# Patient Record
Sex: Male | Born: 1955
Health system: Southern US, Community
[De-identification: ages and names within clinical notes are randomized; demographics above are authoritative.]

## PROBLEM LIST (undated history)

## (undated) DIAGNOSIS — G2581 Restless legs syndrome: Secondary | ICD-10-CM

## (undated) DIAGNOSIS — D689 Coagulation defect, unspecified: Secondary | ICD-10-CM

## (undated) DIAGNOSIS — K219 Gastro-esophageal reflux disease without esophagitis: Secondary | ICD-10-CM

## (undated) DIAGNOSIS — I2510 Atherosclerotic heart disease of native coronary artery without angina pectoris: Secondary | ICD-10-CM

## (undated) DIAGNOSIS — E785 Hyperlipidemia, unspecified: Secondary | ICD-10-CM

## (undated) DIAGNOSIS — D6800 Von Willebrand disease, unspecified: Secondary | ICD-10-CM

## (undated) DIAGNOSIS — T7840XA Allergy, unspecified, initial encounter: Secondary | ICD-10-CM

## (undated) DIAGNOSIS — H905 Unspecified sensorineural hearing loss: Secondary | ICD-10-CM

## (undated) DIAGNOSIS — D649 Anemia, unspecified: Secondary | ICD-10-CM

## (undated) DIAGNOSIS — F419 Anxiety disorder, unspecified: Secondary | ICD-10-CM

## (undated) DIAGNOSIS — I10 Essential (primary) hypertension: Secondary | ICD-10-CM

## (undated) DIAGNOSIS — C44311 Basal cell carcinoma of skin of nose: Secondary | ICD-10-CM

## (undated) DIAGNOSIS — Z8719 Personal history of other diseases of the digestive system: Secondary | ICD-10-CM

## (undated) DIAGNOSIS — G4700 Insomnia, unspecified: Secondary | ICD-10-CM

## (undated) HISTORY — DX: Restless legs syndrome: G25.81

## (undated) HISTORY — DX: Hyperlipidemia, unspecified: E78.5

## (undated) HISTORY — DX: Insomnia, unspecified: G47.00

## (undated) HISTORY — DX: Coagulation defect, unspecified: D68.9

## (undated) HISTORY — DX: Atherosclerotic heart disease of native coronary artery without angina pectoris: I25.10

## (undated) HISTORY — PX: COLONOSCOPY: SHX174

## (undated) HISTORY — PX: CIRCUMCISION: SUR203

## (undated) HISTORY — DX: Allergy, unspecified, initial encounter: T78.40XA

## (undated) HISTORY — DX: Anxiety disorder, unspecified: F41.9

## (undated) HISTORY — DX: Unspecified sensorineural hearing loss: H90.5

## (undated) HISTORY — DX: Basal cell carcinoma of skin of nose: C44.311

## (undated) HISTORY — PX: HERNIA REPAIR: SHX51

## (undated) HISTORY — PX: FOOT SURGERY: SHX648

---

## 2000-12-29 ENCOUNTER — Ambulatory Visit (HOSPITAL_COMMUNITY): Admission: RE | Admit: 2000-12-29 | Discharge: 2000-12-29 | Payer: Self-pay | Admitting: Family Medicine

## 2003-07-24 ENCOUNTER — Emergency Department (HOSPITAL_COMMUNITY): Admission: EM | Admit: 2003-07-24 | Discharge: 2003-07-25 | Payer: Self-pay | Admitting: Emergency Medicine

## 2004-02-29 ENCOUNTER — Emergency Department: Payer: Self-pay | Admitting: Unknown Physician Specialty

## 2006-01-02 ENCOUNTER — Ambulatory Visit: Payer: Self-pay | Admitting: Family Medicine

## 2006-05-01 ENCOUNTER — Ambulatory Visit: Payer: Self-pay | Admitting: Family Medicine

## 2006-05-01 LAB — CONVERTED CEMR LAB
Alkaline Phosphatase: 116 units/L (ref 39–117)
Bilirubin, Direct: 0.1 mg/dL (ref 0.0–0.3)
CO2: 30 meq/L (ref 19–32)
Cholesterol: 174 mg/dL (ref 0–200)
Creatinine, Ser: 1 mg/dL (ref 0.4–1.5)
GFR calc Af Amer: 101 mL/min
LDL Cholesterol: 111 mg/dL — ABNORMAL HIGH (ref 0–99)
PSA: 0.81 ng/mL (ref 0.10–4.00)
Potassium: 4.2 meq/L (ref 3.5–5.1)
Sodium: 139 meq/L (ref 135–145)
TSH: 1.99 microintl units/mL (ref 0.35–5.50)
Total Bilirubin: 0.9 mg/dL (ref 0.3–1.2)
Total Protein: 6.4 g/dL (ref 6.0–8.3)
VLDL: 31 mg/dL (ref 0–40)

## 2006-05-05 ENCOUNTER — Ambulatory Visit: Payer: Self-pay | Admitting: Family Medicine

## 2006-05-20 ENCOUNTER — Ambulatory Visit: Payer: Self-pay | Admitting: Family Medicine

## 2008-02-12 LAB — HM COLONOSCOPY

## 2008-12-07 ENCOUNTER — Encounter: Admission: RE | Admit: 2008-12-07 | Discharge: 2008-12-07 | Payer: Self-pay | Admitting: Internal Medicine

## 2010-04-01 ENCOUNTER — Encounter: Payer: Self-pay | Admitting: Internal Medicine

## 2010-04-02 ENCOUNTER — Encounter: Payer: Self-pay | Admitting: Internal Medicine

## 2010-06-13 ENCOUNTER — Emergency Department (HOSPITAL_COMMUNITY)
Admission: EM | Admit: 2010-06-13 | Discharge: 2010-06-13 | Disposition: A | Discharge status: Home / Self Care | Payer: No Typology Code available for payment source | Attending: Emergency Medicine | Admitting: Emergency Medicine

## 2010-06-13 ENCOUNTER — Emergency Department (HOSPITAL_COMMUNITY): Payer: No Typology Code available for payment source

## 2010-06-13 DIAGNOSIS — M542 Cervicalgia: Secondary | ICD-10-CM | POA: Insufficient documentation

## 2010-06-13 DIAGNOSIS — M549 Dorsalgia, unspecified: Secondary | ICD-10-CM | POA: Insufficient documentation

## 2010-06-13 DIAGNOSIS — S335XXA Sprain of ligaments of lumbar spine, initial encounter: Secondary | ICD-10-CM | POA: Insufficient documentation

## 2010-06-13 DIAGNOSIS — S139XXA Sprain of joints and ligaments of unspecified parts of neck, initial encounter: Secondary | ICD-10-CM | POA: Insufficient documentation

## 2010-06-13 DIAGNOSIS — K219 Gastro-esophageal reflux disease without esophagitis: Secondary | ICD-10-CM | POA: Insufficient documentation

## 2010-06-13 DIAGNOSIS — I10 Essential (primary) hypertension: Secondary | ICD-10-CM | POA: Insufficient documentation

## 2010-07-27 NOTE — Consult Note (Signed)
NAME:  Grant [name], Grant [name] NO.:  [id number]   MEDICAL RECORD NO.:  [id number]                   PATIENT TYPE:  EMS   LOCATION:  [facility]                                 FACILITY:  [facility]   PHYSICIAN:  Titus Dubin. Alwyn Ren, M.D. [facility]         DATE OF BIRTH:  1955/06/12   DATE OF CONSULTATION:  07/25/2003  DATE OF DISCHARGE:                                   CONSULTATION   REFERRING PHYSICIAN:  Dr. Hassan Buckler. Caporossi.   REASON FOR CONSULTATION:  Mr. Voiles is a 55 year old white male seen in  consultation for gastroenteritis symptoms.   On the evening of Jul 20, 2003, he became queasy approximately 8 p.m.  On  the 12th, he had nausea and vomiting x1.  He states that it was profound and  emptied my system.  He is unsure whether he had fever, but he did feel hot  and cold.  On Jul 22, 2003, he stuck his finger down his throat to vomit  because of the profound nausea.  He has had no vomiting since that time and  there have only been 2 episodes of vomiting.  He has had gas and watery  diarrheal stools but no bowel movement since Jul 24, 2003 at lunch.  Unfortunately, he treated his symptoms with Maalox with 3 doses over the  span of Jul 22, 2003 to Jul 23, 2003 for dyspepsia.  He also took over-the-  counter Tylenol.   He does have some dyspnea intermittently and takes Pepcid a.c.  He has  residual soreness at this time, which is positional.  If he is in the  lateral decubitus position, the pain is localized; if he is on his back, it  is diffuse.   He questioned food poisoning.  He ate breakfast at [facility] on Jul 20, 2003 at approximately 6:30, ingestion of a breakfast burrito.  He is unsure  of what he ate at lunch at work; it may have been salad and/or pizza; he  works at [employer].  He ate at [facility] at 6 p.m.,  ingesting catfish, shrimp and [employer].  His symptoms began approximately 2  hours after his evening meal, Jul 20, 2003.   HABITS:  He does not drink or smoke.   ALLERGIES:  He has no known drug allergies.   SOCIAL HISTORY:  He lives with his wife, who is well.  No other family or  friends are sick.  He has no significant travel exposures and has no sick  pets.  He has taken no antibiotics in the last 6 weeks.  He does have well  water, which he drinks occasionally.   PAST MEDICAL HISTORY:  Past medical history includes post-circumcision  bleeding while stationed in [place].  He was hospitalized at age 55 with a  diarrheal illness.   FAMILY HISTORY:  Family history is noncontributory.   REVIEW  OF SYSTEMS:  He has had some dull nonspecific headache with this  illness.   He has had a nonproductive cough.  He has had no genitourinary symptoms.   PHYSICAL EXAMINATION:  GENERAL/VITAL SIGNS:  At this time, he is resting  quietly with a temperature of 98, respiratory rate of 14 and pulse of 65.  Initially, his blood pressure was 140/95 but on recheck, was 113/79.  O2  saturations were 95% on room air.  HEENT:  He has no scleral icterus and there is no jaundice.  Tissue turgor  is good.  Oral mucosa is well-hydrated.  ENT exam is unremarkable.  NODES:  He had no lymphadenopathy about the head, neck or axilla.  He was  minimally tender in the left axilla but no lymphadenopathy was palpable.  CHEST:  Chest is clear.  CARDIAC:  He has a slow S4 with a grade 1/2 systolic murmur.  ABDOMEN:  Bowel sounds are present.  There is no guarding but he is slightly  tender diffusely.  EXTREMITIES:  Pedal pulses are intact.   LABORATORY DATA:  BMET and CBC are normal; his hematocrit is low-normal at  39.7.   ASSESSMENT AND PLAN:  The time interval does not suggest food poisoning and  the working diagnosis would be gastroenteritis.   CAT scan of the abdomen and pelvis will be performed because of the  tenderness to palpation.  If this is negative, then he will be discharged on  clear liquids with Phenergan  suppositories for nausea and Nexium 40 mg each  morning.  Stool for ova and parasite, Salmonella and Shigella will also be  collected.  Clostridium difficile is not likely, due to the fact that he has  not had antibiotics.   He will also be given Lomotil as needed for diarrhea.   He has not seen Dr. Hetty Ely for over 2 years and was only able to identify  him after I mentioned the name.  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                                               Titus Dubin. Alwyn Ren, M.D. [facility]    [facility]/MED[facility]  D:  07/25/2003  T:  07/25/2003  Job:  [id number]   cc:   Laurita Quint, M.D.  [address] [place]  [place] [address]  Fax: [phone]

## 2013-02-04 ENCOUNTER — Encounter (HOSPITAL_COMMUNITY): Admitting: Critical Care Medicine

## 2013-02-04 ENCOUNTER — Encounter (HOSPITAL_COMMUNITY): Payer: Self-pay | Admitting: Emergency Medicine

## 2013-02-04 ENCOUNTER — Emergency Department (HOSPITAL_COMMUNITY): Admitting: Critical Care Medicine

## 2013-02-04 ENCOUNTER — Encounter (HOSPITAL_COMMUNITY)
Admission: EM | Disposition: A | Discharge status: Home / Self Care | Payer: Self-pay | Source: Home / Self Care | Attending: Emergency Medicine

## 2013-02-04 ENCOUNTER — Emergency Department (HOSPITAL_COMMUNITY)
Admission: EM | Admit: 2013-02-04 | Discharge: 2013-02-04 | Disposition: A | Discharge status: Home / Self Care | Attending: Emergency Medicine | Admitting: Emergency Medicine

## 2013-02-04 ENCOUNTER — Emergency Department (HOSPITAL_COMMUNITY)

## 2013-02-04 DIAGNOSIS — S62309B Unspecified fracture of unspecified metacarpal bone, initial encounter for open fracture: Secondary | ICD-10-CM

## 2013-02-04 DIAGNOSIS — Y33XXXA Other specified events, undetermined intent, initial encounter: Secondary | ICD-10-CM | POA: Insufficient documentation

## 2013-02-04 DIAGNOSIS — S62329B Displaced fracture of shaft of unspecified metacarpal bone, initial encounter for open fracture: Secondary | ICD-10-CM | POA: Insufficient documentation

## 2013-02-04 DIAGNOSIS — S62318B Displaced fracture of base of other metacarpal bone, initial encounter for open fracture: Secondary | ICD-10-CM | POA: Insufficient documentation

## 2013-02-04 DIAGNOSIS — I10 Essential (primary) hypertension: Secondary | ICD-10-CM | POA: Insufficient documentation

## 2013-02-04 HISTORY — DX: Essential (primary) hypertension: I10

## 2013-02-04 HISTORY — PX: INCISION AND DRAINAGE: SHX5863

## 2013-02-04 HISTORY — DX: Gastro-esophageal reflux disease without esophagitis: K21.9

## 2013-02-04 HISTORY — PX: OPEN REDUCTION INTERNAL FIXATION (ORIF) FINGER WITH RADIAL BONE GRAFT: SHX5666

## 2013-02-04 LAB — CBC WITH DIFFERENTIAL/PLATELET
Basophils Absolute: 0 10*3/uL (ref 0.0–0.1)
Basophils Relative: 0 % (ref 0–1)
Lymphocytes Relative: 26 % (ref 12–46)
MCHC: 35.3 g/dL (ref 30.0–36.0)
Monocytes Absolute: 0.5 10*3/uL (ref 0.1–1.0)
Neutro Abs: 4 10*3/uL (ref 1.7–7.7)
Neutrophils Relative %: 63 % (ref 43–77)
Platelets: 246 10*3/uL (ref 150–400)
RDW: 12.3 % (ref 11.5–15.5)
WBC: 6.4 10*3/uL (ref 4.0–10.5)

## 2013-02-04 LAB — BASIC METABOLIC PANEL
CO2: 25 mEq/L (ref 19–32)
Chloride: 103 mEq/L (ref 96–112)
Creatinine, Ser: 0.81 mg/dL (ref 0.50–1.35)
GFR calc Af Amer: >90 mL/min (ref >90)
Potassium: 3.3 mEq/L — ABNORMAL LOW (ref 3.5–5.1)
Sodium: 140 mEq/L (ref 135–145)

## 2013-02-04 SURGERY — OPEN REDUCTION INTERNAL FIXATION (ORIF) FINGER WITH RADIAL BONE GRAFT
Anesthesia: General | Site: Hand | Laterality: Right | Wound class: Clean

## 2013-02-04 MED ORDER — HYDROMORPHONE HCL PF 1 MG/ML IJ SOLN
INTRAMUSCULAR | Status: AC
  Administered 2013-02-04: 0.5 mg via INTRAVENOUS
  Filled 2013-02-04: qty 1

## 2013-02-04 MED ORDER — FENTANYL CITRATE 0.05 MG/ML IJ SOLN
INTRAMUSCULAR | Status: DC | PRN
  Administered 2013-02-04 (×4): 50 ug via INTRAVENOUS
  Administered 2013-02-04 (×2): 25 ug via INTRAVENOUS

## 2013-02-04 MED ORDER — TETANUS-DIPHTH-ACELL PERTUSSIS 5-2.5-18.5 LF-MCG/0.5 IM SUSP
0.5000 mL | Freq: Once | INTRAMUSCULAR | Status: AC
  Administered 2013-02-04: 0.5 mL via INTRAMUSCULAR
  Filled 2013-02-04: qty 0.5

## 2013-02-04 MED ORDER — SUCCINYLCHOLINE CHLORIDE 20 MG/ML IJ SOLN
INTRAMUSCULAR | Status: DC | PRN
  Administered 2013-02-04: 120 mg via INTRAVENOUS

## 2013-02-04 MED ORDER — PROPOFOL 10 MG/ML IV BOLUS
INTRAVENOUS | Status: DC | PRN
  Administered 2013-02-04: 200 mg via INTRAVENOUS

## 2013-02-04 MED ORDER — OXYCODONE-ACETAMINOPHEN 10-325 MG PO TABS: 1.0000 | ORAL_TABLET | ORAL | Status: DC | PRN

## 2013-02-04 MED ORDER — LACTATED RINGERS IV SOLN
INTRAVENOUS | Status: DC | PRN
  Administered 2013-02-04 (×2): via INTRAVENOUS

## 2013-02-04 MED ORDER — GLYCOPYRROLATE 0.2 MG/ML IJ SOLN
INTRAMUSCULAR | Status: DC | PRN
  Administered 2013-02-04 (×2): 0.2 mg via INTRAVENOUS
  Administered 2013-02-04 (×2): 0.1 mg via INTRAVENOUS

## 2013-02-04 MED ORDER — ONDANSETRON HCL 4 MG/2ML IJ SOLN
INTRAMUSCULAR | Status: DC | PRN
  Administered 2013-02-04: 4 mg via INTRAVENOUS

## 2013-02-04 MED ORDER — OXYCODONE HCL 5 MG/5ML PO SOLN: 5.0000 mg | Freq: Once | ORAL | Status: AC | PRN

## 2013-02-04 MED ORDER — MIDAZOLAM HCL 5 MG/5ML IJ SOLN
INTRAMUSCULAR | Status: DC | PRN
  Administered 2013-02-04: 2 mg via INTRAVENOUS

## 2013-02-04 MED ORDER — FENTANYL CITRATE 0.05 MG/ML IJ SOLN: 50.0000 ug | Freq: Once | INTRAMUSCULAR | Status: DC

## 2013-02-04 MED ORDER — DOCUSATE SODIUM 100 MG PO CAPS: 100.0000 mg | ORAL_CAPSULE | Freq: Two times a day (BID) | ORAL | Status: DC

## 2013-02-04 MED ORDER — DEXTROSE 5 % IV SOLN
2.0000 g | Freq: Once | INTRAVENOUS | Status: AC
  Administered 2013-02-04: 2 g via INTRAVENOUS
  Filled 2013-02-04: qty 20

## 2013-02-04 MED ORDER — OXYCODONE HCL 5 MG PO TABS
5.0000 mg | ORAL_TABLET | Freq: Once | ORAL | Status: AC | PRN
  Administered 2013-02-04: 5 mg via ORAL

## 2013-02-04 MED ORDER — BUPIVACAINE HCL 0.25 % IJ SOLN
INTRAMUSCULAR | Status: DC | PRN
  Administered 2013-02-04: 10 mL

## 2013-02-04 MED ORDER — PROMETHAZINE HCL 25 MG/ML IJ SOLN: 6.2500 mg | INTRAMUSCULAR | Status: DC | PRN

## 2013-02-04 MED ORDER — CEPHALEXIN 250 MG PO CAPS: 500.0000 mg | ORAL_CAPSULE | Freq: Four times a day (QID) | ORAL | Status: DC

## 2013-02-04 MED ORDER — SODIUM CHLORIDE 0.9 % IR SOLN
Status: DC | PRN
  Administered 2013-02-04: 3000 mL

## 2013-02-04 MED ORDER — HYDROMORPHONE HCL PF 1 MG/ML IJ SOLN
0.2500 mg | INTRAMUSCULAR | Status: DC | PRN
  Administered 2013-02-04 (×2): 0.5 mg via INTRAVENOUS

## 2013-02-04 MED ORDER — OXYCODONE HCL 5 MG PO TABS
ORAL_TABLET | ORAL | Status: AC
  Filled 2013-02-04: qty 1

## 2013-02-04 MED ORDER — MIDAZOLAM HCL 2 MG/2ML IJ SOLN: 1.0000 mg | INTRAMUSCULAR | Status: DC | PRN

## 2013-02-04 MED ORDER — LIDOCAINE HCL (CARDIAC) 20 MG/ML IV SOLN
INTRAVENOUS | Status: DC | PRN
  Administered 2013-02-04: 100 mg via INTRAVENOUS

## 2013-02-04 SURGICAL SUPPLY — 86 items
BANDAGE CONFORM 2  STR LF (GAUZE/BANDAGES/DRESSINGS) IMPLANT
BANDAGE ELASTIC 3 VELCRO ST LF (GAUZE/BANDAGES/DRESSINGS) ×3 IMPLANT
BANDAGE ELASTIC 4 VELCRO ST LF (GAUZE/BANDAGES/DRESSINGS) ×1 IMPLANT
BANDAGE GAUZE ELAST BULKY 4 IN (GAUZE/BANDAGES/DRESSINGS) ×3 IMPLANT
BIT DRILL 1.1 MINI QC NONSTRL (BIT) ×1 IMPLANT
BNDG CMPR 9X4 STRL LF SNTH (GAUZE/BANDAGES/DRESSINGS) ×1
BNDG CMPR MD 5X2 ELC HKLP STRL (GAUZE/BANDAGES/DRESSINGS)
BNDG COHESIVE 1X5 TAN STRL LF (GAUZE/BANDAGES/DRESSINGS) IMPLANT
BNDG ELASTIC 2 VLCR STRL LF (GAUZE/BANDAGES/DRESSINGS) ×1 IMPLANT
BNDG ESMARK 4X9 LF (GAUZE/BANDAGES/DRESSINGS) ×2 IMPLANT
CAP PIN ORTHO PINK (CAP) IMPLANT
CAP PIN PROTECTOR ORTHO WHT (CAP) IMPLANT
CLOTH BEACON ORANGE TIMEOUT ST (SAFETY) ×2 IMPLANT
CORDS BIPOLAR (ELECTRODE) ×2 IMPLANT
COVER SURGICAL LIGHT HANDLE (MISCELLANEOUS) ×2 IMPLANT
CUFF TOURNIQUET SINGLE 18IN (TOURNIQUET CUFF) ×2 IMPLANT
CUFF TOURNIQUET SINGLE 24IN (TOURNIQUET CUFF) IMPLANT
DRAIN PENROSE 1/4X12 LTX STRL (WOUND CARE) IMPLANT
DRAPE OEC MINIVIEW 54X84 (DRAPES) IMPLANT
DRAPE SURG 17X23 STRL (DRAPES) ×2 IMPLANT
DRSG ADAPTIC 3X8 NADH LF (GAUZE/BANDAGES/DRESSINGS) ×2 IMPLANT
DRSG EMULSION OIL 3X3 NADH (GAUZE/BANDAGES/DRESSINGS) ×1 IMPLANT
ELECT REM PT RETURN 9FT ADLT (ELECTROSURGICAL)
ELECTRODE REM PT RTRN 9FT ADLT (ELECTROSURGICAL) IMPLANT
GAUZE SPONGE 2X2 8PLY STRL LF (GAUZE/BANDAGES/DRESSINGS) IMPLANT
GAUZE XEROFORM 1X8 LF (GAUZE/BANDAGES/DRESSINGS) ×2 IMPLANT
GAUZE XEROFORM 5X9 LF (GAUZE/BANDAGES/DRESSINGS) IMPLANT
GLOVE BIOGEL PI IND STRL 8.5 (GLOVE) ×1 IMPLANT
GLOVE BIOGEL PI INDICATOR 8.5 (GLOVE) ×1
GLOVE SURG ORTHO 8.0 STRL STRW (GLOVE) ×2 IMPLANT
GOWN PREVENTION PLUS XLARGE (GOWN DISPOSABLE) ×2 IMPLANT
GOWN STRL NON-REIN LRG LVL3 (GOWN DISPOSABLE) ×6 IMPLANT
HANDPIECE INTERPULSE COAX TIP (DISPOSABLE)
K-WIRE DBL TRONS .035X6 (WIRE) ×4
K-WIRE SMTH SNGL TROCAR .028X4 (WIRE)
KIT BASIN OR (CUSTOM PROCEDURE TRAY) ×2 IMPLANT
KIT ROOM TURNOVER OR (KITS) ×2 IMPLANT
KWIRE DBL TRONS .035X6 (WIRE) IMPLANT
KWIRE SMTH SNGL TROCAR .028X4 (WIRE) IMPLANT
LOCK SCREW 1.5X15MM (Screw) ×4 IMPLANT
MANIFOLD NEPTUNE II (INSTRUMENTS) ×2 IMPLANT
NDL HYPO 25GX1X1/2 BEV (NEEDLE) IMPLANT
NEEDLE HYPO 25GX1X1/2 BEV (NEEDLE) ×2 IMPLANT
NS IRRIG 1000ML POUR BTL (IV SOLUTION) ×2 IMPLANT
PACK ORTHO EXTREMITY (CUSTOM PROCEDURE TRAY) ×2 IMPLANT
PAD ARMBOARD 7.5X6 YLW CONV (MISCELLANEOUS) ×4 IMPLANT
PAD CAST 3X4 CTTN HI CHSV (CAST SUPPLIES) IMPLANT
PAD CAST 4YDX4 CTTN HI CHSV (CAST SUPPLIES) ×1 IMPLANT
PADDING CAST COTTON 3X4 STRL (CAST SUPPLIES) ×2
PADDING CAST COTTON 4X4 STRL (CAST SUPPLIES) ×2
PADDING UNDERCAST 2  STERILE (CAST SUPPLIES) ×2 IMPLANT
PLATE STRAIGHT LOCK 1.5 (Plate) ×1 IMPLANT
PLATE T CONT 1.5MM LOCKING (Plate) ×1 IMPLANT
SCREW 1.5X15MM (Screw) ×1 IMPLANT
SCREW L 1.5X14 (Screw) ×1 IMPLANT
SCREW LOCK 1.5X15MM (Screw) IMPLANT
SCREW LOCKING 1.5X10 (Screw) ×1 IMPLANT
SCREW LOCKING 1.5X11MM (Screw) ×1 IMPLANT
SCREW LOCKING 1.5X16 (Screw) ×1 IMPLANT
SCREW LOCKING 1.5X8 (Screw) ×1 IMPLANT
SCREW NL 1.5X11 WRIST (Screw) ×3 IMPLANT
SCREW NL 1.5X12 (Screw) ×1 IMPLANT
SCREW NL 1.5X13 (Screw) ×1 IMPLANT
SCREW NONIOC 1.5 10M (Screw) ×2 IMPLANT
SCREW NONIOC 1.5 14M (Screw) ×1 IMPLANT
SET HNDPC FAN SPRY TIP SCT (DISPOSABLE) IMPLANT
SOAP 2 % CHG 4 OZ (WOUND CARE) ×2 IMPLANT
SPONGE GAUZE 2X2 STER 10/PKG (GAUZE/BANDAGES/DRESSINGS)
SPONGE GAUZE 4X4 12PLY (GAUZE/BANDAGES/DRESSINGS) ×2 IMPLANT
SPONGE LAP 18X18 X RAY DECT (DISPOSABLE) ×2 IMPLANT
SPONGE LAP 4X18 X RAY DECT (DISPOSABLE) ×2 IMPLANT
SUCTION FRAZIER TIP 10 FR DISP (SUCTIONS) ×2 IMPLANT
SUT ETHILON 4 0 PS 2 18 (SUTURE) IMPLANT
SUT ETHILON 5 0 P 3 18 (SUTURE)
SUT MERSILENE 4 0 P 3 (SUTURE) IMPLANT
SUT NYLON ETHILON 5-0 P-3 1X18 (SUTURE) ×1 IMPLANT
SUT PROLENE 4 0 PS 2 18 (SUTURE) ×1 IMPLANT
SUT VIC AB 4-0 PS2 27 (SUTURE) ×1 IMPLANT
SYR CONTROL 10ML LL (SYRINGE) ×1 IMPLANT
TOWEL OR 17X24 6PK STRL BLUE (TOWEL DISPOSABLE) ×2 IMPLANT
TOWEL OR 17X26 10 PK STRL BLUE (TOWEL DISPOSABLE) ×2 IMPLANT
TUBE ANAEROBIC SPECIMEN COL (MISCELLANEOUS) IMPLANT
TUBE CONNECTING 12X1/4 (SUCTIONS) ×2 IMPLANT
UNDERPAD 30X30 INCONTINENT (UNDERPADS AND DIAPERS) ×2 IMPLANT
WATER STERILE IRR 1000ML POUR (IV SOLUTION) ×2 IMPLANT
YANKAUER SUCT BULB TIP NO VENT (SUCTIONS) ×2 IMPLANT

## 2013-02-04 NOTE — ED Notes (Signed)
Report called to [facility] in OR, pt to be transported up and evaluated by hand surgeon upon arrival to holding area, OR aware patient has not received antibiotic, consent has not been signed.

## 2013-02-04 NOTE — Preoperative (Signed)
Beta Blockers   Reason not to administer Beta Blockers:Not Applicable 

## 2013-02-04 NOTE — ED Notes (Signed)
Pt to OR.

## 2013-02-04 NOTE — Brief Op Note (Signed)
02/04/2013  12:35 PM  PATIENT:  Grant [name]  57 y.o. male  PRE-OPERATIVE DIAGNOSIS:  RIGHT RING AND SMALL FINGER METACARPAL FRACTURES  POST-OPERATIVE DIAGNOSIS:  SAME  PROCEDURE:  Procedure(s): OPEN REDUCTION INTERNAL FIXATION (ORIF) right ring and small fingers (Right) INCISION AND DRAINAGE (Right)  SURGEON:  Surgeon(s) and Role:    * Sharma Covert, MD - Primary  PHYSICIAN ASSISTANT: NONE  ASSISTANTS: none NONE  ANESTHESIA:   generalGENERAL  EBL:   MINIMAL  BLOOD ADMINISTERED:noneNONE  DRAINS: none NONE  LOCAL MEDICATIONS USED:  MARCAINE   MARCAINE  SPECIMEN:  No SpecimenNONE  DISPOSITION OF SPECIMEN:  N/ANONE  COUNTS:  YESYES  TOURNIQUET:  * No tourniquets in log *NONE  DICTATION: .[id number]  PLAN OF CARE: Discharge to home after [facility]  PATIENT DISPOSITION:  PACU - hemodynamically stable.HOME   Delay start of Pharmacological VTE agent (>24hrs) due to surgical blood loss or risk of bleeding: not applicableN/A

## 2013-02-04 NOTE — Anesthesia Preprocedure Evaluation (Addendum)
Anesthesia Evaluation  Patient identified by MRN, date of birth, ID band Patient awake    Reviewed: Allergy & Precautions, H&P , NPO status , Patient's Chart, lab work & pertinent test results, reviewed documented beta blocker date and time   Airway Mallampati: II TM Distance: >3 FB Neck ROM: Full    Dental  (+) Dental Advisory Given   Pulmonary  breath sounds clear to auscultation        Cardiovascular hypertension, Pt. on home beta blockers and Pt. on medications Rhythm:Regular Rate:Normal     Neuro/Psych    GI/Hepatic GERD-  Medicated,  Endo/Other    Renal/GU      Musculoskeletal   Abdominal   Peds  Hematology   Anesthesia Other Findings   Reproductive/Obstetrics                         Anesthesia Physical Anesthesia Plan  ASA: II and emergent  Anesthesia Plan: General   Post-op Pain Management:    Induction: Intravenous, Rapid sequence and Cricoid pressure planned  Airway Management Planned: Oral ETT  Additional Equipment:   Intra-op Plan:   Post-operative Plan: Extubation in OR  Informed Consent: I have reviewed the patients History and Physical, chart, labs and discussed the procedure including the risks, benefits and alternatives for the proposed anesthesia with the patient or authorized representative who has indicated his/her understanding and acceptance.   Dental advisory given  Plan Discussed with: Surgeon and CRNA  Anesthesia Plan Comments:       Anesthesia Quick Evaluation

## 2013-02-04 NOTE — ED Notes (Signed)
Called pharmacy regarding delay in receiving medication, states they will send it soon.

## 2013-02-04 NOTE — Anesthesia Procedure Notes (Signed)
Procedure Name: Intubation Date/Time: 02/04/2013 12:47 PM Performed by: Elon Alas Pre-anesthesia Checklist: Patient identified, Timeout performed, Emergency Drugs available, Suction available and Patient being monitored Patient Re-evaluated:Patient Re-evaluated prior to inductionOxygen Delivery Method: Circle system utilized Preoxygenation: Pre-oxygenation with 100% oxygen Intubation Type: IV induction, Rapid sequence and Cricoid Pressure applied Laryngoscope Size: Mac and 3 Grade View: Grade I Tube type: Oral Tube size: 7.5 mm Number of attempts: 1 Airway Equipment and Method: Stylet Placement Confirmation: positive ETCO2,  breath sounds checked- equal and bilateral and ETT inserted through vocal cords under direct vision Secured at: 23 cm Tube secured with: Tape Dental Injury: Teeth and Oropharynx as per pre-operative assessment

## 2013-02-04 NOTE — Transfer of Care (Signed)
Immediate Anesthesia Transfer of Care Note  Patient: Grant [name]  Procedure(s) Performed: Procedure(s): OPEN REDUCTION INTERNAL FIXATION (ORIF) right ring and small fingers (Right) INCISION AND DRAINAGE (Right)  Patient Location: PACU  Anesthesia Type:General  Level of Consciousness: oriented and patient cooperative  Airway & Oxygen Therapy: Patient Spontanous Breathing and Patient connected to face mask oxygen  Post-op Assessment: Report given to PACU RN and Post -op Vital signs reviewed and stable  Post vital signs: Reviewed and stable  Complications: No apparent anesthesia complications

## 2013-02-04 NOTE — H&P (Signed)
Grant [name] is an 57 y.o. male.   Chief Complaint:RIGHT HAND INJURY HPI:PT HIT WALL SUSTAINED OPEN INJURY TO RIGHT HAND SEEN/EVALUATED IN ED BY STAFF PT WITH OPEN HAND FRACTURES PT HERE FOR SURGERY ON RIGHT HAND NO PRIOR SURGERY TO HAND NO OTHER COMPLAINTS  Past Medical History  Diagnosis Date  . Hypertension   . Acid reflux     No past surgical history on file.  No family history on file. Social History:  reports that he has never smoked. He does not have any smokeless tobacco history on file. He reports that he does not drink alcohol. His drug history is not on file.  Allergies: No Known Allergies   (Not in a hospital admission)  No results found for this or any previous visit (from the past 48 hour(s)). Dg Hand Complete Right  02/04/2013   CLINICAL DATA:  Pain post trauma.  EXAM: RIGHT HAND - COMPLETE 3+ VIEW  COMPARISON:  None.  FINDINGS: There is a displaced transverse fracture of the base of the 5th metacarpal with volar angulation of the distal fragment. There is also a displaced fracture over the distal diaphysis of the 4th metacarpal with volar angulation of the distal fragment. Cannot completely exclude a fracture of the base of the 4th metacarpal. There are mild degenerative changes over the radiocarpal joint and distal radial ulnar joint as well as the 1st carpal/metacarpal joint.  IMPRESSION: Displaced transverse fracture over the base of the 5th metacarpal as well as oblique fracture of the distal diaphysis of the 4th metacarpal both with moderate volar angulation of the distal fragments. Cannot completely exclude a fracture of the base of the 4th metacarpal.   Electronically Signed   By: Elberta Fortis M.D.   On: 02/04/2013 12:01    ROS LAST MEAL: LAST NIGHT NO RECENT ILLNESSES OR HOSPITALIZATIONS [employer]IRED [employer] [place] [employer]  Blood pressure 166/93, pulse 84, temperature 97.1 F (36.2 C), temperature source Oral, resp. rate 18, SpO2  97.00%. Physical Exam   General Appearance:  Alert, cooperative, no distress, appears stated age  Head:  Normocephalic, without obvious abnormality, atraumatic  Eyes:  Pupils equal, conjunctiva/corneas clear,         Throat: Lips, mucosa, and tongue normal; teeth and gums normal  Neck: No visible masses     Lungs:   respirations unlabored  Chest Wall:  No tenderness or deformity  Heart:  Regular rate and rhythm,  Abdomen:   Soft, non-tender,         Extremities: RIGHT HAND: +DEFORMITY, <1 CM OPEN WOUND OVER DORSUM OF HAND OVER RING FINGER, FINGERS WARM WELL PERFUSED LIMITED DIGITAL MOTION LIMITED WRIST MOTION  Pulses: 2+ and symmetric  Skin: Skin color, texture, turgor normal, no rashes or lesions     Neurologic: Normal    Assessment/PlanRIGHT RING AND SMALL FINGER METACARPAL FRACTURES, DISPLACED, OPEN  RIGHT HAND OPEN DEBRIDEMENT AND OPEN REDUCTION AND INTERNAL FIXATION  R/B/A DISCUSSED WITH PT IN HOLDING AREA.  PT VOICED UNDERSTANDING OF PLAN CONSENT SIGNED DAY OF SURGERY PT SEEN AND EXAMINED PRIOR TO OPERATIVE PROCEDURE/DAY OF SURGERY SITE MARKED. QUESTIONS ANSWERED WILL GO HOME FOLLOWING SURGERY  Sharma Covert 02/04/2013, 12:32 PM

## 2013-02-04 NOTE — ED Notes (Signed)
rt hand pain after hit a wall out of anger rt hand swollen oozing blood

## 2013-02-04 NOTE — Anesthesia Postprocedure Evaluation (Signed)
  Anesthesia Post-op Note  Patient: Grant [name]  Procedure(s) Performed: Procedure(s): OPEN REDUCTION INTERNAL FIXATION (ORIF) right ring and small fingers (Right) INCISION AND DRAINAGE (Right)  Patient Location: PACU  Anesthesia Type:General  Level of Consciousness: awake  Airway and Oxygen Therapy: Patient Spontanous Breathing  Post-op Pain: mild  Post-op Assessment: Post-op Vital signs reviewed, Patient's Cardiovascular Status Stable, Respiratory Function Stable, Patent Airway, No signs of Nausea or vomiting and Pain level controlled  Post-op Vital Signs: Reviewed and stable  Complications: No apparent anesthesia complications

## 2013-02-04 NOTE — ED Provider Notes (Signed)
CSN: [id number]     Arrival date & time 02/04/13  1125 History   First MD Initiated Contact with Patient 02/04/13 1130     Chief Complaint  Patient presents with  . Hand Pain   (Consider location/radiation/quality/duration/timing/severity/associated sxs/prior Treatment) HPI Comments: Patient presents to the ER for evaluation of hand injury. Patient reports she became upset earlier today and punched the wall multiple times. Patient reports moderate pain in the hand with a laceration on the back of the hand. It reports that the area is oozing blood and it worsens in the hand. No wrist pain.  Patient is a 57 y.o. male presenting with hand pain.  Hand Pain    Past Medical History  Diagnosis Date  . Hypertension   . Acid reflux    No past surgical history on file. No family history on file. History  Substance Use Topics  . Smoking status: Never Smoker   . Smokeless tobacco: Not on file  . Alcohol Use: No    Review of Systems  Musculoskeletal:       Hand pain  Skin: Positive for wound.    Allergies  Review of patient's allergies indicates no known allergies.  Home Medications  No current outpatient prescriptions on file. There were no vitals taken for this visit. Physical Exam  Constitutional: He appears well-developed and well-nourished.  HENT:  Head: Normocephalic and atraumatic.  Eyes: Pupils are equal, round, and reactive to light.  Neck: Normal range of motion.  Cardiovascular: Normal rate and regular rhythm.   Pulmonary/Chest: Effort normal.  Musculoskeletal:       Right hand: He exhibits decreased range of motion, tenderness, bony tenderness, deformity and laceration.       Hands:   ED Course  Procedures (including critical care time) Labs Review Labs Reviewed  BASIC METABOLIC PANEL - Abnormal; Notable for the following:    Potassium 3.3 (*)    All other components within normal limits  CBC WITH DIFFERENTIAL   Imaging Review Dg Hand Complete  Right  02/04/2013   CLINICAL DATA:  Pain post trauma.  EXAM: RIGHT HAND - COMPLETE 3+ VIEW  COMPARISON:  None.  FINDINGS: There is a displaced transverse fracture of the base of the 5th metacarpal with volar angulation of the distal fragment. There is also a displaced fracture over the distal diaphysis of the 4th metacarpal with volar angulation of the distal fragment. Cannot completely exclude a fracture of the base of the 4th metacarpal. There are mild degenerative changes over the radiocarpal joint and distal radial ulnar joint as well as the 1st carpal/metacarpal joint.  IMPRESSION: Displaced transverse fracture over the base of the 5th metacarpal as well as oblique fracture of the distal diaphysis of the 4th metacarpal both with moderate volar angulation of the distal fragments. Cannot completely exclude a fracture of the base of the 4th metacarpal.   Electronically Signed   By: Elberta Fortis M.D.   On: 02/04/2013 12:01    EKG Interpretation    Date/Time:  Thursday February 04 2013 13:01:05 EST Ventricular Rate:  52 PR Interval:  167 QRS Duration: 107 QT Interval:  443 QTC Calculation: 412 R Axis:   33 Text Interpretation:  Sinus rhythm Normal ECG Confirmed by Aalayah Riles  MD, Kendalyn Cranfield ([id number]) on 02/04/2013 1:06:41 PM            MDM  Diagnosis: Open fourth metacarpal fracture, fifth metacarpal fracture right hand  Patient presents to the ER with right hand injury after punching  a wall. Patient had obvious deformity at the fourth metacarpal region with generalized tenderness and swelling. There was a laceration over the distal portion of the fourth metacarpal. This does coincide with the pointed area of bone secondary to oblique fracture of the fourth metacarpal. This is consistent with an open fracture. Case discussed with Doctor Melvyn Novas. Patient will be brought to the OR for further treatment. Patient administered tetanus, Ancef was ordered but not available from the pharmacy before  patient was brought to the OR. Ancef will be brought to the OR for administration. Patient declined analgesia here in the ER.    Gilda Crease, MD 02/04/13 1330

## 2013-02-04 NOTE — OR Nursing (Signed)
Parents phone # is [phone] and Elie Confer (wife) # is [phone]

## 2013-02-05 NOTE — Op Note (Signed)
NAME:  Grant [name], RINGER NO.:  [id number]  MEDICAL RECORD NO.:  [id number]  LOCATION:  [facility]                         FACILITY:  [facility]  PHYSICIAN:  Madelynn Done, MD  DATE OF BIRTH:  11-03-1955  DATE OF PROCEDURE:  02/04/2013 DATE OF DISCHARGE:  02/04/2013                              OPERATIVE REPORT   PREOPERATIVE DIAGNOSIS:  Right ring finger and small finger open metacarpal fracture.  Grade 1 open fractures.  POSTOPERATIVE DIAGNOSIS:  Right ring finger and small finger open metacarpal fracture.  Grade 1 open fractures.  ATTENDING PHYSICIAN:  Madelynn Done, MD, who scrubbed and present for the entire procedure.  ASSISTANT SURGEON:  None.  ANESTHESIA:  General via LMA.  TOURNIQUET TIME:  Less than 2 hours at 250 mmHg.  SURGICAL IMPLANTS:  DePuy hand ALPS straight plate for the ring finger metacarpal and DePuy hand ALPS small tear T-plate for the small finger metacarpal base fracture.  SURGICAL PROCEDURE: 1. Debridement of skin, subcutaneous tissue, and bone associated with     open fracture, right ring finger and small finger. 2. Open treatment of right ring finger metacarpal shaft fracture     requiring internal fixation. 3. Open treatment of right small finger metacarpal base fracture     involving the CMC joint, requiring internal fixation. 4. Radiograph, 3 views of the right hand.  SURGICAL INDICATIONS:  Mr. Gagen is a 57 year old right-hand-dominant gentleman, who punched a stationary object sustaining the open injuries to the ring and small finger metacarpal shaft.  Patient had grade 1 focal injuries to both the ring and the small fingers.  The patient was seen and evaluated in the emergency department recommend to undergo the above procedure.  Risks, benefits, and alternatives were discussed in detail with the patient and signed informed consent was obtained.  Risks include, but not limited to bleeding, infection, damage to  nearby nerves, arteries, or tendons, loss of motion of the elbow wrist and digits, nonunion, malunion, hardware, failure, and need for further surgical intervention.  The patient signed informed consent was obtained.  DESCRIPTION OF PROCEDURE:  The patient was properly identified in the preop holding area and marked with a permanent marker made on the right hand to indicate the correct operative site.  The patient was then brought back to the operating room placed supine on the anesthesia room table.  General anesthesia was administered.  The patient tolerated this well.  A well-padded tourniquet was then placed on the right brachium and sealed with 1000 drape.  The right upper extremity was then prepped and draped in normal sterile fashion.  Time-out was called, correct side was identified, and procedure then begun.  Attention then turned to the right hand where longitudinal incision was made directly between the ring and small finger metacarpal shafts.  Dissection was then carried down through the skin and subcutaneous tissues exposing the ring finger metacarpal shaft.  Dissection was then carried down through the skin, subcutaneous tissue exposing the ring finger metacarpal shaft.  Open debridement of skin, subcutaneous tissue, was then carried out of the bone, excisional debridement was then carried out of the open fracture site.  The wound was then thoroughly irrigated.  An open reduction was then performed. The fracture site reduced.  The 7 hole plate was then applied over the dorsal aspect of the metacarpal.  There is 1.5 mm screws combination of locking and nonlocking screws were then used with a good purchase along the opposite engaging cortices.  The wound was then thoroughly irrigated.  Copious wound irrigation done throughout after internal fixation of the ring finger, shaft fracture.  Attention was then turned to the small finger metacarpal shaft.  The patient's fracture  did involve the articular surface of the [facility] joint, through the same incision dissection was then carried down through the skin.  The fascia was then incised longitudinally.  The fracture site was then exposed. Reduction was then performed and held in place manually and the T-plate was then applied using the combination of locking and nonlocking screws. 1.5 mm T-plate was then applied and reducing the articular fracture segment and then reducing it to the shaft.  The wounds were then thoroughly irrigated.  After final fixation, radiographs were then obtained of all 3 planes.  Copious wound irrigation done.  The fascia on the wounds were then closed with 3-0 Vicryl.  Subcutaneous tissue was closed with 4-0 Vicryl, and the skin closed with a running 4-0 Prolene. A 10 mL 0.25% Marcaine infiltrated locally.  Sterile compressive bandage was then applied.  The patient tolerated the procedure well.  Placed in a volar splint, extubated, and taken to recovery room in good condition.  Intraoperative radiographs 3 views of the hand did show the internal fixation in place.  There was good position in both planes.  POSTPROCEDURE PLAN:  The patient was discharged to home, seen back in the office in approximately 10 days for wound check, suture removal, x- rays, and then begin an outpatient therapy regimen per protocol for ORIF with plate and screw construct.     Madelynn Done, MD     [facility]/MEDQ  D:  02/04/2013  T:  02/04/2013  Job:  [id number]

## 2013-02-09 ENCOUNTER — Encounter (HOSPITAL_COMMUNITY): Payer: Self-pay | Admitting: Orthopedic Surgery

## 2013-08-21 ENCOUNTER — Ambulatory Visit (INDEPENDENT_AMBULATORY_CARE_PROVIDER_SITE_OTHER): Admitting: Family Medicine

## 2013-08-21 VITALS — BP 130/68 | HR 70 | Temp 97.7°F | Resp 14 | Ht 69.0 in | Wt 188.8 lb

## 2013-08-21 DIAGNOSIS — R059 Cough, unspecified: Secondary | ICD-10-CM

## 2013-08-21 DIAGNOSIS — J029 Acute pharyngitis, unspecified: Secondary | ICD-10-CM

## 2013-08-21 DIAGNOSIS — R05 Cough: Secondary | ICD-10-CM

## 2013-08-21 DIAGNOSIS — I889 Nonspecific lymphadenitis, unspecified: Secondary | ICD-10-CM

## 2013-08-21 LAB — POCT CBC
Granulocyte percent: 80.4 %G — AB (ref 37–80)
HCT, POC: 45.8 % (ref 43.5–53.7)
Hemoglobin: 14.9 g/dL (ref 14.1–18.1)
Lymph, poc: 1.6 (ref 0.6–3.4)
MCH, POC: 30.1 pg (ref 27–31.2)
MCHC: 32.5 g/dL (ref 31.8–35.4)
MCV: 92.6 fL (ref 80–97)
MID (CBC): 0.7 (ref 0–0.9)
MPV: 7.9 fL (ref 0–99.8)
POC Granulocyte: 9.6 — AB (ref 2–6.9)
POC LYMPH %: 13.6 % (ref 10–50)
POC MID %: 6 %M (ref 0–12)
Platelet Count, POC: 331 10*3/uL (ref 142–424)
RBC: 4.95 M/uL (ref 4.69–6.13)
RDW, POC: 13 %
WBC: 12 10*3/uL — AB (ref 4.6–10.2)

## 2013-08-21 LAB — POCT RAPID STREP A (OFFICE): RAPID STREP A SCREEN: NEGATIVE

## 2013-08-21 MED ORDER — CEFDINIR 300 MG PO CAPS: 600.0000 mg | ORAL_CAPSULE | Freq: Every day | ORAL | Status: DC

## 2013-08-21 MED ORDER — MAGIC MOUTHWASH W/LIDOCAINE: 10.0000 mL | ORAL | Status: DC | PRN

## 2013-08-21 NOTE — Patient Instructions (Addendum)
Drink plenty of fluids  Use the Magic mouthwash as directed for throat pain  Take the Omnicef antibiotic one twice daily  Return if worse

## 2013-08-21 NOTE — Progress Notes (Signed)
Subjective: 58 year old man who generally is in good health. He takes medicine for his blood pressure and acid reflux. He takes a beta blocker for blood pressure. He does not have his medicines with him the names of them. He is not a recent smoker though he has smoked off and on. 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 He started feeling bad about a week ago. He has a sore throat. Not much in the way of a runny nose. His neck hurts him. He has tender glands but the back of the neck also seems to hurt him. He and coughing and the cough is getting worse. He is taking cough drops without relief. He is married but his wife has not been sick and he cannot relate this to being around anyone else has been ill.  Objective: Pleasant alert gentleman who doesn't feel well. He has a tight sounding cough. His years of her mobile but. TMs are essentially normal though the right is a slight bit dull and in the left. His throat is erythematous down the back of his throat without any exudate. His neck is tender. He has moderately large anterior cervical nodes and one just behind the sternocleidomastoid on the left it is tender. Chest is clear to auscultation. Heart regular without murmurs. No axillary or inguinal moment. Abdomen is soft with minimal nonspecific tenderness.  Assessment: Pharyngitis, lymphadenitis, and cough  Plan: CBC, strep test and culture if needed.  Results for orders placed in visit on 08/21/13  POCT CBC      Result Value Ref Range   WBC 12.0 (*) 4.6 - 10.2 K/uL   Lymph, poc 1.6  0.6 - 3.4   POC LYMPH PERCENT 13.6  10 - 50 %L   MID (cbc) 0.7  0 - 0.9   POC MID % 6.0  0 - 12 %M   POC Granulocyte 9.6 (*) 2 - 6.9   Granulocyte percent 80.4 (*) 37 - 80 %G   RBC 4.95  4.69 - 6.13 M/uL   Hemoglobin 14.9  14.1 - 18.1 g/dL   HCT, POC 45.8  43.5 - 53.7 %   MCV 92.6  80 - 97 fL   MCH, POC 30.1  27 - 31.2 pg   MCHC 32.5  31.8 - 35.4 g/dL   RDW, POC 13.0     Platelet Count, POC 331   142 - 424 K/uL   MPV 7.9  0 - 99.8 fL  POCT RAPID STREP A (OFFICE)      Result Value Ref Range   Rapid Strep A Screen Negative  Negative   Strep is negative. Will treat for the cough and sore throat with an antibiotic however because of the elevated white count. He wants something to help numbness throat up a little bit also.  C. instructions.

## 2013-08-23 LAB — CULTURE, GROUP A STREP: Organism ID, Bacteria: NORMAL

## 2013-08-24 ENCOUNTER — Ambulatory Visit (INDEPENDENT_AMBULATORY_CARE_PROVIDER_SITE_OTHER): Admitting: Family Medicine

## 2013-08-24 VITALS — BP 142/80 | HR 70 | Temp 98.3°F | Resp 18 | Ht 69.0 in | Wt 188.0 lb

## 2013-08-24 DIAGNOSIS — T7840XA Allergy, unspecified, initial encounter: Secondary | ICD-10-CM

## 2013-08-24 DIAGNOSIS — R059 Cough, unspecified: Secondary | ICD-10-CM

## 2013-08-24 DIAGNOSIS — L509 Urticaria, unspecified: Secondary | ICD-10-CM

## 2013-08-24 DIAGNOSIS — J029 Acute pharyngitis, unspecified: Secondary | ICD-10-CM

## 2013-08-24 DIAGNOSIS — R05 Cough: Secondary | ICD-10-CM

## 2013-08-24 MED ORDER — AZITHROMYCIN 250 MG PO TABS: ORAL_TABLET | ORAL | Status: DC

## 2013-08-24 MED ORDER — METHYLPREDNISOLONE ACETATE 80 MG/ML IJ SUSP
80.0000 mg | Freq: Once | INTRAMUSCULAR | Status: AC
  Administered 2013-08-24: 80 mg via INTRAMUSCULAR

## 2013-08-24 NOTE — Progress Notes (Signed)
Subjective:    Patient ID: Grant [name], male    DOB: Feb 04, 1956, 58 y.o.   MRN: [id number]  HPI Grant [name] is a 58 y.o. male Seen 3 days ago with sore throat, cough, elevated WBC count, negative rapid strep.  Started on Omnicef. S/p 5 doses - last dose last night. Feeling better regarding cough and sore throat. No fever since last ov.  Noted a few hives on arms and thighs 2 days later, now has spread to other areas/body. No shortness of breath or difficulty swallowing or throat swelling. Has had "stress hives" in past, but these seem to be smaller.   Has not taken any benadryl yet, as has to work to day and needs to work on elevated surfaces today.     Results for orders placed in visit on 08/21/13  CULTURE, GROUP A STREP      Result Value Ref Range   Organism ID, Bacteria Normal Upper Respiratory Flora     Organism ID, Bacteria No Beta Hemolytic Streptococci Isolated    POCT CBC      Result Value Ref Range   WBC 12.0 (*) 4.6 - 10.2 K/uL   Lymph, poc 1.6  0.6 - 3.4   POC LYMPH PERCENT 13.6  10 - 50 %L   MID (cbc) 0.7  0 - 0.9   POC MID % 6.0  0 - 12 %M   POC Granulocyte 9.6 (*) 2 - 6.9   Granulocyte percent 80.4 (*) 37 - 80 %G   RBC 4.95  4.69 - 6.13 M/uL   Hemoglobin 14.9  14.1 - 18.1 g/dL   HCT, POC 45.8  43.5 - 53.7 %   MCV 92.6  80 - 97 fL   MCH, POC 30.1  27 - 31.2 pg   MCHC 32.5  31.8 - 35.4 g/dL   RDW, POC 13.0     Platelet Count, POC 331  142 - 424 K/uL   MPV 7.9  0 - 99.8 fL  POCT RAPID STREP A (OFFICE)      Result Value Ref Range   Rapid Strep A Screen Negative  Negative      There are no active problems to display for this patient.  Past Medical History  Diagnosis Date  . Hypertension   . Acid reflux   . Allergy    Past Surgical History  Procedure Laterality Date  . Open reduction internal fixation (orif) finger with radial bone graft Right 02/04/2013    Procedure: OPEN REDUCTION INTERNAL FIXATION (ORIF) right ring and small fingers;  Surgeon:  Linna Hoff, MD;  Location: [place];  Service: Orthopedics;  Laterality: Right;  . Incision and drainage Right 02/04/2013    Procedure: INCISION AND DRAINAGE;  Surgeon: Linna Hoff, MD;  Location: [place];  Service: Orthopedics;  Laterality: Right;   No Known Allergies Prior to Admission medications   Medication Sig Start Date End Date Taking? Authorizing Provider  Alum & Mag Hydroxide-Simeth (MAGIC MOUTHWASH W/LIDOCAINE) SOLN Take 10 mLs by mouth every 2 (two) hours as needed for mouth pain. 08/21/13  Yes Posey Boyer, MD  AMLODIPINE BESYLATE PO Take 10 mg by mouth.   Yes Historical Provider, MD  cefdinir (OMNICEF) 300 MG capsule Take 2 capsules (600 mg total) by mouth daily. 08/21/13  Yes Posey Boyer, MD  Metoprolol-Hydrochlorothiazide 50-12.5 MG TB24 Take 50 mg by mouth.   Yes Historical Provider, MD  PANTOPRAZOLE SODIUM PO Take 40 mg by mouth.  Yes Historical Provider, MD   History   Social History  . Marital Status: Married    Spouse Name: N/A    Number of Children: N/A  . Years of Education: N/A   Occupational History  . Not on file.   Social History Main Topics  . Smoking status: Former Research scientist (life sciences)  . Smokeless tobacco: Not on file  . Alcohol Use: No  . Drug Use: No  . Sexual Activity: Not on file   Other Topics Concern  . Not on file   Social History Narrative  . No narrative on file    Review of Systems  Constitutional: Negative for fever.  Respiratory: Negative for chest tightness and shortness of breath.   Skin: Positive for rash.   As above.     Objective:   Physical Exam  Vitals reviewed. Constitutional: He is oriented to person, place, and time. He appears well-developed and well-nourished. No distress.  HENT:  Head: Normocephalic and atraumatic.  Right Ear: Tympanic membrane, external ear and ear canal normal.  Left Ear: Tympanic membrane, external ear and ear canal normal.  Nose: No rhinorrhea.  Mouth/Throat: Oropharynx is clear and moist and  mucous membranes are normal. No oropharyngeal exudate or posterior oropharyngeal erythema.  No mucosal lesions.   Eyes: Conjunctivae are normal. Pupils are equal, round, and reactive to light.  Neck: Neck supple.  Cardiovascular: Normal rate, regular rhythm, normal heart sounds and intact distal pulses.   No murmur heard. Pulmonary/Chest: Effort normal and breath sounds normal. He has no wheezes. He has no rhonchi. He has no rales.  Abdominal: Soft. There is no tenderness.  Lymphadenopathy:    He has no cervical adenopathy.  Neurological: He is alert and oriented to person, place, and time.  Skin: Skin is warm and dry. Rash noted. Rash is urticarial (arms, legs, trunk. ).  Psychiatric: He has a normal mood and affect. His behavior is normal.   Filed Vitals:   08/24/13 0812  BP: 142/80  Pulse: 70  Temp: 98.3 F (36.8 C)  TempSrc: Oral  Resp: 18  Height: 5\' 9"  (1.753 m)  Weight: 188 lb (85.276 kg)  SpO2: 98%      Assessment & Plan:   [name] is a 58 y.o. male Allergic reaction - Hives Plan: methylPREDNISolone acetate (DEPO-MEDROL) injection 80 mg, stop omnicef.  Recommended benadryl, but as working on elevated surfaces today - allegra only, then benadryl tonight.  additive side effects of antihistamines discussed. SED/RTC precautions of prednisone discussed.   Sore throat -, Cough - Plan: azithromycin (ZITHROMAX) 250 MG tablet paper rx given if not continuing to improve as throat cx negative - possible viral illness.  rtc precautions discussed.    Meds ordered this encounter  Medications  . Metoprolol-Hydrochlorothiazide 50-12.5 MG TB24    Sig: Take 50 mg by mouth.  . AMLODIPINE BESYLATE PO    Sig: Take 10 mg by mouth.  Marland Kitchen PANTOPRAZOLE SODIUM PO    Sig: Take 40 mg by mouth.  Marland Kitchen azithromycin (ZITHROMAX) 250 MG tablet    Sig: Take 2 pills by mouth on day 1, then 1 pill by mouth per day on days 2 through 5.    Dispense:  6 each    Refill:  0  . methylPREDNISolone acetate  (DEPO-MEDROL) injection 80 mg    Sig:    Patient Instructions  You can try the Allegra one per day if trying to avoid sedation, then benadryl up to every 4-6 hours if needed  for hives if you are not driving or working on elevated surfaces due to sedation with this medicine.  Stop the omnicef, and if sore throat or cough not continuing to improve - start Z pak.  Return to the clinic or go to the nearest emergency room if any of your symptoms worsen or new symptoms occur. Hives Hives are itchy, red, swollen areas of the skin. They can vary in size and location on your body. Hives can come and go for hours or several days (acute hives) or for several weeks (chronic hives). Hives do not spread from person to person (noncontagious). They may get worse with scratching, exercise, and emotional stress. CAUSES   Allergic reaction to food, additives, or drugs.  Infections, including the common cold.  Illness, such as vasculitis, lupus, or thyroid disease.  Exposure to sunlight, heat, or cold.  Exercise.  Stress.  Contact with chemicals. SYMPTOMS   Red or white swollen patches on the skin. The patches may change size, shape, and location quickly and repeatedly.  Itching.  Swelling of the hands, feet, and face. This may occur if hives develop deeper in the skin. DIAGNOSIS  Your caregiver can usually tell what is wrong by performing a physical exam. Skin or blood tests may also be done to determine the cause of your hives. In some cases, the cause cannot be determined. TREATMENT  Mild cases usually get better with medicines such as antihistamines. Severe cases may require an emergency epinephrine injection. If the cause of your hives is known, treatment includes avoiding that trigger.  HOME CARE INSTRUCTIONS   Avoid causes that trigger your hives.  Take antihistamines as directed by your caregiver to reduce the severity of your hives. Non-sedating or low-sedating antihistamines are usually  recommended. Do not drive while taking an antihistamine.  Take any other medicines prescribed for itching as directed by your caregiver.  Wear loose-fitting clothing.  Keep all follow-up appointments as directed by your caregiver. SEEK MEDICAL CARE IF:   You have persistent or severe itching that is not relieved with medicine.  You have painful or swollen joints. SEEK IMMEDIATE MEDICAL CARE IF:   You have a fever.  Your tongue or lips are swollen.  You have trouble breathing or swallowing.  You feel tightness in the throat or chest.  You have abdominal pain. These problems may be the first sign of a life-threatening allergic reaction. Call your local emergency services (911 in [place] MAKE SURE YOU:   Understand these instructions.  Will watch your condition.  Will get help right away if you are not doing well or get worse. Document Released: 02/25/2005 Document Revised: 08/27/2011 Document Reviewed: 05/21/2011 [facility] Patient Information 2014 [place].

## 2013-08-24 NOTE — Patient Instructions (Signed)
You can try the Allegra one per day if trying to avoid sedation, then benadryl up to every 4-6 hours if needed for hives if you are not driving or working on elevated surfaces due to sedation with this medicine.  Stop the omnicef, and if sore throat or cough not continuing to improve - start Z pak.  Return to the clinic or go to the nearest emergency room if any of your symptoms worsen or new symptoms occur. Hives Hives are itchy, red, swollen areas of the skin. They can vary in size and location on your body. Hives can come and go for hours or several days (acute hives) or for several weeks (chronic hives). Hives do not spread from person to person (noncontagious). They may get worse with scratching, exercise, and emotional stress. CAUSES   Allergic reaction to food, additives, or drugs.  Infections, including the common cold.  Illness, such as vasculitis, lupus, or thyroid disease.  Exposure to sunlight, heat, or cold.  Exercise.  Stress.  Contact with chemicals. SYMPTOMS   Red or white swollen patches on the skin. The patches may change size, shape, and location quickly and repeatedly.  Itching.  Swelling of the hands, feet, and face. This may occur if hives develop deeper in the skin. DIAGNOSIS  Your caregiver can usually tell what is wrong by performing a physical exam. Skin or blood tests may also be done to determine the cause of your hives. In some cases, the cause cannot be determined. TREATMENT  Mild cases usually get better with medicines such as antihistamines. Severe cases may require an emergency epinephrine injection. If the cause of your hives is known, treatment includes avoiding that trigger.  HOME CARE INSTRUCTIONS   Avoid causes that trigger your hives.  Take antihistamines as directed by your caregiver to reduce the severity of your hives. Non-sedating or low-sedating antihistamines are usually recommended. Do not drive while taking an antihistamine.  Take  any other medicines prescribed for itching as directed by your caregiver.  Wear loose-fitting clothing.  Keep all follow-up appointments as directed by your caregiver. SEEK MEDICAL CARE IF:   You have persistent or severe itching that is not relieved with medicine.  You have painful or swollen joints. SEEK IMMEDIATE MEDICAL CARE IF:   You have a fever.  Your tongue or lips are swollen.  You have trouble breathing or swallowing.  You feel tightness in the throat or chest.  You have abdominal pain. These problems may be the first sign of a life-threatening allergic reaction. Call your local emergency services (911 in [place] MAKE SURE YOU:   Understand these instructions.  Will watch your condition.  Will get help right away if you are not doing well or get worse. Document Released: 02/25/2005 Document Revised: 08/27/2011 Document Reviewed: 05/21/2011 [facility] Patient Information 2014 [place].

## 2013-09-04 ENCOUNTER — Ambulatory Visit (INDEPENDENT_AMBULATORY_CARE_PROVIDER_SITE_OTHER): Admitting: Family Medicine

## 2013-09-04 VITALS — BP 128/74 | HR 52 | Temp 98.3°F | Resp 16 | Ht 68.5 in | Wt 186.5 lb

## 2013-09-04 DIAGNOSIS — D6800 Von Willebrand disease, unspecified: Secondary | ICD-10-CM

## 2013-09-04 DIAGNOSIS — L03115 Cellulitis of right lower limb: Secondary | ICD-10-CM

## 2013-09-04 DIAGNOSIS — R21 Rash and other nonspecific skin eruption: Secondary | ICD-10-CM

## 2013-09-04 DIAGNOSIS — L03119 Cellulitis of unspecified part of limb: Secondary | ICD-10-CM

## 2013-09-04 DIAGNOSIS — L02419 Cutaneous abscess of limb, unspecified: Secondary | ICD-10-CM

## 2013-09-04 MED ORDER — DOXYCYCLINE HYCLATE 100 MG PO TABS: 100.0000 mg | ORAL_TABLET | Freq: Two times a day (BID) | ORAL | Status: DC

## 2013-09-04 MED ORDER — PREDNISONE 20 MG PO TABS: 40.0000 mg | ORAL_TABLET | Freq: Every day | ORAL | Status: DC

## 2013-09-04 NOTE — Progress Notes (Signed)
The chart was scribed for Robyn Haber, MD, by Neta Ehlers, ED Scribe. This patient's care was started at 11:11 AM.   Patient ID: Grant [name] MRN: [id number], DOB: 1955-12-10, 58 y.o. Date of Encounter: 09/04/2013, 11:08 AM  Primary Physician: Delia Chimes, NP  Chief Complaint:  Chief Complaint  Patient presents with   Rash    Legs & lower Torso     HPI: 58 y.o. year old male with history below presents with approximately four days of a gradually-spreading rash which has been associated with redness and itching. The pt states the rash began on his lower extremities and has spread to his thighs, abdomen, and back. He works outdoors. He denies recent travels. Grant [name] reports his grandson recently had a rash as well. The pt reports a h/o side effects with Omnicef.   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    Past Medical History  Diagnosis Date   Hypertension    Acid reflux    Allergy      Home Meds: Prior to Admission medications   Medication Sig Start Date End Date Taking? Authorizing Provider  AMLODIPINE BESYLATE PO Take 10 mg by mouth.   Yes Historical Provider, MD  Metoprolol-Hydrochlorothiazide 50-12.5 MG TB24 Take 50 mg by mouth.   Yes Historical Provider, MD  PANTOPRAZOLE SODIUM PO Take 40 mg by mouth.   Yes Historical Provider, MD    Allergies: No Known Allergies  History   Social History   Marital Status: Married    Spouse Name: N/A    Number of Children: N/A   Years of Education: N/A   Occupational History   Not on file.   Social History Main Topics   Smoking status: Former Smoker   Smokeless tobacco: Never Used   Alcohol Use: No   Drug Use: No   Sexual Activity: Not on file   Other Topics Concern   Not on file   Social History Narrative   No narrative on file     Review of Systems: Constitutional: negative for chills, fever, night sweats, weight changes, or fatigue  HEENT: negative for vision changes,  hearing loss, congestion, rhinorrhea, ST, epistaxis, or sinus pressure Cardiovascular: negative for chest pain or palpitations Respiratory: negative for hemoptysis, wheezing, shortness of breath, or cough Abdominal: negative for abdominal pain, nausea, vomiting, diarrhea, or constipation Dermatological: positive for rash  Neurologic: negative for headache, dizziness, or syncope All other systems reviewed and are otherwise negative with the exception to those above and in the HPI.   Physical Exam: Triage Vitals: Blood pressure 128/74, pulse 52, temperature 98.3 F (36.8 C), temperature source Oral, resp. rate 16, height 5' 8.5" (1.74 m), weight 186 lb 8 oz (84.596 kg), SpO2 98.00%., Body mass index is 27.94 kg/(m^2).  General: Well developed, well nourished, in no acute distress. Head: Normocephalic, atraumatic, eyes without discharge, sclera non-icteric, nares are without discharge. Bilateral auditory canals clear, TM's are without perforation, pearly grey and translucent with reflective cone of light bilaterally. Oral cavity moist, posterior pharynx without exudate, erythema, peritonsillar abscess, or post nasal drip.  Neck: Supple. No thyromegaly. Full ROM. No lymphadenopathy. Lungs: Clear bilaterally to auscultation without wheezes, rales, or rhonchi. Breathing is unlabored. Heart: RRR with S1 S2. No murmurs, rubs, or gallops appreciated. Abdomen: Soft, non-tender, non-distended with normoactive bowel sounds. No hepatomegaly. No rebound/guarding. No obvious abdominal masses. Msk:  Strength and tone normal for age. Extremities/Skin: Warm and dry. No clubbing or cyanosis. No edema.  No suspicious lesions.  Neuro: Alert and oriented X 3. Moves all extremities spontaneously. Gait is normal. CNII-XII grossly in tact. Psych:  Responds to questions appropriately with a normal affect.   Labs:   ASSESSMENT AND PLAN:  58 y.o. year old male with Rash and nonspecific skin eruption - Plan:  predniSONE (DELTASONE) 20 MG tablet  Cellulitis of right lower extremity - Plan: doxycycline (VIBRA-TABS) 100 MG tablet  Von Willebrand disease  Multiple areas of appear to be insect bites with a large indurated patch on his right inner thigh measuring about 4 cm suggestive of cellulitis.   Signed, Robyn Haber, MD 09/04/2013 11:08 AM

## 2013-11-16 ENCOUNTER — Ambulatory Visit (INDEPENDENT_AMBULATORY_CARE_PROVIDER_SITE_OTHER): Admitting: Family Medicine

## 2013-11-16 VITALS — BP 126/70 | HR 60 | Temp 97.6°F | Resp 16 | Ht 70.0 in | Wt 187.0 lb

## 2013-11-16 DIAGNOSIS — G609 Hereditary and idiopathic neuropathy, unspecified: Secondary | ICD-10-CM

## 2013-11-16 DIAGNOSIS — R202 Paresthesia of skin: Secondary | ICD-10-CM

## 2013-11-16 DIAGNOSIS — R002 Palpitations: Secondary | ICD-10-CM

## 2013-11-16 DIAGNOSIS — R209 Unspecified disturbances of skin sensation: Secondary | ICD-10-CM

## 2013-11-16 LAB — POCT CBC
GRANULOCYTE PERCENT: 76.7 % (ref 37–80)
HCT, POC: 48.4 % (ref 43.5–53.7)
Hemoglobin: 15.7 g/dL (ref 14.1–18.1)
LYMPH, POC: 1.9 (ref 0.6–3.4)
MCH, POC: 29.5 pg (ref 27–31.2)
MCHC: 32.4 g/dL (ref 31.8–35.4)
MCV: 91 fL (ref 80–97)
MID (CBC): 0.3 (ref 0–0.9)
MPV: 7 fL (ref 0–99.8)
PLATELET COUNT, POC: 310 10*3/uL (ref 142–424)
POC GRANULOCYTE: 7.2 — AB (ref 2–6.9)
POC LYMPH %: 20.6 % (ref 10–50)
POC MID %: 2.7 %M (ref 0–12)
RBC: 5.31 M/uL (ref 4.69–6.13)
RDW, POC: 13.5 %
WBC: 9.4 10*3/uL (ref 4.6–10.2)

## 2013-11-16 LAB — POCT GLYCOSYLATED HEMOGLOBIN (HGB A1C): Hemoglobin A1C: 5.3

## 2013-11-16 LAB — COMPREHENSIVE METABOLIC PANEL
ALT: 16 U/L (ref 0–53)
AST: 16 U/L (ref 0–37)
Albumin: 4.8 g/dL (ref 3.5–5.2)
Alkaline Phosphatase: 133 U/L — ABNORMAL HIGH (ref 39–117)
BILIRUBIN TOTAL: 0.8 mg/dL (ref 0.2–1.2)
BUN: 13 mg/dL (ref 6–23)
CALCIUM: 9.9 mg/dL (ref 8.4–10.5)
CO2: 29 meq/L (ref 19–32)
Chloride: 101 mEq/L (ref 96–112)
Creat: 0.99 mg/dL (ref 0.50–1.35)
Glucose, Bld: 100 mg/dL — ABNORMAL HIGH (ref 70–99)
Potassium: 4.4 mEq/L (ref 3.5–5.3)
Sodium: 137 mEq/L (ref 135–145)
Total Protein: 7.4 g/dL (ref 6.0–8.3)

## 2013-11-16 LAB — GLUCOSE, POCT (MANUAL RESULT ENTRY): POC GLUCOSE: 98 mg/dL (ref 70–99)

## 2013-11-16 LAB — VITAMIN B12: Vitamin B-12: 567 pg/mL (ref 211–911)

## 2013-11-16 NOTE — Progress Notes (Signed)
Subjective:  58 year old man who has been here before. He has had problems over the last 7-10 days with a numb sensation in his feet. This is in the distal half of the foot involving the toes and balls of the feet. Knows of doing nothing differently. He has a history of his sugar running a little bit high in the past at times, and there is a family history of diabetes. He has not been diabetic or had a B12 deficiency that he knows of. He has had circulations studies done to his legs when he had some previous foot problems. These were good. Today he's had some palpitations, which she's had in the past. He takes a blood pressure pill but also a beta blocker for the palpitations. They're usually pretty well controlled. 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 The feet have been bothering him both day and night.  Objective: Healthy-appearing middle-age man. Chest clear. Heart regular without murmurs. No abdominal bruits. Good femoral pulses. Palpable but fairly weak feeling posterior tib pulses, right may be a little bit stronger than the left. Capillary refill is good. Sensory is subjectively abnormal in the distal part of both feet. Grossly these appear normal.  Assessment:  Peripheral neuropathy, etiology undetermined Paresthesias in toes Palpitations, nonspecific History of hypertension  Plan: Check CBC, hemoglobin A1c, B12, glucose, and BMet.  Last time he was here the potassium was a little bit low.  Results for orders placed in visit on 11/16/13  POCT CBC      Result Value Ref Range   WBC 9.4  4.6 - 10.2 K/uL   Lymph, poc 1.9  0.6 - 3.4   POC LYMPH PERCENT 20.6  10 - 50 %L   MID (cbc) 0.3  0 - 0.9   POC MID % 2.7  0 - 12 %M   POC Granulocyte 7.2 (*) 2 - 6.9   Granulocyte percent 76.7  37 - 80 %G   RBC 5.31  4.69 - 6.13 M/uL   Hemoglobin 15.7  14.1 - 18.1 g/dL   HCT, POC 48.4  43.5 - 53.7 %   MCV 91.0  80 - 97 fL   MCH, POC 29.5  27 - 31.2 pg   MCHC 32.4  31.8 - 35.4 g/dL    RDW, POC 13.5     Platelet Count, POC 310  142 - 424 K/uL   MPV 7.0  0 - 99.8 fL  POCT GLYCOSYLATED HEMOGLOBIN (HGB A1C)      Result Value Ref Range   Hemoglobin A1C 5.3    GLUCOSE, POCT (MANUAL RESULT ENTRY)      Result Value Ref Range   POC Glucose 98  70 - 99 mg/dl   Treat symptomatically. If symptoms get worse we'll refer to a neurologist. Asked him to give it a few weeks and see how he is doing before we pursue more.

## 2013-11-16 NOTE — Patient Instructions (Addendum)
Peripheral Neuropathy Peripheral neuropathy is a type of nerve damage. It affects nerves that carry signals between the spinal cord and other parts of the body. These are called peripheral nerves. With peripheral neuropathy, one nerve or a group of nerves may be damaged.  CAUSES  Many things can damage peripheral nerves. For some people with peripheral neuropathy, the cause is unknown. Some causes include:  Diabetes. This is the most common cause of peripheral neuropathy.  Injury to a nerve.  Pressure or stress on a nerve that lasts a long time.  Too little vitamin B. Alcoholism can lead to this.  Infections.  Autoimmune diseases, such as multiple sclerosis and systemic lupus erythematosus.  Inherited nerve diseases.  Some medicines, such as cancer drugs.  Toxic substances, such as lead and mercury.  Too little blood flowing to the legs.  Kidney disease.  Thyroid disease. SIGNS AND SYMPTOMS  Different people have different symptoms. The symptoms you have will depend on which of your nerves is damaged. Common symptoms include:  Loss of feeling (numbness) in the feet and hands.  Tingling in the feet and hands.  Pain that burns.  Very sensitive skin.  Weakness.  Not being able to move a part of the body (paralysis).  Muscle twitching.  Clumsiness or poor coordination.  Loss of balance.  Not being able to control your bladder.  Feeling dizzy.  Sexual problems. DIAGNOSIS  Peripheral neuropathy is a symptom, not a disease. Finding the cause of peripheral neuropathy can be hard. To figure that out, your health care provider will take a medical history and do a physical exam. A neurological exam will also be done. This involves checking things affected by your brain, spinal cord, and nerves (nervous system). For example, your health care provider will check your reflexes, how you move, and what you can feel.  Other types of tests may also be ordered, such as:  Blood  tests.  A test of the fluid in your spinal cord.  Imaging tests, such as CT scans or an MRI.  Electromyography (EMG). This test checks the nerves that control muscles.  Nerve conduction velocity tests. These tests check how fast messages pass through your nerves.  Nerve biopsy. A small piece of nerve is removed. It is then checked under a microscope. TREATMENT   Medicine is often used to treat peripheral neuropathy. Medicines may include:  Pain-relieving medicines. Prescription or over-the-counter medicine may be suggested.  Antiseizure medicine. This may be used for pain.  Antidepressants. These also may help ease pain from neuropathy.  Lidocaine. This is a numbing medicine. You might wear a patch or be given a shot.  Mexiletine. This medicine is typically used to help control irregular heart rhythms.  Surgery. Surgery may be needed to relieve pressure on a nerve or to destroy a nerve that is causing pain.  Physical therapy to help movement.  Assistive devices to help movement. HOME CARE INSTRUCTIONS   Only take over-the-counter or prescription medicines as directed by your health care provider. Follow the instructions carefully for any given medicines. Do not take any other medicines without first getting approval from your health care provider.  If you have diabetes, work closely with your health care provider to keep your blood sugar under control.  If you have numbness in your feet:  Check every day for signs of injury or infection. Watch for redness, warmth, and swelling.  Wear padded socks and comfortable shoes. These help protect your feet.  Do not do   things that put pressure on your damaged nerve.  Do not smoke. Smoking keeps blood from getting to damaged nerves.  Avoid or limit alcohol. Too much alcohol can cause a lack of B vitamins. These vitamins are needed for healthy nerves.  Develop a good support system. Coping with peripheral neuropathy can be  stressful. Talk to a mental health specialist or join a support group if you are struggling.  Follow up with your health care provider as directed. SEEK MEDICAL CARE IF:   You have new signs or symptoms of peripheral neuropathy.  You are struggling emotionally from dealing with peripheral neuropathy.  You have a fever. SEEK IMMEDIATE MEDICAL CARE IF:   You have an injury or infection that is not healing.  You feel very dizzy or begin vomiting.  You have chest pain.  You have trouble breathing. Document Released: 02/15/2002 Document Revised: 11/07/2010 Document Reviewed: 11/02/2012 [facility] Patient Information 2015 [place], [place]. This information is not intended to replace advice given to you by your health care provider. Make sure you discuss any questions you have with your health care provider.     We will await the rest of your lab tests. I do not have a good explanation for things at this time. My advice is to live with it, take Tylenol or ibuprofen if needed for the discomfort which may help some. If symptoms are getting worse we will make a referral to a neurologist. Since it has not been going on for a long, I would advise giving several weeks first.

## 2014-02-24 ENCOUNTER — Ambulatory Visit
Admission: RE | Admit: 2014-02-24 | Discharge: 2014-02-24 | Disposition: A | Discharge status: Home / Self Care | Source: Ambulatory Visit | Attending: Nurse Practitioner | Admitting: Nurse Practitioner

## 2014-02-24 ENCOUNTER — Other Ambulatory Visit: Payer: Self-pay | Admitting: Nurse Practitioner

## 2014-02-24 DIAGNOSIS — R52 Pain, unspecified: Secondary | ICD-10-CM

## 2014-05-18 ENCOUNTER — Other Ambulatory Visit: Payer: Self-pay | Admitting: Nurse Practitioner

## 2014-05-18 ENCOUNTER — Ambulatory Visit
Admission: RE | Admit: 2014-05-18 | Discharge: 2014-05-18 | Disposition: A | Discharge status: Home / Self Care | Source: Ambulatory Visit | Attending: Nurse Practitioner | Admitting: Nurse Practitioner

## 2014-05-18 DIAGNOSIS — M545 Low back pain: Secondary | ICD-10-CM

## 2014-09-15 ENCOUNTER — Emergency Department (HOSPITAL_COMMUNITY)
Admission: EM | Admit: 2014-09-15 | Discharge: 2014-09-15 | Disposition: A | Discharge status: Home / Self Care | Attending: Emergency Medicine | Admitting: Emergency Medicine

## 2014-09-15 ENCOUNTER — Encounter (HOSPITAL_COMMUNITY): Payer: Self-pay

## 2014-09-15 DIAGNOSIS — R0602 Shortness of breath: Secondary | ICD-10-CM | POA: Insufficient documentation

## 2014-09-15 DIAGNOSIS — Z87891 Personal history of nicotine dependence: Secondary | ICD-10-CM | POA: Diagnosis not present

## 2014-09-15 DIAGNOSIS — K219 Gastro-esophageal reflux disease without esophagitis: Secondary | ICD-10-CM | POA: Diagnosis not present

## 2014-09-15 DIAGNOSIS — Y9289 Other specified places as the place of occurrence of the external cause: Secondary | ICD-10-CM | POA: Insufficient documentation

## 2014-09-15 DIAGNOSIS — Z79899 Other long term (current) drug therapy: Secondary | ICD-10-CM | POA: Diagnosis not present

## 2014-09-15 DIAGNOSIS — Y998 Other external cause status: Secondary | ICD-10-CM | POA: Insufficient documentation

## 2014-09-15 DIAGNOSIS — Y9389 Activity, other specified: Secondary | ICD-10-CM | POA: Insufficient documentation

## 2014-09-15 DIAGNOSIS — I10 Essential (primary) hypertension: Secondary | ICD-10-CM | POA: Insufficient documentation

## 2014-09-15 DIAGNOSIS — T63441A Toxic effect of venom of bees, accidental (unintentional), initial encounter: Secondary | ICD-10-CM | POA: Diagnosis present

## 2014-09-15 MED ORDER — FAMOTIDINE IN NACL 20-0.9 MG/50ML-% IV SOLN
20.0000 mg | Freq: Once | INTRAVENOUS | Status: AC
  Administered 2014-09-15: 20 mg via INTRAVENOUS
  Filled 2014-09-15: qty 50

## 2014-09-15 MED ORDER — DIPHENHYDRAMINE HCL 25 MG PO TABS: 25.0000 mg | ORAL_TABLET | Freq: Four times a day (QID) | ORAL | Status: DC

## 2014-09-15 MED ORDER — EPINEPHRINE 0.3 MG/0.3ML IJ SOAJ: 0.3000 mg | Freq: Once | INTRAMUSCULAR | Status: DC

## 2014-09-15 MED ORDER — METHYLPREDNISOLONE SODIUM SUCC 125 MG IJ SOLR
125.0000 mg | Freq: Once | INTRAMUSCULAR | Status: AC
  Administered 2014-09-15: 125 mg via INTRAVENOUS
  Filled 2014-09-15: qty 2

## 2014-09-15 MED ORDER — PREDNISONE 50 MG PO TABS: ORAL_TABLET | ORAL | Status: DC

## 2014-09-15 MED ORDER — SODIUM CHLORIDE 0.9 % IV BOLUS (SEPSIS)
1000.0000 mL | Freq: Once | INTRAVENOUS | Status: AC
  Administered 2014-09-15: 1000 mL via INTRAVENOUS

## 2014-09-15 NOTE — ED Provider Notes (Signed)
CSN: [id number]     Arrival date & time 09/15/14  1637 History   First MD Initiated Contact with Patient 09/15/14 1640     Chief Complaint  Patient presents with  . Insect Bite     (Consider location/radiation/quality/duration/timing/severity/associated sxs/prior Treatment) HPI Comments: Patient states he was stung by 6-10 yellow jackets. 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 He endorses swelling of his face, itching of his back and chest. Denies fever. No chest pain. Feels like he is breathing normally now after Benadryl. EMS gave him 50 mg of Benadryl that helped his itching. Reports reaction to bee venom in [place] several years ago. Does not have an epinephrine pen. History of hypertension only. Denies any difficulty breathing or swallowing at this time. No nausea or vomiting.  The history is provided by the patient and the EMS personnel.    Past Medical History  Diagnosis Date  . Hypertension   . Acid reflux   . Allergy    Past Surgical History  Procedure Laterality Date  . Open reduction internal fixation (orif) finger with radial bone graft Right 02/04/2013    Procedure: OPEN REDUCTION INTERNAL FIXATION (ORIF) right ring and small fingers;  Surgeon: Linna Hoff, MD;  Location: [facility];  Service: Orthopedics;  Laterality: Right;  . Incision and drainage Right 02/04/2013    Procedure: INCISION AND DRAINAGE;  Surgeon: Linna Hoff, MD;  Location: [facility];  Service: Orthopedics;  Laterality: Right;   Family History  Problem Relation Age of Onset  . Hyperlipidemia Mother   . Hyperlipidemia Father    History  Substance Use Topics  . Smoking status: Former Research scientist (life sciences)  . Smokeless tobacco: Never Used  . Alcohol Use: No    Review of Systems  Constitutional: Negative for fever, activity change and appetite change.  Respiratory: Positive for shortness of breath. Negative for cough and chest tightness.   Cardiovascular: Negative for chest pain.   Gastrointestinal: Negative for nausea, vomiting and abdominal pain.  Genitourinary: Negative for dysuria and hematuria.  Skin: Positive for rash.  Neurological: Negative for dizziness and headaches.  A complete 10 system review of systems was obtained and all systems are negative except as noted in the HPI and PMH.      Allergies  Asa and Bee venom  Home Medications   Prior to Admission medications   Medication Sig Start Date End Date Taking? Authorizing Provider  esomeprazole (NEXIUM) 40 MG capsule Take 40 mg by mouth daily.    Yes Historical Provider, MD  metoprolol succinate (TOPROL-XL) 50 MG 24 hr tablet Take 50 mg by mouth daily. Take with or immediately following a meal.   Yes Historical Provider, MD  pregabalin (LYRICA) 150 MG capsule Take 150 mg by mouth at bedtime.   Yes Historical Provider, MD  diphenhydrAMINE (BENADRYL) 25 MG tablet Take 1 tablet (25 mg total) by mouth every 6 (six) hours. 09/15/14   Ezequiel Essex, MD  EPINEPHrine (EPIPEN 2-PAK) 0.3 mg/0.3 mL IJ SOAJ injection Inject 0.3 mLs (0.3 mg total) into the muscle once. For difficulty breathing or swallowing due to allergic reaction 09/15/14   Ezequiel Essex, MD  predniSONE (DELTASONE) 50 MG tablet 1 tablet PO daily 09/15/14   Ezequiel Essex, MD   BP 118/75 mmHg  Pulse 64  Temp(Src) 98.2 F (36.8 C) (Oral)  Resp 20  Ht 5\' 9"  (1.753 m)  Wt 180 lb (81.647 kg)  BMI 26.57 kg/m2  SpO2 98% Physical Exam  Constitutional: He is oriented to person, place, and time. He appears well-developed and well-nourished. No distress.  HENT:  Head: Normocephalic and atraumatic.  Mouth/Throat: Oropharynx is clear and moist. No oropharyngeal exudate.  No swelling of lips or tongue.  Eyes: Conjunctivae and EOM are normal. Pupils are equal, round, and reactive to light.  Neck: Normal range of motion. Neck supple.  No meningismus.  Cardiovascular: Normal rate, regular rhythm, normal heart sounds and intact distal pulses.   No  murmur heard. Pulmonary/Chest: Effort normal and breath sounds normal. No respiratory distress. He has no wheezes.  Lungs clear no wheezing  Abdominal: Soft. There is no tenderness. There is no rebound and no guarding.  Musculoskeletal: Normal range of motion. He exhibits no edema or tenderness.  Neurological: He is alert and oriented to person, place, and time. No cranial nerve deficit. He exhibits normal muscle tone. Coordination normal.  No ataxia on finger to nose bilaterally. No pronator drift. 5/5 strength throughout. CN 2-12 intact. Negative Romberg. Equal grip strength. Sensation intact. Gait is normal.   Skin: Skin is warm. Rash noted.  Urticarial rash to back and torso.  Psychiatric: He has a normal mood and affect. His behavior is normal.  Nursing note and vitals reviewed.   ED Course  Procedures (including critical care time) Labs Review Labs Reviewed - No data to display  Imaging Review No results found.   EKG Interpretation None      MDM   Final diagnoses:  Allergic reaction to bee sting, accidental or unintentional, initial encounter   allergic reaction to multiple bee stings. No difficulty breathing or swallowing. No airway swelling. No wheezing on exam  Recheck 7:30 PM. Patient feeling much improved. No difficulty breathing or swallowing. No wheezing. No tongue or lips swelling. Rash has improved and almost resolved.  We'll treat for allergic reaction with prednisone and histamines. Given epinephrine pen and instructions for use. Return precautions discussed.  Ezequiel Essex, MD 09/15/14 2142

## 2014-09-15 NOTE — ED Notes (Signed)
Patient has been up and walking in room and walked to restroom with no problems.

## 2014-09-15 NOTE — ED Notes (Signed)
Patient stung by yellow jackets states that the areas are a little tender. Waiting for discharge.

## 2014-09-15 NOTE — Discharge Instructions (Signed)
Anaphylactic Reaction Take the prednisone and benadryl as prescribed. Use the epipen as needed for severe allergic reaction. Return to the ED if you develop difficulty breathing, difficulty swallowing or any other concerns. An anaphylactic reaction is a sudden, severe allergic reaction that involves the whole body. It can be life threatening. A hospital stay is often required. People with asthma, eczema, or hay fever are slightly more likely to have an anaphylactic reaction. CAUSES  An anaphylactic reaction may be caused by anything to which you are allergic. After being exposed to the allergic substance, your immune system becomes sensitized to it. When you are exposed to that allergic substance again, an allergic reaction can occur. Common causes of an anaphylactic reaction include:  Medicines.  Foods, especially peanuts, wheat, shellfish, milk, and eggs.  Insect bites or stings.  Blood products.  Chemicals, such as dyes, latex, and contrast material used for imaging tests. SYMPTOMS  When an allergic reaction occurs, the body releases histamine and other substances. These substances cause symptoms such as tightening of the airway. Symptoms often develop within seconds or minutes of exposure. Symptoms may include:  Skin rash or hives.  Itching.  Chest tightness.  Swelling of the eyes, tongue, or lips.  Trouble breathing or swallowing.  Lightheadedness or fainting.  Anxiety or confusion.  Stomach pains, vomiting, or diarrhea.  Nasal congestion.  A fast or irregular heartbeat (palpitations). DIAGNOSIS  Diagnosis is based on your history of recent exposure to allergic substances, your symptoms, and a physical exam. Your caregiver may also perform blood or urine tests to confirm the diagnosis. TREATMENT  Epinephrine medicine is the main treatment for an anaphylactic reaction. Other medicines that may be used for treatment include antihistamines, steroids, and albuterol. In severe  cases, fluids and medicine to support blood pressure may be given through an intravenous line (IV). Even if you improve after treatment, you need to be observed to make sure your condition does not get worse. This may require a stay in the hospital. Nelson a medical alert bracelet or necklace stating your allergy.  You and your family must learn how to use an anaphylaxis kit or give an epinephrine injection to temporarily treat an emergency allergic reaction. Always carry your epinephrine injection or anaphylaxis kit with you. This can be lifesaving if you have a severe reaction.  Do not drive or perform tasks after treatment until the medicines used to treat your reaction have worn off, or until your caregiver says it is okay.  If you have hives or a rash:  Take medicines as directed by your caregiver.  You may use an over-the-counter antihistamine (diphenhydramine) as needed.  Apply cold compresses to the skin or take baths in cool water. Avoid hot baths or showers. SEEK MEDICAL CARE IF:   You develop symptoms of an allergic reaction to a new substance. Symptoms may start right away or minutes later.  You develop a rash, hives, or itching.  You develop new symptoms. SEEK IMMEDIATE MEDICAL CARE IF:   You have swelling of the mouth, difficulty breathing, or wheezing.  You have a tight feeling in your chest or throat.  You develop hives, swelling, or itching all over your body.  You develop severe vomiting or diarrhea.  You feel faint or pass out. This is an emergency. Use your epinephrine injection or anaphylaxis kit as you have been instructed. Call your local emergency services (911 in [place] Even if you improve after the injection, you need  to be examined at a hospital emergency department. MAKE SURE YOU:   Understand these instructions.  Will watch your condition.  Will get help right away if you are not doing well or get worse. Document Released:  02/25/2005 Document Revised: 03/02/2013 Document Reviewed: 05/29/2011 [facility] Patient Information 2015 [place], [place]. This information is not intended to replace advice given to you by your health care provider. Make sure you discuss any questions you have with your health care provider.

## 2014-09-15 NOTE — ED Notes (Signed)
Pt states he was stung by multiply bees to his back and his right hand. IV was started by EMS prior to arrival and pt was given Benadryl 50 mg. Pt's IV accidentally got pulled out when pt got off of stretcher

## 2015-06-30 ENCOUNTER — Encounter (HOSPITAL_COMMUNITY): Payer: Self-pay | Admitting: Family Medicine

## 2015-06-30 ENCOUNTER — Emergency Department (HOSPITAL_COMMUNITY)
Admission: EM | Admit: 2015-06-30 | Discharge: 2015-06-30 | Disposition: A | Discharge status: Home / Self Care | Attending: Emergency Medicine | Admitting: Emergency Medicine

## 2015-06-30 DIAGNOSIS — W57XXXD Bitten or stung by nonvenomous insect and other nonvenomous arthropods, subsequent encounter: Secondary | ICD-10-CM | POA: Diagnosis not present

## 2015-06-30 DIAGNOSIS — S30861D Insect bite (nonvenomous) of abdominal wall, subsequent encounter: Secondary | ICD-10-CM | POA: Diagnosis not present

## 2015-06-30 DIAGNOSIS — K219 Gastro-esophageal reflux disease without esophagitis: Secondary | ICD-10-CM | POA: Diagnosis not present

## 2015-06-30 DIAGNOSIS — I10 Essential (primary) hypertension: Secondary | ICD-10-CM | POA: Insufficient documentation

## 2015-06-30 DIAGNOSIS — Z87891 Personal history of nicotine dependence: Secondary | ICD-10-CM | POA: Diagnosis not present

## 2015-06-30 DIAGNOSIS — Z79899 Other long term (current) drug therapy: Secondary | ICD-10-CM | POA: Diagnosis not present

## 2015-06-30 DIAGNOSIS — W57XXXA Bitten or stung by nonvenomous insect and other nonvenomous arthropods, initial encounter: Secondary | ICD-10-CM

## 2015-06-30 MED ORDER — DOXYCYCLINE HYCLATE 100 MG PO CAPS: 100.0000 mg | ORAL_CAPSULE | Freq: Two times a day (BID) | ORAL | Status: DC

## 2015-06-30 NOTE — ED Provider Notes (Signed)
CSN: [id number]     Arrival date & time 06/30/15  [id number] History   First MD Initiated Contact with Patient 06/30/15 [phone]     Chief Complaint  Patient presents with  . Tick Removal     (Consider location/radiation/quality/duration/timing/severity/associated sxs/prior Treatment) HPI Comments: Patient presents emergency department with chief complaint of tick bite. Patient states that he noticed the tick this morning. He is uncertain of when it attached. He denies any associated fevers, chills, body aches, or arthralgias/myalgias. He states that it is slightly itchy. Denies any associated nausea, vomiting, or diarrhea. There are no other associated symptoms. There are no modifying factors.  The history is provided by the patient. No language interpreter was used.    Past Medical History  Diagnosis Date  . Hypertension   . Acid reflux   . Allergy    Past Surgical History  Procedure Laterality Date  . Open reduction internal fixation (orif) finger with radial bone graft Right 02/04/2013    Procedure: OPEN REDUCTION INTERNAL FIXATION (ORIF) right ring and small fingers;  Surgeon: Linna Hoff, MD;  Location: [facility];  Service: Orthopedics;  Laterality: Right;  . Incision and drainage Right 02/04/2013    Procedure: INCISION AND DRAINAGE;  Surgeon: Linna Hoff, MD;  Location: [facility];  Service: Orthopedics;  Laterality: Right;   Family History  Problem Relation Age of Onset  . Hyperlipidemia Mother   . Hyperlipidemia Father    Social History  Substance Use Topics  . Smoking status: Former Research scientist (life sciences)  . Smokeless tobacco: Never Used  . Alcohol Use: No    Review of Systems  All other systems reviewed and are negative.     Allergies  Asa and Bee venom  Home Medications   Prior to Admission medications   Medication Sig Start Date End Date Taking? Authorizing Provider  diphenhydrAMINE (BENADRYL) 25 MG tablet Take 1 tablet (25 mg total) by mouth every 6 (six) hours. 09/15/14   Ezequiel Essex, MD  EPINEPHrine (EPIPEN 2-PAK) 0.3 mg/0.3 mL IJ SOAJ injection Inject 0.3 mLs (0.3 mg total) into the muscle once. For difficulty breathing or swallowing due to allergic reaction 09/15/14   Ezequiel Essex, MD  esomeprazole (NEXIUM) 40 MG capsule Take 40 mg by mouth daily.     Historical Provider, MD  metoprolol succinate (TOPROL-XL) 50 MG 24 hr tablet Take 50 mg by mouth daily. Take with or immediately following a meal.    Historical Provider, MD  predniSONE (DELTASONE) 50 MG tablet 1 tablet PO daily 09/15/14   Ezequiel Essex, MD  pregabalin (LYRICA) 150 MG capsule Take 150 mg by mouth at bedtime.    Historical Provider, MD   BP 135/81 mmHg  Pulse 61  Temp(Src) 97.6 F (36.4 C) (Oral)  Resp 18  Ht 5\' 6"  (1.676 m)  Wt 83.915 kg  BMI 29.87 kg/m2  SpO2 100% Physical Exam  Constitutional: He is oriented to person, place, and time. He appears well-developed and well-nourished.  HENT:  Head: Normocephalic and atraumatic.  Eyes: Conjunctivae and EOM are normal. Pupils are equal, round, and reactive to light. Right eye exhibits no discharge. Left eye exhibits no discharge. No scleral icterus.  Neck: Normal range of motion. Neck supple. No JVD present.  Cardiovascular: Normal rate, regular rhythm and normal heart sounds.  Exam reveals no gallop and no friction rub.   No murmur heard. Pulmonary/Chest: Effort normal and breath sounds normal. No respiratory distress. He has no wheezes. He has no rales. He exhibits  no tenderness.  Abdominal: Soft. He exhibits no distension and no mass. There is no tenderness. There is no rebound and no guarding.  Musculoskeletal: Normal range of motion. He exhibits no edema or tenderness.  Moves all extremities, no swollen joints  Neurological: He is alert and oriented to person, place, and time.  Skin: Skin is warm and dry.  Tick attached to left flank, mild surrounding erythema, no abscess  Psychiatric: He has a normal mood and affect. His behavior is  normal. Judgment and thought content normal.  Nursing note and vitals reviewed.   ED Course  .Foreign Body Removal Date/Time: 06/30/2015 7:28 AM Performed by: Montine Circle Authorized by: Montine Circle Consent: Verbal consent obtained. Risks and benefits: risks, benefits and alternatives were discussed Consent given by: patient Patient understanding: patient states understanding of the procedure being performed Patient consent: the patient's understanding of the procedure matches consent given Procedure consent: procedure consent matches procedure scheduled Relevant documents: relevant documents present and verified Test results: test results available and properly labeled Site marked: the operative site was marked Imaging studies: imaging studies available Required items: required blood products, implants, devices, and special equipment available Patient identity confirmed: verbally with patient Time out: Immediately prior to procedure a "time out" was called to verify the correct patient, procedure, equipment, support staff and site/side marked as required. Body area: skin General location: trunk Location details: left flank Patient sedated: no Patient restrained: no Patient cooperative: yes Localization method: visualized Removal mechanism: forceps Tendon involvement: superficial Depth: Cutaneous. Complexity: simple 1 objects recovered. Objects recovered: Tick Post-procedure assessment: foreign body removed Patient tolerance: Patient tolerated the procedure well with no immediate complications   (including critical care time)   MDM   Final diagnoses:  Tick bite with subsequent removal of tick    Patient with tick bite. Unknown length of exposure. Will treat with doxycycline. Discharge to home. Tick removed.   Montine Circle, PA-C 06/30/15 [place], DO 06/30/15 [phone]

## 2015-06-30 NOTE — ED Notes (Signed)
Pt comfortable with discharge and follow up instructions. Pt declines wheelchair, escorted to waiting area by this RN. Rx x2 

## 2015-06-30 NOTE — Discharge Instructions (Signed)
Tick Bite Information Ticks are insects that attach themselves to the skin and draw blood for food. There are various types of ticks. Common types include wood ticks and deer ticks. Most ticks live in shrubs and grassy areas. Ticks can climb onto your body when you make contact with leaves or grass where the tick is waiting. The most common places on the body for ticks to attach themselves are the scalp, neck, armpits, waist, and groin. Most tick bites are harmless, but sometimes ticks carry germs that cause diseases. These germs can be spread to a person during the tick's feeding process. The chance of a disease spreading through a tick bite depends on:   The type of tick.  Time of year.   How long the tick is attached.   Geographic location.  HOW CAN YOU PREVENT TICK BITES? Take these steps to help prevent tick bites when you are outdoors:  Wear protective clothing. Long sleeves and long pants are best.   Wear white clothes so you can see ticks more easily.  Tuck your pant legs into your socks.   If walking on a trail, stay in the middle of the trail to avoid brushing against bushes.  Avoid walking through areas with long grass.  Put insect repellent on all exposed skin and along boot tops, pant legs, and sleeve cuffs.   Check clothing, hair, and skin repeatedly and before going inside.   Brush off any ticks that are not attached.  Take a shower or bath as soon as possible after being outdoors.  WHAT IS THE PROPER WAY TO REMOVE A TICK? Ticks should be removed as soon as possible to help prevent diseases caused by tick bites. 1. If latex gloves are available, put them on before trying to remove a tick.  2. Using fine-point tweezers, grasp the tick as close to the skin as possible. You may also use curved forceps or a tick removal tool. Grasp the tick as close to its head as possible. Avoid grasping the tick on its body. 3. Pull gently with steady upward pressure until  the tick lets go. Do not twist the tick or jerk it suddenly. This may break off the tick's head or mouth parts. 4. Do not squeeze or crush the tick's body. This could force disease-carrying fluids from the tick into your body.  5. After the tick is removed, wash the bite area and your hands with soap and water or other disinfectant such as alcohol. 6. Apply a small amount of antiseptic cream or ointment to the bite site.  7. Wash and disinfect any instruments that were used.  Do not try to remove a tick by applying a hot match, petroleum jelly, or fingernail polish to the tick. These methods do not work and may increase the chances of disease being spread from the tick bite.  WHEN SHOULD YOU SEEK MEDICAL CARE? Contact your health care provider if you are unable to remove a tick from your skin or if a part of the tick breaks off and is stuck in the skin.  After a tick bite, you need to be aware of signs and symptoms that could be related to diseases spread by ticks. Contact your health care provider if you develop any of the following in the days or weeks after the tick bite:  Unexplained fever.  Rash. A circular rash that appears days or weeks after the tick bite may indicate the possibility of Lyme disease. The rash may resemble   a target with a bull's-eye and may occur at a different part of your body than the tick bite.  Redness and swelling in the area of the tick bite.   Tender, swollen lymph glands.   Diarrhea.   Weight loss.   Cough.   Fatigue.   Muscle, joint, or bone pain.   Abdominal pain.   Headache.   Lethargy or a change in your level of consciousness.  Difficulty walking or moving your legs.   Numbness in the legs.   Paralysis.  Shortness of breath.   Confusion.   Repeated vomiting.    This information is not intended to replace advice given to you by your health care provider. Make sure you discuss any questions you have with your health  care provider.   Document Released: 02/23/2000 Document Revised: 03/18/2014 Document Reviewed: 08/05/2012 Elsevier Interactive Patient Education 2016 Elsevier Inc.  

## 2015-06-30 NOTE — ED Notes (Signed)
Pt presents from home via POV with c/o needing tick removal from left upper back.  He has an area erythema surrounding the tick and another area below the tick.  Area does not appear to be in a "bullseye" form.

## 2016-06-12 ENCOUNTER — Other Ambulatory Visit: Payer: Self-pay | Admitting: Family Medicine

## 2016-06-12 MED ORDER — ESOMEPRAZOLE MAGNESIUM 40 MG PO CPDR: 40.0000 mg | DELAYED_RELEASE_CAPSULE | Freq: Every day | ORAL | 0 refills | Status: DC

## 2016-06-12 NOTE — Telephone Encounter (Signed)
Re-est with Dr Allen Kell on Monday just needs temp supply of Nexium until then.  1 mth refill sent.

## 2016-06-17 ENCOUNTER — Encounter: Payer: Self-pay | Admitting: Family Medicine

## 2016-06-17 ENCOUNTER — Ambulatory Visit (INDEPENDENT_AMBULATORY_CARE_PROVIDER_SITE_OTHER): Admitting: Family Medicine

## 2016-06-17 VITALS — BP 152/98 | HR 60 | Temp 98.1°F | Resp 18 | Ht 66.0 in | Wt 182.0 lb

## 2016-06-17 DIAGNOSIS — I10 Essential (primary) hypertension: Secondary | ICD-10-CM | POA: Diagnosis not present

## 2016-06-17 DIAGNOSIS — Z125 Encounter for screening for malignant neoplasm of prostate: Secondary | ICD-10-CM

## 2016-06-17 DIAGNOSIS — Z1159 Encounter for screening for other viral diseases: Secondary | ICD-10-CM

## 2016-06-17 DIAGNOSIS — F5101 Primary insomnia: Secondary | ICD-10-CM

## 2016-06-17 DIAGNOSIS — Z7689 Persons encountering health services in other specified circumstances: Secondary | ICD-10-CM | POA: Diagnosis not present

## 2016-06-17 DIAGNOSIS — R0681 Apnea, not elsewhere classified: Secondary | ICD-10-CM

## 2016-06-17 DIAGNOSIS — G4700 Insomnia, unspecified: Secondary | ICD-10-CM | POA: Insufficient documentation

## 2016-06-17 MED ORDER — OMEPRAZOLE 40 MG PO CPDR: 40.0000 mg | DELAYED_RELEASE_CAPSULE | Freq: Every day | ORAL | 3 refills | Status: DC

## 2016-06-17 NOTE — Progress Notes (Signed)
Subjective:    Patient ID: Grant [name], male    DOB: [birth year], 61 y.o.   MRN: [id number]  HPI Here today to establish care.  Patient takes metoprolol due to a history of tachycardia and racing heart rate in the past. On this medication this is prevented. He tried some of this to anxiety. He believes some of his symptoms are due to palpitations secondary to anxiety. He is also on amlodipine for hypertension. His blood pressure here today is elevated. He states his blood pressure at home however is much better and that he is nervous and doctor's office. He also takes Nexium every day for acid reflux. If he stops the medication, within 2 or 3 days the symptoms come back just as bad as before. He denies any blood in his stool. He's had an EGD that was normal. He is also on temazepam for insomnia. He's been taking this medication for years. He would like to try to get off this medication because he knows it is habit forming. He realizes that he is chemically dependent to it and he would like to try to wean off. He is interested in non-addictive options. He is overdue for colonoscopy as well as for prostate cancer screening. He refuses the shingles vaccine today as well as the pneumonia vaccine given his smoking history. He is due for hepatitis C screening. He also reports hypersomnia. He follows asleep very easily just sitting up in a chair. Patient states that he sleeps in a chair every night because of he lays flat on his back he will stop breathing. Frequently throughout the night he will wake up gasping for air and less he sitting up to sleep. He also complains of fatigue and hypersomnolence. He denies any headache. Past Medical History:  Diagnosis Date  . Acid reflux   . Allergy   . Hypertension    Past Surgical History:  Procedure Laterality Date  . INCISION AND DRAINAGE Right 02/04/2013   Procedure: INCISION AND DRAINAGE;  Surgeon: Linna Hoff, MD;  Location: [place];  Service: Orthopedics;   Laterality: Right;  . OPEN REDUCTION INTERNAL FIXATION (ORIF) FINGER WITH RADIAL BONE GRAFT Right 02/04/2013   Procedure: OPEN REDUCTION INTERNAL FIXATION (ORIF) right ring and small fingers;  Surgeon: Linna Hoff, MD;  Location: [place];  Service: Orthopedics;  Laterality: Right;   Current Outpatient Prescriptions on File Prior to Visit  Medication Sig Dispense Refill  . diphenhydrAMINE (BENADRYL) 25 MG tablet Take 1 tablet (25 mg total) by mouth every 6 (six) hours. 20 tablet 0  . EPINEPHrine (EPIPEN 2-PAK) 0.3 mg/0.3 mL IJ SOAJ injection Inject 0.3 mLs (0.3 mg total) into the muscle once. For difficulty breathing or swallowing due to allergic reaction 1 Device 1  . esomeprazole (NEXIUM) 40 MG capsule Take 1 capsule (40 mg total) by mouth daily. 30 capsule 0  . metoprolol succinate (TOPROL-XL) 50 MG 24 hr tablet Take 50 mg by mouth daily. Take with or immediately following a meal.     No current facility-administered medications on file prior to visit.    Allergies  Allergen Reactions  . Asa [Aspirin] Other (See Comments)    Gi upset  . Bee Venom Swelling and Rash   Social History   Social History  . Marital status: Married    Spouse name: N/A  . Number of children: N/A  . Years of education: N/A   Occupational History  . Not on file.   Social History Main  Topics  . Smoking status: Former Research scientist (life sciences)  . Smokeless tobacco: Never Used  . Alcohol use No  . Drug use: No  . Sexual activity: Not on file   Other Topics Concern  . Not on file   Social History Narrative  . No narrative on file   Family History  Problem Relation Age of Onset  . Hyperlipidemia Mother   . Hyperlipidemia Father        Review of Systems  All other systems reviewed and are negative.      Objective:   Physical Exam  Constitutional: He is oriented to person, place, and time. He appears well-developed and well-nourished. No distress.  HENT:  Head: Normocephalic and atraumatic.  Right Ear:  External ear normal.  Left Ear: External ear normal.  Nose: Nose normal.  Mouth/Throat: Oropharynx is clear and moist. No oropharyngeal exudate.  Eyes: Conjunctivae and EOM are normal. Pupils are equal, round, and reactive to light. Right eye exhibits no discharge. Left eye exhibits no discharge. No scleral icterus.  Neck: Normal range of motion. Neck supple. No JVD present. No tracheal deviation present. No thyromegaly present.  Cardiovascular: Normal rate, regular rhythm, normal heart sounds and intact distal pulses.  Exam reveals no gallop and no friction rub.   No murmur heard. Pulmonary/Chest: Effort normal and breath sounds normal. No stridor. No respiratory distress. He has no wheezes. He has no rales. He exhibits no tenderness.  Abdominal: Soft. Bowel sounds are normal. He exhibits no distension and no mass. There is no tenderness. There is no rebound and no guarding.  Musculoskeletal: Normal range of motion. He exhibits no edema or tenderness.  Lymphadenopathy:    He has no cervical adenopathy.  Neurological: He is alert and oriented to person, place, and time. He has normal reflexes. He displays normal reflexes. No cranial nerve deficit. He exhibits normal muscle tone. Coordination normal.  Skin: Skin is warm. No rash noted. He is not diaphoretic. No erythema. No pallor.  Psychiatric: He has a normal mood and affect. His behavior is normal. Judgment and thought content normal.  Vitals reviewed.         Assessment & Plan:  Establishing care with new doctor, encounter for - Plan: CBC with Differential/Platelet, COMPLETE METABOLIC PANEL WITH GFR, Lipid panel, PSA  Benign essential HTN - Plan: CBC with Differential/Platelet, COMPLETE METABOLIC PANEL WITH GFR, Lipid panel  Prostate cancer screening - Plan: PSA  Encounter for hepatitis C screening test for low risk patient - Plan: Hepatitis C Ab Reflex HCV RNA, QUANT  I will obtain baseline lab work including a CBC, CMP, fasting  lipid panel, and a PSA. The patient's blood pressure today is elevated. He will check his blood pressure and his heart rate daily for the next week and notify me of the values. I'll screen the patient for prostate cancer with a PSA. He defers a colonoscopy at the present time. He will allow me to screen him for hepatitis C. He would also like to proceed with a referral to a sleep specialist for sleep study. He too is concerned that he has sleep apnea. He is afraid that this may be causing some of his fatigue as well as exacerbating his blood pressures. Therefore I will schedule the patient for a split-level sleep study. Recheck blood pressures in 2 weeks

## 2016-06-18 ENCOUNTER — Encounter: Payer: Self-pay | Admitting: Family Medicine

## 2016-06-18 LAB — CBC WITH DIFFERENTIAL/PLATELET
BASOS ABS: 0 {cells}/uL (ref 0–200)
BASOS PCT: 0 %
EOS PCT: 3 %
Eosinophils Absolute: 204 cells/uL (ref 15–500)
HCT: 44 % (ref 38.5–50.0)
HEMOGLOBIN: 14.4 g/dL (ref 13.0–17.0)
LYMPHS ABS: 1564 {cells}/uL (ref 850–3900)
Lymphocytes Relative: 23 %
MCH: 29.6 pg (ref 27.0–33.0)
MCHC: 32.7 g/dL (ref 32.0–36.0)
MCV: 90.3 fL (ref 80.0–100.0)
MPV: 9.4 fL (ref 7.5–12.5)
Monocytes Absolute: 612 cells/uL (ref 200–950)
Monocytes Relative: 9 %
NEUTROS ABS: 4420 {cells}/uL (ref 1500–7800)
Neutrophils Relative %: 65 %
Platelets: 269 10*3/uL (ref 140–400)
RBC: 4.87 MIL/uL (ref 4.20–5.80)
RDW: 13.3 % (ref 11.0–15.0)
WBC: 6.8 10*3/uL (ref 3.8–10.8)

## 2016-06-18 LAB — COMPLETE METABOLIC PANEL WITH GFR
ALT: 16 U/L (ref 9–46)
AST: 18 U/L (ref 10–35)
Albumin: 4 g/dL (ref 3.6–5.1)
Alkaline Phosphatase: 92 U/L (ref 40–115)
BILIRUBIN TOTAL: 0.3 mg/dL (ref 0.2–1.2)
BUN: 9 mg/dL (ref 7–25)
CO2: 28 mmol/L (ref 20–31)
Calcium: 8.8 mg/dL (ref 8.6–10.3)
Chloride: 104 mmol/L (ref 98–110)
Creat: 0.87 mg/dL (ref 0.70–1.25)
GFR, Est African American: >89 mL/min (ref >=60)
GFR, Est Non African American: >89 mL/min (ref >=60)
GLUCOSE: 99 mg/dL (ref 70–99)
Potassium: 4.2 mmol/L (ref 3.5–5.3)
SODIUM: 140 mmol/L (ref 135–146)
TOTAL PROTEIN: 6.2 g/dL (ref 6.1–8.1)

## 2016-06-18 LAB — LIPID PANEL
Cholesterol: 150 mg/dL (ref <200)
HDL: 41 mg/dL (ref >40)
LDL CALC: 99 mg/dL (ref <100)
Total CHOL/HDL Ratio: 3.7 Ratio (ref <5.0)
Triglycerides: 51 mg/dL (ref <150)
VLDL: 10 mg/dL (ref <30)

## 2016-06-18 LAB — HEPATITIS C ANTIBODY: HCV AB: NEGATIVE

## 2016-06-18 LAB — PSA: PSA: 1.1 ng/mL (ref <=4.0)

## 2016-07-03 ENCOUNTER — Encounter: Payer: Self-pay | Admitting: Family Medicine

## 2016-07-15 ENCOUNTER — Encounter: Payer: Self-pay | Admitting: Neurology

## 2016-07-15 ENCOUNTER — Ambulatory Visit (INDEPENDENT_AMBULATORY_CARE_PROVIDER_SITE_OTHER): Admitting: Neurology

## 2016-07-15 VITALS — BP 110/64 | HR 62 | Resp 16 | Ht 70.0 in | Wt 178.0 lb

## 2016-07-15 DIAGNOSIS — R0683 Snoring: Secondary | ICD-10-CM

## 2016-07-15 DIAGNOSIS — G2581 Restless legs syndrome: Secondary | ICD-10-CM | POA: Diagnosis not present

## 2016-07-15 DIAGNOSIS — G4719 Other hypersomnia: Secondary | ICD-10-CM

## 2016-07-15 DIAGNOSIS — G478 Other sleep disorders: Secondary | ICD-10-CM

## 2016-07-15 NOTE — Patient Instructions (Signed)

## 2016-07-15 NOTE — Progress Notes (Signed)
Subjective:    Patient ID: DEMITRIUS [name] is a 61 y.o. male.  HPI     Star Age, MD, PhD [facility] [address], [address].O. [place],  [address]  Dear Dr. Dennard Schaumann,  I saw your patient, Grant [name], upon your kind request in my neurologic clinic today for initial consultation of his sleep disorder, in particular, concern for underlying obstructive sleep apnea. The patient is unaccompanied today. As you know, [name] is a 61 year old right-handed gentleman with an underlying medical history of hypertension, reflux disease, allergies, and overweight state, who reports snoring and excessive daytime somnolence. I reviewed your office note from 06/17/2016. His Epworth sleepiness score is 16 out of 24, his fatigue score is 47 out of 63. He reports waking up with a sense of choking, difficulty falling asleep and sleep disruption. He estimates that he gets about 4-5 hours of sleep on any given day. He is married and lives with his wife. He has 4 children. He was a fairly heavy 1-2 pack per day smoker and quit in November 2017. He denies drinking alcohol or using illicit drugs. He denies utilizing caffeine on a day-to-day basis. He has been sleeping in a recliner for years, recently got a hospital twin bed and started sleeping in it, slightly elevated.  He had been on temazepam for about 3 years and recently weaned off of it.  He has had intermittent RLS symptoms. He has not been sleeping in the same bedroom with his wife due to both snoring.  He has had anxiety issues and has been taking OTC CBD oil.  Bedtime is between 11 and 12, he often takes melatonin for sleep. She denies night to night nocturia or morning headaches or family history of OSA. His wake up time is between 5:30 and 6 AM. He works part-time as an Clinical biochemist. He has a TV in the bedroom but does not rely on it staying on at night. They have one dog but the poodle does not sleep in his  bedroom.  His Past Medical History Is Significant For: Past Medical History:  Diagnosis Date  . Acid reflux   . Allergy   . Anxiety   . Hypertension   . Insomnia     His Past Surgical History Is Significant For: Past Surgical History:  Procedure Laterality Date  . INCISION AND DRAINAGE Right 02/04/2013   Procedure: INCISION AND DRAINAGE;  Surgeon: Linna Hoff, MD;  Location: [place];  Service: Orthopedics;  Laterality: Right;  . OPEN REDUCTION INTERNAL FIXATION (ORIF) FINGER WITH RADIAL BONE GRAFT Right 02/04/2013   Procedure: OPEN REDUCTION INTERNAL FIXATION (ORIF) right ring and small fingers;  Surgeon: Linna Hoff, MD;  Location: [place];  Service: Orthopedics;  Laterality: Right;    His Family History Is Significant For: Family History  Problem Relation Age of Onset  . Hyperlipidemia Mother   . Hyperlipidemia Father     His Social History Is Significant For: Social History   Social History  . Marital status: Married    Spouse name: N/A  . Number of children: 4  . Years of education: 12   Occupational History  . [employer]     Social History Main Topics  . Smoking status: Former Research scientist (life sciences)  . Smokeless tobacco: Never Used  . Alcohol use No  . Drug use: Yes    Types: Marijuana  . Sexual activity: Not Asked   Other Topics Concern  . None  Social History Narrative   Denies caffeine use     His Allergies Are:  Allergies  Allergen Reactions  . Asa [Aspirin] Other (See Comments)    Gi upset  . Bee Venom Swelling and Rash  :   His Current Medications Are:  Outpatient Encounter Prescriptions as of 07/15/2016  Medication Sig  . amLODipine (NORVASC) 10 MG tablet Take 1 tablet by mouth daily.  . Melatonin 10 MG TABS Take by mouth.  . metoprolol succinate (TOPROL-XL) 50 MG 24 hr tablet Take 50 mg by mouth daily. Take with or immediately following a meal.  . Multiple Vitamin (MULTIVITAMIN) capsule Take 1 capsule by mouth daily.  . NON FORMULARY Hemp  Complete (CBD) 15 drops  . temazepam (RESTORIL) 30 MG capsule Take 30 mg by mouth at bedtime as needed for sleep.  . [DISCONTINUED] omeprazole (PRILOSEC) 40 MG capsule Take 1 capsule (40 mg total) by mouth daily.  Marland Kitchen EPINEPHrine (EPIPEN 2-PAK) 0.3 mg/0.3 mL IJ SOAJ injection Inject 0.3 mLs (0.3 mg total) into the muscle once. For difficulty breathing or swallowing due to allergic reaction (Patient not taking: Reported on 07/15/2016)  . [DISCONTINUED] diphenhydrAMINE (BENADRYL) 25 MG tablet Take 1 tablet (25 mg total) by mouth every 6 (six) hours.  . [DISCONTINUED] esomeprazole (NEXIUM) 40 MG capsule Take 1 capsule (40 mg total) by mouth daily.   No facility-administered encounter medications on file as of 07/15/2016.   :  Review of Systems:  Out of a complete 14 point review of systems, all are reviewed and negative with the exception of these symptoms as listed below: Review of Systems  Neurological:       Patient has trouble falling and staying asleep, states that Melatonin does help some, snores, witnessed apnea, wakes up choking, wakes up feeling tired, daytime fatigue, may take a nap on the weekend.    Epworth Sleepiness Scale 0= would never doze 1= slight chance of dozing 2= moderate chance of dozing 3= high chance of dozing  Sitting and reading:3 Watching TV:3 Sitting inactive in a public place (ex. Theater or meeting):3 As a passenger in a car for an hour without a break:1 Lying down to rest in the afternoon:3 Sitting and talking to someone:1 Sitting quietly after lunch (no alcohol):2 In a car, while stopped in traffic:0 Total:16  Objective:  Neurologic Exam  Physical Exam Physical Examination:   Vitals:   07/15/16 0858  Resp: 16   General Examination: The patient is a very pleasant 61 y.o. male in no acute distress. He appears well-developed and well-nourished and adequately groomed.   HEENT: Normocephalic, atraumatic, pupils are equal, round and reactive to light and  accommodation. Funduscopic exam is normal with sharp disc margins noted. Extraocular tracking is good without limitation to gaze excursion or nystagmus noted. Normal smooth pursuit is noted. Hearing is grossly intact. Tympanic membranes are clear bilaterally. Face is symmetric with normal facial animation and normal facial sensation. Speech is clear with no dysarthria noted. There is no hypophonia. There is no lip, neck/head, jaw or voice tremor. Neck is supple with full range of passive and active motion. There are no carotid bruits on auscultation. Oropharynx exam reveals: mild mouth dryness, marginal dental hygiene with near edentulous state, and mild airway crowding, due to redundant soft palate. Mallampati is class I. Tongue protrudes centrally and palate elevates symmetrically. Tonsils are small or absent? Neck size is 16 inches.   Chest: Clear to auscultation without wheezing, rhonchi or crackles noted.  Heart: S1+S2+0, regular  and normal without murmurs, rubs or gallops noted.   Abdomen: Soft, non-tender and non-distended with normal bowel sounds appreciated on auscultation.  Extremities: There is no pitting edema in the distal lower extremities bilaterally. Pedal pulses are intact.  Skin: Warm and dry without trophic changes noted. There are no varicose veins.  Musculoskeletal: exam reveals no obvious joint deformities, tenderness or joint swelling or erythema.   Neurologically:  Mental status: The patient is awake, alert and oriented in all 4 spheres. His immediate and remote memory, attention, language skills and fund of knowledge are appropriate. There is no evidence of aphasia, agnosia, apraxia or anomia. Speech is clear with normal prosody and enunciation. Thought process is linear. Mood is normal and affect is normal.  Cranial nerves II - XII are as described above under HEENT exam. In addition: shoulder shrug is normal with equal shoulder height noted. Motor exam: Normal bulk,  strength and tone is noted. There is no drift, tremor or rebound. Romberg is negative. Reflexes are 2+ throughout. Fine motor skills and coordination: intact with normal finger taps, normal hand movements, normal rapid alternating patting, normal foot taps and normal foot agility.  Cerebellar testing: No dysmetria or intention tremor on finger to nose testing. Heel to shin is unremarkable bilaterally. There is no truncal or gait ataxia.  Sensory exam: intact to light touch in the upper and lower extremities.  Gait, station and balance: He stands easily. No veering to one side is noted. No leaning to one side is noted. Posture is age-appropriate and stance is narrow based. Gait shows normal stride length and normal pace. No problems turning are noted. Tandem walk is unremarkable.         Assessment and Plan:  In summary, NICCOLO BURGGRAF is a very pleasant 61 y.o.-year old male with a history and physical exam concerning for obstructive sleep apnea (OSA). I had a long chat with the patient about my findings and the diagnosis of OSA, its prognosis and treatment options. We talked about medical treatments, surgical interventions and non-pharmacological approaches. I explained in particular the risks and ramifications of untreated moderate to severe OSA, especially with respect to developing cardiovascular disease down the Road, including congestive heart failure, difficult to treat hypertension, cardiac arrhythmias, or stroke. Even type 2 diabetes has, in part, been linked to untreated OSA. Symptoms of untreated OSA include daytime sleepiness, memory problems, mood irritability and mood disorder such as depression and anxiety, lack of energy, as well as recurrent headaches, especially morning headaches. We talked about trying to maintain a healthy lifestyle in general, as well as the importance of weight control. I encouraged the patient to eat healthy, exercise daily and keep well hydrated, to keep a scheduled  bedtime and wake time routine, to not skip any meals and eat healthy snacks in between meals. I advised the patient not to drive when feeling sleepy. I recommended the following at this time: sleep study with potential positive airway pressure titration. (We will score hypopneas at 4%).   I explained the sleep test procedure to the patient and also outlined possible surgical and non-surgical treatment options of OSA, including the use of a custom-made dental device (which would require a referral to a specialist dentist or oral surgeon), upper airway surgical options, such as pillar implants, radiofrequency surgery, tongue base surgery, and UPPP (which would involve a referral to an ENT surgeon). Rarely, jaw surgery such as mandibular advancement may be considered.  I also explained the CPAP treatment option  to the patient, who indicated that he would be willing to try CPAP if the need arises. I explained the importance of being compliant with PAP treatment, not only for insurance purposes but primarily to improve His symptoms, and for the patient's long term health benefit, including to reduce His cardiovascular risks. I answered all his questions today and the patient was in agreement. I would like to see him back after the sleep study is completed and encouraged him to call with any interim questions, concerns, problems or updates.   Thank you very much for allowing me to participate in the care of this nice patient. If I can be of any further assistance to you please do not hesitate to call me at [phone].  Sincerely,   Star Age, MD, PhD

## 2016-07-31 ENCOUNTER — Ambulatory Visit (INDEPENDENT_AMBULATORY_CARE_PROVIDER_SITE_OTHER): Admitting: Neurology

## 2016-07-31 DIAGNOSIS — G4733 Obstructive sleep apnea (adult) (pediatric): Secondary | ICD-10-CM

## 2016-07-31 DIAGNOSIS — G4761 Periodic limb movement disorder: Secondary | ICD-10-CM

## 2016-07-31 DIAGNOSIS — G4720 Circadian rhythm sleep disorder, unspecified type: Secondary | ICD-10-CM

## 2016-08-02 NOTE — Progress Notes (Signed)
Patient referred by Dr. Dennard Schaumann, seen by me on 07/15/16, diagnostic PSG on 07/31/16.   Please call and notify the patient that the recent sleep study did not show any significant obstructive sleep apnea: he had an overall normal AHI and evidence of mild supine and REM related obstructive sleep apnea. For this, CPAP therapy is not warranted; avoidance of the supine supine sleep position along with weight loss are recommended. He did have severe PLMs (periodic limb movements of sleep) during the study with mild arousals; may benefit from Rx for RLS/PLMD. Please inform patient that I would like to go over the details of the study during a follow up appointment. Arrange a followup appointment. Also, route or fax report to PCP and referring MD, if other than PCP.  Once you have spoken to patient, you can close this encounter.   Thanks,  Star Age, MD, PhD [facility])

## 2016-08-02 NOTE — Procedures (Signed)
PATIENT'S NAME:  Grant [name], Grant [name] DOB:      1955-06-07      MR#:    [id number]     DATE OF RECORDING: 07/31/2016 REFERRING M.D.:  Jenna Luo, MD Study Performed:   Baseline Polysomnogram HISTORY: 61 year old man with a history of hypertension, reflux disease, allergies, and overweight state, who reports snoring and excessive daytime somnolence. The patient endorsed the Epworth Sleepiness Scale at 16 points. The patient's weight 178 pounds with a height of 70 (inches), resulting in a BMI of 25.6 kg/m2. The patient's neck circumference measured 16 inches.  CURRENT MEDICATIONS: Norvasc; Melatonin; Toprol XL; Multivitamin; Restoril;   PROCEDURE:  This is a multichannel digital polysomnogram utilizing the Somnostar 11.2 system.  Electrodes and sensors were applied and monitored per AASM Specifications.   EEG, EOG, Chin and Limb EMG, were sampled at 200 Hz.  ECG, Snore and Nasal Pressure, Thermal Airflow, Respiratory Effort, CPAP Flow and Pressure, Oximetry was sampled at 50 Hz. Digital video and audio were recorded.      BASELINE STUDY  Lights Out was at 22:53 and Lights On at 05:07.  Total recording time (TRT) was 374 minutes, with a total sleep time (TST) of  287 minutes.   The patient's sleep latency was 36 minutes.  REM latency was 72.5 minutes, which is normal. The sleep efficiency was 76.7 %.     SLEEP ARCHITECTURE: WASO (Wake after sleep onset) was 75 minutes with mild to moderate sleep fragmentation and a few longer periods of wakefulness noted.  There were 14.5 minutes in Stage N1, 181 minutes Stage N2, 48.5 minutes Stage N3 and 43 minutes in Stage REM.  The percentage of Stage N1 was 5.1%, Stage N2 was 63.1%, which is increased, Stage N3 was 16.9% and Stage R (REM sleep) was 15.%, which is mildly decreased.  The arousals were noted as: 24 were spontaneous, 44 were associated with PLMs, 4 were associated with respiratory events.    Audio and video analysis did not show any abnormal or unusual  movements, behaviors, phonations or vocalizations. The patient took 1 bathroom break. Mild snoring was noted. The EKG was in keeping with normal sinus rhythm (NSR).  RESPIRATORY ANALYSIS:  There were a total of 10 respiratory events:  5 obstructive apneas, 0 central apneas and 0 mixed apneas with a total of 5 apneas and an apnea index (AI) of 1. /hour. There were 5 hypopneas with a hypopnea index of 1. /hour. The patient also had 0 respiratory event related arousals (RERAs).      The total APNEA/HYPOPNEA INDEX (AHI) was 2.1/hour and the total RESPIRATORY DISTURBANCE INDEX was 2.1 /hour.  10 events occurred in REM sleep and 0 events in NREM. The REM AHI was 14. /hour, versus a non-REM AHI of 0. The patient spent 87.5 minutes of total sleep time in the supine position and 200 minutes in non-supine.. The supine AHI was 6.8 versus a non-supine AHI of 0.0.  OXYGEN SATURATION & C02:  The Wake baseline 02 saturation was 95%, with the lowest being 85%. Time spent below 89% saturation equaled 9 minutes.  PERIODIC LIMB MOVEMENTS: The patient had a total of 314 Periodic Limb Movements.  The Periodic Limb Movement (PLM) index was 65.6 and the PLM Arousal index was 9.2/hour.   Post-study, the patient indicated that sleep was the same as usual.   IMPRESSION:  1. Obstructive Sleep Apnea (OSA), positional and REM related 2. Periodic Limb Movement Disorder (PLMD) 3. Dysfunctions associated with sleep stages or  arousal from sleep  RECOMMENDATIONS:  1. This study demonstrates an overall normal AHI and evidence of mild supine and REM related obstructive sleep apnea. For this, CPAP therapy is not warranted; avoidance of the supine supine sleep position along with weight loss are recommended.  2. Severe PLMs (periodic limb movements of sleep) were noted during the study with mild arousals; clinical correlation is recommended.  3. This study shows sleep fragmentation and abnormal sleep stage percentages; these are  nonspecific findings and per se do not signify an intrinsic sleep disorder or a cause for the patient's sleep-related symptoms. Causes include (but are not limited to) the first night effect of the sleep study, circadian rhythm disturbances, medication effect or an underlying mood disorder or medical problem.  4. The patient should be cautioned not to drive, work at heights, or operate dangerous or heavy equipment when tired or sleepy. Review and reiteration of good sleep hygiene measures should be pursued with any patient. 5. The patient will be seen in follow-up by Dr. Rexene Alberts at [facility] for discussion of the test results and further management strategies. The referring provider will be notified of the test results.  I certify that I have reviewed the entire raw data recording prior to the issuance of this report in accordance with the Standards of Accreditation of the American Academy of Sleep Medicine (AASM)   Star Age, MD, PhD Diplomat, American Board of Psychiatry and Neurology (Neurology and Sleep Medicine)

## 2016-08-06 ENCOUNTER — Telehealth: Payer: Self-pay

## 2016-08-06 NOTE — Telephone Encounter (Signed)
I spoke to patient and he is aware of results and recommendations. He was able to make f/u appt. I will send copy of report to PCP.

## 2016-08-06 NOTE — Telephone Encounter (Signed)
-----   Message from Star Age, MD sent at 08/02/2016  3:53 PM EDT ----- Patient referred by Dr. Dennard Schaumann, seen by me on 07/15/16, diagnostic PSG on 07/31/16.   Please call and notify the patient that the recent sleep study did not show any significant obstructive sleep apnea: he had an overall normal AHI and evidence of mild supine and REM related obstructive sleep apnea. For this, CPAP therapy is not warranted; avoidance of the supine supine sleep position along with weight loss are recommended. He did have severe PLMs (periodic limb movements of sleep) during the study with mild arousals; may benefit from Rx for RLS/PLMD. Please inform patient that I would like to go over the details of the study during a follow up appointment. Arrange a followup appointment. Also, route or fax report to PCP and referring MD, if other than PCP.  Once you have spoken to patient, you can close this encounter.   Thanks,  Star Age, MD, PhD [facility])

## 2016-08-19 ENCOUNTER — Ambulatory Visit (INDEPENDENT_AMBULATORY_CARE_PROVIDER_SITE_OTHER): Admitting: Neurology

## 2016-08-19 ENCOUNTER — Encounter: Payer: Self-pay | Admitting: Neurology

## 2016-08-19 VITALS — BP 133/80 | HR 60 | Ht 69.0 in | Wt 175.5 lb

## 2016-08-19 DIAGNOSIS — G478 Other sleep disorders: Secondary | ICD-10-CM | POA: Diagnosis not present

## 2016-08-19 DIAGNOSIS — G2581 Restless legs syndrome: Secondary | ICD-10-CM | POA: Diagnosis not present

## 2016-08-19 DIAGNOSIS — G4761 Periodic limb movement disorder: Secondary | ICD-10-CM | POA: Diagnosis not present

## 2016-08-19 MED ORDER — ROPINIROLE HCL 0.25 MG PO TABS: ORAL_TABLET | ORAL | 3 refills | Status: DC

## 2016-08-19 NOTE — Patient Instructions (Addendum)
Your sleep study did not show any significant obstructive sleep apnea, try to avoid sleeping on your back.  You had severe leg twitching in sleep, which did disturb your sleep.  You also endorse symptoms in keeping with restless legs symptoms. Keep in mind restless legs syndrome (RLS) is associated with anemia and iron deficiency; we will check blood work for this and call you with the results.   Please remember to try to maintain good sleep hygiene, which means: Keep a regular sleep and wake schedule, try not to exercise or have a meal within 2 hours of your bedtime, try to keep your bedroom conducive for sleep, that is, cool and dark, without light distractors such as an illuminated alarm clock, and refrain from watching TV right before sleep or in the middle of the night and do not keep the TV or radio on during the night. Also, try not to use or play on electronic devices at bedtime, such as your cell phone, tablet PC or laptop. If you like to read at bedtime on an electronic device, try to dim the background light as much as possible. Do not eat in the middle of the night.   We will try Requip (generic name: ropinirole) 0.25 mg: Take one pill each night 1 week, then 2 pills each night for 1 week, then 3 pills each night for 1 week, then 4 pills each night thereafter. Take 90-120 minutes before projected bedtime. Common side effects reported are: Sedation, sleepiness, nausea, vomiting, and rare side effects are confusion, hallucinations, swelling in legs, and abnormal behaviors, including impulse control problems, which can manifest as excessive eating, obsessions with food or gambling, or hypersexuality.  Please call in about 3-4 weeks for an update, or better yet, email [place] through My Chart for an update, at which point we may send your Rx to [facility] and we can adjust the dosing.

## 2016-08-19 NOTE — Progress Notes (Signed)
Subjective:    Patient ID: Grant [name] is a 62 y.o. male.  HPI     Interim history:   Grant [name] is a 61 year old right-handed gentleman with an underlying medical history of hypertension, reflux disease, allergies, and overweight state, who presents for follow up consultation of Grant [name] sleep disorder, after recent sleep study testing. The patient is unaccompanied today. I first met Grant [name] on 07/15/2016 at the request of Grant [name] primary care physician, at which time the patient reported snoring and excessive daytime somnolence, difficulty falling asleep, sleep disruption. He was invited for sleep study. He had a baseline sleep study on 07/31/2016. I went over Grant [name] test results with Grant [name] in detail today. Sleep efficiency was 76.7%, sleep latency 36 minutes, wake after sleep onset was 75 minutes with mild to moderate sleep fragmentation noted. He had an increased percentage of stage II sleep, slow-wave sleep at 16.9%, REM sleep was 15%. He had mild snoring. EKG showed normal sinus rhythm. Total AHI was 2.1 per hour, REM AHI was 14 per hour, supine AHI was 6.8 per hour. Average oxygen saturation was 95%, nadir was 85%. He had severe PLMS with an index of 65.6 per hour, associated with mild arousals of 9.2 per hour.  Today, 08/19/2016 (all dictated new, as well as above notes, some dictation done in note pad or Word, outside of chart, may appear as copied):  He reports doing okay, but once he came off of the temazepam he had more restless legs symptoms at night and was twitching more with Grant [name] legs in Grant [name] sleep. Of note, in the past, he recalls having tried gabapentin, perhaps for back pain or maybe for Grant [name] legs but he had side effects including suicidal ideations. He also tried Lyrica in the past with similar but less intense side effects. He has never been on ropinirole or Mirapex or Neupro. He does not have a history of anemia but does have hemorrhoids and has occasional bright blood in Grant [name] stool.  Previously  (copied from previous notes for reference):   07/15/2016: He reports snoring and excessive daytime somnolence. I reviewed your office note from 06/17/2016. Grant [name] Epworth sleepiness score is 16 out of 24, Grant [name] fatigue score is 47 out of 63. He reports waking up with a sense of choking, difficulty falling asleep and sleep disruption. He estimates that he gets about 4-5 hours of sleep on any given day. He is married and lives with Grant [name] wife. He has 4 children. He was a fairly heavy 1-2 pack per day smoker and quit in November 2017. He denies drinking alcohol or using illicit drugs. He denies utilizing caffeine on a day-to-day basis. He has been sleeping in a recliner for years, recently got a hospital twin bed and started sleeping in it, slightly elevated.  He had been on temazepam for about 3 years and recently weaned off of it.  He has had intermittent RLS symptoms. He has not been sleeping in the same bedroom with Grant [name] wife due to both snoring.  He has had anxiety issues and has been taking OTC CBD oil.  Bedtime is between 11 and 12, he often takes melatonin for sleep. She denies night to night nocturia or morning headaches or family history of OSA. Grant [name] wake up time is between 5:30 and 6 AM. He works part-time as an Clinical biochemist. He has a TV in the bedroom but does not rely on it staying on at night. They have one dog but the poodle does not sleep in  Grant [name] bedroom. history, past surgical history and problem list were reviewed and updated as appropriate.    Grant [name] Past Medical History Is Significant For: Past Medical History:  Diagnosis Date  . Acid reflux   . Allergy   . Anxiety   . Hypertension   . Insomnia     Grant [name] Past Surgical History Is Significant For: Past Surgical History:  Procedure Laterality Date  . INCISION AND DRAINAGE Right 02/04/2013   Procedure: INCISION AND DRAINAGE;  Surgeon: Linna Hoff, MD;  Location: [place];  Service: Orthopedics;  Laterality: Right;  . OPEN REDUCTION INTERNAL  FIXATION (ORIF) FINGER WITH RADIAL BONE GRAFT Right 02/04/2013   Procedure: OPEN REDUCTION INTERNAL FIXATION (ORIF) right ring and small fingers;  Surgeon: Linna Hoff, MD;  Location: [place];  Service: Orthopedics;  Laterality: Right;    Grant [name] Family History Is Significant For: Family History  Problem Relation Age of Onset  . Hyperlipidemia Mother   . Hyperlipidemia Father     Grant [name] Social History Is Significant For: Social History   Social History  . Marital status: Married    Spouse name: N/A  . Number of children: 4  . Years of education: 12   Occupational History  . [employer]     Social History Main Topics  . Smoking status: Former Research scientist (life sciences)  . Smokeless tobacco: Never Used  . Alcohol use No  . Drug use: Yes    Types: Marijuana  . Sexual activity: Not Asked   Other Topics Concern  . None   Social History Narrative   Denies caffeine use     Grant [name] Allergies Are:  Allergies  Allergen Reactions  . Asa [Aspirin] Other (See Comments)    Gi upset  . Bee Venom Swelling and Rash  :   Grant [name] Current Medications Are:  Outpatient Encounter Prescriptions as of 08/19/2016  Medication Sig  . amLODipine (NORVASC) 10 MG tablet Take 1 tablet by mouth daily.  Marland Kitchen EPINEPHrine (EPIPEN 2-PAK) 0.3 mg/0.3 mL IJ SOAJ injection Inject 0.3 mLs (0.3 mg total) into the muscle once. For difficulty breathing or swallowing due to allergic reaction  . Melatonin 10 MG TABS Take by mouth.  . metoprolol succinate (TOPROL-XL) 50 MG 24 hr tablet Take 50 mg by mouth daily. Take with or immediately following a meal.  . Multiple Vitamin (MULTIVITAMIN) capsule Take 1 capsule by mouth daily.  . NON FORMULARY Hemp Complete (CBD) 15 drops  . [DISCONTINUED] temazepam (RESTORIL) 30 MG capsule Take 30 mg by mouth at bedtime as needed for sleep.   No facility-administered encounter medications on file as of 08/19/2016.   :  Review of Systems:  Out of a complete 14 point review of systems, all are reviewed and  negative with the exception of these symptoms as listed below:  Review of Systems  Neurological:       Pt presents today to follow up on sleep study results. Wants to discuss medications to treat restlessness    Objective:  Neurologic Exam  Physical Exam Physical Examination:   Vitals:   08/19/16 1415  BP: 133/80  Pulse: 60   General Examination: The patient is a very pleasant 61 y.o. male in no acute distress. He appears well-developed and well-nourished and well groomed. Good spirits.   HEENT: Normocephalic, atraumatic, pupils are equal, round and reactive to light and accommodation. Funduscopic exam is normal with sharp disc margins noted. Extraocular tracking is good without limitation to gaze excursion or nystagmus noted. Normal smooth  pursuit is noted. Hearing is grossly intact. Face is symmetric with normal facial animation and normal facial sensation. Speech is clear with no dysarthria noted. There is no hypophonia. There is no lip, neck/head, jaw or voice tremor. Neck is supple with full range of passive and active motion. There are no carotid bruits on auscultation. Oropharynx exam reveals: mild mouth dryness, marginal dental hygiene with near edentulous state, and mild airway crowding. Mallampati is class I. Tongue protrudes centrally and palate elevates symmetrically.   Chest: Clear to auscultation without wheezing, rhonchi or crackles noted.  Heart: S1+S2+0, regular and normal without murmurs, rubs or gallops noted.   Abdomen: Soft, non-tender and non-distended with normal bowel sounds appreciated on auscultation.  Extremities: There is no pitting edema in the distal lower extremities bilaterally. Pedal pulses are intact.  Skin: Warm and dry without trophic changes noted. There are no varicose veins.  Musculoskeletal: exam reveals no obvious joint deformities, tenderness or joint swelling or erythema, s/p R foot pain.   Neurologically:  Mental status: The  patient is awake, alert and oriented in all 4 spheres. Grant [name] immediate and remote memory, attention, language skills and fund of knowledge are appropriate. There is no evidence of aphasia, agnosia, apraxia or anomia. Speech is clear with normal prosody and enunciation. Thought process is linear. Mood is normal and affect is normal.  Cranial nerves II - XII are as described above under HEENT exam. Motor exam: Normal bulk, strength and tone is noted. There is no drift, tremor or rebound. Romberg is negative. Reflexes are 2+ throughout. Fine motor skills and coordination: intact with normal finger taps, normal hand movements, normal rapid alternating patting, normal foot taps and normal foot agility.  Cerebellar testing: No dysmetria or intention tremor.  Sensory exam: intact to light touch in the upper and lower extremities.  Gait, station and balance: He stands easily. No veering to one side is noted. No leaning to one side is noted. Posture is age-appropriate and stance is narrow based. Gait shows normal stride length and normal pace. No problems turning are noted. Tandem walk is unremarkable.         Assessment and Plan:  In summary, DOMENIK TRICE is a very pleasant 61 year old male with an underlying medical history of hypertension, reflux disease, allergies, and overweight state, who presents for follow up consultation of Grant [name] sleep disorder, after recent sleep study testing.  He is advised Grant [name] recent sleep study did not show any  significant obstructive sleep apnea with the  Exception of mild REM related obstructive sleep apnea particularly when sleeping on Grant [name] back for  Which he is advised to avoid sleeping on Grant [name] back.  Grant [name] history is suggestive of restless leg syndrome and he had severe PLMS with some arousals during  Grant [name] baseline sleep study in May 2018.We talked about Grant [name] test  Results in detail today. Physical exam is stable. He is trying to lose weight.  I suggested we proceed with blood testing to  look for iron deficiency and low ferritin as this could cause exacerbation of restless leg syndrome and PLMS. In addition, I would like for Grant [name] to start ropinirole 0.25 mg strength with gradual titration to up to 1 mg each night. We talked about potential side effects, expectations and the titration. He was given written instructions and a new prescription. I would like for Grant [name] to touch base with [place] via phone call or email through my chart in about a month for an updated which time  we can change Grant [name] prescription to 1 mg strength and also request a [facility] day [facility]. I encouraged the patient to eat healthy, exercise daily and keep well hydrated, to keep a scheduled bedtime and wake time routine, to not skip any meals and eat healthy snacks in between meals. I will see Grant [name] back in about 3 months. I answered all Grant [name] questions today and the patient was in agreement.  I spent 25 minutes in total face-to-face time with the patient, more than 50% of which was spent in counseling and coordination of care, reviewing test results, reviewing medication and discussing or reviewing the diagnosis of PLMD, RLS, the prognosis and treatment options. Pertinent laboratory and imaging test results that were available during this visit with the patient were reviewed by me and considered in my medical decision making (see chart for details).

## 2016-08-20 ENCOUNTER — Telehealth: Payer: Self-pay | Admitting: *Deleted

## 2016-08-20 LAB — IRON AND TIBC
IRON: 78 ug/dL (ref 38–169)
Iron Saturation: 22 % (ref 15–55)
TIBC: 360 ug/dL (ref 250–450)
UIBC: 282 ug/dL (ref 111–343)

## 2016-08-20 LAB — FERRITIN: Ferritin: 175 ng/mL (ref 30–400)

## 2016-08-20 NOTE — Progress Notes (Signed)
Iron studies look good, proceed with requip titration as discussed during recent appt.  Please notify pt. thx Star Age, MD, PhD [facility])

## 2016-08-20 NOTE — Telephone Encounter (Signed)
Called and spoke with pt. Advised iron studies look good and to proceed with requip titration as discussed at office visit. Pt verbalized understanding.

## 2016-08-20 NOTE — Telephone Encounter (Signed)
-----   Message from Star Age, MD sent at 08/20/2016  3:51 PM EDT ----- Iron studies look good, proceed with requip titration as discussed during recent appt.  Please notify pt. thx Star Age, MD, PhD [facility])

## 2016-09-21 ENCOUNTER — Encounter: Payer: Self-pay | Admitting: Neurology

## 2016-10-27 ENCOUNTER — Encounter: Payer: Self-pay | Admitting: Neurology

## 2016-11-08 ENCOUNTER — Observation Stay (HOSPITAL_COMMUNITY)
Admission: EM | Admit: 2016-11-08 | Discharge: 2016-11-09 | Disposition: A | Discharge status: Home / Self Care | Attending: Orthopedic Surgery | Admitting: Orthopedic Surgery

## 2016-11-08 ENCOUNTER — Encounter (HOSPITAL_COMMUNITY): Payer: Self-pay | Admitting: *Deleted

## 2016-11-08 DIAGNOSIS — Y9289 Other specified places as the place of occurrence of the external cause: Secondary | ICD-10-CM | POA: Insufficient documentation

## 2016-11-08 DIAGNOSIS — Z886 Allergy status to analgesic agent status: Secondary | ICD-10-CM | POA: Insufficient documentation

## 2016-11-08 DIAGNOSIS — W548XXA Other contact with dog, initial encounter: Secondary | ICD-10-CM | POA: Diagnosis not present

## 2016-11-08 DIAGNOSIS — G4700 Insomnia, unspecified: Secondary | ICD-10-CM | POA: Insufficient documentation

## 2016-11-08 DIAGNOSIS — W540XXA Bitten by dog, initial encounter: Secondary | ICD-10-CM | POA: Insufficient documentation

## 2016-11-08 DIAGNOSIS — F419 Anxiety disorder, unspecified: Secondary | ICD-10-CM | POA: Insufficient documentation

## 2016-11-08 DIAGNOSIS — S51832A Puncture wound without foreign body of left forearm, initial encounter: Secondary | ICD-10-CM | POA: Insufficient documentation

## 2016-11-08 DIAGNOSIS — Y990 Civilian activity done for income or pay: Secondary | ICD-10-CM | POA: Insufficient documentation

## 2016-11-08 DIAGNOSIS — S51851A Open bite of right forearm, initial encounter: Principal | ICD-10-CM | POA: Insufficient documentation

## 2016-11-08 DIAGNOSIS — Y9389 Activity, other specified: Secondary | ICD-10-CM | POA: Insufficient documentation

## 2016-11-08 DIAGNOSIS — Z87891 Personal history of nicotine dependence: Secondary | ICD-10-CM | POA: Diagnosis not present

## 2016-11-08 DIAGNOSIS — I10 Essential (primary) hypertension: Secondary | ICD-10-CM | POA: Insufficient documentation

## 2016-11-08 DIAGNOSIS — Z79899 Other long term (current) drug therapy: Secondary | ICD-10-CM | POA: Insufficient documentation

## 2016-11-08 DIAGNOSIS — S7012XA Contusion of left thigh, initial encounter: Secondary | ICD-10-CM | POA: Insufficient documentation

## 2016-11-08 DIAGNOSIS — Z91030 Bee allergy status: Secondary | ICD-10-CM | POA: Diagnosis not present

## 2016-11-08 DIAGNOSIS — L0889 Other specified local infections of the skin and subcutaneous tissue: Secondary | ICD-10-CM | POA: Diagnosis not present

## 2016-11-08 DIAGNOSIS — K219 Gastro-esophageal reflux disease without esophagitis: Secondary | ICD-10-CM | POA: Diagnosis not present

## 2016-11-08 DIAGNOSIS — L089 Local infection of the skin and subcutaneous tissue, unspecified: Secondary | ICD-10-CM

## 2016-11-08 LAB — CBC WITH DIFFERENTIAL/PLATELET
Basophils Absolute: 0 10*3/uL (ref 0.0–0.1)
Basophils Relative: 0 %
EOS PCT: 2 %
Eosinophils Absolute: 0.2 10*3/uL (ref 0.0–0.7)
HCT: 37.8 % — ABNORMAL LOW (ref 39.0–52.0)
Hemoglobin: 12.8 g/dL — ABNORMAL LOW (ref 13.0–17.0)
LYMPHS ABS: 1.8 10*3/uL (ref 0.7–4.0)
Lymphocytes Relative: 20 %
MCH: 30.4 pg (ref 26.0–34.0)
MCHC: 33.9 g/dL (ref 30.0–36.0)
MCV: 89.8 fL (ref 78.0–100.0)
MONO ABS: 0.7 10*3/uL (ref 0.1–1.0)
Monocytes Relative: 8 %
Neutro Abs: 6.1 10*3/uL (ref 1.7–7.7)
Neutrophils Relative %: 70 %
Platelets: 252 10*3/uL (ref 150–400)
RBC: 4.21 MIL/uL — ABNORMAL LOW (ref 4.22–5.81)
RDW: 12.3 % (ref 11.5–15.5)
WBC: 8.7 10*3/uL (ref 4.0–10.5)

## 2016-11-08 LAB — BASIC METABOLIC PANEL
Anion gap: 9 (ref 5–15)
BUN: 7 mg/dL (ref 6–20)
CALCIUM: 8.9 mg/dL (ref 8.9–10.3)
CHLORIDE: 102 mmol/L (ref 101–111)
CO2: 27 mmol/L (ref 22–32)
CREATININE: 0.9 mg/dL (ref 0.61–1.24)
GFR calc Af Amer: >60 mL/min (ref >60)
GFR calc non Af Amer: >60 mL/min (ref >60)
Glucose, Bld: 97 mg/dL (ref 65–99)
Potassium: 3.1 mmol/L — ABNORMAL LOW (ref 3.5–5.1)
Sodium: 138 mmol/L (ref 135–145)

## 2016-11-08 MED ORDER — LIDOCAINE HCL (PF) 1 % IJ SOLN
INTRAMUSCULAR | Status: AC
  Administered 2016-11-08: 10 mL
  Filled 2016-11-08: qty 10

## 2016-11-08 MED ORDER — POTASSIUM CHLORIDE CRYS ER 20 MEQ PO TBCR
40.0000 meq | EXTENDED_RELEASE_TABLET | Freq: Once | ORAL | Status: AC
  Administered 2016-11-08: 40 meq via ORAL
  Filled 2016-11-08: qty 2

## 2016-11-08 MED ORDER — SODIUM CHLORIDE 0.9 % IV BOLUS (SEPSIS)
1000.0000 mL | Freq: Once | INTRAVENOUS | Status: AC
  Administered 2016-11-08: 1000 mL via INTRAVENOUS

## 2016-11-08 MED ORDER — LIDOCAINE 1% INJECTION FOR CIRCUMCISION
10.0000 mL | INJECTION | Freq: Once | INTRAVENOUS | Status: AC
  Administered 2016-11-08: 10 mL via SUBCUTANEOUS
  Filled 2016-11-08: qty 10

## 2016-11-08 MED ORDER — SODIUM CHLORIDE 0.9 % IV SOLN
3.0000 g | Freq: Three times a day (TID) | INTRAVENOUS | Status: DC
  Administered 2016-11-08: 3 g via INTRAVENOUS
  Filled 2016-11-08 (×2): qty 3

## 2016-11-08 NOTE — ED Notes (Signed)
Patient presents to [facility] with increasing swelling to arm. Area of redness is extending outward past lines drawn in triage several hours ago.

## 2016-11-08 NOTE — ED Triage Notes (Signed)
Pt has redness & swelling to R arm, pts skin warm to touch, pt seen at [facility] this Tues for dog bites, pt has large amt of contusion to L upper leg, pt limping, pt has redness and white milky discharge from 1 .5 cm x 1 cm wound to R inner forearm, pt A&O x4

## 2016-11-08 NOTE — ED Provider Notes (Signed)
[facility] DEPT Provider Note   CSN: [id number] Arrival date & time: 11/08/16  1558     History   Chief Complaint No chief complaint on file.   HPI Grant [name] is a 61 y.o. male.  HPI  61 year old male presents with an infected right forearm. 2 days ago he was doing a job at [employer] and their dog bit him and knocked him over. He went to an urgent care where he had his wounds cleaned and a Steri-Strip placed over the right forearm puncture wound and was placed on Augmentin. Has been taking this as prescribed. This afternoon has noticed extending redness to the right forearm and has had pus coming out of the wound. No significant increase in pain. No fevers. The left thigh continues to be ecchymotic and hurts more than the right forearm. The left forearm wounds are unremarkable. The dog is currently an animal control.  Past Medical History:  Diagnosis Date  . Acid reflux   . Allergy   . Anxiety   . Hypertension   . Insomnia     Patient Active Problem List   Diagnosis Date Noted  . Dog bite 11/09/2016  . Insomnia     Past Surgical History:  Procedure Laterality Date  . INCISION AND DRAINAGE Right 02/04/2013   Procedure: INCISION AND DRAINAGE;  Surgeon: Linna Hoff, MD;  Location: [facility];  Service: Orthopedics;  Laterality: Right;  . OPEN REDUCTION INTERNAL FIXATION (ORIF) FINGER WITH RADIAL BONE GRAFT Right 02/04/2013   Procedure: OPEN REDUCTION INTERNAL FIXATION (ORIF) right ring and small fingers;  Surgeon: Linna Hoff, MD;  Location: [facility];  Service: Orthopedics;  Laterality: Right;       Home Medications    Prior to Admission medications   Medication Sig Start Date End Date Taking? Authorizing Provider  amLODipine (NORVASC) 10 MG tablet Take 1 tablet by mouth at bedtime.  03/14/16  Yes [provider]  amoxicillin-clavulanate (AUGMENTIN) 875-125 MG tablet Take 1 tablet by mouth 2 (two) times daily. 11/06/16 11/13/16 Yes [provider]  EPINEPHrine (EPIPEN 2-PAK) 0.3 mg/0.3 mL IJ SOAJ injection Inject 0.3 mLs (0.3 mg total) into the muscle once. For difficulty breathing or swallowing due to allergic reaction 09/15/14  Yes Rancour, Annie Main, MD  metoprolol succinate (TOPROL-XL) 50 MG 24 hr tablet Take 50 mg by mouth at bedtime. Take with or immediately following a meal.    Yes [provider]  omeprazole (PRILOSEC) 40 MG capsule Take 40 mg by mouth daily. 09/26/16  Yes [provider]  rOPINIRole (REQUIP) 0.25 MG tablet 1 pill nightly x 1 week, then 2 pills nightly x 1 w, then 3 pills nightly x 1 w, then 4 pills nightly thereafter. 90-120 min before bedtime. 08/19/16  Yes Star Age, MD    Family History Family History  Problem Relation Age of Onset  . Hyperlipidemia Mother   . Hyperlipidemia Father     Social History Social History  Substance Use Topics  . Smoking status: Former Research scientist (life sciences)  . Smokeless tobacco: Never Used  . Alcohol use No     Allergies   Asa [aspirin] and Bee venom   Review of Systems Review of Systems  Constitutional: Negative for fever.  Gastrointestinal: Negative for vomiting.  Skin: Positive for color change and wound.  All other systems reviewed and are negative.    Physical Exam Updated Vital Signs BP 125/63   Pulse 70   Temp 98.3 F (36.8 C) (Oral)  Resp 16   Ht 5\' 6"  (1.676 m)   Wt 83.9 kg (185 lb)   SpO2 99%   BMI 29.86 kg/m   Physical Exam  Constitutional: He is oriented to person, place, and time. He appears well-developed and well-nourished.  HENT:  Head: Normocephalic and atraumatic.  Right Ear: External ear normal.  Left Ear: External ear normal.  Nose: Nose normal.  Eyes: Right eye exhibits no discharge. Left eye exhibits no discharge.  Neck: Neck supple.  Cardiovascular: Normal rate and regular rhythm.   Pulses:      Radial pulses are 2+ on the right side.  Pulmonary/Chest: Effort normal.  Abdominal: He exhibits no distension.    Musculoskeletal: He exhibits no edema.  See images below Right volar forearm with diffuse erythema with warmth. At the small puncture wound there is a small amount of pus. No surrounding fluctuance. No significant tenderness. Unable to express more pus through the opening L thigh with extensive ecchymosis. Focal hematoma over puncture wound sights. No warmth Left volar forearm with small puncture wounds with no signs of infection  Neurological: He is alert and oriented to person, place, and time.  Skin: Skin is warm and dry.  Nursing note and vitals reviewed.        ED Treatments / Results  Labs (all labs ordered are listed, but only abnormal results are displayed) Labs Reviewed  BASIC METABOLIC PANEL - Abnormal; Notable for the following:       Result Value   Potassium 3.1 (*)    All other components within normal limits  CBC WITH DIFFERENTIAL/PLATELET - Abnormal; Notable for the following:    RBC 4.21 (*)    Hemoglobin 12.8 (*)    HCT 37.8 (*)    All other components within normal limits  AEROBIC CULTURE (SUPERFICIAL SPECIMEN)    EKG  EKG Interpretation None       Radiology No results found.  Procedures Procedures (including critical care time)  Medications Ordered in ED Medications  Ampicillin-Sulbactam (UNASYN) 3 g in sodium chloride 0.9 % 100 mL IVPB (0 g Intravenous Stopped 11/08/16 2315)  lactated ringers infusion (not administered)  oxyCODONE (Oxy IR/ROXICODONE) immediate release tablet 5-10 mg (not administered)  morphine 4 MG/ML injection 1 mg (not administered)  ALPRAZolam (XANAX) tablet 0.5 mg (not administered)  famotidine (PEPCID) tablet 20 mg (not administered)  vitamin C (ASCORBIC ACID) tablet 1,000 mg (not administered)  Ampicillin-Sulbactam (UNASYN) 3 g in sodium chloride 0.9 % 100 mL IVPB (not administered)  docusate sodium (COLACE) capsule 100 mg (not administered)  sodium chloride 0.9 % bolus 1,000 mL (0 mLs Intravenous Stopped 11/08/16  2319)  potassium chloride SA (K-DUR,KLOR-CON) CR tablet 40 mEq (40 mEq Oral Given 11/08/16 2333)  lidocaine 1 % injection for CIRC 10 mL (10 mLs Subcutaneous Given 11/08/16 2337)  lidocaine (PF) (XYLOCAINE) 1 % injection (10 mLs  Given 11/08/16 2333)     Initial Impression / Assessment and Plan / ED Course  I have reviewed the triage vital signs and the nursing notes.  Pertinent labs & imaging results that were available during my care of the patient were reviewed by me and considered in my medical decision making (see chart for details).     Dr Amedeo Plenty consulted. Tried I&D at bedside with minimal pus. Patient without systemic symptoms but with cellulitis while on antibiotics, will need admission for IV antibiotics. D/w hospitalist, who requests Dr. Amedeo Plenty admit given it is otherwise uncomplicated.   Final Clinical Impressions(s) / ED  Diagnoses   Final diagnoses:  Dog bite of right forearm with infection, initial encounter    New Prescriptions New Prescriptions   No medications on file     Sherwood Gambler, MD 11/09/16 [phone]

## 2016-11-08 NOTE — ED Notes (Signed)
Pt states he was attacked by a dog several days ago. Pt reports he was seen at [facility] and they cleaned out his wounds. Pt also advised they put a steri-strip on

## 2016-11-09 DIAGNOSIS — W540XXA Bitten by dog, initial encounter: Secondary | ICD-10-CM | POA: Diagnosis present

## 2016-11-09 DIAGNOSIS — S51851A Open bite of right forearm, initial encounter: Secondary | ICD-10-CM | POA: Diagnosis not present

## 2016-11-09 MED ORDER — ROPINIROLE HCL 0.5 MG PO TABS: 0.2500 mg | ORAL_TABLET | Freq: Every day | ORAL | Status: DC

## 2016-11-09 MED ORDER — AMLODIPINE BESYLATE 10 MG PO TABS: 10.0000 mg | ORAL_TABLET | Freq: Every day | ORAL | Status: DC

## 2016-11-09 MED ORDER — PANTOPRAZOLE SODIUM 40 MG PO TBEC
80.0000 mg | DELAYED_RELEASE_TABLET | Freq: Every day | ORAL | Status: DC
  Administered 2016-11-09: 80 mg via ORAL
  Filled 2016-11-09: qty 2

## 2016-11-09 MED ORDER — ALPRAZOLAM 0.5 MG PO TABS: 0.5000 mg | ORAL_TABLET | Freq: Four times a day (QID) | ORAL | Status: DC | PRN

## 2016-11-09 MED ORDER — DOCUSATE SODIUM 100 MG PO CAPS
100.0000 mg | ORAL_CAPSULE | Freq: Two times a day (BID) | ORAL | Status: DC
  Filled 2016-11-09: qty 1

## 2016-11-09 MED ORDER — AMOXICILLIN-POT CLAVULANATE 875-125 MG PO TABS: 1.0000 | ORAL_TABLET | Freq: Two times a day (BID) | ORAL | 0 refills | Status: DC

## 2016-11-09 MED ORDER — METOPROLOL SUCCINATE ER 50 MG PO TB24: 50.0000 mg | ORAL_TABLET | Freq: Every day | ORAL | Status: DC

## 2016-11-09 MED ORDER — MORPHINE SULFATE (PF) 4 MG/ML IV SOLN: 1.0000 mg | INTRAVENOUS | Status: DC | PRN

## 2016-11-09 MED ORDER — VITAMIN C 500 MG PO TABS
1000.0000 mg | ORAL_TABLET | Freq: Every day | ORAL | Status: DC
  Administered 2016-11-09: 1000 mg via ORAL
  Filled 2016-11-09: qty 2

## 2016-11-09 MED ORDER — OXYCODONE HCL 5 MG PO TABS: 5.0000 mg | ORAL_TABLET | ORAL | 0 refills | Status: AC | PRN

## 2016-11-09 MED ORDER — ROPINIROLE HCL 0.5 MG PO TABS
0.2500 mg | ORAL_TABLET | Freq: Three times a day (TID) | ORAL | Status: DC
  Administered 2016-11-09 (×2): 0.25 mg via ORAL
  Filled 2016-11-09 (×2): qty 1

## 2016-11-09 MED ORDER — FAMOTIDINE 20 MG PO TABS: 20.0000 mg | ORAL_TABLET | Freq: Two times a day (BID) | ORAL | Status: DC | PRN

## 2016-11-09 MED ORDER — OXYCODONE HCL 5 MG PO TABS: 5.0000 mg | ORAL_TABLET | ORAL | Status: DC | PRN

## 2016-11-09 MED ORDER — LACTATED RINGERS IV SOLN
INTRAVENOUS | Status: DC
  Administered 2016-11-09: 01:00:00 via INTRAVENOUS

## 2016-11-09 MED ORDER — SODIUM CHLORIDE 0.9 % IV SOLN
3.0000 g | Freq: Four times a day (QID) | INTRAVENOUS | Status: DC
  Administered 2016-11-09 (×3): 3 g via INTRAVENOUS
  Filled 2016-11-09 (×5): qty 3

## 2016-11-09 NOTE — Care Management Note (Signed)
Case Management Note  Patient Details  Name: Grant [name] MRN: [id number] Date of Birth: [birth year]  Subjective/Objective:                 Patient with order to DC to home today. Chart reviewed. No Home Health or Equipment needs, no unacknowledged Case Management consults or medication needs identified at the time of this note. Plan for DC to home. If needs arise today prior to discharge, please call Carles Collet RN CM at [phone].   Action/Plan:   Expected Discharge Date:  11/09/16               Expected Discharge Plan:  Home/Self Care  In-House Referral:     Discharge planning Services  CM Consult  Post Acute Care Choice:    Choice offered to:     DME Arranged:    DME Agency:     HH Arranged:    HH Agency:     Status of Service:  Completed, signed off  If discussed at [employer] of Stay Meetings, dates discussed:    Additional Comments:  Carles Collet, RN 11/09/2016, 11:06 AM

## 2016-11-09 NOTE — Discharge Summary (Signed)
Physician Discharge Summary  Patient ID: Grant [name] MRN: [id number] DOB/AGE: [birth year] 61 y.o.  Admit date: 11/08/2016 Discharge date: 11/09/2016  Admission Diagnoses:  multiple dog bites to include right forearm chest wall and left upper thigh Past Medical History:  Diagnosis Date  . Acid reflux   . Allergy   . Anxiety   . Hypertension   . Insomnia     Discharge Diagnoses:  Status post irrigation and excisional debridement right forearm wound Conservative treatment for chest wall bites as well as left thigh hematoma Active Problems:   Dog bite   Surgeries:  Status post irrigation and excisional debridement right forearm infectious dog bite     Consultants: none  Discharged Condition: Improved  Hospital Course: Grant [name] is an 61 y.o. male who was admitted 11/08/2016 with a chief complaint of  of multiple dog bites..  The patient had previously been seen and evaluated emergency room setting earlier in the week. Please see history and physical for full details. He was seen and evaluated and treated by the emergency room staff he was given Augmentin for antibiotic purposes. Unfortunately, had progressive pain and swelling, redness and purulent debris about the right forearm secondary to the dog bite wound. He presented to the emergency room once again yesterday at which juncture hand surgery was consulted. The patient was noted to have an infectious wound about the right forearm requiring excisional debridement and irrigation. This was performed without difficulties. Please see procedure note. In addition the patient was noted to have a large amount of ecchymosis about the upper medial thigh with an associated large hematoma He was noted have multiple chest wall excoriations and bite wounds. These underwent wound care and a compressive wrap was placed onto the upper left thigh. Bandages were placed about the chest wall. The patient was admitted for close observation and IV  antibiotics. Postoperative day #1 the patient was awake, alert and oriented. He was doing very well. He had received the news that the canine was a domesticated canine and the vaccinations were up-to-date. We had previously discussed these issues with animal control.  Overall the patient was feeling much better. He did describe some degree of pure right [place] about the forearm. He denied fever, chills, shortness of breath. He was tolerating a regular diet without difficulties. On physical examination of the right upper extremity his dressings were removed. He had no advance cellulitis present. The packing was removed from the wound. It is noted he had healthy granulation tissue present there is no purulent discharge present. Copious irrigation was implemented followed by wet-to-dry dressing changes. He had surrounding ecchymosis. We discussed with he and his wife at length at bedside the need for daily wet-to-dry dressing changes. They expressed comfortable in performing this wound care at home. Dressing supplies will be given to discharge. Examination of the left upper thigh showed no obvious cellulitis. As a large hematoma present with surrounding ecchymosis. We have discussed with him a compressive wrap, triple antibiotic ointment to be applied to the skin as well as compresses. We have discussed with him to keep a close watch on this area and given his high propensity for cellulitis. In regards to the chest wall injuries, he will clean these daily with soap and water apply a light topping of triple antibiotic ointment and cover this with soft dressings and paper tape. We will plan to see him this Tuesday for a wound check. He's been given an additional 10 days worth of Augmentin  for antibiotic purposes as we want him to complete a 2 week period of antibiotics. Pain medications were also prescribed in the form of oxycodone. He will contact us for any questions or concerns. He will perform his wound care as  instructed daily. All questions were encouraged and answered. We will plan on discharge later this evening after he receives additional noon and evening IV antibiotics.  They were given perioperative antibiotics: Anti-infectives    Start     Dose/Rate Route Frequency Ordered Stop   11/09/16 0400  Ampicillin-Sulbactam (UNASYN) 3 g in sodium chloride 0.9 % 100 mL IVPB     3 g 200 mL/hr over 30 Minutes Intravenous Every 6 hours 11/09/16 0016     11/09/16 0000  amoxicillin-clavulanate (AUGMENTIN) 875-125 MG tablet     1 tablet Oral 2 times daily 11/09/16 1103     11/08/16 2200  Ampicillin-Sulbactam (UNASYN) 3 g in sodium chloride 0.9 % 100 mL IVPB  Status:  Discontinued     3 g 200 mL/hr over 30 Minutes Intravenous Every 8 hours 11/08/16 2153 11/09/16 0049    .  They were given sequential compression devices, early ambulation for DVT prophylaxis.  Recent vital signs: Patient Vitals for the past 24 hrs:  BP Temp Temp src Pulse Resp SpO2 Height Weight  11/09/16 0440 (!) 114/57 99 F (37.2 C) Oral (!) 53 - 99 % - -  11/09/16 0119 123/62 98.8 F (37.1 C) Oral (!) 55 - 99 % - -  11/09/16 0020 125/63 - - 70 16 99 % - -  11/08/16 2330 112/76 - - 68 - 100 % - -  11/08/16 2315 130/72 - - (!) 50 - 97 % - -  11/08/16 2300 (!) 150/68 - - 86 - 100 % - -  11/08/16 2245 - - - (!) 50 - 99 % - -  11/08/16 2230 129/69 - - (!) 51 - 100 % - -  11/08/16 2215 129/70 - - (!) 53 16 96 % - -  11/08/16 1842 (!) 143/73 - - 62 16 100 % - -  11/08/16 1612 (!) 149/82 98.3 F (36.8 C) Oral 68 14 99 % 5\' 6"  (1.676 m) 83.9 kg (185 lb)  .  Recent laboratory studies: No results found.  Discharge Medications:   Allergies as of 11/09/2016      Reactions   Asa [aspirin] Other (See Comments)   Gi upset   Bee Venom Swelling, Rash      Medication List    TAKE these medications   amLODipine 10 MG tablet Commonly known as:  NORVASC Take 1 tablet by mouth at bedtime.   amoxicillin-clavulanate 875-125 MG  tablet Commonly known as:  AUGMENTIN Take 1 tablet by mouth 2 (two) times daily. What changed:  Another medication with the same name was added. Make sure you understand how and when to take each.   amoxicillin-clavulanate 875-125 MG tablet Commonly known as:  AUGMENTIN Take 1 tablet by mouth 2 (two) times daily. What changed:  You were already taking a medication with the same name, and this prescription was added. Make sure you understand how and when to take each.   EPINEPHrine 0.3 mg/0.3 mL Soaj injection Commonly known as:  EPIPEN 2-PAK Inject 0.3 mLs (0.3 mg total) into the muscle once. For difficulty breathing or swallowing due to allergic reaction   metoprolol succinate 50 MG 24 hr tablet Commonly known as:  TOPROL-XL Take 50 mg by mouth at bedtime. Take with or  immediately following a meal.   omeprazole 40 MG capsule Commonly known as:  PRILOSEC Take 40 mg by mouth daily.   oxyCODONE 5 MG immediate release tablet Commonly known as:  Oxy IR/ROXICODONE Take 1-2 tablets (5-10 mg total) by mouth every 4 (four) hours as needed for moderate pain.   rOPINIRole 0.25 MG tablet Commonly known as:  REQUIP 1 pill nightly x 1 week, then 2 pills nightly x 1 w, then 3 pills nightly x 1 w, then 4 pills nightly thereafter. 90-120 min before bedtime.            Discharge Care Instructions        Start     Ordered   11/09/16 0000  oxyCODONE (OXY IR/ROXICODONE) 5 MG immediate release tablet  Every 4 hours PRN    Question:  Supervising Provider  Answer:  Roseanne Kaufman   11/09/16 1103   11/09/16 0000  amoxicillin-clavulanate (AUGMENTIN) 875-125 MG tablet  2 times daily    Question:  Supervising Provider  Answer:  Roseanne Kaufman   11/09/16 1103   11/09/16 0000  Call MD / Call 911    Comments:  If you experience chest pain or shortness of breath, CALL 911 and be transported to the hospital emergency room.  If you develope a fever above 101 F, pus (white drainage) or increased  drainage or redness at the wound, or calf pain, call your surgeon's office.   11/09/16 1104   11/09/16 0000  Diet - low sodium heart healthy     11/09/16 1104   11/09/16 0000  Constipation Prevention    Comments:  Drink plenty of fluids.  Prune juice may be helpful.  You may use a stool softener, such as Colace (over the counter) 100 mg twice a day.  Use MiraLax (over the counter) for constipation as needed.   11/09/16 1104   11/09/16 0000  Increase activity slowly as tolerated     11/09/16 1104   11/09/16 0000  Discharge instructions    Comments:  Keep bandage clean and dry.  Call for any problems.  No smoking.  Criteria for driving a car: you should be off your pain medicine for 7-8 hours, able to drive one handed(confident), thinking clearly and feeling able in your judgement to drive. Continue elevation as it will decrease swelling.  If instructed by MD move your fingers within the confines of the bandage/splint.  Use ice if instructed by your MD. Call immediately for any sudden loss of feeling in your hand/arm or change in functional abilities of the extremity.We recommend that you to take vitamin C 1000 mg a day to promote healing. We also recommend that if you require  pain medicine that you take a stool softener to prevent constipation as most pain medicines will have constipation side effects. We recommend either Peri-Colace or Senokot and recommend that you also consider adding MiraLAX as well to prevent the constipation affects from pain medicine if you are required to use them. These medicines are over the counter and may be purchased at a local pharmacy. A cup of yogurt and a probiotic can also be helpful during the recovery process as the medicines can disrupt your intestinal environment.   11/09/16 1104      Diagnostic Studies: No results found.  They benefited maximally from their hospital stay and there were no complications.     Disposition: 01-Home or Self Care Discharge  Instructions    Call MD / Call 911  Complete by:  As directed    If you experience chest pain or shortness of breath, CALL 911 and be transported to the hospital emergency room.  If you develope a fever above 101 F, pus (white drainage) or increased drainage or redness at the wound, or calf pain, call your surgeon's office.   Constipation Prevention    Complete by:  As directed    Drink plenty of fluids.  Prune juice may be helpful.  You may use a stool softener, such as Colace (over the counter) 100 mg twice a day.  Use MiraLax (over the counter) for constipation as needed.   Diet - low sodium heart healthy    Complete by:  As directed    Discharge instructions    Complete by:  As directed    Keep bandage clean and dry.  Call for any problems.  No smoking.  Criteria for driving a car: you should be off your pain medicine for 7-8 hours, able to drive one handed(confident), thinking clearly and feeling able in your judgement to drive. Continue elevation as it will decrease swelling.  If instructed by MD move your fingers within the confines of the bandage/splint.  Use ice if instructed by your MD. Call immediately for any sudden loss of feeling in your hand/arm or change in functional abilities of the extremity.We recommend that you to take vitamin C 1000 mg a day to promote healing. We also recommend that if you require  pain medicine that you take a stool softener to prevent constipation as most pain medicines will have constipation side effects. We recommend either Peri-Colace or Senokot and recommend that you also consider adding MiraLAX as well to prevent the constipation affects from pain medicine if you are required to use them. These medicines are over the counter and may be purchased at a local pharmacy. A cup of yogurt and a probiotic can also be helpful during the recovery process as the medicines can disrupt your intestinal environment.   Increase activity slowly as tolerated    Complete  by:  As directed      Follow-up Information    Roseanne Kaufman, MD Follow up.   Specialty:  Orthopedic Surgery Why:  please follow up in our office this Tuesday at 8 AM. Call for questions or concerns. Contact information: [address] [place] [address] [phone]            Signed: Ivan Croft 11/09/2016, 11:05 AM

## 2016-11-09 NOTE — H&P (Signed)
Grant [name] is an 61 y.o. male.   Chief Complaint: Dog bites sustained Wednesday to the chest wall right and left forearms and arms and left by with noted early infection about the right forearm HPI: Presents for evaluation and treatment of his upper and lower extremities. Patient has a history of a dog bite Wednesday. He was placed on Augmentin. Unfortunately his right forearm his had some discharge in the form of a power rule and discharge and cellulitic change. I was asked to see him.  Interestingly this was a significant dog mauling with injuries to his chest wall, right and left arms, left thigh.  Patient denies shortness of breath neck or back pain at present time. Patient is examined at length. He was seen by the ER and previously seen by other outside facility. He complains of pain throughout the upper and lower extremities.  This was a domesticated dog that became quite violent according to his report.  Past Medical History:  Diagnosis Date  . Acid reflux   . Allergy   . Anxiety   . Hypertension   . Insomnia     Past Surgical History:  Procedure Laterality Date  . INCISION AND DRAINAGE Right 02/04/2013   Procedure: INCISION AND DRAINAGE;  Surgeon: Linna Hoff, MD;  Location: [place];  Service: Orthopedics;  Laterality: Right;  . OPEN REDUCTION INTERNAL FIXATION (ORIF) FINGER WITH RADIAL BONE GRAFT Right 02/04/2013   Procedure: OPEN REDUCTION INTERNAL FIXATION (ORIF) right ring and small fingers;  Surgeon: Linna Hoff, MD;  Location: [place];  Service: Orthopedics;  Laterality: Right;    Family History  Problem Relation Age of Onset  . Hyperlipidemia Mother   . Hyperlipidemia Father    Social History:  reports that he has quit smoking. He has never used smokeless tobacco. He reports that he uses drugs, including Marijuana. He reports that he does not drink alcohol.  Allergies:  Allergies  Allergen Reactions  . Asa [Aspirin] Other (See Comments)    Gi upset  . Bee  Venom Swelling and Rash     (Not in a hospital admission)  Results for orders placed or performed during the hospital encounter of 11/08/16 (from the past 48 hour(s))  Basic metabolic panel     Status: Abnormal   Collection Time: 11/08/16  9:28 PM  Result Value Ref Range   Sodium 138 135 - 145 mmol/L   Potassium 3.1 (L) 3.5 - 5.1 mmol/L   Chloride 102 101 - 111 mmol/L   CO2 27 22 - 32 mmol/L   Glucose, Bld 97 65 - 99 mg/dL   BUN 7 6 - 20 mg/dL   Creatinine, Ser 0.90 0.61 - 1.24 mg/dL   Calcium 8.9 8.9 - 10.3 mg/dL   GFR calc non Af Amer >60 >60 mL/min   GFR calc Af Amer >60 >60 mL/min    Comment: (NOTE) The eGFR has been calculated using the CKD EPI equation. This calculation has not been validated in all clinical situations. eGFR's persistently <60 mL/min signify possible Chronic Kidney Disease.    Anion gap 9 5 - 15  CBC with Differential     Status: Abnormal   Collection Time: 11/08/16  9:28 PM  Result Value Ref Range   WBC 8.7 4.0 - 10.5 K/uL   RBC 4.21 (L) 4.22 - 5.81 MIL/uL   Hemoglobin 12.8 (L) 13.0 - 17.0 g/dL   HCT 37.8 (L) 39.0 - 52.0 %   MCV 89.8 78.0 - 100.0 fL  MCH 30.4 26.0 - 34.0 pg   MCHC 33.9 30.0 - 36.0 g/dL   RDW 12.3 11.5 - 15.5 %   Platelets 252 150 - 400 K/uL   Neutrophils Relative % 70 %   Neutro Abs 6.1 1.7 - 7.7 K/uL   Lymphocytes Relative 20 %   Lymphs Abs 1.8 0.7 - 4.0 K/uL   Monocytes Relative 8 %   Monocytes Absolute 0.7 0.1 - 1.0 K/uL   Eosinophils Relative 2 %   Eosinophils Absolute 0.2 0.0 - 0.7 K/uL   Basophils Relative 0 %   Basophils Absolute 0.0 0.0 - 0.1 K/uL   No results found.  Review of Systems  Respiratory: Negative.   Cardiovascular: Negative.   Gastrointestinal: Negative.   Genitourinary: Negative.     Blood pressure 129/70, pulse (!) 53, temperature 98.3 F (36.8 C), temperature source Oral, resp. rate 16, height '5\' 6"'  (1.676 m), weight 83.9 kg (185 lb), SpO2 96 %. Physical Exam  Multiple dog bites about the  right and left forearms and upper arms.  Unfortunately the right forearm has a volar ulna bite with infectious material. This will necessitate a surgical I and D. He has surrounding erythema and cellulitis.  He has a chest wall injury with bruising and ecchymosis and a large dog bite. This is not erythematous or acutely infected but is quite dramatic and painful.  His thigh has a 16 x 12" area of ecchymosis without cellulitis erythema or compartment syndrome. There is a hematoma here. It is quite sore.  No signs of DVT infection or dystrophy at present time in the lower extremities.  Certainly the right forearm has an infectious appearance.  Procedure note: patient underwent a lidocaine administration followed by removal of devitalized skin tissue followed by irrigation debridement of skin's obtained tissue muscle and associated deep tissue. This was cultured. He tolerated this well copious lavage and packing of the wound was accomplished followed by sterile dressing.  I placed bacitracin over the thigh forearm and chest wall as well as dressing. The patient is alert and oriented in no acute distress. The patient complains of pain in the affected upper extremity.  The patient is noted to have a normal HEENT exam. Lung fields show equal chest expansion and no shortness of breath. Abdomen exam is nontender without distention. Lower extremity examination does not show any fracture dislocation or blood clot symptoms. Pelvis is stable and the neck and back are stable and nontender.  Assessment/Plan Multiple dog bites to the right and left forearms as well as upper arms. Patient also has right forearm dog bite with early cellulitis and infectious sequelae. Patient has a left thigh injury with hematoma and ecchymosis and pain no evidence of advanced cellulitis or deep space infection at present time but we'll have to watch this closely. Chest wall has significant abrasion and a bite wound as well as  marked ecchymosis and bruising. Be admitted for IV Unasyn observation and other measures.  I will keep a very close eye on the patient    I discussed with the patient that hopefully the hematoma in his thigh will slowly resolve and that this won't develop into an infection however when you to keep close eye on this.  I have performed a aggressive ID of his forearm tonight and hopefully this will make [place] with change to improve parameters.  Paulene Floor, MD 11/09/2016, 12:16 AM

## 2016-11-09 NOTE — Discharge Instructions (Signed)

## 2016-11-09 NOTE — Progress Notes (Signed)
Last dose of IV antibiotic finished and pt ready for discharge. All wounds cleansed and re-dressed prior to pt leaving. Education/instructions reviewed with pt, and all questions/concerns addressed. IV removed, prescriptions given, and belongings gathered. Pt declined a wheelchair out, and will ambulate out to their vehicle. Will continue to monitor

## 2016-11-11 LAB — AEROBIC CULTURE W GRAM STAIN (SUPERFICIAL SPECIMEN): Culture: NO GROWTH

## 2016-11-11 LAB — AEROBIC CULTURE  (SUPERFICIAL SPECIMEN)

## 2016-11-19 ENCOUNTER — Ambulatory Visit (INDEPENDENT_AMBULATORY_CARE_PROVIDER_SITE_OTHER): Admitting: Neurology

## 2016-11-19 ENCOUNTER — Other Ambulatory Visit: Payer: Self-pay

## 2016-11-19 ENCOUNTER — Encounter: Payer: Self-pay | Admitting: Neurology

## 2016-11-19 VITALS — BP 150/84 | HR 54 | Ht 66.0 in | Wt 183.0 lb

## 2016-11-19 DIAGNOSIS — G4761 Periodic limb movement disorder: Secondary | ICD-10-CM | POA: Diagnosis not present

## 2016-11-19 DIAGNOSIS — G2581 Restless legs syndrome: Secondary | ICD-10-CM

## 2016-11-19 MED ORDER — ROPINIROLE HCL 0.25 MG PO TABS
ORAL_TABLET | ORAL | 3 refills | Status: DC
Start: 2016-11-19 — End: 2017-11-27

## 2016-11-19 MED ORDER — ROPINIROLE HCL 0.25 MG PO TABS: ORAL_TABLET | ORAL | 5 refills | Status: DC

## 2016-11-19 MED ORDER — ROPINIROLE HCL 0.25 MG PO TABS: ORAL_TABLET | ORAL | 3 refills | Status: DC

## 2016-11-19 NOTE — Patient Instructions (Signed)
We will try to gradually increase your requip to up to 2 pills 3 times a day.  You are on a reasonable dose requip for your restless legs and leg movements, I would like to be cautious with further increases in the future for fear of what we call augmentation, which means that you may get accelerated symptoms, affecting your upper body and sooner in the day, than just at night. Requip can cause swelling of the legs as well. We will monitor.

## 2016-11-19 NOTE — Progress Notes (Signed)
Subjective:    Patient ID: Grant [name] is a 61 y.o. male.  HPI     Interim history:   Mr. Grant [name] is a 61 year old right-handed gentleman with an underlying medical history of hypertension, reflux disease, allergies, and overweight state, who presents for follow up consultation of his sleep disorder, in particular, his PLMD and restless leg symptoms. The patient is unaccompanied today. I last saw him on 08/19/2016, at which time he reported that he had come off of temazepam and felt more restless and had more twitching in his sleep. In the past he had tried gabapentin for some other reason including possibly for back pain but he had significant side effects on it including suicidal ideations. He also reported trying Lyrica in the past, also with side effects. I suggested we proceed with some blood work including iron studies and a trial of ropinirole with gradual titration. He was advised to start with 0.25 mg strength and titrated from 1 pill to up to 4 pills. He emailed in the interim reporting that he was taking 2 pills in the late afternoon and 2 pills at night.  Today, 11/19/2016 (all dictated new, as well as above notes, some dictation done in note pad or Word, outside of chart, may appear as copied):  He reports taking the ropinirole in 3 increments, he takes one pill around midmorning, one around 5 PM and 2 at bedtime. Of note, he was recently hospitalized for multiple dog bites that got infected. He was in the hospital from 11/08/2016 through 11/09/2016. He presented to urgent care after his dog bites initially on 11/06/2016. He is still on oral antibiotics, he is recuperating. He is currently not working. His PLMS and restless leg symptoms are a little better since he switched his regimen around. He would like to try 5 pills or 6 pills per day if possible. We talked about augmentation and progression of restless leg symptoms today.  The patient's allergies, current medications, family  history, past medical history, past social history, past surgical history and problem list were reviewed and updated as appropriate.    Previously (copied from previous notes for reference):   I first met him on 07/15/2016 at the request of his primary care physician, at which time the patient reported snoring and excessive daytime somnolence, difficulty falling asleep, sleep disruption. He was invited for sleep study. He had a baseline sleep study on 07/31/2016. I went over his test results with him in detail today. Sleep efficiency was 76.7%, sleep latency 36 minutes, wake after sleep onset was 75 minutes with mild to moderate sleep fragmentation noted. He had an increased percentage of stage II sleep, slow-wave sleep at 16.9%, REM sleep was 15%. He had mild snoring. EKG showed normal sinus rhythm. Total AHI was 2.1 per hour, REM AHI was 14 per hour, supine AHI was 6.8 per hour. Average oxygen saturation was 95%, nadir was 85%. He had severe PLMS with an index of 65.6 per hour, associated with mild arousals of 9.2 per hour.    07/15/2016: He reports snoring and excessive daytime somnolence. I reviewed your office note from 06/17/2016. His Epworth sleepiness score is 16 out of 24, his fatigue score is 47 out of 63. He reports waking up with a sense of choking, difficulty falling asleep and sleep disruption. He estimates that he gets about 4-5 hours of sleep on any given day. He is married and lives with his wife. He has 4 children. He was a fairly heavy  1-2 pack per day smoker and quit in November 2017. He denies drinking alcohol or using illicit drugs. He denies utilizing caffeine on a day-to-day basis. He has been sleeping in a recliner for years, recently got a hospital twin bed and started sleeping in it, slightly elevated.  He had been on temazepam for about 3 years and recently weaned off of it.  He has had intermittent RLS symptoms. He has not been sleeping in the same bedroom with his wife due to  both snoring.  He has had anxiety issues and has been taking OTC CBD oil.  Bedtime is between 11 and 12, he often takes melatonin for sleep. She denies night to night nocturia or morning headaches or family history of OSA. His wake up time is between 5:30 and 6 AM. He works part-time as an Clinical biochemist. He has a TV in the bedroom but does not rely on it staying on at night. They have one dog but the poodle does not sleep in his bedroom. history, past surgical history and problem list were reviewed and updated as appropriate.   His Past Medical History Is Significant For: Past Medical History:  Diagnosis Date  . Acid reflux   . Allergy   . Anxiety   . Hypertension   . Insomnia     His Past Surgical History Is Significant For: Past Surgical History:  Procedure Laterality Date  . INCISION AND DRAINAGE Right 02/04/2013   Procedure: INCISION AND DRAINAGE;  Surgeon: Linna Hoff, MD;  Location: [place];  Service: Orthopedics;  Laterality: Right;  . OPEN REDUCTION INTERNAL FIXATION (ORIF) FINGER WITH RADIAL BONE GRAFT Right 02/04/2013   Procedure: OPEN REDUCTION INTERNAL FIXATION (ORIF) right ring and small fingers;  Surgeon: Linna Hoff, MD;  Location: [place];  Service: Orthopedics;  Laterality: Right;    His Family History Is Significant For: Family History  Problem Relation Age of Onset  . Hyperlipidemia Mother   . Hyperlipidemia Father     His Social History Is Significant For: Social History   Social History  . Marital status: Married    Spouse name: N/A  . Number of children: 4  . Years of education: 12   Occupational History  . [employer]     Social History Main Topics  . Smoking status: Former Research scientist (life sciences)  . Smokeless tobacco: Never Used  . Alcohol use No  . Drug use: Yes    Types: Marijuana  . Sexual activity: Not Asked   Other Topics Concern  . None   Social History Narrative   Denies caffeine use     His Allergies Are:  Allergies  Allergen Reactions  .  Asa [Aspirin] Other (See Comments)    Gi upset  . Bee Venom Swelling and Rash  :   His Current Medications Are:  Outpatient Encounter Prescriptions as of 11/19/2016  Medication Sig  . amLODipine (NORVASC) 10 MG tablet Take 1 tablet by mouth at bedtime.   Marland Kitchen amoxicillin-clavulanate (AUGMENTIN) 875-125 MG tablet Take 1 tablet by mouth 2 (two) times daily.  Marland Kitchen EPINEPHrine (EPIPEN 2-PAK) 0.3 mg/0.3 mL IJ SOAJ injection Inject 0.3 mLs (0.3 mg total) into the muscle once. For difficulty breathing or swallowing due to allergic reaction  . metoprolol succinate (TOPROL-XL) 50 MG 24 hr tablet Take 50 mg by mouth at bedtime. Take with or immediately following a meal.   . omeprazole (PRILOSEC) 40 MG capsule Take 40 mg by mouth daily.  Marland Kitchen rOPINIRole (REQUIP) 0.25 MG tablet  1 pill nightly x 1 week, then 2 pills nightly x 1 w, then 3 pills nightly x 1 w, then 4 pills nightly thereafter. 90-120 min before bedtime.   No facility-administered encounter medications on file as of 11/19/2016.   :  Review of Systems:  Out of a complete 14 point review of systems, all are reviewed and negative with the exception of these symptoms as listed below: Review of Systems  Neurological:       Pt presents today to discuss his RLS. Pt is taking requip TID: 1 tablet in mid morning, 1 tablet at 5pm, 2 tablets at bedtime. Pt noticed that his legs starting moving more around 5-6pm and adjusted his requip timing because of this. Pt is recovering from a dog attack.    Objective:  Neurological Exam  Physical Exam Physical Examination:   Vitals:   11/19/16 0937  BP: (!) 150/84  Pulse: (!) 54   General Examination: The patient is a very pleasant 61 y.o. male in no acute distress. He appears well-developed and well-nourished and well groomed.   HEENT:Normocephalic, atraumatic, pupils are equal, round and reactive to light and accommodation. Funduscopic exam is normal with sharp disc margins noted. Extraocular tracking is  good without limitation to gaze excursion or nystagmus noted. Normal smooth pursuit is noted. Hearing is grossly intact. Face is symmetric with normal facial animation and normal facial sensation. Speech is clear with no dysarthria noted. There is no hypophonia. There is no lip, neck/head, jaw or voice tremor. Oropharynx exam reveals: mildmouth dryness, marginal dental hygiene with near edentulous state,and mildairway crowding. Mallampati is class I. Tongue protrudes centrally and palate elevates symmetrically.   Chest:Clear to auscultation without wheezing, rhonchi or crackles noted.  Heart:S1+S2+0, regular and normal without murmurs, rubs or gallops noted.   Abdomen:Soft, non-tender and non-distended with normal bowel sounds appreciated on auscultation.  Extremities:There is nopitting edema in the distal lower extremities bilaterally. Pedal pulses are intact.  Skin: Warm and dry without trophic changes noted. There are no varicose veins.   Musculoskeletal: exam reveals no obvious joint deformities, tenderness or joint swelling or erythema. Multiple bandages, R arm, L thigh.   Neurologically:  Mental status: The patient is awake, alert and oriented in all 4 spheres. Hisimmediate and remote memory, attention, language skills and fund of knowledge are appropriate. There is no evidence of aphasia, agnosia, apraxia or anomia. Speech is clear with normal prosody and enunciation. Thought process is linear. Mood is normaland affect is normal.  Cranial nerves II - XII are as described above under HEENT exam. Motor exam: Normal bulk, strength and tone is noted. There is no drift, tremor or rebound. Reflexes are 1+ throughout. Fine motor skills and coordination: intact with normal finger taps, normal hand movements, normal rapid alternating patting, normal foot taps and normal foot agility.  Cerebellar testing: No dysmetria or intention tremor.  Sensory exam: intact to light touch in the  upper and lower extremities.  Gait, station and balance: Hestands easily. No veering to one side is noted. No leaning to one side is noted. Posture is age-appropriate and stance is narrow based. Gait shows normalstride length and normalpace. No problems turning are noted.   Assessment and Plan:  In summary, JIE STICKELS a very pleasant 61 year old malewith an underlying medical history of hypertension, reflux disease, allergies, and overweight state, who presents for follow up consultation of his PLMS and was leg syndrome. He had no significant sleep disordered breathing during his baseline sleep  study from May 2018. He has started a trial of Requip with gradual titration from 0.25 mg strength once in the evening to currently a total of 1 mg daily. We can increase with caution to where he can take 0.25 mg strength 2 pills up to 3 times a day. He is advised regarding progression of restless leg syndrome and also augmentation which can be seen with dopamine agonist type medications. We are still in a reasonable dose range for augmentation should not be a major player but I would be cautious with further increases down the road. He had a difficult time coming off of temazepam in the past but he has been off of it. He is still recuperating from recent severe dog bites and infections. We had done blood work in June 2018 which showed no significant iron deficiency. I suggested a six-month recheck, sooner as needed. He is encouraged to be in touch with [place] via phone or email in the interim. I sent his prescription for 90 days to his [facility] as requested. I answered all his questions today and he was in agreement. I spent 25 minutes in total face-to-face time with the patient, more than 50% of which was spent in counseling and coordination of care, reviewing test results, reviewing medication and discussing or reviewing the diagnosis of PLMD, RLS, the prognosis and treatment options.  Pertinent laboratory and imaging test results that were available during this visit with the patient were reviewed by me and considered in my medical decision making (see chart for details).

## 2016-12-03 ENCOUNTER — Telehealth: Payer: Self-pay | Admitting: Neurology

## 2016-12-03 MED ORDER — ROPINIROLE HCL 0.25 MG PO TABS: ORAL_TABLET | ORAL | 0 refills | Status: DC

## 2016-12-03 NOTE — Telephone Encounter (Addendum)
I spoke with Dr. Rexene Alberts, she is agreeable to a 30 day supply of requip going to [place] until mail order arrives, no refills.   I called pt and advised him that this order was sent to [facility]. Pt verbalized understanding and appreciation.

## 2016-12-03 NOTE — Telephone Encounter (Signed)
Patient has not received order for rOPINIRole (REQUIP) 0.25 MG tablet from [facility] yet. Can a 30 day supply be sent to [name] in [place] which will be enough until she gets from mail order?

## 2016-12-03 NOTE — Addendum Note (Signed)
Addended by: Lester [place] A on: 12/03/2016 01:34 PM   Modules accepted: Orders

## 2017-04-03 DIAGNOSIS — M2142 Flat foot [pes planus] (acquired), left foot: Secondary | ICD-10-CM | POA: Insufficient documentation

## 2017-04-03 DIAGNOSIS — M2141 Flat foot [pes planus] (acquired), right foot: Secondary | ICD-10-CM | POA: Insufficient documentation

## 2017-05-05 ENCOUNTER — Other Ambulatory Visit: Payer: Self-pay | Admitting: Family Medicine

## 2017-05-19 ENCOUNTER — Ambulatory Visit: Admitting: Neurology

## 2017-06-23 ENCOUNTER — Other Ambulatory Visit: Payer: Self-pay | Admitting: Family Medicine

## 2017-07-11 ENCOUNTER — Encounter: Payer: Self-pay | Admitting: Family Medicine

## 2017-07-11 MED ORDER — EPINEPHRINE 0.3 MG/0.3ML IJ SOAJ: 0.3000 mg | Freq: Once | INTRAMUSCULAR | 3 refills | Status: DC

## 2017-07-14 MED ORDER — EPINEPHRINE 0.3 MG/0.3ML IJ SOAJ: 0.3000 mg | Freq: Once | INTRAMUSCULAR | 3 refills | Status: DC

## 2017-07-15 ENCOUNTER — Ambulatory Visit (INDEPENDENT_AMBULATORY_CARE_PROVIDER_SITE_OTHER): Admitting: Neurology

## 2017-07-15 ENCOUNTER — Encounter: Payer: Self-pay | Admitting: Neurology

## 2017-07-15 ENCOUNTER — Encounter

## 2017-07-15 VITALS — BP 132/69 | HR 67 | Ht 69.0 in | Wt 183.0 lb

## 2017-07-15 DIAGNOSIS — G4761 Periodic limb movement disorder: Secondary | ICD-10-CM | POA: Diagnosis not present

## 2017-07-15 DIAGNOSIS — G2581 Restless legs syndrome: Secondary | ICD-10-CM

## 2017-07-15 NOTE — Patient Instructions (Addendum)
Let's change your ropinirole to 3 pills at 4:30 PM and 3 pills at 8:30 PM.  We will see you in about 3 to 4 months with one of our nurse practitioners, at which time time we can consider increasing the night time dose of requip or add gabapentin at night as an adjunct.

## 2017-07-15 NOTE — Progress Notes (Signed)
Subjective:    Patient ID: Grant [name] is a 62 y.o. male.  HPI     Interim history:  Grant [name] is a 62 year old right-handed gentleman with an underlying medical history of hypertension, reflux disease, allergies, and overweight state, who presents for follow up consultation of his restless legs and PLMS. The patient is unaccompanied today. I last saw him on 11/19/2016, at which time he was taking ropinirole 0.25 mg strength at 3 different times. He had recently suffered serious dog bites that became infected and needed treatment for this as an inpatient. I suggested cautious increase in his Requip 0.25 mg strength 2 pills 3 times a day. He was advised regarding augmentation.   Today, 07/15/2017 (all dictated new, as well as above notes, some dictation done in note pad or Word, outside of chart, may appear as copied):  He reports feeling about the same, maybe a little better. He takes requip 1 pill at 6 AM (2 pills was too much in AM), 1 to 2 at 4:30 and 2 at BT, which ranges from 10 PM to MN. Sometimes he has symptoms in his arms. Sx of RLS date back to several years ago. He may have tried gabapentin in the past for Back pain, not sure if he tolerated it. He would be willing to retry it.   The patient's allergies, current medications, family history, past medical history, past social history, past surgical history and problem list were reviewed and updated as appropriate.    Previously (copied from previous notes for reference):    I first met him on 07/15/2016 at the request of his primary care physician, at which time the patient reported snoring and excessive daytime somnolence, difficulty falling asleep, sleep disruption. He was invited for sleep study. He had a baseline sleep study on 07/31/2016. I went over his test results with him in detail today. Sleep efficiency was 76.7%, sleep latency 36 minutes, wake after sleep onset was 75 minutes with mild to moderate sleep fragmentation noted.  He had an increased percentage of stage II sleep, slow-wave sleep at 16.9%, REM sleep was 15%. He had mild snoring. EKG showed normal sinus rhythm. Total AHI was 2.1 per hour, REM AHI was 14 per hour, supine AHI was 6.8 per hour. Average oxygen saturation was 95%, nadir was 85%. He had severe PLMS with an index of 65.6 per hour, associated with mild arousals of 9.2 per hour.   07/15/2016: He reports snoring and excessive daytime somnolence. I reviewed your office note from 06/17/2016. His Epworth sleepiness score is 16 out of 24, his fatigue score is 47 out of 63. He reports waking up with a sense of choking, difficulty falling asleep and sleep disruption. He estimates that he gets about 4-5 hours of sleep on any given day. He is married and lives with his wife. He has 4 children. He was a fairly heavy 1-2 pack per day smoker and quit in November 2017. He denies drinking alcohol or using illicit drugs. He denies utilizing caffeine on a day-to-day basis. He has been sleeping in a recliner for years, recently got a hospital twin bed and started sleeping in it, slightly elevated.  He had been on temazepam for about 3 years and recently weaned off of it.  He has had intermittent RLS symptoms. He has not been sleeping in the same bedroom with his wife due to both snoring.  He has had anxiety issues and has been taking OTC CBD oil.  Bedtime is  between 11 and 12, he often takes melatonin for sleep. She denies night to night nocturia or morning headaches or family history of OSA. His wake up time is between 5:30 and 6 AM. He works part-time as an Clinical biochemist. He has a TV in the bedroom but does not rely on it staying on at night. They have one dog but the poodle does not sleep in his bedroom. history, past surgical history and problem list were reviewed and updated as appropriate.   His Past Medical History Is Significant For: Past Medical History:  Diagnosis Date  . Acid reflux   . Allergy   . Anxiety   .  Hypertension   . Insomnia     His Past Surgical History Is Significant For: Past Surgical History:  Procedure Laterality Date  . INCISION AND DRAINAGE Right 02/04/2013   Procedure: INCISION AND DRAINAGE;  Surgeon: Linna Hoff, MD;  Location: [facility];  Service: Orthopedics;  Laterality: Right;  . OPEN REDUCTION INTERNAL FIXATION (ORIF) FINGER WITH RADIAL BONE GRAFT Right 02/04/2013   Procedure: OPEN REDUCTION INTERNAL FIXATION (ORIF) right ring and small fingers;  Surgeon: Linna Hoff, MD;  Location: [facility];  Service: Orthopedics;  Laterality: Right;    His Family History Is Significant For: Family History  Problem Relation Age of Onset  . Hyperlipidemia Mother   . Hyperlipidemia Father     His Social History Is Significant For: Social History   Socioeconomic History  . Marital status: Married    Spouse name: Not on file  . Number of children: 4  . Years of education: 32  . Highest education level: Not on file  Occupational History  . Occupation: Engineer, petroleum  . Financial resource strain: Not on file  . Food insecurity:    Worry: Not on file    Inability: Not on file  . Transportation needs:    Medical: Not on file    Non-medical: Not on file  Tobacco Use  . Smoking status: Former Research scientist (life sciences)  . Smokeless tobacco: Never Used  Substance and Sexual Activity  . Alcohol use: No  . Drug use: Yes    Types: Marijuana  . Sexual activity: Not on file  Lifestyle  . Physical activity:    Days per week: Not on file    Minutes per session: Not on file  . Stress: Not on file  Relationships  . Social connections:    Talks on phone: Not on file    Gets together: Not on file    Attends religious service: Not on file    Active member of club or organization: Not on file    Attends meetings of clubs or organizations: Not on file    Relationship status: Not on file  Other Topics Concern  . Not on file  Social History Narrative   Denies caffeine use     His  Allergies Are:  Allergies  Allergen Reactions  . Asa [Aspirin] Other (See Comments)    Gi upset  . Bee Venom Swelling and Rash  :   His Current Medications Are:  Outpatient Encounter Medications as of 07/15/2017  Medication Sig  . amLODipine (NORVASC) 10 MG tablet TAKE 1 TABLET DAILY  . metoprolol succinate (TOPROL-XL) 50 MG 24 hr tablet Take 1 tablet (50 mg total) by mouth daily. Requires office visit before any further refills can be given.  Marland Kitchen omeprazole (PRILOSEC) 40 MG capsule Take 40 mg by mouth daily.  Marland Kitchen omeprazole (  PRILOSEC) 40 MG capsule TAKE 1 CAPSULE DAILY  . rOPINIRole (REQUIP) 0.25 MG tablet 2 pills up to 3 times a day.  Marland Kitchen EPINEPHrine (EPIPEN 2-PAK) 0.3 mg/0.3 mL IJ SOAJ injection Inject 0.3 mLs (0.3 mg total) into the muscle once for 1 dose. For difficulty breathing or swallowing due to allergic reaction  . [DISCONTINUED] amoxicillin-clavulanate (AUGMENTIN) 875-125 MG tablet Take 1 tablet by mouth 2 (two) times daily.  . [DISCONTINUED] rOPINIRole (REQUIP) 0.25 MG tablet 2 pills up to 3 times a day.   No facility-administered encounter medications on file as of 07/15/2017.   :  Review of Systems:  Out of a complete 14 point review of systems, all are reviewed and negative with the exception of these symptoms as listed below: Review of Systems  Neurological:       Pt presents today to discuss his RLS. Pt takes ropinirole 1-2 tablets in the morning, 1 tablet about 4:30pm, and 2 tablets at bedtime. Pt still notices his legs twitching at night. Pt sometimes feels the twitching in his arms and is wondering if this is related to the ropinirole.    Objective:  Neurological Exam  Physical Exam Physical Examination:   Vitals:   07/15/17 1424  BP: 132/69  Pulse: 67    General Examination: The patient is a very pleasant 62 y.o. male in no acute distress. He appears well-developed and well-nourished and well groomed.   HEENT:Normocephalic, atraumatic, pupils are equal, round  and reactive to light and accommodation. Corrective eyeglasses in place. Extraocular tracking is good without limitation to gaze excursion or nystagmus noted. Normal smooth pursuit is noted. Hearing is grossly intact. Face is symmetric with normal facial animation and normal facial sensation. Speech is clear with no dysarthria noted. There is no hypophonia. There is no lip, neck/head, jaw or voice tremor. Oropharynx exam reveals: mildmouth dryness, near edentulous state,and mildairway crowding. Mallampati is class I. Tongue protrudes centrally and palate elevates symmetrically.   Chest:Clear to auscultation without wheezing, rhonchi or crackles noted.  Heart:S1+S2+0, regular and normal without murmurs, rubs or gallops noted.   Abdomen:Soft, non-tender and non-distended with normal bowel sounds appreciated on auscultation.  Extremities:There is trace edema in the distal lower extremities bilaterally.   Skin: Warm and dry without trophic changes noted. There are no varicose veins.   Musculoskeletal: exam reveals no obvious joint deformities, tenderness or joint swelling or erythema.   Neurologically:  Mental status: The patient is awake, alert and oriented in all 4 spheres. Hisimmediate and remote memory, attention, language skills and fund of knowledge are appropriate. There is no evidence of aphasia, agnosia, apraxia or anomia. Speech is clear with normal prosody and enunciation. Thought process is linear. Mood is normaland affect is normal.  Cranial nerves II - XII are as described above under HEENT exam. Motor exam: Normal bulk, strength and tone is noted. There is no tremor. Fine motor skills and coordination: grossly intact.  Cerebellar testing: No dysmetria or intention tremor.  Sensory exam: intact to light touch in the upper and lower extremities.  Gait, station and balance: Hestands easily. No veering to one side is noted. No leaning to one side is noted. Posture is  age-appropriate and stance is narrow based. Gait shows normalstride length and normalpace. No problems turning are noted.   Assessment and Plan:  In summary, Grant [name] a very pleasant 62 year old malewith an underlying medical history of hypertension, reflux disease, allergies, and overweight state, who presents for follow up consultation of  his PLMS and RLS. He had no significant sleep disordered breathing during his baseline sleep study from May 2018. He started Requip with gradual titration from 0.25 mg strength once in the evening to currently a total of 1.25 mg daily total dose, divided typically in 3 doses. He is advised to avoid taking a daytime or morning dose because he has had some sedation from it. I suggested he take 3 pills which is 0.75 mg total dose at 4:30 PM and 3 pills at 8:30 PM with a bedtime typically around 10 PM or later. He is advised to try this for the next couple of months. He is advised to follow-up routinely with one of our nurse practitioners in about 3-4 months and call us in the interim should he have any questions or concerns. Our next option would be to increase the nighttime dose of Requip cautiously or to add at adjunct dose of low-dose gabapentin. He reports that he is not sure if he tried it before, he does report that he tried several things to help him sleep in the past but does not actually recall for sure if he tried gabapentin. He would be willing to try it. He will try to see if any old records tell him more about previous medications tried and failed for sleep. He will call us with any additional information. He did not need a refill on his ropinirole at this time. I answered all his questions today and he was in agreement. I spent 25 minutes in total face-to-face time with the patient, more than 50% of which was spent in counseling and coordination of care, reviewing test results, reviewing medication and discussing or reviewing the diagnosis of PLMD,  RLS, the prognosis and treatment options. Pertinent laboratory and imaging test results that were available during this visit with the patient were reviewed by me and considered in my medical decision making (see chart for details).

## 2017-08-03 ENCOUNTER — Other Ambulatory Visit: Payer: Self-pay | Admitting: Family Medicine

## 2017-09-07 ENCOUNTER — Encounter: Payer: Self-pay | Admitting: Family Medicine

## 2017-09-07 ENCOUNTER — Encounter: Payer: Self-pay | Admitting: Neurology

## 2017-09-15 ENCOUNTER — Ambulatory Visit (INDEPENDENT_AMBULATORY_CARE_PROVIDER_SITE_OTHER): Admitting: Family Medicine

## 2017-09-15 ENCOUNTER — Encounter: Payer: Self-pay | Admitting: Family Medicine

## 2017-09-15 VITALS — BP 110/76 | HR 60 | Temp 98.0°F | Resp 14 | Ht 69.0 in | Wt 177.0 lb

## 2017-09-15 DIAGNOSIS — Z125 Encounter for screening for malignant neoplasm of prostate: Secondary | ICD-10-CM | POA: Diagnosis not present

## 2017-09-15 DIAGNOSIS — I10 Essential (primary) hypertension: Secondary | ICD-10-CM | POA: Diagnosis not present

## 2017-09-15 DIAGNOSIS — G2581 Restless legs syndrome: Secondary | ICD-10-CM | POA: Diagnosis not present

## 2017-09-15 LAB — LIPID PANEL
CHOL/HDL RATIO: 5.1 (calc) — AB (ref <5.0)
CHOLESTEROL: 162 mg/dL (ref <200)
HDL: 32 mg/dL — ABNORMAL LOW (ref >40)
LDL Cholesterol (Calc): 102 mg/dL (calc) — ABNORMAL HIGH
Non-HDL Cholesterol (Calc): 130 mg/dL (calc) — ABNORMAL HIGH (ref <130)
Triglycerides: 168 mg/dL — ABNORMAL HIGH (ref <150)

## 2017-09-15 LAB — CBC WITH DIFFERENTIAL/PLATELET
Basophils Absolute: 54 cells/uL (ref 0–200)
Basophils Relative: 0.8 %
Eosinophils Absolute: 174 cells/uL (ref 15–500)
Eosinophils Relative: 2.6 %
HCT: 40 % (ref 38.5–50.0)
Hemoglobin: 13.5 g/dL (ref 13.2–17.1)
Lymphs Abs: 1608 cells/uL (ref 850–3900)
MCH: 29.5 pg (ref 27.0–33.0)
MCHC: 33.8 g/dL (ref 32.0–36.0)
MCV: 87.3 fL (ref 80.0–100.0)
MONOS PCT: 8.4 %
MPV: 9.8 fL (ref 7.5–12.5)
Neutro Abs: 4301 cells/uL (ref 1500–7800)
Neutrophils Relative %: 64.2 %
PLATELETS: 278 10*3/uL (ref 140–400)
RBC: 4.58 10*6/uL (ref 4.20–5.80)
RDW: 12.3 % (ref 11.0–15.0)
TOTAL LYMPHOCYTE: 24 %
WBC: 6.7 10*3/uL (ref 3.8–10.8)
WBCMIX: 563 {cells}/uL (ref 200–950)

## 2017-09-15 LAB — COMPLETE METABOLIC PANEL WITH GFR
AG Ratio: 2 (calc) (ref 1.0–2.5)
ALT: 12 U/L (ref 9–46)
AST: 15 U/L (ref 10–35)
Albumin: 4.4 g/dL (ref 3.6–5.1)
Alkaline phosphatase (APISO): 106 U/L (ref 40–115)
BUN: 15 mg/dL (ref 7–25)
CALCIUM: 9.3 mg/dL (ref 8.6–10.3)
CO2: 27 mmol/L (ref 20–32)
CREATININE: 1.02 mg/dL (ref 0.70–1.25)
Chloride: 105 mmol/L (ref 98–110)
GFR, EST AFRICAN AMERICAN: 90 mL/min/{1.73_m2} (ref >=60)
GFR, Est Non African American: 78 mL/min/{1.73_m2} (ref >=60)
GLUCOSE: 80 mg/dL (ref 65–99)
Globulin: 2.2 g/dL (calc) (ref 1.9–3.7)
Potassium: 4 mmol/L (ref 3.5–5.3)
Sodium: 141 mmol/L (ref 135–146)
Total Bilirubin: 0.5 mg/dL (ref 0.2–1.2)
Total Protein: 6.6 g/dL (ref 6.1–8.1)

## 2017-09-15 LAB — PSA: PSA: 1.4 ng/mL (ref <=4.0)

## 2017-09-15 MED ORDER — METOPROLOL SUCCINATE ER 50 MG PO TB24: ORAL_TABLET | ORAL | 4 refills | Status: DC

## 2017-09-15 MED ORDER — EPINEPHRINE 0.3 MG/0.3ML IJ SOAJ: 0.3000 mg | Freq: Once | INTRAMUSCULAR | 3 refills | Status: DC

## 2017-09-15 MED ORDER — AMLODIPINE BESYLATE 10 MG PO TABS
10.0000 mg | ORAL_TABLET | Freq: Every day | ORAL | 4 refills | Status: DC
Start: 2017-09-15 — End: 2017-12-30

## 2017-09-15 MED ORDER — PANTOPRAZOLE SODIUM 40 MG PO TBEC: 40.0000 mg | DELAYED_RELEASE_TABLET | Freq: Every day | ORAL | 3 refills | Status: DC

## 2017-09-15 NOTE — Progress Notes (Signed)
Subjective:    Patient ID: Grant [name], male    DOB: 10/20/1955, 62 y.o.   MRN: [id number]  HPI  06/2016 Here today to establish care.  Patient takes metoprolol due to a history of tachycardia and racing heart rate in the past. On this medication this is prevented. He tried some of this to anxiety. He believes some of his symptoms are due to palpitations secondary to anxiety. He is also on amlodipine for hypertension. His blood pressure here today is elevated. He states his blood pressure at home however is much better and that he is nervous and doctor's office. He also takes Nexium every day for acid reflux. If he stops the medication, within 2 or 3 days the symptoms come back just as bad as before. He denies any blood in his stool. He's had an EGD that was normal. He is also on temazepam for insomnia. He's been taking this medication for years. He would like to try to get off this medication because he knows it is habit forming. He realizes that he is chemically dependent to it and he would like to try to wean off. He is interested in non-addictive options. He is overdue for colonoscopy as well as for prostate cancer screening. He refuses the shingles vaccine today as well as the pneumonia vaccine given his smoking history. He is due for hepatitis C screening. He also reports hypersomnia. He follows asleep very easily just sitting up in a chair. Patient states that he sleeps in a chair every night because of he lays flat on his back he will stop breathing. Frequently throughout the night he will wake up gasping for air and less he sitting up to sleep. He also complains of fatigue and hypersomnolence. He denies any headache.  At that time, my plan was: I will obtain baseline lab work including a CBC, CMP, fasting lipid panel, and a PSA. The patient's blood pressure today is elevated. He will check his blood pressure and his heart rate daily for the next week and notify me of the values. I'll screen the  patient for prostate cancer with a PSA. He defers a colonoscopy at the present time. He will allow me to screen him for hepatitis C. He would also like to proceed with a referral to a sleep specialist for sleep study. He too is concerned that he has sleep apnea. He is afraid that this may be causing some of his fatigue as well as exacerbating his blood pressures. Therefore I will schedule the patient for a split-level sleep study. Recheck blood pressures in 2 weeks  09/15/17 Patient is here today for follow-up of his chronic medical problems.  Since I last saw the patient, he had a sleep study that did not reveal sleep apnea but did show periodic limb movements of sleep along with restless leg syndrome.  He is currently taking Requip under the care of neurologist although he continues to have breakthrough symptoms.  They recently increased his dose but he is yet to see benefit.  His blood pressure today is excellent.  Is actually a little on the low side.  He does complain of fatigue during the day and also swelling in his legs.  He also complains of an aching discomfort in his legs however this improves with movement making me believe is most likely restless leg syndrome.  He has normal popliteal pulses bilaterally.  He has normal dorsalis pedis and posterior tibialis pulses bilaterally.  He is due for  prostate cancer screening.  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  Apparently does not appear to be working as well.  Even despite taking it with food he still has breakthrough symptoms.  He would like to switch to a different PPI Past Medical History:  Diagnosis Date  . Acid reflux   . Allergy   . Anxiety   . Hypertension   . Insomnia    Past Surgical History:  Procedure Laterality Date  . INCISION AND DRAINAGE Right 02/04/2013   Procedure: INCISION AND DRAINAGE;  Surgeon: Linna Hoff, MD;  Location: [facility];  Service: Orthopedics;   Laterality: Right;  . OPEN REDUCTION INTERNAL FIXATION (ORIF) FINGER WITH RADIAL BONE GRAFT Right 02/04/2013   Procedure: OPEN REDUCTION INTERNAL FIXATION (ORIF) right ring and small fingers;  Surgeon: Linna Hoff, MD;  Location: [facility];  Service: Orthopedics;  Laterality: Right;   Current Outpatient Medications on File Prior to Visit  Medication Sig Dispense Refill  . omeprazole (PRILOSEC) 40 MG capsule TAKE 1 CAPSULE DAILY 90 capsule 0  . rOPINIRole (REQUIP) 0.25 MG tablet 2 pills up to 3 times a day. (Patient taking differently: 4 pills up to 2 times a day.) 540 tablet 3   No current facility-administered medications on file prior to visit.    Allergies  Allergen Reactions  . Asa [Aspirin] Other (See Comments)    Gi upset  . Bee Venom Swelling and Rash   Social History   Socioeconomic History  . Marital status: Married    Spouse name: Not on file  . Number of children: 4  . Years of education: 71  . Highest education level: Not on file  Occupational History  . Occupation: Engineer, petroleum  . Financial resource strain: Not on file  . Food insecurity:    Worry: Not on file    Inability: Not on file  . Transportation needs:    Medical: Not on file    Non-medical: Not on file  Tobacco Use  . Smoking status: Former Research scientist (life sciences)  . Smokeless tobacco: Never Used  Substance and Sexual Activity  . Alcohol use: No  . Drug use: Yes    Types: Marijuana  . Sexual activity: Not on file  Lifestyle  . Physical activity:    Days per week: Not on file    Minutes per session: Not on file  . Stress: Not on file  Relationships  . Social connections:    Talks on phone: Not on file    Gets together: Not on file    Attends religious service: Not on file    Active member of club or organization: Not on file    Attends meetings of clubs or organizations: Not on file    Relationship status: Not on file  . Intimate partner violence:    Fear of current or ex partner: Not on  file    Emotionally abused: Not on file    Physically abused: Not on file    Forced sexual activity: Not on file  Other Topics Concern  . Not on file  Social History Narrative   Denies caffeine use    Family History  Problem Relation Age of Onset  . Hyperlipidemia Mother   . Hyperlipidemia Father        Review of Systems  All other systems reviewed and are negative.      Objective:   Physical Exam  Constitutional: He is oriented to person, place, and time. He appears well-developed and well-nourished. No distress.  HENT:  Head: Normocephalic and atraumatic.  Right Ear: External ear normal.  Left Ear: External ear normal.  Nose: Nose normal.  Mouth/Throat: Oropharynx is clear and moist. No oropharyngeal exudate.  Eyes: Pupils are equal, round, and reactive to light. Conjunctivae and EOM are normal. Right eye exhibits no discharge. Left eye exhibits no discharge. No scleral icterus.  Neck: Normal range of motion. Neck supple. No JVD present. No tracheal deviation present. No thyromegaly present.  Cardiovascular: Normal rate, regular rhythm, normal heart sounds and intact distal pulses. Exam reveals no gallop and no friction rub.  No murmur heard. Pulmonary/Chest: Effort normal and breath sounds normal. No stridor. No respiratory distress. He has no wheezes. He has no rales. He exhibits no tenderness.  Abdominal: Soft. Bowel sounds are normal. He exhibits no distension and no mass. There is no tenderness. There is no rebound and no guarding.  Musculoskeletal: Normal range of motion. He exhibits no edema or tenderness.  Lymphadenopathy:    He has no cervical adenopathy.  Neurological: He is alert and oriented to person, place, and time. He has normal reflexes. No cranial nerve deficit. He exhibits normal muscle tone. Coordination normal.  Skin: Skin is warm. No rash noted. He is not diaphoretic. No erythema. No pallor.  Psychiatric: He has a normal mood and affect. His behavior  is normal. Judgment and thought content normal.  Vitals reviewed.         Assessment & Plan:  Benign essential HTN - Plan: CBC with Differential/Platelet, COMPLETE METABOLIC PANEL WITH GFR, Lipid panel  Prostate cancer screening - Plan: PSA  RLS (restless legs syndrome)  Blood pressure today is well controlled.  I have recommended temporarily discontinuing amlodipine and monitoring his blood pressure more closely.  If less than 140/90, we will discontinue amlodipine altogether permanently.  He will continue metoprolol as he uses medication to help prevent and manage palpitations and PVCs.  I will check a CMP and a fasting lipid panel.  I will also check a CBC as recently he was found to have some mild anemia.  He has had borderline low iron in the past and if his anemia is worsening, he may benefit from iron supplementation also to treat his restless leg syndrome.  I will screen the patient for prostate cancer with a PSA.  I offer the patient a referral for a colonoscopy but he pleasantly declined

## 2017-09-16 ENCOUNTER — Encounter: Payer: Self-pay | Admitting: Family Medicine

## 2017-10-03 ENCOUNTER — Encounter: Payer: Self-pay | Admitting: Neurology

## 2017-10-03 ENCOUNTER — Encounter: Payer: Self-pay | Admitting: Family Medicine

## 2017-10-06 ENCOUNTER — Other Ambulatory Visit: Payer: Self-pay | Admitting: Family Medicine

## 2017-10-06 ENCOUNTER — Telehealth: Payer: Self-pay | Admitting: Neurology

## 2017-10-06 MED ORDER — EPINEPHRINE 0.3 MG/0.3ML IJ SOAJ: 0.3000 mg | Freq: Once | INTRAMUSCULAR | 3 refills | Status: DC

## 2017-10-06 MED ORDER — GABAPENTIN 100 MG PO CAPS: ORAL_CAPSULE | ORAL | 3 refills | Status: DC

## 2017-10-06 NOTE — Telephone Encounter (Signed)
Responded via mychart

## 2017-10-06 NOTE — Telephone Encounter (Signed)
As per email interaction, I suggested we add gabapentin 100 mg strength, 1 pill at bedtime for 3 days and then 1 pill twice daily for 3 days, up to 1 pill 3 times a day thereafter. [facility] did [facility], please ask patient if he would like to get a prescription sent to a retail pharmacy instead.

## 2017-10-23 ENCOUNTER — Ambulatory Visit: Admitting: Nurse Practitioner

## 2017-11-15 ENCOUNTER — Other Ambulatory Visit: Payer: Self-pay | Admitting: Family Medicine

## 2017-11-17 MED ORDER — EPINEPHRINE 0.3 MG/0.3ML IJ SOAJ: 0.3000 mg | Freq: Once | INTRAMUSCULAR | 3 refills | Status: DC

## 2017-11-17 NOTE — Telephone Encounter (Signed)
Medication called/sent to requested pharmacy  

## 2017-11-24 ENCOUNTER — Encounter: Payer: Self-pay | Admitting: Family Medicine

## 2017-11-26 NOTE — Progress Notes (Addendum)
[facility]  PATIENT: Grant [name] DOB: Jul 12, 1955   REASON FOR VISIT: Follow-up for restless legs HISTORY FROM:patient    HISTORY OF PRESENT ILLNESS: Grant [name] is a 62 year old right-handed gentleman with an underlying medical history of hypertension, reflux disease, allergies, and overweight state, who presents for follow up consultation of his restless legs and PLMS. The patient is unaccompanied today. I last saw him on 11/19/2016, at which time he was taking ropinirole 0.25 mg strength at 3 different times. He had recently suffered serious dog bites that became infected and needed treatment for this as an inpatient. I suggested cautious increase in his Requip 0.25 mg strength 2 pills 3 times a day. He was advised regarding augmentation.   Today, 07/15/2017 (all dictated new, as well as above notes, some dictation done in note pad or Word, outside of chart, may appear as copied): He reports feeling about the same, maybe a little better. He takes requip 1 pill at 6 AM (2 pills was too much in AM), 1 to 2 at 4:30 and 2 at BT, which ranges from 10 PM to MN. Sometimes he has symptoms in his arms. Sx of RLS date back to several years ago. He may have tried gabapentin in the past for Back pain, not sure if he tolerated it. He would be willing to retry it.  UPDATE 9/19/2019CM  Grant [name], 62 year old male returns for follow-up with history of restless leg syndrome.  He claims that his restless legs are a little bit worse.  He thinks his Toprol is making his restless legs worse so he has stopped the medication and wants [place] to make a suggestion however in reviewing side effects of Toprol restless legs is not a side effect.  His blood pressure is elevated in the office today at 156/61.  He was encouraged to follow-up with his primary care regarding his blood pressure.  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  His legs are more bothersome today because he had to climb a  ladder.  He was started back on gabapentin after his last visit in May with Dr. Rexene Alberts, however patient states once he got to 300 mg he had suicidal idealizations and he cut it back to 100 mg.  He has not had further suicidal idealzations on that dose.  He currently takes Requip 0.25mg  3 tablets twice a day.  He has a history of anemia in the past but his most recent CBC with hemoglobin of 13.5.  He returns for reevaluation REVIEW OF SYSTEMS: Full 14 system review of systems performed and notable only for those listed, all others are neg:  Constitutional: neg  Cardiovascular: neg Ear/Nose/Throat: neg  Skin: neg Eyes: neg Respiratory: neg Gastroitestinal: neg  Hematology/Lymphatic: neg  Endocrine: neg Musculoskeletal:neg Allergy/Immunology: neg Neurological: neg Psychiatric: neg Sleep : Restless leg syndrome   ALLERGIES: Allergies  Allergen Reactions  . Asa [Aspirin] Other (See Comments)    Gi upset  . Bee Venom Swelling and Rash    HOME MEDICATIONS: Outpatient Medications Prior to Visit  Medication Sig Dispense Refill  . gabapentin (NEURONTIN) 100 MG capsule 1 pill each night for 3 days, may increase to 2 times a day for 3 days, and up to 1 pill 3 times a day 90 capsule 3  . pantoprazole (PROTONIX) 40 MG tablet Take 1 tablet (40 mg total) by mouth daily. 90 tablet 3  . rOPINIRole (REQUIP) 0.25 MG tablet 2 pills up to 3 times a day. (Patient  taking differently: 4 pills up to 2 times a day.) 540 tablet 3  . amLODipine (NORVASC) 10 MG tablet Take 1 tablet (10 mg total) by mouth daily. (Patient not taking: Reported on 11/27/2017) 90 tablet 4  . EPINEPHrine 0.3 mg/0.3 mL IJ SOAJ injection Inject 0.3 mLs (0.3 mg total) into the muscle once for 1 dose. 2 Device 3  . metoprolol succinate (TOPROL XL) 50 MG 24 hr tablet TAKE 1 TABLET DAILY (REQUIRES OFFICE VISIT BEFORE ANY FURTHER REFILLS CAN BE GIVEN) (Patient not taking: Reported on 11/27/2017) 90 tablet 4  . EPINEPHrine 0.3 mg/0.3 mL IJ SOAJ  injection Inject 0.3 mLs (0.3 mg total) into the muscle once for 1 dose. 2 Device 3   No facility-administered medications prior to visit.     PAST MEDICAL HISTORY: Past Medical History:  Diagnosis Date  . Acid reflux   . Allergy   . Anxiety   . Hypertension   . Insomnia     PAST SURGICAL HISTORY: Past Surgical History:  Procedure Laterality Date  . INCISION AND DRAINAGE Right 02/04/2013   Procedure: INCISION AND DRAINAGE;  Surgeon: Linna Hoff, MD;  Location: [place];  Service: Orthopedics;  Laterality: Right;  . OPEN REDUCTION INTERNAL FIXATION (ORIF) FINGER WITH RADIAL BONE GRAFT Right 02/04/2013   Procedure: OPEN REDUCTION INTERNAL FIXATION (ORIF) right ring and small fingers;  Surgeon: Linna Hoff, MD;  Location: [place];  Service: Orthopedics;  Laterality: Right;    FAMILY HISTORY: Family History  Problem Relation Age of Onset  . Hyperlipidemia Mother   . Hyperlipidemia Father     SOCIAL HISTORY: Social History   Socioeconomic History  . Marital status: Married    Spouse name: Not on file  . Number of children: 4  . Years of education: 74  . Highest education level: Not on file  Occupational History  . Occupation: Engineer, petroleum  . Financial resource strain: Not on file  . Food insecurity:    Worry: Not on file    Inability: Not on file  . Transportation needs:    Medical: Not on file    Non-medical: Not on file  Tobacco Use  . Smoking status: Former Research scientist (life sciences)  . Smokeless tobacco: Never Used  Substance and Sexual Activity  . Alcohol use: No  . Drug use: Yes    Types: Marijuana  . Sexual activity: Not on file  Lifestyle  . Physical activity:    Days per week: Not on file    Minutes per session: Not on file  . Stress: Not on file  Relationships  . Social connections:    Talks on phone: Not on file    Gets together: Not on file    Attends religious service: Not on file    Active member of club or organization: Not on file     Attends meetings of clubs or organizations: Not on file    Relationship status: Not on file  . Intimate partner violence:    Fear of current or ex partner: Not on file    Emotionally abused: Not on file    Physically abused: Not on file    Forced sexual activity: Not on file  Other Topics Concern  . Not on file  Social History Narrative   Denies caffeine use      PHYSICAL EXAM  Vitals:   11/27/17 1236  BP: (!) 156/61  Pulse: (!) 48  Weight: 180 lb 6.4 oz (81.8 kg)  Height: 5\' 9"  (1.753 m)   Body mass index is 26.64 kg/m.  Generalized: Well developed, in no acute distress  Head: normocephalic and atraumatic,. Oropharynx benign  Neck: Supple,  Musculoskeletal: No deformity   Neurological examination   Mentation: Alert oriented to time, place, history taking. Attention span and concentration appropriate. Recent and remote memory intact.  Follows all commands speech and language fluent.   Cranial nerve II-XII: Pupils were equal round reactive to light extraocular movements were full, visual field were full on confrontational test. Facial sensation and strength were normal. hearing was intact to finger rubbing bilaterally. Uvula tongue midline. head turning and shoulder shrug were normal and symmetric.Tongue protrusion into cheek strength was normal. Motor: normal bulk and tone, full strength in the BUE, BLE,  Sensory: normal and symmetric to light touch,  Coordination: finger-nose-finger, heel-to-shin bilaterally, no dysmetria Reflexes: Symmetric upper and lower, plantar responses were flexor bilaterally. Gait and Station: Rising up from seated position without assistance, normal stance,  moderate stride, good arm swing, smooth turning, able to perform tiptoe, and heel walking without difficulty. Tandem gait is steady  DIAGNOSTIC DATA (LABS, IMAGING, TESTING) - I reviewed patient records, labs, notes, testing and imaging myself where available.  Lab Results  Component Value  Date   WBC 6.7 09/15/2017   HGB 13.5 09/15/2017   HCT 40.0 09/15/2017   MCV 87.3 09/15/2017   PLT 278 09/15/2017      Component Value Date/Time   NA 141 09/15/2017 1245   K 4.0 09/15/2017 1245   CL 105 09/15/2017 1245   CO2 27 09/15/2017 1245   GLUCOSE 80 09/15/2017 1245   BUN 15 09/15/2017 1245   CREATININE 1.02 09/15/2017 1245   CALCIUM 9.3 09/15/2017 1245   PROT 6.6 09/15/2017 1245   ALBUMIN 4.0 06/17/2016 0945   AST 15 09/15/2017 1245   ALT 12 09/15/2017 1245   ALKPHOS 92 06/17/2016 0945   BILITOT 0.5 09/15/2017 1245   GFRNONAA 78 09/15/2017 1245   GFRAA 90 09/15/2017 1245   Lab Results  Component Value Date   CHOL 162 09/15/2017   HDL 32 (L) 09/15/2017   LDLCALC 102 (H) 09/15/2017   TRIG 168 (H) 09/15/2017   CHOLHDL 5.1 (H) 09/15/2017   Lab Results  Component Value Date   HGBA1C 5.3 11/16/2013   Lab Results  Component Value Date   TKZSWFUX32 355 11/16/2013   Lab Results  Component Value Date   TSH 1.99 05/01/2006      ASSESSMENT AND PLAN Harve Spradley Jonesis a very pleasant 62 year old malewith an underlying medical history of hypertension, reflux disease, allergies, and overweight state, who presents for follow up consultation of hisPLMS and RLS. He had no significant sleep disordered breathing during his baseline sleep study from May 2018. He started Requip with gradual titration from 0.25 mg strength once in the evening to currently a total of 1.50 mg daily total dose, divided typically in 2 doses. He is advised to avoid taking a daytime or morning dose because he has had some sedation from it.  He is also on low-dose gabapentin 100 mg at night only higher doses have caused suicidal idealizations.   Discussed with Dr. Rexene Alberts Increase Requip to 4 pills twice daily for a total dose of 2mg .  Continue Neurontin 1 pill daily 100 mg Take supplemental iron Follow-up with your primary care regarding your blood pressure medications Follow-up here in 6  months Dennie Bible, [facility], [facility], [facility] [phone]  [address] [place], [place] [address] [phone]  I reviewed the above note and documentation by the Nurse Practitioner and agree with the history, physical exam, assessment and plan as outlined above. I was immediately available for face-to-face consultation. Star Age, MD, PhD [facility])

## 2017-11-27 ENCOUNTER — Encounter: Payer: Self-pay | Admitting: Nurse Practitioner

## 2017-11-27 ENCOUNTER — Ambulatory Visit (INDEPENDENT_AMBULATORY_CARE_PROVIDER_SITE_OTHER): Admitting: Nurse Practitioner

## 2017-11-27 ENCOUNTER — Encounter: Payer: Self-pay | Admitting: Family Medicine

## 2017-11-27 DIAGNOSIS — G4761 Periodic limb movement disorder: Secondary | ICD-10-CM | POA: Diagnosis not present

## 2017-11-27 DIAGNOSIS — G2581 Restless legs syndrome: Secondary | ICD-10-CM

## 2017-11-27 MED ORDER — ROPINIROLE HCL 0.25 MG PO TABS: 1.0000 mg | ORAL_TABLET | Freq: Two times a day (BID) | ORAL | 1 refills | Status: DC

## 2017-11-27 NOTE — Patient Instructions (Signed)
Increase Requip to 4 pills twice daily Continue Neurontin 1 pill daily 100 mg Take supplemental iron Follow-up with your primary care regarding your blood pressure medications Follow-up here in 6 months

## 2017-11-28 ENCOUNTER — Other Ambulatory Visit: Payer: Self-pay | Admitting: Family Medicine

## 2017-11-28 ENCOUNTER — Encounter: Payer: Self-pay | Admitting: Family Medicine

## 2017-11-28 MED ORDER — LOSARTAN POTASSIUM 50 MG PO TABS
50.0000 mg | ORAL_TABLET | Freq: Every day | ORAL | 3 refills | Status: DC
Start: 2017-11-28 — End: 2018-08-10

## 2017-12-30 ENCOUNTER — Encounter: Payer: Self-pay | Admitting: Family Medicine

## 2017-12-30 ENCOUNTER — Ambulatory Visit (INDEPENDENT_AMBULATORY_CARE_PROVIDER_SITE_OTHER): Admitting: Family Medicine

## 2017-12-30 VITALS — BP 130/80 | HR 64 | Temp 98.1°F | Resp 14 | Ht 69.0 in | Wt 180.0 lb

## 2017-12-30 DIAGNOSIS — Z1211 Encounter for screening for malignant neoplasm of colon: Secondary | ICD-10-CM | POA: Diagnosis not present

## 2017-12-30 DIAGNOSIS — H905 Unspecified sensorineural hearing loss: Secondary | ICD-10-CM

## 2017-12-30 DIAGNOSIS — I10 Essential (primary) hypertension: Secondary | ICD-10-CM | POA: Diagnosis not present

## 2017-12-30 NOTE — Progress Notes (Signed)
Subjective:    Patient ID: Grant [name], male    DOB: 1955/03/31, 62 y.o.   MRN: [id number]  Medication Refill     06/2016 Here today to establish care.  Patient takes metoprolol due to a history of tachycardia and racing heart rate in the past. On this medication this is prevented. He tried some of this to anxiety. He believes some of his symptoms are due to palpitations secondary to anxiety. He is also on amlodipine for hypertension. His blood pressure here today is elevated. He states his blood pressure at home however is much better and that he is nervous and doctor's office. He also takes Nexium every day for acid reflux. If he stops the medication, within 2 or 3 days the symptoms come back just as bad as before. He denies any blood in his stool. He's had an EGD that was normal. He is also on temazepam for insomnia. He's been taking this medication for years. He would like to try to get off this medication because he knows it is habit forming. He realizes that he is chemically dependent to it and he would like to try to wean off. He is interested in non-addictive options. He is overdue for colonoscopy as well as for prostate cancer screening. He refuses the shingles vaccine today as well as the pneumonia vaccine given his smoking history. He is due for hepatitis C screening. He also reports hypersomnia. He follows asleep very easily just sitting up in a chair. Patient states that he sleeps in a chair every night because of he lays flat on his back he will stop breathing. Frequently throughout the night he will wake up gasping for air and less he sitting up to sleep. He also complains of fatigue and hypersomnolence. He denies any headache.  At that time, my plan was: I will obtain baseline lab work including a CBC, CMP, fasting lipid panel, and a PSA. The patient's blood pressure today is elevated. He will check his blood pressure and his heart rate daily for the next week and notify me of the values.  I'll screen the patient for prostate cancer with a PSA. He defers a colonoscopy at the present time. He will allow me to screen him for hepatitis C. He would also like to proceed with a referral to a sleep specialist for sleep study. He too is concerned that he has sleep apnea. He is afraid that this may be causing some of his fatigue as well as exacerbating his blood pressures. Therefore I will schedule the patient for a split-level sleep study. Recheck blood pressures in 2 weeks  09/15/17 Patient is here today for follow-up of his chronic medical problems.  Since I last saw the patient, he had a sleep study that did not reveal sleep apnea but did show periodic limb movements of sleep along with restless leg syndrome.  He is currently taking Requip under the care of neurologist although he continues to have breakthrough symptoms.  They recently increased his dose but he is yet to see benefit.  His blood pressure today is excellent.  Is actually a little on the low side.  He does complain of fatigue during the day and also swelling in his legs.  He also complains of an aching discomfort in his legs however this improves with movement making me believe is most likely restless leg syndrome.  He has normal popliteal pulses bilaterally.  He has normal dorsalis pedis and posterior tibialis pulses bilaterally.  He is due for prostate cancer screening.  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  Apparently does not appear to be working as well.  Even despite taking it with food he still has breakthrough symptoms.  He would like to switch to a different PPI.  At that time, my plan was: Blood pressure today is well controlled.  I have recommended temporarily discontinuing amlodipine and monitoring his blood pressure more closely.  If less than 140/90, we will discontinue amlodipine altogether permanently.  He will continue metoprolol as he uses medication to help  prevent and manage palpitations and PVCs.  I will check a CMP and a fasting lipid panel.  I will also check a CBC as recently he was found to have some mild anemia.  He has had borderline low iron in the past and if his anemia is worsening, he may benefit from iron supplementation also to treat his restless leg syndrome.  I will screen the patient for prostate cancer with a PSA.  I offer the patient a referral for a colonoscopy but he pleasantly declined  12/30/17 Since I last saw the patient, he discontinue metoprolol and replace it with losartan to see if this would help with his restless legs.  It has actually help with his restless leg syndrome.  He has not noticed as many PVCs despite coming off the metoprolol.  He feels, "as good as I have in years".  Losartan is managing his blood pressure well.  Is 130/80 today.  He denies any side effects on the medication.  However he is here today reporting hearing loss in his left ear.  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  He always wear his ear set over his left ear.  He would frequently get static burst which were intense burst of loud noise in his left ear.  He states that on his service discharge paperwork, he had hearing loss documented in his left ear.  Today he reports persistent hearing loss in his left ear is as well as tinnitus in his left ear.  [employer] and [employer] were performed today and suggests sensorineural hearing loss.  He denies any vertigo.  He denies any ear pain.  There is no cerumen impaction in his left ear on exam.  He is also overdue for a flu shot which he continues to refuse.  He is due for colon cancer screening we spent 10 minutes discussing Cologuard Past Medical History:  Diagnosis Date  . Acid reflux   . Allergy   . Anxiety   . Hypertension   . Insomnia    Past Surgical History:  Procedure Laterality Date  . INCISION AND DRAINAGE Right 02/04/2013   Procedure: INCISION AND DRAINAGE;  Surgeon: Linna Hoff, MD;  Location: [place];  Service: Orthopedics;  Laterality: Right;  . OPEN REDUCTION INTERNAL FIXATION (ORIF) FINGER WITH RADIAL BONE GRAFT Right 02/04/2013   Procedure: OPEN REDUCTION INTERNAL FIXATION (ORIF) right ring and small fingers;  Surgeon: Linna Hoff, MD;  Location: [place];  Service: Orthopedics;  Laterality: Right;   Current Outpatient Medications on File Prior to Visit  Medication Sig Dispense Refill  . EPINEPHrine 0.3 mg/0.3 mL IJ SOAJ injection Inject 0.3 mLs (0.3 mg total) into the muscle once for 1 dose. 2 Device 3  . gabapentin (NEURONTIN) 100 MG capsule 1 pill each night for 3 days, may increase to 2 times a  day for 3 days, and up to 1 pill 3 times a day 90 capsule 3  . losartan (COZAAR) 50 MG tablet Take 1 tablet (50 mg total) by mouth daily. 90 tablet 3  . pantoprazole (PROTONIX) 40 MG tablet Take 1 tablet (40 mg total) by mouth daily. 90 tablet 3  . rOPINIRole (REQUIP) 0.25 MG tablet Take 4 tablets (1 mg total) by mouth 2 (two) times daily. 4 pills up to 2 times a day. 720 tablet 1   No current facility-administered medications on file prior to visit.    Allergies  Allergen Reactions  . Asa [Aspirin] Other (See Comments)    Gi upset  . Bee Venom Swelling and Rash   Social History   Socioeconomic History  . Marital status: Married    Spouse name: Not on file  . Number of children: 4  . Years of education: 11  . Highest education level: Not on file  Occupational History  . Occupation: Engineer, petroleum  . Financial resource strain: Not on file  . Food insecurity:    Worry: Not on file    Inability: Not on file  . Transportation needs:    Medical: Not on file    Non-medical: Not on file  Tobacco Use  . Smoking status: Former Research scientist (life sciences)  . Smokeless tobacco: Never Used  Substance and Sexual Activity  . Alcohol use: No  . Drug use: Yes    Types: Marijuana  . Sexual activity: Not on file  Lifestyle  . Physical activity:    Days per  week: Not on file    Minutes per session: Not on file  . Stress: Not on file  Relationships  . Social connections:    Talks on phone: Not on file    Gets together: Not on file    Attends religious service: Not on file    Active member of club or organization: Not on file    Attends meetings of clubs or organizations: Not on file    Relationship status: Not on file  . Intimate partner violence:    Fear of current or ex partner: Not on file    Emotionally abused: Not on file    Physically abused: Not on file    Forced sexual activity: Not on file  Other Topics Concern  . Not on file  Social History Narrative   Denies caffeine use    Family History  Problem Relation Age of Onset  . Hyperlipidemia Mother   . Hyperlipidemia Father        Review of Systems  All other systems reviewed and are negative.      Objective:   Physical Exam  Constitutional: He is oriented to person, place, and time. He appears well-developed and well-nourished. No distress.  HENT:  Head: Normocephalic and atraumatic.  Right Ear: External ear normal.  Left Ear: External ear normal.  Nose: Nose normal.  Mouth/Throat: Oropharynx is clear and moist. No oropharyngeal exudate.  Eyes: Pupils are equal, round, and reactive to light. Conjunctivae and EOM are normal. Right eye exhibits no discharge. Left eye exhibits no discharge. No scleral icterus.  Neck: Normal range of motion. Neck supple. No JVD present. No tracheal deviation present. No thyromegaly present.  Cardiovascular: Normal rate, regular rhythm, normal heart sounds and intact distal pulses. Exam reveals no gallop and no friction rub.  No murmur heard. Pulmonary/Chest: Effort normal and breath sounds normal. No stridor. No respiratory distress. He has no wheezes.  He has no rales. He exhibits no tenderness.  Abdominal: Soft. Bowel sounds are normal. He exhibits no distension and no mass. There is no tenderness. There is no rebound and no guarding.   Musculoskeletal: Normal range of motion. He exhibits no edema or tenderness.  Lymphadenopathy:    He has no cervical adenopathy.  Neurological: He is alert and oriented to person, place, and time. He has normal reflexes. No cranial nerve deficit. He exhibits normal muscle tone. Coordination normal.  Skin: Skin is warm. No rash noted. He is not diaphoretic. No erythema. No pallor.  Psychiatric: He has a normal mood and affect. His behavior is normal. Judgment and thought content normal.  Vitals reviewed.         Assessment & Plan:  Sensorineural hearing loss (SNHL), unspecified laterality  Benign essential HTN  Colon cancer screening  Blood pressures well controlled on losartan.  I will make no changes in his medication at this time.  He declines a flu shot.  I will refer the patient to audiology for a formal hearing evaluation and consideration for hearing aids.  I will schedule the patient for Cologuard for colon cancer screening.

## 2018-01-26 ENCOUNTER — Encounter: Payer: Self-pay | Admitting: Family Medicine

## 2018-01-26 DIAGNOSIS — H905 Unspecified sensorineural hearing loss: Secondary | ICD-10-CM | POA: Insufficient documentation

## 2018-02-11 ENCOUNTER — Encounter: Payer: Self-pay | Admitting: Family Medicine

## 2018-02-11 ENCOUNTER — Encounter: Payer: Self-pay | Admitting: Neurology

## 2018-02-12 ENCOUNTER — Encounter: Payer: Self-pay | Admitting: Family Medicine

## 2018-02-17 ENCOUNTER — Encounter: Payer: Self-pay | Admitting: Family Medicine

## 2018-02-17 ENCOUNTER — Ambulatory Visit (INDEPENDENT_AMBULATORY_CARE_PROVIDER_SITE_OTHER): Admitting: Family Medicine

## 2018-02-17 VITALS — BP 150/80 | HR 60 | Temp 98.1°F | Resp 16 | Ht 69.0 in | Wt 178.0 lb

## 2018-02-17 DIAGNOSIS — G5622 Lesion of ulnar nerve, left upper limb: Secondary | ICD-10-CM

## 2018-02-17 MED ORDER — PREDNISONE 20 MG PO TABS: ORAL_TABLET | ORAL | 0 refills | Status: DC

## 2018-02-17 NOTE — Progress Notes (Signed)
Subjective:    Patient ID: Grant [name], male    DOB: 1956-02-18, 62 y.o.   MRN: [id number]  HPI Patient presents with a one-week history of pain in his left hand.  The pain begins near the ulnar groove at his left elbow.  It radiates down the medial aspect of his left forearm into his fourth and fifth digits.  It is a burning shooting stinging pain.  He also occasionally has some pain in his middle finger and some numbness in his middle finger.  The pain radiates from his elbow up to his left shoulder.  He denies any specific injuries or trauma Past Medical History:  Diagnosis Date  . Acid reflux   . Allergy   . Anxiety   . Hypertension   . Insomnia   . Sensorineural hearing loss    Past Surgical History:  Procedure Laterality Date  . INCISION AND DRAINAGE Right 02/04/2013   Procedure: INCISION AND DRAINAGE;  Surgeon: Linna Hoff, MD;  Location: [facility];  Service: Orthopedics;  Laterality: Right;  . OPEN REDUCTION INTERNAL FIXATION (ORIF) FINGER WITH RADIAL BONE GRAFT Right 02/04/2013   Procedure: OPEN REDUCTION INTERNAL FIXATION (ORIF) right ring and small fingers;  Surgeon: Linna Hoff, MD;  Location: [facility];  Service: Orthopedics;  Laterality: Right;   Current Outpatient Medications on File Prior to Visit  Medication Sig Dispense Refill  . losartan (COZAAR) 50 MG tablet Take 1 tablet (50 mg total) by mouth daily. 90 tablet 3  . pantoprazole (PROTONIX) 40 MG tablet Take 1 tablet (40 mg total) by mouth daily. 90 tablet 3  . rOPINIRole (REQUIP) 0.25 MG tablet Take 4 tablets (1 mg total) by mouth 2 (two) times daily. 4 pills up to 2 times a day. (Patient taking differently: Take 1 mg by mouth 2 (two) times daily. 2 pills up to 4 times a day.) 720 tablet 1  . EPINEPHrine 0.3 mg/0.3 mL IJ SOAJ injection Inject 0.3 mLs (0.3 mg total) into the muscle once for 1 dose. 2 Device 3   No current facility-administered medications on file prior to visit.    Allergies  Allergen Reactions    . Asa [Aspirin] Other (See Comments)    Gi upset  . Bee Venom Swelling and Rash   Social History   Socioeconomic History  . Marital status: Married    Spouse name: Not on file  . Number of children: 4  . Years of education: 48  . Highest education level: Not on file  Occupational History  . Occupation: Engineer, petroleum  . Financial resource strain: Not on file  . Food insecurity:    Worry: Not on file    Inability: Not on file  . Transportation needs:    Medical: Not on file    Non-medical: Not on file  Tobacco Use  . Smoking status: Former Research scientist (life sciences)  . Smokeless tobacco: Never Used  Substance and Sexual Activity  . Alcohol use: No  . Drug use: Yes    Types: Marijuana  . Sexual activity: Not on file  Lifestyle  . Physical activity:    Days per week: Not on file    Minutes per session: Not on file  . Stress: Not on file  Relationships  . Social connections:    Talks on phone: Not on file    Gets together: Not on file    Attends religious service: Not on file    Active member of club  or organization: Not on file    Attends meetings of clubs or organizations: Not on file    Relationship status: Not on file  . Intimate partner violence:    Fear of current or ex partner: Not on file    Emotionally abused: Not on file    Physically abused: Not on file    Forced sexual activity: Not on file  Other Topics Concern  . Not on file  Social History Narrative   Denies caffeine use       Review of Systems  All other systems reviewed and are negative.      Objective:   Physical Exam  Cardiovascular: Normal rate, regular rhythm and normal heart sounds.  Pulmonary/Chest: Effort normal and breath sounds normal.  Musculoskeletal:       Left elbow: He exhibits normal range of motion, no swelling, no effusion and no deformity. Tenderness found.       Left hand: He exhibits normal range of motion, no tenderness, no bony tenderness and normal two-point  discrimination. Decreased sensation noted. Decreased sensation is present in the ulnar distribution. Normal strength noted.  Vitals reviewed.   Tender to percussion in the ulnar groove of the left elbow      Assessment & Plan:  Ulnar neuropathy of left upper extremity - Plan: predniSONE (DELTASONE) 20 MG tablet  I suspect ulnar neuropathy.  Recommended a prednisone taper pack as an anti-inflammatory and then reassess in 1 week.  If symptoms persist, I would recommend an x-ray of the left elbow as well as EMG/nerve conduction studies of the left upper extremity to rule out cervical radiculopathy or other potential causes of nerve irritation.

## 2018-02-17 NOTE — Telephone Encounter (Signed)
Pt seen in office today.

## 2018-02-19 ENCOUNTER — Ambulatory Visit: Admitting: Family Medicine

## 2018-02-24 ENCOUNTER — Encounter: Payer: Self-pay | Admitting: Family Medicine

## 2018-02-24 DIAGNOSIS — G5622 Lesion of ulnar nerve, left upper limb: Secondary | ICD-10-CM

## 2018-03-27 ENCOUNTER — Encounter: Payer: Self-pay | Admitting: Family Medicine

## 2018-04-03 ENCOUNTER — Encounter: Payer: Self-pay | Admitting: Family Medicine

## 2018-04-03 ENCOUNTER — Ambulatory Visit (INDEPENDENT_AMBULATORY_CARE_PROVIDER_SITE_OTHER): Admitting: Family Medicine

## 2018-04-03 VITALS — BP 128/70 | HR 48 | Temp 98.3°F | Resp 18 | Ht 69.0 in | Wt 178.0 lb

## 2018-04-03 DIAGNOSIS — K529 Noninfective gastroenteritis and colitis, unspecified: Secondary | ICD-10-CM

## 2018-04-03 MED ORDER — CIPROFLOXACIN HCL 500 MG PO TABS: 500.0000 mg | ORAL_TABLET | Freq: Two times a day (BID) | ORAL | 0 refills | Status: DC

## 2018-04-03 MED ORDER — METRONIDAZOLE 500 MG PO TABS: 500.0000 mg | ORAL_TABLET | Freq: Two times a day (BID) | ORAL | 0 refills | Status: DC

## 2018-04-03 NOTE — Addendum Note (Signed)
Addended by: Shary Decamp B on: 04/03/2018 11:37 AM   Modules accepted: Orders

## 2018-04-03 NOTE — Progress Notes (Signed)
Subjective:    Patient ID: Grant [name], male    DOB: [birth year], 63 y.o.   MRN: [id number]  Patient states that symptoms began approximately 2 to 3 weeks ago.  He developed sudden sharp left lower quadrant abdominal pain.  He went to the bathroom and had a "blowout diarrhea".  Since that time over the last 2 weeks, he has had recurrent episodes of sharp extremely painful left lower quadrant abdominal pain and diarrhea.  However the confusing finding is that it seems to come and go.  He states that he will be okay for a day or so and then have another attack.  He is extremely tender to palpation in the left lower quadrant today.  He has voluntary guarding in this area.  He also has extremely hyperactive bowel sounds.  He has no rebound tenderness.  The abdomen is soft and nondistended.  Review of his past records show in 2005 a CAT scan showing colitis near the cecum presumed infectious first inflammatory origin.  Patient does not recollect that.  He has no family history of inflammatory bowel disease. Past Medical History:  Diagnosis Date  . Acid reflux   . Allergy   . Anxiety   . Hypertension   . Insomnia   . Sensorineural hearing loss    Past Surgical History:  Procedure Laterality Date  . INCISION AND DRAINAGE Right 02/04/2013   Procedure: INCISION AND DRAINAGE;  Surgeon: Linna Hoff, MD;  Location: [facility];  Service: Orthopedics;  Laterality: Right;  . OPEN REDUCTION INTERNAL FIXATION (ORIF) FINGER WITH RADIAL BONE GRAFT Right 02/04/2013   Procedure: OPEN REDUCTION INTERNAL FIXATION (ORIF) right ring and small fingers;  Surgeon: Linna Hoff, MD;  Location: [facility];  Service: Orthopedics;  Laterality: Right;   Current Outpatient Medications on File Prior to Visit  Medication Sig Dispense Refill  . losartan (COZAAR) 50 MG tablet Take 1 tablet (50 mg total) by mouth daily. 90 tablet 3  . pantoprazole (PROTONIX) 40 MG tablet Take 1 tablet (40 mg total) by mouth daily. 90 tablet 3  .  rOPINIRole (REQUIP) 0.25 MG tablet Take 4 tablets (1 mg total) by mouth 2 (two) times daily. 4 pills up to 2 times a day. (Patient taking differently: Take 1 mg by mouth 2 (two) times daily. 2 pills up to 4 times a day.) 720 tablet 1  . EPINEPHrine 0.3 mg/0.3 mL IJ SOAJ injection Inject 0.3 mLs (0.3 mg total) into the muscle once for 1 dose. 2 Device 3   No current facility-administered medications on file prior to visit.    Allergies  Allergen Reactions  . Asa [Aspirin] Other (See Comments)    Gi upset  . Bee Venom Swelling and Rash   Social History   Socioeconomic History  . Marital status: Married    Spouse name: Not on file  . Number of children: 4  . Years of education: 69  . Highest education level: Not on file  Occupational History  . Occupation: Engineer, petroleum  . Financial resource strain: Not on file  . Food insecurity:    Worry: Not on file    Inability: Not on file  . Transportation needs:    Medical: Not on file    Non-medical: Not on file  Tobacco Use  . Smoking status: Former Research scientist (life sciences)  . Smokeless tobacco: Never Used  Substance and Sexual Activity  . Alcohol use: No  . Drug use: Yes  Types: Marijuana  . Sexual activity: Not on file  Lifestyle  . Physical activity:    Days per week: Not on file    Minutes per session: Not on file  . Stress: Not on file  Relationships  . Social connections:    Talks on phone: Not on file    Gets together: Not on file    Attends religious service: Not on file    Active member of club or organization: Not on file    Attends meetings of clubs or organizations: Not on file    Relationship status: Not on file  . Intimate partner violence:    Fear of current or ex partner: Not on file    Emotionally abused: Not on file    Physically abused: Not on file    Forced sexual activity: Not on file  Other Topics Concern  . Not on file  Social History Narrative   Denies caffeine use    Family History  Problem  Relation Age of Onset  . Hyperlipidemia Mother   . Hyperlipidemia Father        Review of Systems  All other systems reviewed and are negative.      Objective:   Physical Exam  Constitutional: He is oriented to person, place, and time. He appears well-developed and well-nourished. No distress.  HENT:  Head: Normocephalic and atraumatic.  Right Ear: External ear normal.  Left Ear: External ear normal.  Nose: Nose normal.  Mouth/Throat: Oropharynx is clear and moist. No oropharyngeal exudate.  Eyes: Pupils are equal, round, and reactive to light. Conjunctivae and EOM are normal. Right eye exhibits no discharge. Left eye exhibits no discharge. No scleral icterus.  Neck: Normal range of motion. Neck supple. No JVD present. No tracheal deviation present. No thyromegaly present.  Cardiovascular: Normal rate, regular rhythm, normal heart sounds and intact distal pulses. Exam reveals no gallop and no friction rub.  No murmur heard. Pulmonary/Chest: Effort normal and breath sounds normal. No stridor. No respiratory distress. He has no wheezes. He has no rales. He exhibits no tenderness.  Abdominal: Soft. Bowel sounds are normal. He exhibits no distension and no mass. There is abdominal tenderness. There is guarding. There is no rebound.  Musculoskeletal: Normal range of motion.        General: No tenderness or edema.  Lymphadenopathy:    He has no cervical adenopathy.  Neurological: He is alert and oriented to person, place, and time. He has normal reflexes. No cranial nerve deficit. He exhibits normal muscle tone. Coordination normal.  Skin: Skin is warm. No rash noted. He is not diaphoretic. No erythema. No pallor.  Psychiatric: He has a normal mood and affect. His behavior is normal. Judgment and thought content normal.  Vitals reviewed.         Assessment & Plan:  Colitis - Plan: CBC with Differential/Platelet, COMPLETE METABOLIC PANEL WITH GFR, Sedimentation rate, Celiac  Disease Comprehensive Panel w Reflexes, Infant  Clinically the patient has colitis.  Differential diagnosis includes infectious colitis such as diverticulitis versus inflammatory bowel disease such as ulcerative colitis versus some type of food allergy versus irritable bowel syndrome.  I will treat the patient empirically for infectious colitis with Cipro 500 mg p.o. twice daily for 10 days and Flagyl 500 mg p.o. twice daily for 10 days.  I will screen the patient for celiac disease.  If symptoms worsen, he needs imaging of the abdomen and pelvis as well as a GI consultation after imaging to  evaluate for inflammatory bowel disease with a colonoscopy.

## 2018-04-04 LAB — CBC WITH DIFFERENTIAL/PLATELET
Absolute Monocytes: 419 cells/uL (ref 200–950)
BASOS ABS: 32 {cells}/uL (ref 0–200)
Basophils Relative: 0.7 %
Eosinophils Absolute: 179 cells/uL (ref 15–500)
Eosinophils Relative: 3.9 %
HCT: 37.8 % — ABNORMAL LOW (ref 38.5–50.0)
Hemoglobin: 12.9 g/dL — ABNORMAL LOW (ref 13.2–17.1)
Lymphs Abs: 1260 cells/uL (ref 850–3900)
MCH: 30.1 pg (ref 27.0–33.0)
MCHC: 34.1 g/dL (ref 32.0–36.0)
MCV: 88.3 fL (ref 80.0–100.0)
MPV: 9.6 fL (ref 7.5–12.5)
Monocytes Relative: 9.1 %
Neutro Abs: 2709 cells/uL (ref 1500–7800)
Neutrophils Relative %: 58.9 %
Platelets: 253 10*3/uL (ref 140–400)
RBC: 4.28 10*6/uL (ref 4.20–5.80)
RDW: 12.4 % (ref 11.0–15.0)
Total Lymphocyte: 27.4 %
WBC: 4.6 10*3/uL (ref 3.8–10.8)

## 2018-04-04 LAB — COMPLETE METABOLIC PANEL WITH GFR
AG RATIO: 2 (calc) (ref 1.0–2.5)
ALT: 20 U/L (ref 9–46)
AST: 22 U/L (ref 10–35)
Albumin: 4 g/dL (ref 3.6–5.1)
Alkaline phosphatase (APISO): 89 U/L (ref 40–115)
BUN: 12 mg/dL (ref 7–25)
CO2: 27 mmol/L (ref 20–32)
Calcium: 9.2 mg/dL (ref 8.6–10.3)
Chloride: 106 mmol/L (ref 98–110)
Creat: 0.87 mg/dL (ref 0.70–1.25)
GFR, Est African American: 106 mL/min/{1.73_m2} (ref >=60)
GFR, Est Non African American: 92 mL/min/{1.73_m2} (ref >=60)
GLOBULIN: 2 g/dL (ref 1.9–3.7)
Glucose, Bld: 91 mg/dL (ref 65–99)
POTASSIUM: 4 mmol/L (ref 3.5–5.3)
Sodium: 141 mmol/L (ref 135–146)
Total Bilirubin: 0.6 mg/dL (ref 0.2–1.2)
Total Protein: 6 g/dL — ABNORMAL LOW (ref 6.1–8.1)

## 2018-04-04 LAB — SEDIMENTATION RATE: Sed Rate: 6 mm/h (ref 0–20)

## 2018-04-06 ENCOUNTER — Encounter: Payer: Self-pay | Admitting: Family Medicine

## 2018-04-06 LAB — CELIAC DISEASE PANEL
(tTG) Ab, IgA: 1 U/mL
(tTG) Ab, IgG: 1 U/mL
GLIADIN IGA: 3 U
Gliadin IgG: 2 Units
Immunoglobulin A: 137 mg/dL (ref 70–320)

## 2018-04-08 ENCOUNTER — Encounter: Payer: Self-pay | Admitting: Family Medicine

## 2018-04-09 ENCOUNTER — Other Ambulatory Visit: Payer: Self-pay | Admitting: *Deleted

## 2018-04-09 DIAGNOSIS — R1084 Generalized abdominal pain: Secondary | ICD-10-CM

## 2018-04-09 DIAGNOSIS — G5622 Lesion of ulnar nerve, left upper limb: Secondary | ICD-10-CM | POA: Insufficient documentation

## 2018-04-09 DIAGNOSIS — K529 Noninfective gastroenteritis and colitis, unspecified: Secondary | ICD-10-CM

## 2018-04-27 ENCOUNTER — Ambulatory Visit
Admission: RE | Admit: 2018-04-27 | Discharge: 2018-04-27 | Disposition: A | Discharge status: Home / Self Care | Source: Ambulatory Visit | Attending: Family Medicine | Admitting: Family Medicine

## 2018-04-27 DIAGNOSIS — R1084 Generalized abdominal pain: Secondary | ICD-10-CM

## 2018-04-27 MED ORDER — IOPAMIDOL (ISOVUE-300) INJECTION 61%
100.0000 mL | Freq: Once | INTRAVENOUS | Status: AC | PRN
  Administered 2018-04-27: 100 mL via INTRAVENOUS

## 2018-04-29 ENCOUNTER — Encounter: Payer: Self-pay | Admitting: Family Medicine

## 2018-05-01 ENCOUNTER — Ambulatory Visit (INDEPENDENT_AMBULATORY_CARE_PROVIDER_SITE_OTHER): Admitting: Family Medicine

## 2018-05-01 ENCOUNTER — Encounter: Payer: Self-pay | Admitting: Family Medicine

## 2018-05-01 VITALS — BP 120/60 | HR 54 | Temp 98.1°F | Resp 12 | Ht 69.0 in | Wt 176.0 lb

## 2018-05-01 DIAGNOSIS — R1084 Generalized abdominal pain: Secondary | ICD-10-CM

## 2018-05-01 DIAGNOSIS — Z125 Encounter for screening for malignant neoplasm of prostate: Secondary | ICD-10-CM

## 2018-05-01 DIAGNOSIS — Z1211 Encounter for screening for malignant neoplasm of colon: Secondary | ICD-10-CM | POA: Diagnosis not present

## 2018-05-01 NOTE — Progress Notes (Signed)
Subjective:    Patient ID: Grant [name], male    DOB: 01/02/1956, 63 y.o.   MRN: [id number] 04/03/18 Patient states that symptoms began approximately 2 to 3 weeks ago.  He developed sudden sharp left lower quadrant abdominal pain.  He went to the bathroom and had a "blowout diarrhea".  Since that time over the last 2 weeks, he has had recurrent episodes of sharp extremely painful left lower quadrant abdominal pain and diarrhea.  However the confusing finding is that it seems to come and go.  He states that he will be okay for a day or so and then have another attack.  He is extremely tender to palpation in the left lower quadrant today.  He has voluntary guarding in this area.  He also has extremely hyperactive bowel sounds.  He has no rebound tenderness.  The abdomen is soft and nondistended.  Review of his past records show in 2005 a CAT scan showing colitis near the cecum presumed infectious first inflammatory origin.  Patient does not recollect that.  He has no family history of inflammatory bowel disease.  At that time, my plan was: Clinically the patient has colitis.  Differential diagnosis includes infectious colitis such as diverticulitis versus inflammatory bowel disease such as ulcerative colitis versus some type of food allergy versus irritable bowel syndrome.  I will treat the patient empirically for infectious colitis with Cipro 500 mg p.o. twice daily for 10 days and Flagyl 500 mg p.o. twice daily for 10 days.  I will screen the patient for celiac disease.  If symptoms worsen, he needs imaging of the abdomen and pelvis as well as a GI consultation after imaging to evaluate for inflammatory bowel disease with a colonoscopy.  05/01/18 CT scan revealed:  IMPRESSION: 1. No evident bowel obstruction or bowel wall thickening. No abscess in the abdomen or pelvis. Appendix appears normal.  2.  No evident renal or ureteral calculus.  No hydronephrosis.  3. Multiple prostatic calculi. There  is loss of fat plane between the inferior bladder in the prostate. Advise clinical assessment of the prostate and correlation with PSA in this regard.  4. Focal hiatal hernia. Inguinal hernias bilaterally containing only fat.  5.  Aortic atherosclerosis.  Patient states that he continues to have pain in the left lower quadrant.  However now the pain is more in the left inguinal canal area.  He states that he has a pain with prolonged standing.  He has a pain occasionally with Valsalva due to coughing.  Occasionally he will notice some swelling in the inguinal canal that improves if he lays down.  The pain sounds like it may be more due to to the inguinal hernias that were seen particular on the left side.  However he is also overdue for colonoscopy and would like to see GI for screening colonoscopy.  It is been more than 6 months since his last PSA.  He denies any lower urinary tract symptoms.  He denies any dysuria or hematuria Past Medical History:  Diagnosis Date  . Acid reflux   . Allergy   . Anxiety   . Hypertension   . Insomnia   . Sensorineural hearing loss    Past Surgical History:  Procedure Laterality Date  . INCISION AND DRAINAGE Right 02/04/2013   Procedure: INCISION AND DRAINAGE;  Surgeon: Linna Hoff, MD;  Location: [facility];  Service: Orthopedics;  Laterality: Right;  . OPEN REDUCTION INTERNAL FIXATION (ORIF) FINGER WITH RADIAL BONE  GRAFT Right 02/04/2013   Procedure: OPEN REDUCTION INTERNAL FIXATION (ORIF) right ring and small fingers;  Surgeon: Linna Hoff, MD;  Location: [facility];  Service: Orthopedics;  Laterality: Right;   Current Outpatient Medications on File Prior to Visit  Medication Sig Dispense Refill  . ciprofloxacin (CIPRO) 500 MG tablet Take 1 tablet (500 mg total) by mouth 2 (two) times daily. 20 tablet 0  . EPINEPHrine 0.3 mg/0.3 mL IJ SOAJ injection Inject 0.3 mLs (0.3 mg total) into the muscle once for 1 dose. 2 Device 3  . losartan (COZAAR) 50 MG  tablet Take 1 tablet (50 mg total) by mouth daily. 90 tablet 3  . metroNIDAZOLE (FLAGYL) 500 MG tablet Take 1 tablet (500 mg total) by mouth 2 (two) times daily. 20 tablet 0  . pantoprazole (PROTONIX) 40 MG tablet Take 1 tablet (40 mg total) by mouth daily. 90 tablet 3  . rOPINIRole (REQUIP) 0.25 MG tablet Take 4 tablets (1 mg total) by mouth 2 (two) times daily. 4 pills up to 2 times a day. (Patient taking differently: Take 1 mg by mouth 2 (two) times daily. 2 pills up to 4 times a day.) 720 tablet 1   No current facility-administered medications on file prior to visit.    Allergies  Allergen Reactions  . Asa [Aspirin] Other (See Comments)    Gi upset  . Bee Venom Swelling and Rash   Social History   Socioeconomic History  . Marital status: Married    Spouse name: Not on file  . Number of children: 4  . Years of education: 41  . Highest education level: Not on file  Occupational History  . Occupation: Engineer, petroleum  . Financial resource strain: Not on file  . Food insecurity:    Worry: Not on file    Inability: Not on file  . Transportation needs:    Medical: Not on file    Non-medical: Not on file  Tobacco Use  . Smoking status: Former Research scientist (life sciences)  . Smokeless tobacco: Never Used  Substance and Sexual Activity  . Alcohol use: No  . Drug use: Yes    Types: Marijuana  . Sexual activity: Not on file  Lifestyle  . Physical activity:    Days per week: Not on file    Minutes per session: Not on file  . Stress: Not on file  Relationships  . Social connections:    Talks on phone: Not on file    Gets together: Not on file    Attends religious service: Not on file    Active member of club or organization: Not on file    Attends meetings of clubs or organizations: Not on file    Relationship status: Not on file  . Intimate partner violence:    Fear of current or ex partner: Not on file    Emotionally abused: Not on file    Physically abused: Not on file     Forced sexual activity: Not on file  Other Topics Concern  . Not on file  Social History Narrative   Denies caffeine use    Family History  Problem Relation Age of Onset  . Hyperlipidemia Mother   . Hyperlipidemia Father        Review of Systems  All other systems reviewed and are negative.      Objective:   Physical Exam  Constitutional: He is oriented to person, place, and time. He appears well-developed and well-nourished. No  distress.  HENT:  Head: Normocephalic and atraumatic.  Right Ear: External ear normal.  Left Ear: External ear normal.  Nose: Nose normal.  Mouth/Throat: Oropharynx is clear and moist. No oropharyngeal exudate.  Eyes: Pupils are equal, round, and reactive to light. Conjunctivae and EOM are normal. Right eye exhibits no discharge. Left eye exhibits no discharge. No scleral icterus.  Neck: Normal range of motion. Neck supple. No JVD present. No tracheal deviation present. No thyromegaly present.  Cardiovascular: Normal rate, regular rhythm, normal heart sounds and intact distal pulses. Exam reveals no gallop and no friction rub.  No murmur heard. Pulmonary/Chest: Effort normal and breath sounds normal. No stridor. No respiratory distress. He has no wheezes. He has no rales. He exhibits no tenderness.  Abdominal: Soft. Bowel sounds are normal. He exhibits no distension and no mass. There is abdominal tenderness. There is guarding. There is no rebound.  Musculoskeletal: Normal range of motion.        General: No tenderness or edema.  Lymphadenopathy:    He has no cervical adenopathy.  Neurological: He is alert and oriented to person, place, and time. He has normal reflexes. No cranial nerve deficit. He exhibits normal muscle tone. Coordination normal.  Skin: Skin is warm. No rash noted. He is not diaphoretic. No erythema. No pallor.  Psychiatric: He has a normal mood and affect. His behavior is normal. Judgment and thought content normal.  Vitals  reviewed.         Assessment & Plan:  Prostate cancer screening - Plan: PSA  Generalized abdominal pain - Plan: Ambulatory referral to Gastroenterology  Colon cancer screening - Plan: Ambulatory referral to Gastroenterology  I will schedule the patient to meet with a gastroenterologist.  He is due for a screening colonoscopy.  However I believe the majority of his pain is likely due to his inguinal hernia on the left side.  Therefore if his colonoscopy is negative, I would recommend a general surgery consultation to repair the inguinal hernia.  We discussed his hiatal hernia and aortic atherosclerosis.  No treatment is necessary for those at the present time other than risk factor modification.  However his cholesterol and blood pressure are excellent.  We will repeat a PSA every 6 months due to the clarity seen in his prostate.  If the PSA jumped considerably, I would recommend a urology consultation.  Patient is comfortable with this plan.

## 2018-05-02 LAB — PSA: PSA: 1.8 ng/mL (ref <=4.0)

## 2018-05-04 ENCOUNTER — Other Ambulatory Visit: Payer: Self-pay | Admitting: Family Medicine

## 2018-05-04 ENCOUNTER — Encounter: Payer: Self-pay | Admitting: Family Medicine

## 2018-05-04 DIAGNOSIS — K4090 Unilateral inguinal hernia, without obstruction or gangrene, not specified as recurrent: Secondary | ICD-10-CM

## 2018-05-07 ENCOUNTER — Encounter: Payer: Self-pay | Admitting: Family Medicine

## 2018-05-07 ENCOUNTER — Ambulatory Visit: Admitting: Family Medicine

## 2018-05-08 ENCOUNTER — Ambulatory Visit (INDEPENDENT_AMBULATORY_CARE_PROVIDER_SITE_OTHER): Admitting: Surgery

## 2018-05-08 ENCOUNTER — Encounter: Payer: Self-pay | Admitting: Surgery

## 2018-05-08 ENCOUNTER — Other Ambulatory Visit: Payer: Self-pay

## 2018-05-08 VITALS — BP 144/74 | HR 71 | Temp 98.1°F | Ht 69.0 in | Wt 176.8 lb

## 2018-05-08 DIAGNOSIS — K4020 Bilateral inguinal hernia, without obstruction or gangrene, not specified as recurrent: Secondary | ICD-10-CM | POA: Diagnosis not present

## 2018-05-08 NOTE — Patient Instructions (Addendum)
You are scheduled for surgery with Dr.Piscoya at [facility] on 05/13/18. You will pre admit by phone and Pre Admit testing will contact you to do this. Please refer to your surgery instruction sheet.   Call the office with any questions or concerns.  Open Hernia Repair, Adult  Open hernia repair is a surgical procedure to fix a hernia. A hernia occurs when an internal organ or tissue pushes out through a weak spot in the abdominal wall muscles. Hernias commonly occur in the groin and around the navel. Most hernias tend to get worse over time. Often, surgery is done to prevent the hernia from becoming bigger, uncomfortable, or an emergency. Emergency surgery may be needed if abdominal contents get stuck in the opening (incarcerated hernia) or the blood supply gets cut off (strangulated hernia). In an open repair, an incision is made in the abdomen to perform the surgery. Tell a health care provider about:  Any allergies you have.  All medicines you are taking, including vitamins, herbs, eye drops, creams, and over-the-counter medicines.  Any problems you or family members have had with anesthetic medicines.  Any blood or bone disorders you have.  Any surgeries you have had.  Any medical conditions you have, including any recent cold or flu symptoms.  Whether you are pregnant or may be pregnant. What are the risks? Generally, this is a safe procedure. However, problems may occur, including:  Long-lasting (chronic) pain.  Bleeding.  Infection.  Damage to the testicle. This can cause shrinking or swelling.  Damage to the bladder, blood vessels, intestine, or nerves near the hernia.  Trouble passing urine.  Allergic reactions to medicines.  Return of the hernia. What happens before the procedure? Staying hydrated Follow instructions from your health care provider about hydration, which may include:  Up to 2 hours before the procedure - you may continue to drink clear liquids, such as  water, clear fruit juice, black coffee, and plain tea. Eating and drinking restrictions Follow instructions from your health care provider about eating and drinking, which may include:  8 hours before the procedure - stop eating heavy meals or foods such as meat, fried foods, or fatty foods.  6 hours before the procedure - stop eating light meals or foods, such as toast or cereal.  6 hours before the procedure - stop drinking milk or drinks that contain milk.  2 hours before the procedure - stop drinking clear liquids. Medicines  Ask your health care provider about: ? Changing or stopping your regular medicines. This is especially important if you are taking diabetes medicines or blood thinners. ? Taking medicines such as aspirin and ibuprofen. These medicines can thin your blood. Do not take these medicines before your procedure if your health care provider instructs you not to.  You may be given antibiotic medicine to help prevent infection. General instructions  You may have blood tests or imaging studies.  Ask your health care provider how your surgical site will be marked or identified.  If you smoke, do not smoke for at least 2 weeks before your procedure or for as long as told by your health care provider.  Let your health care provider know if you develop a cold or any infection before your surgery.  Plan to have someone take you home from the hospital or clinic.  If you will be going home right after the procedure, plan to have someone with you for 24 hours. What happens during the procedure?  To reduce your  risk of infection: ? Your health care team will wash or sanitize their hands. ? Your skin will be washed with soap. ? Hair may be removed from the surgical area.  An IV tube will be inserted into one of your veins.  You will be given one or more of the following: ? A medicine to help you relax (sedative). ? A medicine to numb the area (local anesthetic). ? A  medicine to make you fall asleep (general anesthetic).  Your surgeon will make an incision over the hernia.  The tissues of the hernia will be moved back into place.  The edges of the hernia may be stitched together.  The opening in the abdominal muscles will be closed with stitches (sutures). Or, your surgeon will place a mesh patch made of manmade (synthetic) material over the opening.  The incision will be closed.  A bandage (dressing) may be placed over the incision. The procedure may vary among health care providers and hospitals. What happens after the procedure?  Your blood pressure, heart rate, breathing rate, and blood oxygen level will be monitored until the medicines you were given have worn off.  You may be given medicine for pain.  Do not drive for 24 hours if you received a sedative. This information is not intended to replace advice given to you by your health care provider. Make sure you discuss any questions you have with your health care provider. Document Released: 08/21/2000 Document Revised: 09/15/2015 Document Reviewed: 08/09/2015 Elsevier Interactive Patient Education  2019 [employer]      Laparoscopic Inguinal Hernia Repair, Adult  Laparoscopic inguinal hernia repair is a surgical procedure to repair a small weak spot in the groin muscles that allows fat or intestine from inside the abdomen to bulge out (inguinal hernia). This procedure may be planned, or it may be an emergency procedure. During the procedure, tissue that has bulged out is moved back into place, and the opening in the groin muscles is repaired. This is done through three small incisions in the abdomen. A thin tube with a light and camera on the end (laparoscope) will be used to help perform the procedure. Tell a health care provider about:  Any allergies you have.  All medicines you are taking, including vitamins, herbs, eye drops, creams, and over-the-counter medicines.  Any  problems you or family members have had with anesthetic medicines.  Any blood disorders you have.  Any surgeries you have had.  Any medical conditions you have.  Whether you are pregnant or may be pregnant. What are the risks? Generally, this is a safe procedure. However, problems may occur, including:  Infection.  Bleeding.  Allergic reactions to medicines.  Damage to other structures or organs.  Long-term pain and swelling of the scrotum, in men.  Testicle damage in men.  Inability to completely empty the bladder (urinary retention).  A collection of fluid that builds up under the skin (seroma).  The hernia coming back (recurrence). What happens before the procedure? Medicines  Ask your health care provider about: ? Changing or stopping your regular medicines. This is especially important if you are taking diabetes medicines or blood thinners. ? Taking over-the-counter medicines, vitamins, herbs, and supplements. ? Taking medicines such as aspirin and ibuprofen. These medicines can thin your blood. Do not take these medicines unless your health care provider tells you to take them.  You may be given antibiotic medicine to help prevent an infection. Staying hydrated Follow instructions from your health care  provider about hydration, which may include:  Up to 2 hours before the procedure - you may continue to drink clear liquids, such as water, clear fruit juice, black coffee, and plain tea. Eating and drinking restrictions Follow instructions from your health care provider about eating and drinking, which may include:  8 hours before the procedure - stop eating heavy meals or foods such as meat, fried foods, or fatty foods.  6 hours before the procedure - stop eating light meals or foods, such as toast or cereal.  6 hours before the procedure - stop drinking milk or drinks that contain milk.  2 hours before the procedure - stop drinking clear liquids. General  instructions  Do not use any products that contain nicotine or tobacco, such as cigarettes and e-cigarettes. If you need help quitting, ask your health care provider.  You may be asked to shower with a germ-killing soap.  Plan to have someone take you home from the hospital or clinic.  Plan to have a responsible adult care for you for at least 24 hours after you leave the hospital or clinic. This is important. What happens during the procedure?  To lower your risk of infection: ? Your health care team will wash or sanitize their hands. ? Hair may be removed from the surgical area. ? Your skin will be washed with soap.  An IV will be inserted into one of your veins.  You will be given one or more of the following: ? A medicine to help you relax (sedative). ? A medicine to make you fall asleep (general anesthetic).  Three small incisions will be made in your abdomen.  Your abdomen will be inflated with carbon dioxide gas to make the surgical area easier to see.  A laparoscope and surgical instruments will be inserted through the incisions. The laparoscope will send images of the inside of your abdomen to a monitor in the room.  Tissue that is bulging through the hernia may be removed or moved back into its normal place.  The hernia opening will be closed with a sheet of surgical mesh.  The surgical instruments and laparoscope will be removed.  Your incisions will be closed with stitches (sutures) and adhesive strips.  A bandage (dressing) will be placed over your incisions. The procedure may vary among health care providers and hospitals. What happens after the procedure?  Your blood pressure, heart rate, breathing rate, and blood oxygen level will be monitored until the medicines you were given have worn off.  You will be given pain medicine as needed.  You may continue to receive medicines and fluids through an IV. The IV will be removed after you can drink fluids  well.  You will be encouraged to get up and move around, and to take deep breaths frequently.  Do not drive for 24 hours if you were given a sedative during your procedure. Summary  Laparoscopic inguinal hernia repair is a surgical procedure to repair a small weak spot in the groin muscles that allows fat or intestine from inside the abdomen to bulge out (inguinal hernia).  This procedure is done through three small incisions in the abdomen. A thin tube with a light and camera on the end (laparoscope) will be used to help perform the procedure.  After the procedure, you will be encouraged to get up and move around, and to take deep breaths frequently. This information is not intended to replace advice given to you by your health care provider.  Make sure you discuss any questions you have with your health care provider. Document Released: 06/06/2016 Document Revised: 06/06/2016 Document Reviewed: 06/06/2016 Elsevier Interactive Patient Education  2019 Else[employer] Inc.     Inguinal Hernia, Adult An inguinal hernia is when fat or your intestines push through a weak spot in a muscle where your leg meets your lower belly (groin). This causes a rounded lump (bulge). This kind of hernia could also be:  In your scrotum, if you are male.  In folds of skin around your vagina, if you are male. There are three types of inguinal hernias. These include:  Hernias that can be pushed back into the belly (are reducible). This type rarely causes pain.  Hernias that cannot be pushed back into the belly (are incarcerated).  Hernias that cannot be pushed back into the belly and lose their blood supply (are strangulated). This type needs emergency surgery. If you do not have symptoms, you may not need treatment. If you have symptoms or a large hernia, you may need surgery. Follow these instructions at home: Lifestyle  Do these things if told by your doctor so you do not have trouble pooping  (constipation): ? Drink enough fluid to keep your pee (urine) pale yellow. ? Eat foods that have a lot of fiber. These include fresh fruits and vegetables, whole grains, and beans. ? Limit foods that are high in fat and processed sugars. These include foods that are fried or sweet. ? Take medicine for trouble pooping.  Avoid lifting heavy objects.  Avoid standing for long amounts of time.  Do not use any products that contain nicotine or tobacco. These include cigarettes and e-cigarettes. If you need help quitting, ask your doctor.  Stay at a healthy weight. General instructions  You may try to push your hernia in by very gently pressing on it when you are lying down. Do not try to force the bulge back in if it will not push in easily.  Watch your hernia for any changes in shape, size, or color. Tell your doctor if you see any changes.  Take over-the-counter and prescription medicines only as told by your doctor.  Keep all follow-up visits as told by your doctor. This is important. Contact a doctor if:  You have a fever.  You have new symptoms.  Your symptoms get worse. Get help right away if:  The area where your leg meets your lower belly has: ? Pain that gets worse suddenly. ? A bulge that gets bigger suddenly, and it does not get smaller after that. ? A bulge that turns red or purple. ? A bulge that is painful when you touch it.  You are a man, and your scrotum: ? Suddenly feels painful. ? Suddenly changes in size.  You cannot push the hernia in by very gently pressing on it when you are lying down. Do not try to force the bulge back in if it will not push in easily.  You feel sick to your stomach (nauseous), and that feeling does not go away.  You throw up (vomit), and that keeps happening.  You have a fast heartbeat.  You cannot poop (have a bowel movement) or pass gas. These symptoms may be an emergency. Do not wait to see if the symptoms will go away. Get  medical help right away. Call your local emergency services (911 in the [place] Summary  An inguinal hernia is when fat or your intestines push through a weak spot in a muscle  where your leg meets your lower belly (groin). This causes a rounded lump (bulge).  If you do not have symptoms, you may not need treatment. If you have symptoms or a large hernia, you may need surgery.  Avoid lifting heavy objects. Also avoid standing for long amounts of time.  Do not try to force the bulge back in if it will not push in easily. This information is not intended to replace advice given to you by your health care provider. Make sure you discuss any questions you have with your health care provider. Document Released: 03/28/2006 Document Revised: 03/29/2017 Document Reviewed: 11/27/2016 Elsevier Interactive Patient Education  2019 [employer].

## 2018-05-08 NOTE — Progress Notes (Signed)
05/08/2018  Reason for Visit:  Bilateral inguinal hernia  Referring Provider:  Jenna Luo, MD  History of Present Illness: Grant [name] is a 63 y.o. male presenting for evaluation of bilateral inguinal hernias.  He reports that about 3 months ago he started having left lower abdominal discomfort.  He reports the episodes of discomfort were very significant and associated with a bulging sensation on the left groin.  When the bulging would subside, the pain would also go away.  The pain was never associated with any nausea, vomiting, constipation, or other bowel changes.  He has had workup with included a CT scan which I have independently viewed, and shows that he has bilateral fat containing inguinal hernias.  The patient reports that even before 3 months ago, he thinks he has noticed the bulging happening intermittently, but he did not have any symptoms from it and never sought medical attention for it.  He's not sure how long the bulging has been happening for.  He also reports noticing bulging on the right groin, but that area has remained asymptomatic throughout.  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  He also has a pinched nerve on his left arm and is trying to schedule surgery for that too.  Past Medical History: Past Medical History:  Diagnosis Date  . Acid reflux   . Allergy   . Anxiety   . Hypertension   . Insomnia   . Sensorineural hearing loss      Past Surgical History: Past Surgical History:  Procedure Laterality Date  . INCISION AND DRAINAGE Right 02/04/2013   Procedure: INCISION AND DRAINAGE;  Surgeon: Linna Hoff, MD;  Location: [place];  Service: Orthopedics;  Laterality: Right;  . OPEN REDUCTION INTERNAL FIXATION (ORIF) FINGER WITH RADIAL BONE GRAFT Right 02/04/2013   Procedure: OPEN REDUCTION INTERNAL FIXATION (ORIF) right ring and small fingers;  Surgeon: Linna Hoff, MD;  Location: [place];  Service: Orthopedics;   Laterality: Right;    Home Medications: Prior to Admission medications   Medication Sig Start Date End Date Taking? Authorizing Provider  losartan (COZAAR) 50 MG tablet Take 1 tablet (50 mg total) by mouth daily. 11/28/17  Yes Susy Frizzle, MD  pantoprazole (PROTONIX) 40 MG tablet Take 1 tablet (40 mg total) by mouth daily. 09/15/17  Yes Susy Frizzle, MD  rOPINIRole (REQUIP) 0.25 MG tablet Take 4 tablets (1 mg total) by mouth 2 (two) times daily. 4 pills up to 2 times a day. Patient taking differently: Take 1 mg by mouth 2 (two) times daily. 2 pills up to 4 times a day. 11/27/17  Yes Dennie Bible, NP  EPINEPHrine 0.3 mg/0.3 mL IJ SOAJ injection Inject 0.3 mLs (0.3 mg total) into the muscle once for 1 dose. 11/17/17 11/17/17  Susy Frizzle, MD    Allergies: Allergies  Allergen Reactions  . Asa [Aspirin] Other (See Comments)    Gi upset  . Bee Venom Swelling and Rash    Social History:  reports that he has quit smoking. He has never used smokeless tobacco. He reports current drug use. Drug: Marijuana. He reports that he does not drink alcohol.   Family History: Family History  Problem Relation Age of Onset  . Hyperlipidemia Mother   . Hyperlipidemia Father     Review of Systems: Review of Systems  Constitutional: Negative for chills and fever.  HENT: Negative for hearing loss.   Respiratory:  Negative for shortness of breath.   Cardiovascular: Negative for chest pain.  Gastrointestinal: Positive for abdominal pain. Negative for constipation, diarrhea, nausea and vomiting.  Genitourinary: Negative for dysuria.  Musculoskeletal: Negative for myalgias.  Skin: Negative for rash.  Neurological: Negative for dizziness.  Psychiatric/Behavioral: Negative for depression.    Physical Exam BP (!) 144/74   Pulse 71   Temp 98.1 F (36.7 C) (Temporal)   Ht 5\' 9"  (1.753 m)   Wt 176 lb 12.8 oz (80.2 kg)   SpO2 98%   BMI 26.11 kg/m  CONSTITUTIONAL: No acute  distress HEENT:  Normocephalic, atraumatic, extraocular motion intact. NECK: Trachea is midline, and there is no jugular venous distension.  RESPIRATORY:  Lungs are clear, and breath sounds are equal bilaterally. Normal respiratory effort without pathologic use of accessory muscles. CARDIOVASCULAR: Heart is regular without murmurs, gallops, or rubs. GI: The abdomen is soft, non-distended, non-tender to palpation.  The patient has easily reducible bilateral inguinal hernias.  They reduced as soon as the patient laid back on the exam table.  They do bulge when he coughs or strains.  No evidence of incarceration, skin color changes, or infection.  MUSCULOSKELETAL:  Normal muscle strength and tone in all four extremities.  No peripheral edema or cyanosis. SKIN: Skin turgor is normal. There are no pathologic skin lesions.  NEUROLOGIC:  Motor and sensation is grossly normal.  Cranial nerves are grossly intact. PSYCH:  Alert and oriented to person, place and time. Affect is normal.  Laboratory Analysis: No results found for this or any previous visit (from the past 24 hour(s)).  Imaging: CT scan abd/pelvis 04/28/18: IMPRESSION: 1. No evident bowel obstruction or bowel wall thickening. No abscess in the abdomen or pelvis. Appendix appears normal.  2.  No evident renal or ureteral calculus.  No hydronephrosis.  3. Multiple prostatic calculi. There is loss of fat plane between the inferior bladder in the prostate. Advise clinical assessment of the prostate and correlation with PSA in this regard.  4. Focal hiatal hernia. Inguinal hernias bilaterally containing only fat.  5.  Aortic atherosclerosis.   Assessment and Plan: This is a 63 y.o. male with bilateral inguinal hernias, left side symptomatic and right side asymptomatic.  Discussed with the patient that we have options for his management.  He is definitely interested in surgical repair as the left side is symptomatic and he wants to be  able to do regular activities without discomfort and work without discomfort.  He would like both repaired as he does not want to risk symptoms developing on the right side in the future either.  Discussed with him the role of open vs laparoscopic bilateral inguinal hernia repair.  Discussed the recovery time, post-op weight restrictions, risks of bleeding, infection, and injury to surrounding structures.  He does not have a specific preference for approach.  Discussed with him that we can do this via laparoscopy and if there is too much scarring, then we proceed with open approach.  He has not had any abdominal surgeries, but it is unclear the chronicity of his hernias.  He is willing to proceed with this plan.  We will schedule him for 05/13/18.  Face-to-face time spent with the patient and care providers was 60 minutes, with more than 50% of the time spent counseling, educating, and coordinating care of the patient.     Melvyn Neth, [facility]

## 2018-05-08 NOTE — H&P (View-Only) (Signed)
05/08/2018  Reason for Visit:  Bilateral inguinal hernia  Referring Provider:  Jenna Luo, MD  History of Present Illness: Grant [name] is a 63 y.o. male presenting for evaluation of bilateral inguinal hernias.  He reports that about 3 months ago he started having left lower abdominal discomfort.  He reports the episodes of discomfort were very significant and associated with a bulging sensation on the left groin.  When the bulging would subside, the pain would also go away.  The pain was never associated with any nausea, vomiting, constipation, or other bowel changes.  He has had workup with included a CT scan which I have independently viewed, and shows that he has bilateral fat containing inguinal hernias.  The patient reports that even before 3 months ago, he thinks he has noticed the bulging happening intermittently, but he did not have any symptoms from it and never sought medical attention for it.  He's not sure how long the bulging has been happening for.  He also reports noticing bulging on the right groin, but that area has remained asymptomatic throughout.  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  He also has a pinched nerve on his left arm and is trying to schedule surgery for that too.  Past Medical History: Past Medical History:  Diagnosis Date  . Acid reflux   . Allergy   . Anxiety   . Hypertension   . Insomnia   . Sensorineural hearing loss      Past Surgical History: Past Surgical History:  Procedure Laterality Date  . INCISION AND DRAINAGE Right 02/04/2013   Procedure: INCISION AND DRAINAGE;  Surgeon: Linna Hoff, MD;  Location: [facility];  Service: Orthopedics;  Laterality: Right;  . OPEN REDUCTION INTERNAL FIXATION (ORIF) FINGER WITH RADIAL BONE GRAFT Right 02/04/2013   Procedure: OPEN REDUCTION INTERNAL FIXATION (ORIF) right ring and small fingers;  Surgeon: Linna Hoff, MD;  Location: [facility];  Service: Orthopedics;   Laterality: Right;    Home Medications: Prior to Admission medications   Medication Sig Start Date End Date Taking? Authorizing Provider  losartan (COZAAR) 50 MG tablet Take 1 tablet (50 mg total) by mouth daily. 11/28/17  Yes Susy Frizzle, MD  pantoprazole (PROTONIX) 40 MG tablet Take 1 tablet (40 mg total) by mouth daily. 09/15/17  Yes Susy Frizzle, MD  rOPINIRole (REQUIP) 0.25 MG tablet Take 4 tablets (1 mg total) by mouth 2 (two) times daily. 4 pills up to 2 times a day. Patient taking differently: Take 1 mg by mouth 2 (two) times daily. 2 pills up to 4 times a day. 11/27/17  Yes Dennie Bible, NP  EPINEPHrine 0.3 mg/0.3 mL IJ SOAJ injection Inject 0.3 mLs (0.3 mg total) into the muscle once for 1 dose. 11/17/17 11/17/17  Susy Frizzle, MD    Allergies: Allergies  Allergen Reactions  . Asa [Aspirin] Other (See Comments)    Gi upset  . Bee Venom Swelling and Rash    Social History:  reports that he has quit smoking. He has never used smokeless tobacco. He reports current drug use. Drug: Marijuana. He reports that he does not drink alcohol.   Family History: Family History  Problem Relation Age of Onset  . Hyperlipidemia Mother   . Hyperlipidemia Father     Review of Systems: Review of Systems  Constitutional: Negative for chills and fever.  HENT: Negative for hearing loss.   Respiratory:  Negative for shortness of breath.   Cardiovascular: Negative for chest pain.  Gastrointestinal: Positive for abdominal pain. Negative for constipation, diarrhea, nausea and vomiting.  Genitourinary: Negative for dysuria.  Musculoskeletal: Negative for myalgias.  Skin: Negative for rash.  Neurological: Negative for dizziness.  Psychiatric/Behavioral: Negative for depression.    Physical Exam BP (!) 144/74   Pulse 71   Temp 98.1 F (36.7 C) (Temporal)   Ht 5\' 9"  (1.753 m)   Wt 176 lb 12.8 oz (80.2 kg)   SpO2 98%   BMI 26.11 kg/m  CONSTITUTIONAL: No acute  distress HEENT:  Normocephalic, atraumatic, extraocular motion intact. NECK: Trachea is midline, and there is no jugular venous distension.  RESPIRATORY:  Lungs are clear, and breath sounds are equal bilaterally. Normal respiratory effort without pathologic use of accessory muscles. CARDIOVASCULAR: Heart is regular without murmurs, gallops, or rubs. GI: The abdomen is soft, non-distended, non-tender to palpation.  The patient has easily reducible bilateral inguinal hernias.  They reduced as soon as the patient laid back on the exam table.  They do bulge when he coughs or strains.  No evidence of incarceration, skin color changes, or infection.  MUSCULOSKELETAL:  Normal muscle strength and tone in all four extremities.  No peripheral edema or cyanosis. SKIN: Skin turgor is normal. There are no pathologic skin lesions.  NEUROLOGIC:  Motor and sensation is grossly normal.  Cranial nerves are grossly intact. PSYCH:  Alert and oriented to person, place and time. Affect is normal.  Laboratory Analysis: No results found for this or any previous visit (from the past 24 hour(s)).  Imaging: CT scan abd/pelvis 04/28/18: IMPRESSION: 1. No evident bowel obstruction or bowel wall thickening. No abscess in the abdomen or pelvis. Appendix appears normal.  2.  No evident renal or ureteral calculus.  No hydronephrosis.  3. Multiple prostatic calculi. There is loss of fat plane between the inferior bladder in the prostate. Advise clinical assessment of the prostate and correlation with PSA in this regard.  4. Focal hiatal hernia. Inguinal hernias bilaterally containing only fat.  5.  Aortic atherosclerosis.   Assessment and Plan: This is a 63 y.o. male with bilateral inguinal hernias, left side symptomatic and right side asymptomatic.  Discussed with the patient that we have options for his management.  He is definitely interested in surgical repair as the left side is symptomatic and he wants to be  able to do regular activities without discomfort and work without discomfort.  He would like both repaired as he does not want to risk symptoms developing on the right side in the future either.  Discussed with him the role of open vs laparoscopic bilateral inguinal hernia repair.  Discussed the recovery time, post-op weight restrictions, risks of bleeding, infection, and injury to surrounding structures.  He does not have a specific preference for approach.  Discussed with him that we can do this via laparoscopy and if there is too much scarring, then we proceed with open approach.  He has not had any abdominal surgeries, but it is unclear the chronicity of his hernias.  He is willing to proceed with this plan.  We will schedule him for 05/13/18.  Face-to-face time spent with the patient and care providers was 60 minutes, with more than 50% of the time spent counseling, educating, and coordinating care of the patient.     Melvyn Neth, [facility]

## 2018-05-11 ENCOUNTER — Other Ambulatory Visit: Payer: Self-pay

## 2018-05-11 ENCOUNTER — Encounter
Admission: RE | Admit: 2018-05-11 | Discharge: 2018-05-11 | Disposition: A | Discharge status: Home / Self Care | Source: Ambulatory Visit | Attending: Surgery | Admitting: Surgery

## 2018-05-11 DIAGNOSIS — Z87891 Personal history of nicotine dependence: Secondary | ICD-10-CM | POA: Diagnosis not present

## 2018-05-11 DIAGNOSIS — Z79899 Other long term (current) drug therapy: Secondary | ICD-10-CM | POA: Diagnosis not present

## 2018-05-11 DIAGNOSIS — G4700 Insomnia, unspecified: Secondary | ICD-10-CM | POA: Diagnosis not present

## 2018-05-11 DIAGNOSIS — I10 Essential (primary) hypertension: Secondary | ICD-10-CM

## 2018-05-11 DIAGNOSIS — H905 Unspecified sensorineural hearing loss: Secondary | ICD-10-CM | POA: Diagnosis not present

## 2018-05-11 DIAGNOSIS — R001 Bradycardia, unspecified: Secondary | ICD-10-CM

## 2018-05-11 DIAGNOSIS — F419 Anxiety disorder, unspecified: Secondary | ICD-10-CM | POA: Diagnosis not present

## 2018-05-11 DIAGNOSIS — Z01818 Encounter for other preprocedural examination: Secondary | ICD-10-CM

## 2018-05-11 DIAGNOSIS — K219 Gastro-esophageal reflux disease without esophagitis: Secondary | ICD-10-CM | POA: Diagnosis not present

## 2018-05-11 DIAGNOSIS — I700 Atherosclerosis of aorta: Secondary | ICD-10-CM | POA: Diagnosis not present

## 2018-05-11 DIAGNOSIS — K4020 Bilateral inguinal hernia, without obstruction or gangrene, not specified as recurrent: Secondary | ICD-10-CM | POA: Diagnosis not present

## 2018-05-11 HISTORY — DX: Von Willebrand disease, unspecified: D68.00

## 2018-05-11 HISTORY — DX: Anemia, unspecified: D64.9

## 2018-05-11 HISTORY — DX: Personal history of other diseases of the digestive system: Z87.19

## 2018-05-11 HISTORY — DX: Von Willebrand's disease: D68.0

## 2018-05-11 NOTE — Patient Instructions (Signed)
Your procedure is scheduled on: 05-13-18 [facility] Report to Same Day Surgery 2nd floor medical mall [facility] Entrance-take elevator on left to 2nd floor.  Check in with surgery information desk.) To find out your arrival time please call [phone] between 1PM - 3PM on 05-12-18 TUESDAY  Remember: Instructions that are not followed completely may result in serious medical risk, up to and including death, or upon the discretion of your surgeon and anesthesiologist your surgery may need to be rescheduled.    _x___ 1. Do not eat food after midnight the night before your procedure. NO GUM OR CANDY AFTER MIDNIGHT.  You may drink clear liquids up to 2 hours before you are scheduled to arrive at the hospital for your procedure.  Do not drink clear liquids within 2 hours of your scheduled arrival to the hospital.  Clear liquids include  --Water or Apple juice without pulp  --Clear carbohydrate beverage such as ClearFast or Gatorade  --Black Coffee or Clear Tea (No milk, no creamers, do not add anything to the coffee or Te   ____Ensure clear carbohydrate drink on the way to the hospital for bariatric patients  ____Ensure clear carbohydrate drink 3 hours before surgery for Dr Dwyane Luo patients if physician instructed.      __x__ 2. No Alcohol for 24 hours before or after surgery.   __x__3. No Smoking or e-cigarettes for 24 prior to surgery.  Do not use any chewable tobacco products for at least 6 hour prior to surgery   ____  4. Bring all medications with you on the day of surgery if instructed.    __x__ 5. Notify your doctor if there is any change in your medical condition     (cold, fever, infections).    x___6. On the morning of surgery brush your teeth with toothpaste and water.  You may rinse your mouth with mouth wash if you wish.  Do not swallow any toothpaste or mouthwash.   Do not wear jewelry, make-up, hairpins, clips or nail polish.  Do not wear lotions, powders, or perfumes.  You may wear deodorant.  Do not shave 48 hours prior to surgery. Men may shave face and neck.  Do not bring valuables to the hospital.    [facility] is not responsible for any belongings or valuables.               Contacts, dentures or bridgework may not be worn into surgery.  Leave your suitcase in the car. After surgery it may be brought to your room.  For patients admitted to the hospital, discharge time is determined by your treatment team.  _  Patients discharged the day of surgery will not be allowed to drive home.  You will need someone to drive you home and stay with you the night of your procedure.    Please read over the following fact sheets that you were given:   [facility] Preparing for Surgery    _x___ TAKE THE FOLLOWING MEDICATION THE MORNING OF SURGERY WITH A SMALL SIP OF WATER. These include:  1. REQUIP  2. PROTONIX  3. TAKE AN EXTRA PROTONIX THE NIGHT BEFORE YOUR SURGERY  4.  5.  6.  ____Fleets enema or Magnesium Citrate as directed.   _x___ Use CHG Soap or sage wipes as directed on instruction sheet   ____ Use inhalers on the day of surgery and bring to hospital day of surgery  ____ Stop Metformin and Janumet 2 days prior to surgery.  ____ Take 1/2 of usual insulin dose the night before surgery and none on the morning surgery.   ____ Follow recommendations from Cardiologist, Pulmonologist or PCP regarding stopping Aspirin, Coumadin, Plavix ,Eliquis, Effient, or Pradaxa, and Pletal.  X____Stop Anti-inflammatories such as Advil, Aleve, Ibuprofen, Motrin, Naproxen, Naprosyn, Goodies powders or aspirin products NOW-OK to take Tylenol   ____ Stop supplements until after surgery  ____ Bring C-Pap to the hospital.

## 2018-05-12 MED ORDER — CEFAZOLIN SODIUM-DEXTROSE 2-4 GM/100ML-% IV SOLN
2.0000 g | INTRAVENOUS | Status: AC
  Administered 2018-05-13: 2 g via INTRAVENOUS

## 2018-05-13 ENCOUNTER — Encounter: Payer: Self-pay | Admitting: *Deleted

## 2018-05-13 ENCOUNTER — Ambulatory Visit: Admitting: Certified Registered"

## 2018-05-13 ENCOUNTER — Ambulatory Visit
Admission: RE | Admit: 2018-05-13 | Discharge: 2018-05-13 | Disposition: A | Discharge status: Home / Self Care | Attending: Surgery | Admitting: Surgery

## 2018-05-13 ENCOUNTER — Other Ambulatory Visit: Payer: Self-pay

## 2018-05-13 ENCOUNTER — Encounter
Admission: RE | Disposition: A | Discharge status: Home / Self Care | Payer: Self-pay | Source: Home / Self Care | Attending: Surgery

## 2018-05-13 DIAGNOSIS — K4020 Bilateral inguinal hernia, without obstruction or gangrene, not specified as recurrent: Secondary | ICD-10-CM

## 2018-05-13 DIAGNOSIS — I700 Atherosclerosis of aorta: Secondary | ICD-10-CM | POA: Insufficient documentation

## 2018-05-13 DIAGNOSIS — K219 Gastro-esophageal reflux disease without esophagitis: Secondary | ICD-10-CM | POA: Insufficient documentation

## 2018-05-13 DIAGNOSIS — Z79899 Other long term (current) drug therapy: Secondary | ICD-10-CM | POA: Insufficient documentation

## 2018-05-13 DIAGNOSIS — G4700 Insomnia, unspecified: Secondary | ICD-10-CM | POA: Insufficient documentation

## 2018-05-13 DIAGNOSIS — Z87891 Personal history of nicotine dependence: Secondary | ICD-10-CM | POA: Insufficient documentation

## 2018-05-13 DIAGNOSIS — F419 Anxiety disorder, unspecified: Secondary | ICD-10-CM | POA: Insufficient documentation

## 2018-05-13 DIAGNOSIS — I10 Essential (primary) hypertension: Secondary | ICD-10-CM | POA: Insufficient documentation

## 2018-05-13 DIAGNOSIS — H905 Unspecified sensorineural hearing loss: Secondary | ICD-10-CM | POA: Insufficient documentation

## 2018-05-13 HISTORY — PX: INGUINAL HERNIA REPAIR: SHX194

## 2018-05-13 LAB — URINE DRUG SCREEN, QUALITATIVE (ARMC ONLY)
AMPHETAMINES, UR SCREEN: NOT DETECTED
Barbiturates, Ur Screen: NOT DETECTED
Benzodiazepine, Ur Scrn: NOT DETECTED
Cannabinoid 50 Ng, Ur ~~LOC~~: POSITIVE — AB
Cocaine Metabolite,Ur ~~LOC~~: NOT DETECTED
MDMA (Ecstasy)Ur Screen: NOT DETECTED
METHADONE SCREEN, URINE: NOT DETECTED
Opiate, Ur Screen: NOT DETECTED
PHENCYCLIDINE (PCP) UR S: NOT DETECTED
Tricyclic, Ur Screen: NOT DETECTED

## 2018-05-13 SURGERY — REPAIR, HERNIA, INGUINAL, BILATERAL, LAPAROSCOPIC
Anesthesia: General | Laterality: Bilateral

## 2018-05-13 MED ORDER — CHLORHEXIDINE GLUCONATE CLOTH 2 % EX PADS: 6.0000 | MEDICATED_PAD | Freq: Once | CUTANEOUS | Status: DC

## 2018-05-13 MED ORDER — SUGAMMADEX SODIUM 200 MG/2ML IV SOLN
INTRAVENOUS | Status: DC | PRN
  Administered 2018-05-13: 200 mg via INTRAVENOUS

## 2018-05-13 MED ORDER — FENTANYL CITRATE (PF) 100 MCG/2ML IJ SOLN
INTRAMUSCULAR | Status: AC
  Administered 2018-05-13: 25 ug via INTRAVENOUS
  Filled 2018-05-13: qty 2

## 2018-05-13 MED ORDER — ROCURONIUM BROMIDE 50 MG/5ML IV SOLN
INTRAVENOUS | Status: AC
  Filled 2018-05-13: qty 1

## 2018-05-13 MED ORDER — BUPIVACAINE LIPOSOME 1.3 % IJ SUSP
INTRAMUSCULAR | Status: DC | PRN
  Administered 2018-05-13: 15 mL

## 2018-05-13 MED ORDER — OXYCODONE HCL 5 MG PO TABS
ORAL_TABLET | ORAL | Status: AC
  Filled 2018-05-13: qty 1

## 2018-05-13 MED ORDER — BUPIVACAINE LIPOSOME 1.3 % IJ SUSP: 20.0000 mL | Freq: Once | INTRAMUSCULAR | Status: DC

## 2018-05-13 MED ORDER — ACETAMINOPHEN 500 MG PO TABS
ORAL_TABLET | ORAL | Status: AC
  Filled 2018-05-13: qty 2

## 2018-05-13 MED ORDER — GLYCOPYRROLATE 0.2 MG/ML IJ SOLN
INTRAMUSCULAR | Status: AC
  Filled 2018-05-13: qty 1

## 2018-05-13 MED ORDER — LACTATED RINGERS IV SOLN
INTRAVENOUS | Status: DC
  Administered 2018-05-13: 12:00:00 via INTRAVENOUS

## 2018-05-13 MED ORDER — LIDOCAINE HCL (CARDIAC) PF 100 MG/5ML IV SOSY
PREFILLED_SYRINGE | INTRAVENOUS | Status: DC | PRN
  Administered 2018-05-13: 50 mg via INTRAVENOUS

## 2018-05-13 MED ORDER — FENTANYL CITRATE (PF) 100 MCG/2ML IJ SOLN
INTRAMUSCULAR | Status: AC
  Filled 2018-05-13: qty 2

## 2018-05-13 MED ORDER — GLYCOPYRROLATE 0.2 MG/ML IJ SOLN
INTRAMUSCULAR | Status: DC | PRN
  Administered 2018-05-13: 0.2 mg via INTRAVENOUS

## 2018-05-13 MED ORDER — SUCCINYLCHOLINE CHLORIDE 20 MG/ML IJ SOLN
INTRAMUSCULAR | Status: AC
  Filled 2018-05-13: qty 1

## 2018-05-13 MED ORDER — DEXMEDETOMIDINE HCL IN NACL 80 MCG/20ML IV SOLN
INTRAVENOUS | Status: AC
  Filled 2018-05-13: qty 20

## 2018-05-13 MED ORDER — ONDANSETRON HCL 4 MG/2ML IJ SOLN: 4.0000 mg | Freq: Once | INTRAMUSCULAR | Status: DC | PRN

## 2018-05-13 MED ORDER — PROPOFOL 10 MG/ML IV BOLUS
INTRAVENOUS | Status: DC | PRN
  Administered 2018-05-13: 150 mg via INTRAVENOUS

## 2018-05-13 MED ORDER — MIDAZOLAM HCL 2 MG/2ML IJ SOLN
INTRAMUSCULAR | Status: AC
  Filled 2018-05-13: qty 2

## 2018-05-13 MED ORDER — BUPIVACAINE-EPINEPHRINE 0.25% -1:200000 IJ SOLN
INTRAMUSCULAR | Status: DC | PRN
  Administered 2018-05-13: 15 mL

## 2018-05-13 MED ORDER — ROCURONIUM BROMIDE 100 MG/10ML IV SOLN
INTRAVENOUS | Status: DC | PRN
  Administered 2018-05-13: 20 mg via INTRAVENOUS
  Administered 2018-05-13: 5 mg via INTRAVENOUS
  Administered 2018-05-13: 10 mg via INTRAVENOUS
  Administered 2018-05-13: 35 mg via INTRAVENOUS

## 2018-05-13 MED ORDER — BUPIVACAINE-EPINEPHRINE (PF) 0.25% -1:200000 IJ SOLN
INTRAMUSCULAR | Status: AC
  Filled 2018-05-13: qty 30

## 2018-05-13 MED ORDER — ACETAMINOPHEN 500 MG PO TABS
1000.0000 mg | ORAL_TABLET | ORAL | Status: AC
  Administered 2018-05-13: 1000 mg via ORAL

## 2018-05-13 MED ORDER — OXYCODONE HCL 5 MG PO TABS: 5.0000 mg | ORAL_TABLET | ORAL | 0 refills | Status: DC | PRN

## 2018-05-13 MED ORDER — IBUPROFEN 600 MG PO TABS: 600.0000 mg | ORAL_TABLET | Freq: Three times a day (TID) | ORAL | 0 refills | Status: DC | PRN

## 2018-05-13 MED ORDER — FENTANYL CITRATE (PF) 100 MCG/2ML IJ SOLN
INTRAMUSCULAR | Status: DC | PRN
  Administered 2018-05-13 (×3): 50 ug via INTRAVENOUS
  Administered 2018-05-13: 25 ug via INTRAVENOUS
  Administered 2018-05-13: 50 ug via INTRAVENOUS
  Administered 2018-05-13: 25 ug via INTRAVENOUS
  Administered 2018-05-13: 50 ug via INTRAVENOUS

## 2018-05-13 MED ORDER — ONDANSETRON HCL 4 MG/2ML IJ SOLN
INTRAMUSCULAR | Status: DC | PRN
  Administered 2018-05-13: 4 mg via INTRAVENOUS

## 2018-05-13 MED ORDER — LIDOCAINE HCL (PF) 2 % IJ SOLN
INTRAMUSCULAR | Status: AC
  Filled 2018-05-13: qty 10

## 2018-05-13 MED ORDER — GABAPENTIN 300 MG PO CAPS
ORAL_CAPSULE | ORAL | Status: AC
  Filled 2018-05-13: qty 1

## 2018-05-13 MED ORDER — DEXMEDETOMIDINE HCL IN NACL 200 MCG/50ML IV SOLN
INTRAVENOUS | Status: DC | PRN
  Administered 2018-05-13: 16 ug via INTRAVENOUS

## 2018-05-13 MED ORDER — CEFAZOLIN SODIUM-DEXTROSE 2-4 GM/100ML-% IV SOLN
INTRAVENOUS | Status: AC
  Filled 2018-05-13: qty 100

## 2018-05-13 MED ORDER — ONDANSETRON HCL 4 MG/2ML IJ SOLN
INTRAMUSCULAR | Status: AC
  Filled 2018-05-13: qty 2

## 2018-05-13 MED ORDER — DEXAMETHASONE SODIUM PHOSPHATE 10 MG/ML IJ SOLN
INTRAMUSCULAR | Status: DC | PRN
  Administered 2018-05-13: 4 mg via INTRAVENOUS

## 2018-05-13 MED ORDER — MIDAZOLAM HCL 2 MG/2ML IJ SOLN
INTRAMUSCULAR | Status: DC | PRN
  Administered 2018-05-13: 2 mg via INTRAVENOUS

## 2018-05-13 MED ORDER — OXYCODONE HCL 5 MG PO TABS
5.0000 mg | ORAL_TABLET | Freq: Once | ORAL | Status: AC
  Administered 2018-05-13: 5 mg via ORAL

## 2018-05-13 MED ORDER — SUCCINYLCHOLINE CHLORIDE 20 MG/ML IJ SOLN
INTRAMUSCULAR | Status: DC | PRN
  Administered 2018-05-13: 100 mg via INTRAVENOUS

## 2018-05-13 MED ORDER — GABAPENTIN 300 MG PO CAPS
300.0000 mg | ORAL_CAPSULE | ORAL | Status: AC
  Administered 2018-05-13: 300 mg via ORAL

## 2018-05-13 MED ORDER — FENTANYL CITRATE (PF) 100 MCG/2ML IJ SOLN
25.0000 ug | INTRAMUSCULAR | Status: DC | PRN
  Administered 2018-05-13 (×4): 25 ug via INTRAVENOUS

## 2018-05-13 MED ORDER — DEXAMETHASONE SODIUM PHOSPHATE 4 MG/ML IJ SOLN
INTRAMUSCULAR | Status: AC
  Filled 2018-05-13: qty 1

## 2018-05-13 MED ORDER — BUPIVACAINE LIPOSOME 1.3 % IJ SUSP
INTRAMUSCULAR | Status: AC
  Filled 2018-05-13: qty 20

## 2018-05-13 SURGICAL SUPPLY — 40 items
"PENCIL ELECTRO HAND CTR " (MISCELLANEOUS) ×1 IMPLANT
ADH SKN CLS APL DERMABOND .7 (GAUZE/BANDAGES/DRESSINGS) ×1
CHLORAPREP W/TINT 26ML (MISCELLANEOUS) ×2 IMPLANT
COVER WAND RF STERILE (DRAPES) ×1 IMPLANT
DERMABOND ADVANCED (GAUZE/BANDAGES/DRESSINGS) ×1
DERMABOND ADVANCED .7 DNX12 (GAUZE/BANDAGES/DRESSINGS) ×1 IMPLANT
ELECT REM PT RETURN 9FT ADLT (ELECTROSURGICAL) ×2
ELECTRODE REM PT RTRN 9FT ADLT (ELECTROSURGICAL) ×1 IMPLANT
GLOVE SURG SYN 7.0 (GLOVE) ×2 IMPLANT
GLOVE SURG SYN 7.0 PF PI (GLOVE) ×1 IMPLANT
GLOVE SURG SYN 7.5  E (GLOVE) ×1
GLOVE SURG SYN 7.5 E (GLOVE) ×1 IMPLANT
GLOVE SURG SYN 7.5 PF PI (GLOVE) ×1 IMPLANT
GOWN STRL REUS W/ TWL LRG LVL3 (GOWN DISPOSABLE) ×2 IMPLANT
GOWN STRL REUS W/TWL LRG LVL3 (GOWN DISPOSABLE) ×4
IRRIGATION STRYKERFLOW (MISCELLANEOUS) IMPLANT
IRRIGATOR STRYKERFLOW (MISCELLANEOUS)
IV NS 1000ML (IV SOLUTION) ×2
IV NS 1000ML BAXH (IV SOLUTION) ×1 IMPLANT
KIT TURNOVER KIT A (KITS) ×2 IMPLANT
LABEL OR SOLS (LABEL) ×2 IMPLANT
MESH 3DMAX 3X5 LT MED (Mesh General) ×1 IMPLANT
MESH 3DMAX 3X5 RT MED (Mesh General) ×1 IMPLANT
NEEDLE HYPO 22GX1.5 SAFETY (NEEDLE) ×2 IMPLANT
NS IRRIG 500ML POUR BTL (IV SOLUTION) ×2 IMPLANT
PACK LAP CHOLECYSTECTOMY (MISCELLANEOUS) ×2 IMPLANT
PENCIL ELECTRO HAND CTR (MISCELLANEOUS) ×2 IMPLANT
SCISSORS METZENBAUM CVD 33 (INSTRUMENTS) ×2 IMPLANT
SET TUBE SMOKE EVAC HIGH FLOW (TUBING) ×2 IMPLANT
SLEEVE ADV FIXATION 5X100MM (TROCAR) ×4 IMPLANT
SUT MNCRL 4-0 (SUTURE) ×2
SUT MNCRL 4-0 27XMFL (SUTURE) ×1
SUT VICRYL 0 AB UR-6 (SUTURE) ×2 IMPLANT
SUT VLOC 90 S/L VL9 GS22 (SUTURE) ×2 IMPLANT
SUTURE MNCRL 4-0 27XMF (SUTURE) ×1 IMPLANT
TACKER 5MM HERNIA 3.5CML NAB (ENDOMECHANICALS) ×2 IMPLANT
TRAY FOLEY MTR SLVR 16FR STAT (SET/KITS/TRAYS/PACK) ×2 IMPLANT
TROCAR BLUNT TIP 12MM OMST12BT (TROCAR) ×2 IMPLANT
TROCAR Z-THREAD OPTICAL 5X100M (TROCAR) ×2 IMPLANT
WATER STERILE IRR 1000ML POUR (IV SOLUTION) ×2 IMPLANT

## 2018-05-13 NOTE — Discharge Instructions (Signed)
AMBULATORY SURGERY  °DISCHARGE INSTRUCTIONS ° ° °1) The drugs that you were given will stay in your system until tomorrow so for the next 24 hours you should not: ° °A) Drive an automobile °B) Make any legal decisions °C) Drink any alcoholic beverage ° ° °2) You may resume regular meals tomorrow.  Today it is better to start with liquids and gradually work up to solid foods. ° °You may eat anything you prefer, but it is better to start with liquids, then soup and crackers, and gradually work up to solid foods. ° ° °3) Please notify your doctor immediately if you have any unusual bleeding, trouble breathing, redness and pain at the surgery site, drainage, fever, or pain not relieved by medication. ° ° ° °4) Additional Instructions: ° ° ° ° ° ° ° °Please contact your physician with any problems or Same Day Surgery at [phone], Monday through Friday 6 am to 4 pm, or [place] at  [place] [facility] number at [phone].AMBULATORY SURGERY  °DISCHARGE INSTRUCTIONS ° ° °5) The drugs that you were given will stay in your system until tomorrow so for the next 24 hours you should not: ° °D) Drive an automobile °E) Make any legal decisions °F) Drink any alcoholic beverage ° ° °6) You may resume regular meals tomorrow.  Today it is better to start with liquids and gradually work up to solid foods. ° °You may eat anything you prefer, but it is better to start with liquids, then soup and crackers, and gradually work up to solid foods. ° ° °7) Please notify your doctor immediately if you have any unusual bleeding, trouble breathing, redness and pain at the surgery site, drainage, fever, or pain not relieved by medication. ° ° ° °8) Additional Instructions: ° ° ° ° ° ° ° °Please contact your physician with any problems or Same Day Surgery at [phone], Monday through Friday 6 am to 4 pm, or [place] at [place] [facility] number at [phone]. °

## 2018-05-13 NOTE — Anesthesia Procedure Notes (Signed)
Procedure Name: Intubation Performed by: Rolla Plate, CRNA Pre-anesthesia Checklist: Patient identified, Patient being monitored, Timeout performed, Emergency Drugs available and Suction available Patient Re-evaluated:Patient Re-evaluated prior to induction Oxygen Delivery Method: Circle system utilized Preoxygenation: Pre-oxygenation with 100% oxygen Induction Type: IV induction Ventilation: Mask ventilation without difficulty Laryngoscope Size: Miller and 2 Grade View: Grade I Tube type: Oral Tube size: 7.5 mm Number of attempts: 1 Airway Equipment and Method: Stylet Placement Confirmation: ETT inserted through vocal cords under direct vision,  positive ETCO2 and breath sounds checked- equal and bilateral Secured at: 22 cm Tube secured with: Tape Dental Injury: Teeth and Oropharynx as per pre-operative assessment

## 2018-05-13 NOTE — Anesthesia Post-op Follow-up Note (Signed)
Anesthesia QCDR form completed.        

## 2018-05-13 NOTE — Brief Op Note (Signed)
05/13/2018  3:58 PM  PATIENT:  Grant [name]  63 y.o. male  PRE-OPERATIVE DIAGNOSIS:  BILATERAL INGUINAL HERNIA  POST-OPERATIVE DIAGNOSIS:  BILATERAL INGUINAL HERNIA  PROCEDURE:  Procedure(s): LAPAROSCOPIC BILATERAL INGUINAL HERNIA REPAIR (Bilateral)  SURGEON:  Surgeon(s) and Role:    * Olean Ree, MD - Primary    * Rosana Hoes Arn Medal, MD - Assisting  ANESTHESIA:   general  EBL:  10 mL   BLOOD ADMINISTERED:none  DRAINS: none   LOCAL MEDICATIONS USED:  BUPIVICAINE   SPECIMEN:  Source of Specimen:  Left cord lipoma, right cord lipoma  DISPOSITION OF SPECIMEN:  PATHOLOGY  COUNTS:  YES  TOURNIQUET:  * No tourniquets in log *  DICTATION: .Dragon Dictation  PLAN OF CARE: Discharge to home after PACU  PATIENT DISPOSITION:  PACU - hemodynamically stable.   Delay start of Pharmacological VTE agent (>24hrs) due to surgical blood loss or risk of bleeding: yes

## 2018-05-13 NOTE — Anesthesia Preprocedure Evaluation (Signed)
Anesthesia Evaluation  Patient identified by MRN, date of birth, ID band Patient awake    Reviewed: Allergy & Precautions, H&P , NPO status , Patient's Chart, lab work & pertinent test results, reviewed documented beta blocker date and time   Airway Mallampati: II  TM Distance: >3 FB Neck ROM: Full    Dental  (+) Dental Advisory Given   Pulmonary former smoker,    breath sounds clear to auscultation       Cardiovascular hypertension, Pt. on home beta blockers and Pt. on medications  Rhythm:Regular Rate:Normal     Neuro/Psych Anxiety    GI/Hepatic Neg liver ROS, hiatal hernia, GERD  Medicated,  Endo/Other  negative endocrine ROS  Renal/GU negative Renal ROS  negative genitourinary   Musculoskeletal negative musculoskeletal ROS (+)   Abdominal   Peds negative pediatric ROS (+)  Hematology  (+) anemia ,   Anesthesia Other Findings   Reproductive/Obstetrics                             Anesthesia Physical  Anesthesia Plan  ASA: II and emergent  Anesthesia Plan: General   Post-op Pain Management:    Induction: Intravenous, Rapid sequence and Cricoid pressure planned  PONV Risk Score and Plan:   Airway Management Planned: Oral ETT  Additional Equipment:   Intra-op Plan:   Post-operative Plan: Extubation in OR  Informed Consent: I have reviewed the patients History and Physical, chart, labs and discussed the procedure including the risks, benefits and alternatives for the proposed anesthesia with the patient or authorized representative who has indicated his/her understanding and acceptance.     Dental advisory given  Plan Discussed with: Surgeon and CRNA  Anesthesia Plan Comments:         Anesthesia Quick Evaluation

## 2018-05-13 NOTE — Transfer of Care (Signed)
Immediate Anesthesia Transfer of Care Note  Patient: Grant [name]  Procedure(s) Performed: LAPAROSCOPIC BILATERAL INGUINAL HERNIA REPAIR (Bilateral )  Patient Location: PACU  Anesthesia Type:General  Level of Consciousness: awake, alert  and oriented  Airway & Oxygen Therapy: Patient Spontanous Breathing and Patient connected to face mask oxygen  Post-op Assessment: Report given to RN and Post -op Vital signs reviewed and stable  Post vital signs: Reviewed and stable  Last Vitals:  Vitals Value Taken Time  BP    Temp 36.4 C 05/13/2018  4:25 PM  Pulse 73 05/13/2018  4:28 PM  Resp 20 05/13/2018  4:28 PM  SpO2 100 % 05/13/2018  4:28 PM  Vitals shown include unvalidated device data.  Last Pain:  Vitals:   05/13/18 1023  TempSrc: Tympanic  PainSc: 0-No pain         Complications: No apparent anesthesia complications

## 2018-05-13 NOTE — Op Note (Signed)
Procedure Date:  05/13/2018  Pre-operative Diagnosis:  Bilateral reducible inguinal hernias  Post-operative Diagnosis:  Bilateral reducible inguinal hernias  Procedure:  Laparoscopic Bilateral Inguinal Hernia Repair  Surgeon:  Melvyn Neth, MD  Assistant:  Tama High, MD.  His assistance was needed for appropriate camera work and exposure as well as closure of wounds given other critical OR cases on-going  Anesthesia:  General endotracheal  Estimated Blood Loss:  10 ml  Specimens:  Right and left cord lipomas  Complications:  None  Indications for Procedure:  This is a 63 y.o. male who presents with bilateral inguinal hernias.  The options of surgery versus observation were reviewed with the patient and/or family. The risks of bleeding, abscess or infection, recurrence of symptoms, potential for an open procedure, injury to surrounding structures, and chronic pain were all discussed with the patient and was willing to proceed.  Description of Procedure: The patient was correctly identified in the preoperative area and brought into the operating room.  The patient was placed supine with VTE prophylaxis in place.  Appropriate time-outs were performed.  Anesthesia was induced and the patient was intubated.  Foley catheter was placed.  Appropriate antibiotics were infused.  The abdomen was prepped and draped in a sterile fashion. An infraumbilical incision was made. A cutdown technique was used to enter the abdominal cavity without injury, and a Hasson trocar was inserted.  Pneumoperitoneum was obtained with appropriate opening pressures.  Two 5-mm ports were placed in the right and left lateral positions under direct visualization.  Both inguinal regions were inspected for hernias and it was confirmed that the patient had bilateral inguinal hernias.  Using electocautery, the peritoneum on the left side was scored from the median umbilical ligament laterally towards the ASIS.  The  peritoneum was dissected off from the abdominal wall exposing the pubic bone and Cooper's ligament, spermatic cord and related structures.  The epigastric and iliac vessels were intact.  The hernia sac and contents were reduced preserving all structures. A cord lipoma was dissected and resected.  A Bard Ventralight 3D mesh was placed via the umbilical port and was tacked medially in place and once laterally, avoiding any nerve structures.  The peritoneum was then closed over using 2-0 V-loc suture.  A small hole in the inferior peritoneum flap was closed with a tack.  Attention was then turned to the patient's right inguinal hernia.  Using electocautery, the peritoneum on the left side was scored from the median umbilical ligament laterally towards the ASIS.  The peritoneum was dissected off from the abdominal wall exposing the pubic bone and Cooper's ligament, spermatic cord and related structures.  The epigastric and iliac vessels were intact.  The hernia sac and contents were reduced preserving all structures. A cord lipoma was dissected and resected.  A Bard Ventralight 3D mesh was placed via the umbilical port and was tacked medially in place and once laterally, avoiding any nerve structures.  The peritoneum was then closed over using 2-0 V-loc suture.   The 5 mm ports were removed under direct visualization and the Hasson trocar was removed.  The fascial opening was closed using 0 vicryl suture.  Exparel combined with 0.25% bupivacaine with epi was infused in all incisions as well as an ileoinguinal block blilaterally.  The umbilical incision was closed with 3-0 Vicryl and 4-0 Monocryl and the lateral incisions were closed with 4-0 Monocryl.  The wounds were cleaned and sealed with DermaBond.  Foley catheter was removed and  the patient was emerged from anesthesia and extubated and brought to the recovery room for further management.  The patient tolerated the procedure well and all counts were correct  at the end of the case.   Melvyn Neth, MD

## 2018-05-13 NOTE — Progress Notes (Signed)
Pt stating he only wants to see his wife that he doesn't feel good. Other visitors asked to stay in waiting area and were informed this was the patients decission

## 2018-05-13 NOTE — Interval H&P Note (Signed)
History and Physical Interval Note:  05/13/2018 11:52 AM  Grant [name]  has presented today for surgery, with the diagnosis of BILATERAL INGUINAL HERNIA  The various methods of treatment have been discussed with the patient and family. After consideration of risks, benefits and other options for treatment, the patient has consented to  Procedure(s): [place] (Bilateral) as a surgical intervention .  The patient's history has been reviewed, patient examined, no change in status, stable for surgery.  I have reviewed the patient's chart and labs.  Questions were answered to the patient's satisfaction.     Sidi Dzikowski

## 2018-05-14 ENCOUNTER — Encounter: Payer: Self-pay | Admitting: Surgery

## 2018-05-15 LAB — SURGICAL PATHOLOGY

## 2018-05-15 NOTE — Anesthesia Postprocedure Evaluation (Signed)
Anesthesia Post Note  Patient: Grant [name]  Procedure(s) Performed: LAPAROSCOPIC BILATERAL INGUINAL HERNIA REPAIR (Bilateral )  Patient location during evaluation: PACU Anesthesia Type: General Level of consciousness: awake and alert Pain management: pain level controlled Vital Signs Assessment: post-procedure vital signs reviewed and stable Respiratory status: spontaneous breathing, nonlabored ventilation and respiratory function stable Cardiovascular status: blood pressure returned to baseline and stable Postop Assessment: no apparent nausea or vomiting Anesthetic complications: no     Last Vitals:  Vitals:   05/13/18 1720 05/13/18 1724  BP:  (!) 172/81  Pulse: (!) 55 (!) 54  Resp: 14 20  Temp:  36.7 C  SpO2: 99% 100%    Last Pain:  Vitals:   05/13/18 1742  TempSrc:   PainSc: [place]

## 2018-05-27 ENCOUNTER — Ambulatory Visit (INDEPENDENT_AMBULATORY_CARE_PROVIDER_SITE_OTHER): Admitting: Surgery

## 2018-05-27 ENCOUNTER — Encounter: Payer: Self-pay | Admitting: Surgery

## 2018-05-27 ENCOUNTER — Encounter: Payer: Self-pay | Admitting: Nurse Practitioner

## 2018-05-27 ENCOUNTER — Other Ambulatory Visit: Payer: Self-pay

## 2018-05-27 VITALS — BP 122/68 | HR 63 | Temp 97.7°F | Resp 16 | Ht 66.0 in | Wt 179.0 lb

## 2018-05-27 DIAGNOSIS — Z09 Encounter for follow-up examination after completed treatment for conditions other than malignant neoplasm: Secondary | ICD-10-CM

## 2018-05-27 DIAGNOSIS — K4020 Bilateral inguinal hernia, without obstruction or gangrene, not specified as recurrent: Secondary | ICD-10-CM

## 2018-05-27 NOTE — Progress Notes (Deleted)
[facility]  PATIENT: Grant [name] DOB: Aug 13, 1955   REASON FOR VISIT: Follow-up for restless legs HISTORY FROM:patient    HISTORY OF PRESENT ILLNESS: Grant [name] is a 63 year old right-handed gentleman with an underlying medical history of hypertension, reflux disease, allergies, and overweight state, who presents for follow up consultation of his restless legs and PLMS. The patient is unaccompanied today. I last saw him on 11/19/2016, at which time he was taking ropinirole 0.25 mg strength at 3 different times. He had recently suffered serious dog bites that became infected and needed treatment for this as an inpatient. I suggested cautious increase in his Requip 0.25 mg strength 2 pills 3 times a day. He was advised regarding augmentation.   Today, 07/15/2017 (all dictated new, as well as above notes, some dictation done in note pad or Word, outside of chart, may appear as copied): He reports feeling about the same, maybe a little better. He takes requip 1 pill at 6 AM (2 pills was too much in AM), 1 to 2 at 4:30 and 2 at BT, which ranges from 10 PM to MN. Sometimes he has symptoms in his arms. Sx of RLS date back to several years ago. He may have tried gabapentin in the past for Back pain, not sure if he tolerated it. He would be willing to retry it.  UPDATE 9/19/2019CM  Grant [name], 63 year old male returns for follow-up with history of restless leg syndrome.  He claims that his restless legs are a little bit worse.  He thinks his Toprol is making his restless legs worse so he has stopped the medication and wants [place] to make a suggestion however in reviewing side effects of Toprol restless legs is not a side effect.  His blood pressure is elevated in the office today at 156/61.  He was encouraged to follow-up with his primary care regarding his blood pressure.  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  His legs are more bothersome today because he had to climb a  ladder.  He was started back on gabapentin after his last visit in May with Dr. Rexene Alberts, however patient states once he got to 300 mg he had suicidal idealizations and he cut it back to 100 mg.  He has not had further suicidal idealzations on that dose.  He currently takes Requip 0.25mg  3 tablets twice a day.  He has a history of anemia in the past but his most recent CBC with hemoglobin of 13.5.  He returns for reevaluation REVIEW OF SYSTEMS: Full 14 system review of systems performed and notable only for those listed, all others are neg:  Constitutional: neg  Cardiovascular: neg Ear/Nose/Throat: neg  Skin: neg Eyes: neg Respiratory: neg Gastroitestinal: neg  Hematology/Lymphatic: neg  Endocrine: neg Musculoskeletal:neg Allergy/Immunology: neg Neurological: neg Psychiatric: neg Sleep : Restless leg syndrome   ALLERGIES: Allergies  Allergen Reactions  . Bee Venom Anaphylaxis, Swelling and Rash  . Asa [Aspirin] Other (See Comments)    Gi upset    HOME MEDICATIONS: Outpatient Medications Prior to Visit  Medication Sig Dispense Refill  . EPINEPHrine 0.3 mg/0.3 mL IJ SOAJ injection     . losartan (COZAAR) 50 MG tablet Take 1 tablet (50 mg total) by mouth daily. (Patient taking differently: Take 50 mg by mouth at bedtime. ) 90 tablet 3  . pantoprazole (PROTONIX) 40 MG tablet Take 1 tablet (40 mg total) by mouth daily. (Patient taking differently: Take 40 mg by mouth every morning. ) 90  tablet 3  . rOPINIRole (REQUIP) 0.25 MG tablet Take 4 tablets (1 mg total) by mouth 2 (two) times daily. 4 pills up to 2 times a day. (Patient taking differently: Take 0.5 mg by mouth 4 (four) times daily. ) 720 tablet 1   No facility-administered medications prior to visit.     PAST MEDICAL HISTORY: Past Medical History:  Diagnosis Date  . Acid reflux   . Allergy   . Anemia   . Anxiety   . History of hiatal hernia   . Hypertension   . Insomnia   . Sensorineural hearing loss   . Von Willebrand  disease ([place])    PT STATES THIS IS MILD AND HAS NEVER HAD TO SEE HEMATOLOGIST-PT DID SAY THAT WHEN [place]GO HE BLED ALOT BUT IT WAS DUE TO NOT FOLLOWING POST OP INSTRUCTIONS     PAST SURGICAL HISTORY: Past Surgical History:  Procedure Laterality Date  . CIRCUMCISION     AS AN ADULT  . FOOT SURGERY Right   . INCISION AND DRAINAGE Right 02/04/2013   Procedure: INCISION AND DRAINAGE;  Surgeon: Linna Hoff, MD;  Location: [place];  Service: Orthopedics;  Laterality: Right;  . INGUINAL HERNIA REPAIR Bilateral 05/13/2018   Procedure: LAPAROSCOPIC BILATERAL INGUINAL HERNIA REPAIR;  Surgeon: Olean Ree, MD;  Location: [facility];  Service: General;  Laterality: Bilateral;  . OPEN REDUCTION INTERNAL FIXATION (ORIF) FINGER WITH RADIAL BONE GRAFT Right 02/04/2013   Procedure: OPEN REDUCTION INTERNAL FIXATION (ORIF) right ring and small fingers;  Surgeon: Linna Hoff, MD;  Location: [place];  Service: Orthopedics;  Laterality: Right;    FAMILY HISTORY: Family History  Problem Relation Age of Onset  . Hyperlipidemia Mother   . Hyperlipidemia Father     SOCIAL HISTORY: Social History   Socioeconomic History  . Marital status: Married    Spouse name: Not on file  . Number of children: 4  . Years of education: 39  . Highest education level: Not on file  Occupational History  . Occupation: Engineer, petroleum  . Financial resource strain: Not on file  . Food insecurity:    Worry: Not on file    Inability: Not on file  . Transportation needs:    Medical: Not on file    Non-medical: Not on file  Tobacco Use  . Smoking status: Former Smoker    Packs/day: 0.50    Types: Cigarettes    Last attempt to quit: 05/10/2016    Years since quitting: 2.0  . Smokeless tobacco: Never Used  Substance and Sexual Activity  . Alcohol use: No  . Drug use: Yes    Types: Marijuana    Comment: QD  . Sexual activity: Not on file  Lifestyle  . Physical  activity:    Days per week: Not on file    Minutes per session: Not on file  . Stress: Not on file  Relationships  . Social connections:    Talks on phone: Not on file    Gets together: Not on file    Attends religious service: Not on file    Active member of club or organization: Not on file    Attends meetings of clubs or organizations: Not on file    Relationship status: Not on file  . Intimate partner violence:    Fear of current or ex partner: Not on file    Emotionally abused: Not on file  Physically abused: Not on file    Forced sexual activity: Not on file  Other Topics Concern  . Not on file  Social History Narrative   Denies caffeine use      PHYSICAL EXAM  There were no vitals filed for this visit. There is no height or weight on file to calculate BMI.  Generalized: Well developed, in no acute distress  Head: normocephalic and atraumatic,. Oropharynx benign  Neck: Supple,  Musculoskeletal: No deformity   Neurological examination   Mentation: Alert oriented to time, place, history taking. Attention span and concentration appropriate. Recent and remote memory intact.  Follows all commands speech and language fluent.   Cranial nerve II-XII: Pupils were equal round reactive to light extraocular movements were full, visual field were full on confrontational test. Facial sensation and strength were normal. hearing was intact to finger rubbing bilaterally. Uvula tongue midline. head turning and shoulder shrug were normal and symmetric.Tongue protrusion into cheek strength was normal. Motor: normal bulk and tone, full strength in the BUE, BLE,  Sensory: normal and symmetric to light touch,  Coordination: finger-nose-finger, heel-to-shin bilaterally, no dysmetria Reflexes: Symmetric upper and lower, plantar responses were flexor bilaterally. Gait and Station: Rising up from seated position without assistance, normal stance,  moderate stride, good arm swing, smooth  turning, able to perform tiptoe, and heel walking without difficulty. Tandem gait is steady  DIAGNOSTIC DATA (LABS, IMAGING, TESTING) - I reviewed patient records, labs, notes, testing and imaging myself where available.  Lab Results  Component Value Date   WBC 4.6 04/03/2018   HGB 12.9 (L) 04/03/2018   HCT 37.8 (L) 04/03/2018   MCV 88.3 04/03/2018   PLT 253 04/03/2018      Component Value Date/Time   NA 141 04/03/2018 1124   K 4.0 04/03/2018 1124   CL 106 04/03/2018 1124   CO2 27 04/03/2018 1124   GLUCOSE 91 04/03/2018 1124   BUN 12 04/03/2018 1124   CREATININE 0.87 04/03/2018 1124   CALCIUM 9.2 04/03/2018 1124   PROT 6.0 (L) 04/03/2018 1124   ALBUMIN 4.0 06/17/2016 0945   AST 22 04/03/2018 1124   ALT 20 04/03/2018 1124   ALKPHOS 92 06/17/2016 0945   BILITOT 0.6 04/03/2018 1124   GFRNONAA 92 04/03/2018 1124   GFRAA 106 04/03/2018 1124   Lab Results  Component Value Date   CHOL 162 09/15/2017   HDL 32 (L) 09/15/2017   LDLCALC 102 (H) 09/15/2017   TRIG 168 (H) 09/15/2017   CHOLHDL 5.1 (H) 09/15/2017   Lab Results  Component Value Date   HGBA1C 5.3 11/16/2013   Lab Results  Component Value Date   STMHDQQI29 798 11/16/2013   Lab Results  Component Value Date   TSH 1.99 05/01/2006      ASSESSMENT AND PLAN Derrien Anschutz Jonesis a very pleasant 63 year old malewith an underlying medical history of hypertension, reflux disease, allergies, and overweight state, who presents for follow up consultation of hisPLMS and RLS. He had no significant sleep disordered breathing during his baseline sleep study from May 2018. He started Requip with gradual titration from 0.25 mg strength once in the evening to currently a total of 1.50 mg daily total dose, divided typically in 2 doses. He is advised to avoid taking a daytime or morning dose because he has had some sedation from it.  He is also on low-dose gabapentin 100 mg at night only higher doses have caused suicidal  idealizations.   Discussed with Dr. Rexene Alberts Increase Requip  to 4 pills twice daily for a total dose of 2mg .  Continue Neurontin 1 pill daily 100 mg Take supplemental iron Follow-up with your primary care regarding your blood pressure medications Follow-up here in 6 months Dennie Bible, [facility], [facility], [facility] [address] [place], [place] [address] [phone]  I reviewed the above note and documentation by the Nurse Practitioner and agree with the history, physical exam, assessment and plan as outlined above. I was immediately available for face-to-face consultation. Star Age, MD, PhD [facility])

## 2018-05-27 NOTE — Progress Notes (Signed)
05/27/2018  HPI: DAQWAN DOUGAL is a 63 y.o. male s/p laparoscopic bilateral inguinal hernia repair on 05/13/18.  He presents for follow up.  Reports that initially he had scrotal swelling from the insufflation but has since resolved.  Reports some soreness on occasion but no significant pain and no hernia recurrence.  Vital signs: BP 122/68   Pulse 63   Temp 97.7 F (36.5 C) (Temporal)   Resp 16   Ht 5\' 6"  (1.676 m)   Wt 179 lb (81.2 kg)   SpO2 98%   BMI 28.89 kg/m    Physical Exam: Constitutional: No acute distress Abdomen:  Soft, non-distended, non-tender.  No scrotal swelling or edema.  Incisions clean, dry, intact without any evidence of infection.  No evidence of hernia recurrence.  Assessment/Plan: This is a 63 y.o. male s/p laparoscopic bilateral inguinal hernia repair.  --Patient healing well without any evidence of recurrence. --Reminded him that he still has two more weeks of no heavy lifting or pushing of no more than 10-15 lbs.  After that may resume normal activities. --Patient may follow up prn   Melvyn Neth, [facility]

## 2018-05-27 NOTE — Patient Instructions (Addendum)

## 2018-05-27 NOTE — Progress Notes (Unsigned)
Patient has been informed to wait the full 6 weeks after surgery which was performed on 03.04.2020 to [place] to ensure he is completely healed, he has been advised not to do anything strenuous until then.

## 2018-05-28 ENCOUNTER — Ambulatory Visit: Admitting: Nurse Practitioner

## 2018-06-01 ENCOUNTER — Encounter: Payer: Self-pay | Admitting: Nurse Practitioner

## 2018-06-01 ENCOUNTER — Other Ambulatory Visit: Payer: Self-pay | Admitting: *Deleted

## 2018-06-01 DIAGNOSIS — G4761 Periodic limb movement disorder: Secondary | ICD-10-CM

## 2018-06-01 MED ORDER — ROPINIROLE HCL 0.25 MG PO TABS: 1.0000 mg | ORAL_TABLET | Freq: Two times a day (BID) | ORAL | 1 refills | Status: DC

## 2018-06-05 NOTE — Telephone Encounter (Signed)
Pt no showed appt 05-28-18.

## 2018-06-07 ENCOUNTER — Encounter: Payer: Self-pay | Admitting: Surgery

## 2018-08-01 ENCOUNTER — Other Ambulatory Visit: Payer: Self-pay | Admitting: Family Medicine

## 2018-08-01 ENCOUNTER — Encounter: Payer: Self-pay | Admitting: Family Medicine

## 2018-08-04 MED ORDER — PANTOPRAZOLE SODIUM 40 MG PO TBEC: 40.0000 mg | DELAYED_RELEASE_TABLET | Freq: Every day | ORAL | 3 refills | Status: DC

## 2018-08-07 ENCOUNTER — Encounter: Payer: Self-pay | Admitting: Family Medicine

## 2018-08-09 ENCOUNTER — Encounter: Payer: Self-pay | Admitting: Family Medicine

## 2018-08-10 ENCOUNTER — Other Ambulatory Visit: Payer: Self-pay

## 2018-08-10 MED ORDER — LOSARTAN POTASSIUM 50 MG PO TABS: 50.0000 mg | ORAL_TABLET | Freq: Every day | ORAL | 0 refills | Status: DC

## 2018-09-09 DEATH — deceased

## 2018-09-10 ENCOUNTER — Encounter: Payer: Self-pay | Admitting: Family Medicine

## 2018-09-10 ENCOUNTER — Other Ambulatory Visit: Payer: Self-pay | Admitting: Family Medicine

## 2018-09-10 MED ORDER — PREDNISONE 20 MG PO TABS: ORAL_TABLET | ORAL | 0 refills | Status: DC

## 2018-09-14 ENCOUNTER — Other Ambulatory Visit: Payer: Self-pay | Admitting: Family Medicine

## 2018-09-15 ENCOUNTER — Encounter: Payer: Self-pay | Admitting: Family Medicine

## 2018-09-15 MED ORDER — LOSARTAN POTASSIUM 50 MG PO TABS: 50.0000 mg | ORAL_TABLET | Freq: Every day | ORAL | 3 refills | Status: DC

## 2018-11-02 ENCOUNTER — Other Ambulatory Visit: Payer: Self-pay

## 2018-11-02 DIAGNOSIS — G4761 Periodic limb movement disorder: Secondary | ICD-10-CM

## 2018-11-02 MED ORDER — ROPINIROLE HCL 0.25 MG PO TABS: 1.0000 mg | ORAL_TABLET | Freq: Two times a day (BID) | ORAL | 1 refills | Status: DC

## 2018-11-02 NOTE — Telephone Encounter (Signed)
Received a fax from [facility] [place] and the patient is requesting a refill on rOPINIRole (REQUIP) 0.25 MG tablet

## 2018-11-02 NOTE — Telephone Encounter (Signed)
Rx for requip renewed, thank you.

## 2018-11-23 ENCOUNTER — Encounter: Payer: Self-pay | Admitting: Family Medicine

## 2018-11-24 ENCOUNTER — Encounter: Payer: Self-pay | Admitting: Family Medicine

## 2018-11-27 ENCOUNTER — Other Ambulatory Visit: Payer: Self-pay

## 2018-11-30 ENCOUNTER — Encounter: Payer: Self-pay | Admitting: Family Medicine

## 2018-11-30 ENCOUNTER — Other Ambulatory Visit: Payer: Self-pay

## 2018-11-30 ENCOUNTER — Ambulatory Visit (INDEPENDENT_AMBULATORY_CARE_PROVIDER_SITE_OTHER): Admitting: Family Medicine

## 2018-11-30 VITALS — BP 160/84 | HR 68 | Temp 98.5°F | Resp 14 | Ht 69.0 in | Wt 174.0 lb

## 2018-11-30 DIAGNOSIS — K439 Ventral hernia without obstruction or gangrene: Secondary | ICD-10-CM | POA: Diagnosis not present

## 2018-11-30 DIAGNOSIS — D239 Other benign neoplasm of skin, unspecified: Secondary | ICD-10-CM | POA: Diagnosis not present

## 2018-11-30 DIAGNOSIS — B070 Plantar wart: Secondary | ICD-10-CM | POA: Diagnosis not present

## 2018-11-30 NOTE — Progress Notes (Signed)
Subjective:    Patient ID: Grant [name], male    DOB: [birth year], 63 y.o.   MRN: [id number] 04/03/18 Patient states that symptoms began approximately 2 to 3 weeks ago.  He developed sudden sharp left lower quadrant abdominal pain.  He went to the bathroom and had a "blowout diarrhea".  Since that time over the last 2 weeks, he has had recurrent episodes of sharp extremely painful left lower quadrant abdominal pain and diarrhea.  However the confusing finding is that it seems to come and go.  He states that he will be okay for a day or so and then have another attack.  He is extremely tender to palpation in the left lower quadrant today.  He has voluntary guarding in this area.  He also has extremely hyperactive bowel sounds.  He has no rebound tenderness.  The abdomen is soft and nondistended.  Review of his past records show in 2005 a CAT scan showing colitis near the cecum presumed infectious first inflammatory origin.  Patient does not recollect that.  He has no family history of inflammatory bowel disease.  At that time, my plan was: Clinically the patient has colitis.  Differential diagnosis includes infectious colitis such as diverticulitis versus inflammatory bowel disease such as ulcerative colitis versus some type of food allergy versus irritable bowel syndrome.  I will treat the patient empirically for infectious colitis with Cipro 500 mg p.o. twice daily for 10 days and Flagyl 500 mg p.o. twice daily for 10 days.  I will screen the patient for celiac disease.  If symptoms worsen, he needs imaging of the abdomen and pelvis as well as a GI consultation after imaging to evaluate for inflammatory bowel disease with a colonoscopy.  05/01/18 CT scan revealed:  IMPRESSION: 1. No evident bowel obstruction or bowel wall thickening. No abscess in the abdomen or pelvis. Appendix appears normal.  2.  No evident renal or ureteral calculus.  No hydronephrosis.  3. Multiple prostatic calculi. There  is loss of fat plane between the inferior bladder in the prostate. Advise clinical assessment of the prostate and correlation with PSA in this regard.  4. Focal hiatal hernia. Inguinal hernias bilaterally containing only fat.  5.  Aortic atherosclerosis.  Patient states that he continues to have pain in the left lower quadrant.  However now the pain is more in the left inguinal canal area.  He states that he has a pain with prolonged standing.  He has a pain occasionally with Valsalva due to coughing.  Occasionally he will notice some swelling in the inguinal canal that improves if he lays down.  The pain sounds like it may be more due to to the inguinal hernias that were seen particular on the left side.  However he is also overdue for colonoscopy and would like to see GI for screening colonoscopy.  It is been more than 6 months since his last PSA.  He denies any lower urinary tract symptoms.  He denies any dysuria or hematuria  11/30/18  Had surgery in 3/20 for bilateral inguinal hernias.  Patient presents today with 3 separate concerns.  First he has 2 skin colored papules on the right side of his neck.  One under the angle of the mandible.  One slightly anterior and inferior to this near the cricoid cartilage.  The one near the angle of the mandible is approximately 10 mm in diameter.  The one near the cricoid cartilage is approximately 8 mm in diameter.  Both are fleshy colored nodular lesions that have been present for many many years.  Recently the patient states that they are itching.  However they are not erythematous.  There is no scale.  There is no cancerous features.  They appear to be benign dermal moles.  Patient also reports pain in his right inguinal canal.  This occurred 2 weeks ago after lifting something heavy.  On examination, the patient has a direct ventral hernia just above the inguinal canal lateral to the umbilicus and just above the pubic hair.  The lesion grows with Valsalva.   It is easily reproducible but consistent with a hernia.  Third issue is a "corn" over the lateral aspect of his fifth toe just distal to the MTP joint.  On examination there is a 5 mm papule with capillary hemorrhages and absent dermal ridges.  It appears to be a plantars wart. Past Medical History:  Diagnosis Date   Acid reflux    Allergy    Anemia    Anxiety    History of hiatal hernia    Hypertension    Insomnia    Sensorineural hearing loss    Von Willebrand disease ([place])    PT STATES THIS IS MILD AND HAS NEVER HAD TO SEE HEMATOLOGIST-PT DID SAY THAT WHEN [place] HE BLED ALOT BUT IT WAS DUE TO NOT FOLLOWING POST OP INSTRUCTIONS    Past Surgical History:  Procedure Laterality Date   CIRCUMCISION     AS AN ADULT   FOOT SURGERY Right    INCISION AND DRAINAGE Right 02/04/2013   Procedure: INCISION AND DRAINAGE;  Surgeon: Linna Hoff, MD;  Location: [facility];  Service: Orthopedics;  Laterality: Right;   INGUINAL HERNIA REPAIR Bilateral 05/13/2018   Procedure: LAPAROSCOPIC BILATERAL INGUINAL HERNIA REPAIR;  Surgeon: Olean Ree, MD;  Location: [facility];  Service: General;  Laterality: Bilateral;   OPEN REDUCTION INTERNAL FIXATION (ORIF) FINGER WITH RADIAL BONE GRAFT Right 02/04/2013   Procedure: OPEN REDUCTION INTERNAL FIXATION (ORIF) right ring and small fingers;  Surgeon: Linna Hoff, MD;  Location: [facility];  Service: Orthopedics;  Laterality: Right;   Current Outpatient Medications on File Prior to Visit  Medication Sig Dispense Refill   EPINEPHrine 0.3 mg/0.3 mL IJ SOAJ injection      losartan (COZAAR) 50 MG tablet Take 1 tablet (50 mg total) by mouth daily. 90 tablet 3   pantoprazole (PROTONIX) 40 MG tablet TAKE 1 TABLET DAILY 90 tablet 3   pantoprazole (PROTONIX) 40 MG tablet Take 1 tablet (40 mg total) by mouth daily. 90 tablet 3   predniSONE (DELTASONE) 20 MG tablet 3 tabs po qd x 2 days, 2 tabs po qd x 2 days, 1 tab po qd x  2 days 12 tablet 0   rOPINIRole (REQUIP) 0.25 MG tablet Take 4 tablets (1 mg total) by mouth 2 (two) times daily. 4 pills up to 2 times a day. 720 tablet 1   No current facility-administered medications on file prior to visit.    Allergies  Allergen Reactions   Bee Venom Anaphylaxis, Swelling and Rash   Asa [Aspirin] Other (See Comments)    Gi upset   Social History   Socioeconomic History   Marital status: Married    Spouse name: Not on file   Number of children: 4   Years of education: 12   Highest education level: Not on file  Occupational History   Occupation: Sales executive  Social Designer, fashion/clothing strain: Not on file   Food insecurity    Worry: Not on file    Inability: Not on file   Transportation needs    Medical: Not on file    Non-medical: Not on file  Tobacco Use   Smoking status: Former Smoker    Packs/day: 0.50    Types: Cigarettes    Quit date: 05/10/2016    Years since quitting: 2.5   Smokeless tobacco: Never Used  Substance and Sexual Activity   Alcohol use: No   Drug use: Yes    Types: Marijuana    Comment: QD   Sexual activity: Not on file  Lifestyle   Physical activity    Days per week: Not on file    Minutes per session: Not on file   Stress: Not on file  Relationships   Social connections    Talks on phone: Not on file    Gets together: Not on file    Attends religious service: Not on file    Active member of club or organization: Not on file    Attends meetings of clubs or organizations: Not on file    Relationship status: Not on file   Intimate partner violence    Fear of current or ex partner: Not on file    Emotionally abused: Not on file    Physically abused: Not on file    Forced sexual activity: Not on file  Other Topics Concern   Not on file  Social History Narrative   Denies caffeine use    Family History  Problem Relation Age of Onset   Hyperlipidemia Mother    Hyperlipidemia Father         Review of Systems  All other systems reviewed and are negative.      Objective:   Physical Exam  Constitutional: He appears well-developed and well-nourished. No distress.  Neck:    Cardiovascular: Normal rate, regular rhythm, normal heart sounds and intact distal pulses. Exam reveals no gallop and no friction rub.  No murmur heard. Pulmonary/Chest: Effort normal and breath sounds normal. No respiratory distress. He has no wheezes. He has no rales. He exhibits no tenderness.  Abdominal: Soft. Bowel sounds are normal. He exhibits no distension and no mass. There is no abdominal tenderness. There is no rebound and no guarding.    Musculoskeletal:       Feet:  Skin: He is not diaphoretic.  Vitals reviewed.         Assessment & Plan:  Dermal nevus  Ventral hernia without obstruction or gangrene  Plantar wart  The lesions on his neck appear to be dermal nevi.  These appear benign.  Recommend clinical monitoring.  If they grow or change, we could perform excision.  Patient has a hernia just above his right inguinal canal.  Patient declines general surgery referral right now and elects just to monitor this area.  The plantar wart on his left fifth toe was debrided with a razor blade down even with the level of the skin and then the base of the wart was frozen with liquid nitrogen cryotherapy for a total of 30 seconds.  Recommended that after this heals, he can apply Dr. Felicie Morn wart remover to the area to ensure complete and total resolution if there is any residual lesion left.

## 2018-12-03 ENCOUNTER — Encounter: Payer: Self-pay | Admitting: Family Medicine

## 2018-12-31 ENCOUNTER — Encounter: Payer: Self-pay | Admitting: Family Medicine

## 2019-01-27 ENCOUNTER — Other Ambulatory Visit: Payer: Self-pay

## 2019-01-27 ENCOUNTER — Encounter: Payer: Self-pay | Admitting: Family Medicine

## 2019-01-27 DIAGNOSIS — Z20822 Contact with and (suspected) exposure to covid-19: Secondary | ICD-10-CM

## 2019-01-29 LAB — NOVEL CORONAVIRUS, NAA: SARS-CoV-2, NAA: NOT DETECTED

## 2019-02-22 ENCOUNTER — Other Ambulatory Visit: Payer: Self-pay

## 2019-02-22 DIAGNOSIS — Z20822 Contact with and (suspected) exposure to covid-19: Secondary | ICD-10-CM

## 2019-02-23 ENCOUNTER — Telehealth: Payer: Self-pay | Admitting: Family Medicine

## 2019-02-23 DIAGNOSIS — Z1211 Encounter for screening for malignant neoplasm of colon: Secondary | ICD-10-CM

## 2019-02-23 NOTE — Telephone Encounter (Signed)
Patient asking for referral for colonscopy  [phone]

## 2019-02-24 ENCOUNTER — Encounter: Payer: Self-pay | Admitting: Family Medicine

## 2019-02-24 LAB — NOVEL CORONAVIRUS, NAA: SARS-CoV-2, NAA: NOT DETECTED

## 2019-02-24 NOTE — Telephone Encounter (Signed)
Referral placed.

## 2019-02-25 ENCOUNTER — Encounter: Payer: Self-pay | Admitting: Gastroenterology

## 2019-03-04 ENCOUNTER — Encounter: Payer: Self-pay | Admitting: Family Medicine

## 2019-03-08 ENCOUNTER — Encounter: Payer: Self-pay | Admitting: Family Medicine

## 2019-03-12 ENCOUNTER — Other Ambulatory Visit: Payer: Self-pay | Admitting: Family Medicine

## 2019-03-13 ENCOUNTER — Encounter: Payer: Self-pay | Admitting: Family Medicine

## 2019-03-15 ENCOUNTER — Encounter: Payer: Self-pay | Admitting: Family Medicine

## 2019-03-19 ENCOUNTER — Encounter

## 2019-04-01 ENCOUNTER — Ambulatory Visit (INDEPENDENT_AMBULATORY_CARE_PROVIDER_SITE_OTHER): Admitting: Family Medicine

## 2019-04-01 ENCOUNTER — Encounter: Payer: Self-pay | Admitting: Family Medicine

## 2019-04-01 ENCOUNTER — Other Ambulatory Visit: Payer: Self-pay

## 2019-04-01 VITALS — BP 132/75 | HR 65 | Temp 98.0°F | Ht 66.0 in | Wt 182.2 lb

## 2019-04-01 DIAGNOSIS — G2581 Restless legs syndrome: Secondary | ICD-10-CM

## 2019-04-01 DIAGNOSIS — G4761 Periodic limb movement disorder: Secondary | ICD-10-CM | POA: Diagnosis not present

## 2019-04-01 MED ORDER — ROPINIROLE HCL ER 2 MG PO TB24: 2.0000 mg | ORAL_TABLET | Freq: Every day | ORAL | 11 refills | Status: DC

## 2019-04-01 NOTE — Progress Notes (Addendum)
PATIENT: Grant [name] DOB: [birth year]  REASON FOR VISIT: follow up HISTORY FROM: patient  Chief Complaint  Patient presents with  . Follow-up    6 mon f/u. Alone. Rm 6. Patient would like to discuss his requip dose. He stated that he is currently taking 13 pills of the requip to keep his RLS under control.      HISTORY OF PRESENT ILLNESS: Today 04/01/19 Grant [name] is a 64 y.o. male here today for follow up. 3,3 2at 5, 2at 9 and 3 at bedtime . He continues to have trouble with restlessness and jerking of both legs. He is taking 3 tablets every morning, 3 at lunch, 2 around 5pm, 2 at 9pm and 3 at bedtime for a total of 13 tablets daily (3.25mg .) He has had dizziness for the past couple of years but does not feel it is related to ropinirole. No side effects noted after taking ropinirole. Has tried Lyrica and gabapentin in the past but reports that he did not feel well on these. He reports suicidal ideations with both.    HISTORY: (copied from [employer] note on 11/27/2017)  Grant [name] is a 64 year old right-handed gentleman with an underlying medical history of hypertension, reflux disease, allergies, and overweight state, who presents for follow up consultation of his restless legs and PLMS. The patient is unaccompanied today. I last saw him on 11/19/2016, at which time he was taking ropinirole 0.25 mg strength at 3 different times. He had recently suffered serious dog bites that became infected and needed treatment for this as an inpatient. I suggested cautious increase in his Requip 0.25 mg strength 2 pills 3 times a day. He was advised regarding augmentation.   Today, 07/15/2017 (all dictated new, as well as above notes, some dictation done in note pad or Word, outside of chart, may appear as copied): He reportsfeeling about the same, maybe a little better. He takes requip 1 pill at 6 AM (2 pills was too much in AM), 1 to 2 at 4:30 and 2 at BT, which ranges from 10 PM to MN.  Sometimes he has symptoms in his arms. Sx of RLS date back to several years ago. He may have tried gabapentin in the past for Back pain, not sure if he tolerated it. He would be willing to retry it.  UPDATE 9/19/2019CM  Grant [name], 64 year old male returns for follow-up with history of restless leg syndrome.  He claims that his restless legs are a little bit worse.  He thinks his Toprol is making his restless legs worse so he has stopped the medication and wants [place] to make a suggestion however in reviewing side effects of Toprol restless legs is not a side effect.  His blood pressure is elevated in the office today at 156/61.  He was encouraged to follow-up with his primary care regarding his blood pressure.  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  His legs are more bothersome today because he had to climb a ladder.  He was started back on gabapentin after his last visit in May with Dr. Rexene Alberts, however patient states once he got to 300 mg he had suicidal idealizations and he cut it back to 100 mg.  He has not had further suicidal idealzations on that dose.  He currently takes Requip 0.25mg  3 tablets twice a day.  He has a history of anemia in the past but his most recent CBC with hemoglobin of 13.5.  He returns for  reevaluation   REVIEW OF SYSTEMS: Out of a complete 14 system review of symptoms, the patient complains only of the following symptoms, headache, dizziness, numbness  and all other reviewed systems are negative.  ALLERGIES: Allergies  Allergen Reactions  . Bee Venom Anaphylaxis, Swelling and Rash  . Asa [Aspirin] Other (See Comments)    Gi upset    HOME MEDICATIONS: Outpatient Medications Prior to Visit  Medication Sig Dispense Refill  . EPINEPHRINE 0.3 mg/0.3 mL IJ SOAJ injection INJECT 0.3 ML (0.3 MG) INTO THE MUSCLE ONCE FOR 1 DOSE 2 each 11  . losartan (COZAAR) 50 MG tablet Take 1 tablet (50 mg total) by mouth daily. 90 tablet 3  . pantoprazole (PROTONIX) 40 MG tablet  TAKE 1 TABLET DAILY 90 tablet 3  . rOPINIRole (REQUIP) 0.25 MG tablet Take 4 tablets (1 mg total) by mouth 2 (two) times daily. 4 pills up to 2 times a day. 720 tablet 1   No facility-administered medications prior to visit.    PAST MEDICAL HISTORY: Past Medical History:  Diagnosis Date  . Acid reflux   . Allergy   . Anemia   . Anxiety   . History of hiatal hernia   . Hypertension   . Insomnia   . Sensorineural hearing loss   . Von Willebrand disease ([place])    PT STATES THIS IS MILD AND HAS NEVER HAD TO SEE HEMATOLOGIST-PT DID SAY THAT WHEN [place] WAS [place] HE BLED ALOT BUT IT WAS DUE TO NOT FOLLOWING POST OP INSTRUCTIONS     PAST SURGICAL HISTORY: Past Surgical History:  Procedure Laterality Date  . CIRCUMCISION     AS AN ADULT  . FOOT SURGERY Right   . INCISION AND DRAINAGE Right 02/04/2013   Procedure: INCISION AND DRAINAGE;  Surgeon: Linna Hoff, MD;  Location: [facility];  Service: Orthopedics;  Laterality: Right;  . INGUINAL HERNIA REPAIR Bilateral 05/13/2018   Procedure: LAPAROSCOPIC BILATERAL INGUINAL HERNIA REPAIR;  Surgeon: Olean Ree, MD;  Location: [facility];  Service: General;  Laterality: Bilateral;  . OPEN REDUCTION INTERNAL FIXATION (ORIF) FINGER WITH RADIAL BONE GRAFT Right 02/04/2013   Procedure: OPEN REDUCTION INTERNAL FIXATION (ORIF) right ring and small fingers;  Surgeon: Linna Hoff, MD;  Location: [facility];  Service: Orthopedics;  Laterality: Right;    FAMILY HISTORY: Family History  Problem Relation Age of Onset  . Hyperlipidemia Mother   . Hyperlipidemia Father     SOCIAL HISTORY: Social History   Socioeconomic History  . Marital status: Married    Spouse name: Not on file  . Number of children: 4  . Years of education: 21  . Highest education level: Not on file  Occupational History  . Occupation: Sales executive   Tobacco Use  . Smoking status: Former Smoker    Packs/day: 0.50    Types: Cigarettes    Quit date:  05/10/2016    Years since quitting: 2.8  . Smokeless tobacco: Never Used  Substance and Sexual Activity  . Alcohol use: No  . Drug use: Yes    Types: Marijuana    Comment: QD  . Sexual activity: Not on file  Other Topics Concern  . Not on file  Social History Narrative   Denies caffeine use    Social Determinants of Health   Financial Resource Strain:   . Difficulty of Paying Living Expenses: Not on file  Food Insecurity:   . Worried About Charity fundraiser in  the Last Year: Not on file  . Ran Out of Food in the Last Year: Not on file  Transportation Needs:   . Lack of Transportation (Medical): Not on file  . Lack of Transportation (Non-Medical): Not on file  Physical Activity:   . Days of Exercise per Week: Not on file  . Minutes of Exercise per Session: Not on file  Stress:   . Feeling of Stress : Not on file  Social Connections:   . Frequency of Communication with Friends and Family: Not on file  . Frequency of Social Gatherings with Friends and Family: Not on file  . Attends Religious Services: Not on file  . Active Member of Clubs or Organizations: Not on file  . Attends Archivist Meetings: Not on file  . Marital Status: Not on file  Intimate Partner Violence:   . Fear of Current or Ex-Partner: Not on file  . Emotionally Abused: Not on file  . Physically Abused: Not on file  . Sexually Abused: Not on file      PHYSICAL EXAM  Vitals:   04/01/19 0934  BP: 132/75  Pulse: 65  Temp: 98 F (36.7 C)  TempSrc: Oral  Weight: 182 lb 3.2 oz (82.6 kg)  Height: 5\' 6"  (1.676 m)   Body mass index is 29.41 kg/m.  Generalized: Well developed, in no acute distress  Cardiology: normal rate and rhythm, no murmur noted Neurological examination  Mentation: Alert oriented to time, place, history taking. Follows all commands speech and language fluent Cranial nerve II-XII: Pupils were equal round reactive to light. Extraocular movements were full, visual field  were full on confrontational test. Facial sensation and strength were normal. Uvula tongue midline. Head turning and shoulder shrug  were normal and symmetric. Motor: The motor testing reveals 5 over 5 strength of all 4 extremities. Good symmetric motor tone is noted throughout.  Sensory: Sensory testing is intact to soft touch on all 4 extremities. No evidence of extinction is noted.  Coordination: Cerebellar testing reveals good finger-nose-finger and heel-to-shin bilaterally.  Gait and station: Gait is normal. Tandem gait is normal. Romberg is negative. No drift is seen.  Reflexes: Deep tendon reflexes are symmetric and brisk bilaterally.   DIAGNOSTIC DATA (LABS, IMAGING, TESTING) - I reviewed patient records, labs, notes, testing and imaging myself where available.  No flowsheet data found.   Lab Results  Component Value Date   WBC 4.6 04/03/2018   HGB 12.9 (L) 04/03/2018   HCT 37.8 (L) 04/03/2018   MCV 88.3 04/03/2018   PLT 253 04/03/2018      Component Value Date/Time   NA 141 04/03/2018 1124   K 4.0 04/03/2018 1124   CL 106 04/03/2018 1124   CO2 27 04/03/2018 1124   GLUCOSE 91 04/03/2018 1124   BUN 12 04/03/2018 1124   CREATININE 0.87 04/03/2018 1124   CALCIUM 9.2 04/03/2018 1124   PROT 6.0 (L) 04/03/2018 1124   ALBUMIN 4.0 06/17/2016 0945   AST 22 04/03/2018 1124   ALT 20 04/03/2018 1124   ALKPHOS 92 06/17/2016 0945   BILITOT 0.6 04/03/2018 1124   GFRNONAA 92 04/03/2018 1124   GFRAA 106 04/03/2018 1124   Lab Results  Component Value Date   CHOL 162 09/15/2017   HDL 32 (L) 09/15/2017   LDLCALC 102 (H) 09/15/2017   TRIG 168 (H) 09/15/2017   CHOLHDL 5.1 (H) 09/15/2017   Lab Results  Component Value Date   HGBA1C 5.3 11/16/2013   Lab Results  Component Value Date   [id number] 11/16/2013   Lab Results  Component Value Date   TSH 1.99 05/01/2006     ASSESSMENT AND PLAN 64 y.o. year old male  has a past medical history of Acid reflux, Allergy,  Anemia, Anxiety, History of hiatal hernia, Hypertension, Insomnia, Sensorineural hearing loss, and Von Willebrand disease ([place]). here with     ICD-10-CM   1. Restless leg syndrome  G25.81   2. PLMD (periodic limb movement disorder)  G47.61     Grant [name] is taking 13 tablets of ropinirole 0.25mg  daily. He does feel that this is managing symptoms. I have suggested we try an extended release tablet to help with more consistent management of RLS. He is in agreement. We will start ropinirole XL 2mg  daily. He was advised of potential side effects. Additional information provided in AVS. He was advised to ensure adequate hydration.  Well-balanced diet and regular exercise also advised.  He will follow-up with me in 3 months, sooner if needed.  He verbalizes understanding and agreement with this plan.   No orders of the defined types were placed in this encounter.    Meds ordered this encounter  Medications  . rOPINIRole (REQUIP XL) 2 MG 24 hr tablet    Sig: Take 1 tablet (2 mg total) by mouth at bedtime.    Dispense:  30 tablet    Refill:  11    Order Specific Question:   Supervising Provider    Answer:   Melvenia Beam [id number]       Carless Slatten Ubaldo Glassing, FNP-C 04/01/2019, 12:20 PM [facility] [address], [place], [place] [address] [phone]  I reviewed the above note and documentation by the Nurse Practitioner and agree with the history, exam, assessment and plan as outlined above. I was available for consultation. Star Age, MD, PhD [facility])

## 2019-04-01 NOTE — Patient Instructions (Signed)
We will try ropinirole XL 2mg  daily. Monitor for any concerning side effects as discussed.   Follow up in 3 months, sooner if needed    Ropinirole extended-release tablets What is this medicine? ROPINIROLE (roe PIN i role) is used to treat the symptoms of Parkinson's disease. It helps to improve muscle control and movement difficulties. This medicine may be used for other purposes; ask your health care provider or pharmacist if you have questions. COMMON BRAND NAME(S): Requip XL What should I tell my health care provider before I take this medicine? They need to know if you have any of these conditions:  heart disease  high blood pressure  kidney disease  liver disease  low blood pressure  narcolepsy  sleep apnea  an unusual or allergic reaction to ropinirole, other medicines, foods, dyes, or preservatives  pregnant or trying to get pregnant  breast-feeding How should I use this medicine? Take this medicine by mouth with a glass of water. Follow the directions on the prescription label. You can take it with or without food. If it upsets your stomach, take it with food. Do not cut, crush or chew this medicine. Take your doses at regular intervals. Do not take your medicine more often than directed. Do not stop taking this medicine except on your doctor's advice. Stopping this medicine too quickly may cause serious side effects. Talk to your pediatrician regarding the use of this medicine in children. Special care may be needed. Overdosage: If you think you have taken too much of this medicine contact a poison control center or emergency room at once. NOTE: This medicine is only for you. Do not share this medicine with others. What if I miss a dose? If you miss a dose, take it as soon as you can. If it is almost time for your next dose, take only that dose. Do not take double or extra doses. What may interact with this medicine?  certain medicines for depression, mood, or  psychotic disorders  ciprofloxacin  male hormones, like estrogens and birth control pills  fluvoxamine  metoclopramide  mexiletine  norfloxacin  omeprazole  rifampin This list may not describe all possible interactions. Give your health care provider a list of all the medicines, herbs, non-prescription drugs, or dietary supplements you use. Also tell them if you smoke, drink alcohol, or use illegal drugs. Some items may interact with your medicine. What should I watch for while using this medicine? Visit your health care professional for regular checks on your progress. Tell your health care professional if your symptoms do not start to get better or if they get worse. Do not stop taking except on your health care professional's advice. You may develop a severe reaction. Your health care professional will tell you how much medicine to take. You may get drowsy or dizzy. Do not drive, use machinery, or do anything that needs mental alertness until you know how this drug affects you. Do not stand or sit up quickly, especially if you are an older patient. This reduces the risk of dizzy or fainting spells. Alcohol may interfere with the effect of this medicine. Avoid alcoholic drinks. When taking this medicine, you may fall asleep without notice. You may be doing activities like driving a car, talking, or eating. You may not feel drowsy before it happens. Contact your health care provider right away if this happens to you. There have been reports of increased sexual urges or other strong urges such as gambling while taking this  medicine. If you experience any of these while taking this medicine, you should report this to your health care provider as soon as possible. Your mouth may get dry. Chewing sugarless gum or sucking hard candy and drinking plenty of water may help. Contact your health care professional if the problem does not go away or is severe. You should check your skin often for  changes to moles and new growths while taking this medicine. Call your doctor if you notice any of these changes. What side effects may I notice from receiving this medicine? Side effects that you should report to your doctor or health care professional as soon as possible:  allergic reactions like skin rash, itching or hives, swelling of the face, lips, or tongue  breathing problems  changes in emotions or moods  changes in vision  chest pain  confusion  falling asleep during normal activities like driving  fast, irregular heartbeat  hallucinations  joint or muscle pain  loss of bladder control  loss of memory  new or increased gambling urges, sexual urges, uncontrolled spending, binge or compulsive eating, or other urges  pain, tingling, numbness in the hands or feet  signs and symptoms of low blood pressure like dizziness; feeling faint or lightheaded, falls; unusually weak or tired  swelling of the ankles, feet, hands  uncontrollable movements of the arms, face, head, mouth, neck, or upper body  vomiting Side effects that usually do not require medical attention (report to your doctor or health care professional if they continue or are bothersome):  dizziness  drowsiness  headache  increased sweating  nausea This list may not describe all possible side effects. Call your doctor for medical advice about side effects. You may report side effects to FDA at 1-800-FDA-1088. Where should I keep my medicine? Keep out of the reach of children. Store at room temperature between 15 and 30 degrees C (59 and 86 degrees F). Protect from light and moisture. Keep container tightly closed. Throw away any unused medicine after the expiration date. NOTE: This sheet is a summary. It may not cover all possible information. If you have questions about this medicine, talk to your doctor, pharmacist, or health care provider.  2020 Elsevier/Gold Standard (2018-10-29  16:57:28)   Restless Legs Syndrome Restless legs syndrome is a condition that causes uncomfortable feelings or sensations in the legs, especially while sitting or lying down. The sensations usually cause an overwhelming urge to move the legs. The arms can also sometimes be affected. The condition can range from mild to severe. The symptoms often interfere with a person's ability to sleep. What are the causes? The cause of this condition is not known. What increases the risk? The following factors may make you more likely to develop this condition:  Being older than 50.  Pregnancy.  Being a woman. In general, the condition is more common in women than in men.  A family history of the condition.  Having iron deficiency.  Overuse of caffeine, nicotine, or alcohol.  Certain medical conditions, such as kidney disease, Parkinson's disease, or nerve damage.  Certain medicines, such as those for high blood pressure, nausea, colds, allergies, depression, and some heart conditions. What are the signs or symptoms? The main symptom of this condition is uncomfortable sensations in the legs, such as:  Pulling.  Tingling.  Prickling.  Throbbing.  Crawling.  Burning. Usually, the sensations:  Affect both sides of the body.  Are worse when you sit or lie down.  Are  worse at night. These may wake you up or make it difficult to fall asleep.  Make you have a strong urge to move your legs.  Are temporarily relieved by moving your legs. The arms can also be affected, but this is rare. People who have this condition often have tiredness during the day because of their lack of sleep at night. How is this diagnosed? This condition may be diagnosed based on:  Your symptoms.  Blood tests. In some cases, you may be monitored in a sleep lab by a specialist (a sleep study). This can detect any disruptions in your sleep. How is this treated? This condition is treated by managing the  symptoms. This may include:  Lifestyle changes, such as exercising, using relaxation techniques, and avoiding caffeine, alcohol, or tobacco.  Medicines. Anti-seizure medicines may be tried first. Follow these instructions at home:     General instructions  Take over-the-counter and prescription medicines only as told by your health care provider.  Use methods to help relieve the uncomfortable sensations, such as: ? Massaging your legs. ? Walking or stretching. ? Taking a cold or hot bath.  Keep all follow-up visits as told by your health care provider. This is important. Lifestyle  Practice good sleep habits. For example, go to bed and get up at the same time every day. Most adults should get 7-9 hours of sleep each night.  Exercise regularly. Try to get at least 30 minutes of exercise most days of the week.  Practice ways of relaxing, such as yoga or meditation.  Avoid caffeine and alcohol.  Do not use any products that contain nicotine or tobacco, such as cigarettes and e-cigarettes. If you need help quitting, ask your health care provider. Contact a health care provider if:  Your symptoms get worse or they do not improve with treatment. Summary  Restless legs syndrome is a condition that causes uncomfortable feelings or sensations in the legs, especially while sitting or lying down.  The symptoms often interfere with a person's ability to sleep.  This condition is treated by managing the symptoms. You may need to make lifestyle changes or take medicines. This information is not intended to replace advice given to you by your health care provider. Make sure you discuss any questions you have with your health care provider. Document Revised: 03/17/2017 Document Reviewed: 03/17/2017 Elsevier Patient Education  [place].

## 2019-04-02 ENCOUNTER — Encounter: Admitting: Gastroenterology

## 2019-04-04 ENCOUNTER — Encounter: Payer: Self-pay | Admitting: Family Medicine

## 2019-04-20 ENCOUNTER — Encounter: Payer: Self-pay | Admitting: Family Medicine

## 2019-04-20 ENCOUNTER — Ambulatory Visit (INDEPENDENT_AMBULATORY_CARE_PROVIDER_SITE_OTHER): Payer: Medicare Other | Admitting: Family Medicine

## 2019-04-20 ENCOUNTER — Other Ambulatory Visit: Payer: Self-pay

## 2019-04-20 VITALS — BP 122/58 | HR 76 | Temp 97.4°F | Resp 16 | Ht 69.0 in | Wt 178.0 lb

## 2019-04-20 DIAGNOSIS — L308 Other specified dermatitis: Secondary | ICD-10-CM

## 2019-04-20 DIAGNOSIS — M7551 Bursitis of right shoulder: Secondary | ICD-10-CM

## 2019-04-20 MED ORDER — MOMETASONE FUROATE 0.1 % EX CREA: 1.0000 "application " | TOPICAL_CREAM | Freq: Every day | CUTANEOUS | 0 refills | Status: DC

## 2019-04-20 MED ORDER — AMLODIPINE BESYLATE 10 MG PO TABS: 10.0000 mg | ORAL_TABLET | Freq: Every day | ORAL | 3 refills | Status: DC

## 2019-04-20 NOTE — Progress Notes (Signed)
Subjective:    Patient ID: Grant [name], male    DOB: 12/28/1955, 64 y.o.   MRN: [id number] Patient works as an Clinical biochemist.  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  Over the last few weeks he has developed pain in his right shoulder.  He has pain with abduction greater than 90 degrees.  He has a positive empty can sign but a negative Hawking sign.  There is no crepitus on exam.  There is no erythema or effusion.  He has negative Spurling sign.  He has good strength and no evidence of a tear.  His history and physical exam supports subacromial bursitis due to repetitive overhead activity.  He also has an eczema-like rash on his right shin.  The rash is in a linear pattern down the surface of the tibia.  The skin there is slightly pink.  There are horizontal striations to suggest dry skin and lichenification.  There is no scale.  There is no warmth or pain.  I suspect atopic dermatitis. Past Medical History:  Diagnosis Date  . Acid reflux   . Allergy   . Anemia   . Anxiety   . History of hiatal hernia   . Hypertension   . Insomnia   . Sensorineural hearing loss   . Von Willebrand disease ([place])    PT STATES THIS IS MILD AND HAS NEVER HAD TO SEE HEMATOLOGIST-PT DID SAY THAT WHEN [place] HE BLED ALOT BUT IT WAS DUE TO NOT FOLLOWING POST OP INSTRUCTIONS    Past Surgical History:  Procedure Laterality Date  . CIRCUMCISION     AS AN ADULT  . FOOT SURGERY Right   . INCISION AND DRAINAGE Right 02/04/2013   Procedure: INCISION AND DRAINAGE;  Surgeon: Linna Hoff, MD;  Location: [facility];  Service: Orthopedics;  Laterality: Right;  . INGUINAL HERNIA REPAIR Bilateral 05/13/2018   Procedure: LAPAROSCOPIC BILATERAL INGUINAL HERNIA REPAIR;  Surgeon: Olean Ree, MD;  Location: [facility];  Service: General;  Laterality: Bilateral;  . OPEN REDUCTION INTERNAL FIXATION (ORIF) FINGER WITH RADIAL BONE GRAFT Right 02/04/2013   Procedure: OPEN REDUCTION INTERNAL FIXATION (ORIF) right ring and small fingers;  Surgeon: Linna Hoff, MD;  Location: [place];  Service: Orthopedics;  Laterality: Right;   Current Outpatient Medications on File Prior to Visit  Medication Sig Dispense Refill  . EPINEPHRINE 0.3 mg/0.3 mL IJ SOAJ injection INJECT 0.3 ML (0.3 MG) INTO THE MUSCLE ONCE FOR 1 DOSE 2 each 11  . losartan (COZAAR) 50 MG tablet Take 1 tablet (50 mg total) by mouth daily. 90 tablet 3  . pantoprazole (PROTONIX) 40 MG tablet TAKE 1 TABLET DAILY 90 tablet 3  . rOPINIRole (REQUIP XL) 2 MG 24 hr tablet Take 1 tablet (2 mg total) by mouth at bedtime. 30 tablet 11   No current facility-administered medications on file prior to visit.   Allergies  Allergen Reactions  . Bee Venom Anaphylaxis, Swelling and Rash  . Asa [Aspirin] Other (See Comments)    Gi upset   Social History   Socioeconomic History  . Marital status: Married    Spouse name: Not on file  . Number of children: 4  . Years of education: 58  . Highest education level: Not on file  Occupational History  . Occupation: Sales executive   Tobacco Use  . Smoking status: Former Smoker    Packs/day: 0.50  Types: Cigarettes    Quit date: 05/10/2016    Years since quitting: 2.9  . Smokeless tobacco: Never Used  Substance and Sexual Activity  . Alcohol use: No  . Drug use: Yes    Types: Marijuana    Comment: QD  . Sexual activity: Not on file  Other Topics Concern  . Not on file  Social History Narrative   Denies caffeine use    Social Determinants of Health   Financial Resource Strain:   . Difficulty of Paying Living Expenses: Not on file  Food Insecurity:   . Worried About Charity fundraiser in the Last Year: Not on file  . Ran Out of Food in the Last Year: Not on file  Transportation Needs:   . Lack of Transportation (Medical): Not on file  . Lack of Transportation (Non-Medical): Not on file  Physical Activity:   . Days of Exercise per Week:  Not on file  . Minutes of Exercise per Session: Not on file  Stress:   . Feeling of Stress : Not on file  Social Connections:   . Frequency of Communication with Friends and Family: Not on file  . Frequency of Social Gatherings with Friends and Family: Not on file  . Attends Religious Services: Not on file  . Active Member of Clubs or Organizations: Not on file  . Attends Archivist Meetings: Not on file  . Marital Status: Not on file  Intimate Partner Violence:   . Fear of Current or Ex-Partner: Not on file  . Emotionally Abused: Not on file  . Physically Abused: Not on file  . Sexually Abused: Not on file   Family History  Problem Relation Age of Onset  . Hyperlipidemia Mother   . Hyperlipidemia Father        Review of Systems  All other systems reviewed and are negative.      Objective:   Physical Exam  Constitutional: He appears well-developed and well-nourished. No distress.  Neck:    Cardiovascular: Normal rate, regular rhythm, normal heart sounds and intact distal pulses. Exam reveals no gallop and no friction rub.  No murmur heard. Pulmonary/Chest: Effort normal and breath sounds normal. No respiratory distress. He has no wheezes. He has no rales. He exhibits no tenderness.  Abdominal:    Musculoskeletal:     Right shoulder: Tenderness and pain present. No effusion, bony tenderness or crepitus. Decreased range of motion. Normal strength.  Skin: Rash noted. He is not diaphoretic.     Vitals reviewed.         Assessment & Plan:  Other eczema  Subacromial bursitis of right shoulder joint  I believe the patient has subacromial bursitis in his right shoulder.  Using sterile technique, I injected the subacromial space with 2 cc lidocaine, 2 cc of Marcaine, and 2 cc of 40 mg/mL Kenalog.  Patient tolerated the procedure well without complication.  I believe the rash on his right shin is likely atopic dermatitis.  I will treat this with Elocon  cream applied daily for 7 to 14 days.  His blood pressure is doing extremely well since we resumed amlodipine back in September.  Therefore I will send him a prescription to last 90 days.

## 2019-04-27 ENCOUNTER — Ambulatory Visit (AMBULATORY_SURGERY_CENTER): Payer: Medicare Other | Admitting: *Deleted

## 2019-04-27 ENCOUNTER — Other Ambulatory Visit: Payer: Self-pay

## 2019-04-27 VITALS — Temp 98.2°F | Ht 69.0 in | Wt 178.6 lb

## 2019-04-27 DIAGNOSIS — Z1211 Encounter for screening for malignant neoplasm of colon: Secondary | ICD-10-CM

## 2019-04-27 MED ORDER — NA SULFATE-K SULFATE-MG SULF 17.5-3.13-1.6 GM/177ML PO SOLN: ORAL | 0 refills | Status: DC

## 2019-04-27 NOTE — Progress Notes (Signed)
Pt had covid positive test 02-22-20- no need for pre procedure covid test  Pt is aware that care partner will wait in the car during procedure; if they feel like they will be too hot or cold to wait in the car; they may wait in the 4 th floor lobby. Patient is aware to bring only one care partner. We want them to wear a mask (we do not have any that we can provide them), practice social distancing, and we will check their temperatures when they get here.  I did remind the patient that their care partner needs to stay in the parking lot the entire time and have a cell phone available, we will call them when the pt is ready for discharge. Patient will wear mask into building.  No trouble with anesthesia, difficulty with intubation or hx/fam hx of malignant hypthermia  No egg or soy allergy  No home oxygen use   No medications for weight loss taken  emmi information given

## 2019-04-29 ENCOUNTER — Encounter: Payer: Self-pay | Admitting: Family Medicine

## 2019-05-01 ENCOUNTER — Other Ambulatory Visit: Payer: Self-pay | Admitting: Neurology

## 2019-05-01 DIAGNOSIS — G4761 Periodic limb movement disorder: Secondary | ICD-10-CM

## 2019-05-03 MED ORDER — ROPINIROLE HCL ER 2 MG PO TB24: 2.0000 mg | ORAL_TABLET | Freq: Two times a day (BID) | ORAL | 11 refills | Status: DC

## 2019-05-11 ENCOUNTER — Ambulatory Visit (AMBULATORY_SURGERY_CENTER): Payer: Medicare Other | Admitting: Gastroenterology

## 2019-05-11 ENCOUNTER — Other Ambulatory Visit: Payer: Self-pay

## 2019-05-11 ENCOUNTER — Encounter: Payer: Self-pay | Admitting: Gastroenterology

## 2019-05-11 VITALS — BP 101/62 | HR 54 | Temp 98.0°F | Resp 16 | Ht 66.0 in | Wt 178.0 lb

## 2019-05-11 DIAGNOSIS — Z1211 Encounter for screening for malignant neoplasm of colon: Secondary | ICD-10-CM

## 2019-05-11 DIAGNOSIS — K5730 Diverticulosis of large intestine without perforation or abscess without bleeding: Secondary | ICD-10-CM

## 2019-05-11 DIAGNOSIS — K635 Polyp of colon: Secondary | ICD-10-CM | POA: Diagnosis not present

## 2019-05-11 DIAGNOSIS — K641 Second degree hemorrhoids: Secondary | ICD-10-CM

## 2019-05-11 DIAGNOSIS — D128 Benign neoplasm of rectum: Secondary | ICD-10-CM

## 2019-05-11 DIAGNOSIS — D127 Benign neoplasm of rectosigmoid junction: Secondary | ICD-10-CM

## 2019-05-11 MED ORDER — SODIUM CHLORIDE 0.9 % IV SOLN: 500.0000 mL | Freq: Once | INTRAVENOUS | Status: DC

## 2019-05-11 NOTE — Progress Notes (Signed)
Report given to PACU, vss 

## 2019-05-11 NOTE — Patient Instructions (Signed)
Handouts on diverticulosis, hemorrhoids, and polyps given to you today  Await pathology results  Add a fiber supplement to your diet     YOU HAD AN ENDOSCOPIC PROCEDURE TODAY AT [facility]:   Refer to the procedure report that was given to you for any specific questions about what was found during the examination.  If the procedure report does not answer your questions, please call your gastroenterologist to clarify.  If you requested that your care partner not be given the details of your procedure findings, then the procedure report has been included in a sealed envelope for you to review at your convenience later.  YOU SHOULD EXPECT: Some feelings of bloating in the abdomen. Passage of more gas than usual.  Walking can help get rid of the air that was put into your GI tract during the procedure and reduce the bloating. If you had a lower endoscopy (such as a colonoscopy or flexible sigmoidoscopy) you may notice spotting of blood in your stool or on the toilet paper. If you underwent a bowel prep for your procedure, you may not have a normal bowel movement for a few days.  Please Note:  You might notice some irritation and congestion in your nose or some drainage.  This is from the oxygen used during your procedure.  There is no need for concern and it should clear up in a day or so.  SYMPTOMS TO REPORT IMMEDIATELY:   Following lower endoscopy (colonoscopy or flexible sigmoidoscopy):  Excessive amounts of blood in the stool  Significant tenderness or worsening of abdominal pains  Swelling of the abdomen that is new, acute  Fever of 100F or higher  For urgent or emergent issues, a gastroenterologist can be reached at any hour by calling [phone].   DIET:  We do recommend a small meal at first, but then you may proceed to your regular diet.  Drink plenty of fluids but you should avoid alcoholic beverages for 24 hours.  ACTIVITY:  You should plan to take it easy for  the rest of today and you should NOT DRIVE or use heavy machinery until tomorrow (because of the sedation medicines used during the test).    FOLLOW UP: Our staff will call the number listed on your records 48-72 hours following your procedure to check on you and address any questions or concerns that you may have regarding the information given to you following your procedure. If we do not reach you, we will leave a message.  We will attempt to reach you two times.  During this call, we will ask if you have developed any symptoms of COVID 19. If you develop any symptoms (ie: fever, flu-like symptoms, shortness of breath, cough etc.) before then, please call [phone].  If you test positive for Covid 19 in the 2 weeks post procedure, please call and report this information to [place].    If any biopsies were taken you will be contacted by phone or by letter within the next 1-3 weeks.  Please call us at [phone] if you have not heard about the biopsies in 3 weeks.    SIGNATURES/CONFIDENTIALITY: You and/or your care partner have signed paperwork which will be entered into your electronic medical record.  These signatures attest to the fact that that the information above on your After Visit Summary has been reviewed and is understood.  Full responsibility of the confidentiality of this discharge information lies with you and/or your care-partner.

## 2019-05-11 NOTE — Op Note (Signed)
[facility] Patient Name: Grant [name] Procedure Date: 05/11/2019 10:14 AM MRN: [id number] Endoscopist: Gerrit Heck , MD Age: 64 Referring MD:  Date of Birth: 10/20/1955 Gender: Male Account #: [id number] Procedure:                Colonoscopy Indications:              Screening for colorectal malignant neoplasm (last                            colonoscopy was more than 10 years ago)                           Last colonoscopy was approximately 15 years ago.                            Otherwise, no active GI symptoms. Medicines:                Monitored Anesthesia Care Procedure:                Pre-Anesthesia Assessment:                           - Prior to the procedure, a History and Physical                            was performed, and patient medications and                            allergies were reviewed. The patient's tolerance of                            previous anesthesia was also reviewed. The risks                            and benefits of the procedure and the sedation                            options and risks were discussed with the patient.                            All questions were answered, and informed consent                            was obtained. Prior Anticoagulants: The patient has                            taken no previous anticoagulant or antiplatelet                            agents. ASA Grade Assessment: II - A patient with                            mild systemic disease. After reviewing the risks  and benefits, the patient was deemed in                            satisfactory condition to undergo the procedure.                           After obtaining informed consent, the colonoscope                            was passed under direct vision. Throughout the                            procedure, the patient's blood pressure, pulse, and                            oxygen saturations were monitored  continuously. The                            Colonoscope was introduced through the anus and                            advanced to the the cecum, identified by                            appendiceal orifice and ileocecal valve. The                            colonoscopy was performed without difficulty. The                            patient tolerated the procedure well. The quality                            of the bowel preparation was excellent. The                            ileocecal valve, appendiceal orifice, and rectum                            were photographed. Scope In: 10:33:06 AM Scope Out: 10:49:43 AM Scope Withdrawal Time: 0 hours 13 minutes 12 seconds  Total Procedure Duration: 0 hours 16 minutes 37 seconds  Findings:                 Hemorrhoids were found on perianal exam.                           Three sessile polyps were found in the                            recto-sigmoid colon. The polyps were 2 to 3 mm in                            size. These polyps were removed with a cold biopsy  forceps. Resection and retrieval were complete.                            Estimated blood loss was minimal.                           A 8 mm polyp was found in the rectum. The polyp was                            pedunculated. The polyp was removed with a hot                            snare. Resection and retrieval were complete.                            Estimated blood loss: none.                           Multiple small and large-mouthed diverticula were                            found in the sigmoid colon and transverse colon.                           Non-bleeding internal hemorrhoids were found during                            retroflexion. The hemorrhoids were medium-sized and                            Grade II (internal hemorrhoids that prolapse but                            reduce spontaneously). Complications:            No immediate  complications. Estimated Blood Loss:     Estimated blood loss was minimal. Impression:               - Hemorrhoids found on perianal exam.                           - Three 2 to 3 mm polyps at the recto-sigmoid                            colon, removed with a cold biopsy forceps. Resected                            and retrieved.                           - One 8 mm polyp in the rectum, removed with a hot                            snare. Resected and retrieved.                           -  Diverticulosis in the sigmoid colon and in the                            transverse colon.                           - Non-bleeding internal hemorrhoids. Recommendation:           - Patient has a contact number available for                            emergencies. The signs and symptoms of potential                            delayed complications were discussed with the                            patient. Return to normal activities tomorrow.                            Written discharge instructions were provided to the                            patient.                           - Resume previous diet.                           - Continue present medications.                           - Await pathology results.                           - Repeat colonoscopy for surveillance based on                            pathology results.                           - Return to GI clinic PRN.                           - Use fiber, for example Citrucel, Fibercon, Konsyl                            or Metamucil.                           - Internal hemorrhoids were noted on this study and                            may be amenable to hemorrhoid band ligation. If you                            are interested in further treatment of these  hemorrhoids with band ligation, please contact my                            clinic to set up an appointment for evaluation and                             treatment. Gerrit Heck, MD 05/11/2019 10:55:54 AM

## 2019-05-11 NOTE — Progress Notes (Signed)
Called to room to assist during endoscopic procedure.  Patient ID and intended procedure confirmed with present staff. Received instructions for my participation in the procedure from the performing physician.  

## 2019-05-11 NOTE — Progress Notes (Signed)
Pt's states no medical or surgical changes since previsit or office visit.  LC temp, SB IV and CW vitals.  Pt used marijuana last night.

## 2019-05-13 ENCOUNTER — Telehealth: Payer: Self-pay

## 2019-05-13 NOTE — Telephone Encounter (Signed)
  Follow up Call-  Call back number 05/11/2019  Post procedure Call Back phone  # [phone]  Permission to leave phone message Yes  Some recent data might be hidden     Patient questions:  Do you have a fever, pain , or abdominal swelling? No. Pain Score  0 *  Have you tolerated food without any problems? Yes.    Have you been able to return to your normal activities? Yes.    Do you have any questions about your discharge instructions: Diet   No. Medications  No. Follow up visit  No.  Do you have questions or concerns about your Care? No.  Actions: * If pain score is 4 or above: No action needed, pain <4.  1. Have you developed a fever since your procedure? no  2.   Have you had an respiratory symptoms (SOB or cough) since your procedure? no  3.   Have you tested positive for COVID 19 since your procedure no  4.   Have you had any family members/close contacts diagnosed with the COVID 19 since your procedure?  no   If yes to any of these questions please route to Joylene John, RN and Alphonsa Gin, Therapist, sports.

## 2019-05-15 ENCOUNTER — Telehealth: Payer: Self-pay | Admitting: Neurology

## 2019-05-15 MED ORDER — ROPINIROLE HCL ER 2 MG PO TB24: ORAL_TABLET | ORAL | 1 refills | Status: DC

## 2019-05-15 NOTE — Telephone Encounter (Signed)
Received message from answering service ---In need of medication Ropinirole ER 2 mg [facility]  I called twice and got "person you are calling has a voice mailbox that has not been set up" message  I see where there are multiple refills so I do not know what the problem is.  If he calls answering service back, I will try to reach him again

## 2019-05-15 NOTE — Telephone Encounter (Signed)
I reached patient.   He needed #16 pills sent to local pharmacy to cover him until [facility] refills bid prescription.      I sent prescription in.

## 2019-05-16 ENCOUNTER — Encounter: Payer: Self-pay | Admitting: Surgery

## 2019-05-17 ENCOUNTER — Encounter: Payer: Self-pay | Admitting: Gastroenterology

## 2019-05-19 ENCOUNTER — Encounter: Payer: Self-pay | Admitting: Family Medicine

## 2019-05-20 ENCOUNTER — Other Ambulatory Visit: Payer: Self-pay | Admitting: Family Medicine

## 2019-05-20 DIAGNOSIS — K469 Unspecified abdominal hernia without obstruction or gangrene: Secondary | ICD-10-CM

## 2019-05-27 ENCOUNTER — Other Ambulatory Visit: Payer: Self-pay | Admitting: Family Medicine

## 2019-06-07 ENCOUNTER — Encounter: Payer: Self-pay | Admitting: Family Medicine

## 2019-06-07 ENCOUNTER — Ambulatory Visit (INDEPENDENT_AMBULATORY_CARE_PROVIDER_SITE_OTHER): Payer: Medicare Other | Admitting: Surgery

## 2019-06-07 ENCOUNTER — Other Ambulatory Visit: Payer: Self-pay

## 2019-06-07 ENCOUNTER — Encounter: Payer: Self-pay | Admitting: Surgery

## 2019-06-07 ENCOUNTER — Telehealth: Payer: Self-pay | Admitting: Emergency Medicine

## 2019-06-07 VITALS — BP 137/71 | HR 67 | Temp 98.2°F | Resp 12 | Ht 68.0 in | Wt 180.0 lb

## 2019-06-07 DIAGNOSIS — K4091 Unilateral inguinal hernia, without obstruction or gangrene, recurrent: Secondary | ICD-10-CM | POA: Diagnosis not present

## 2019-06-07 NOTE — Telephone Encounter (Signed)
Medical Clearance sent to Dr Jenna Luo [phone]) at this time.

## 2019-06-07 NOTE — Progress Notes (Signed)
06/07/2019  History of Present Illness: Grant [name] is a 64 y.o. male s/p laparoscopic bilateral inguinal hernia repair on 05/13/2018.  Patient presents today because he has a recurrence of his hernia on the right groin.  The patient reports that shortly after surgery, he lifted something heavy by mistake.  He felt discomfort in the right groin afterwards.  He's unsure as to when exactly he felt a bulge come up again, but more recently, it's been causing more discomfort.  Denies any urinary of GI difficulties.  His pain is mostly in the right groin, and does not radiate.  Denies any issues with the left groin or the laparoscopic incisions.  Past Medical History: Past Medical History:  Diagnosis Date  . Acid reflux   . Allergy   . Anemia   . Anxiety   . History of hiatal hernia   . Hypertension   . Insomnia   . RLS (restless legs syndrome)   . Sensorineural hearing loss   . Von Willebrand disease ([place])    PT STATES THIS IS MILD AND HAS NEVER HAD TO SEE HEMATOLOGIST-PT DID SAY THAT WHEN [place] HE BLED ALOT BUT IT WAS DUE TO NOT FOLLOWING POST OP INSTRUCTIONS      Past Surgical History: Past Surgical History:  Procedure Laterality Date  . CIRCUMCISION     AS AN ADULT  . COLONOSCOPY    . FOOT SURGERY Right   . INCISION AND DRAINAGE Right 02/04/2013   Procedure: INCISION AND DRAINAGE;  Surgeon: Linna Hoff, MD;  Location: [facility];  Service: Orthopedics;  Laterality: Right;  . INGUINAL HERNIA REPAIR Bilateral 05/13/2018   Procedure: LAPAROSCOPIC BILATERAL INGUINAL HERNIA REPAIR;  Surgeon: Olean Ree, MD;  Location: [facility];  Service: General;  Laterality: Bilateral;  . OPEN REDUCTION INTERNAL FIXATION (ORIF) FINGER WITH RADIAL BONE GRAFT Right 02/04/2013   Procedure: OPEN REDUCTION INTERNAL FIXATION (ORIF) right ring and small fingers;  Surgeon: Linna Hoff, MD;  Location: [facility];  Service: Orthopedics;  Laterality: Right;    Home  Medications: Prior to Admission medications   Medication Sig Start Date End Date Taking? Authorizing Provider  amLODipine (NORVASC) 10 MG tablet Take 1 tablet (10 mg total) by mouth daily. 04/20/19   Susy Frizzle, MD  EPINEPHRINE 0.3 mg/0.3 mL IJ SOAJ injection INJECT 0.3 ML (0.3 MG) INTO THE MUSCLE ONCE FOR 1 DOSE 03/15/19   Susy Frizzle, MD  fluticasone [facility]) 50 MCG/ACT nasal spray Place 2 sprays into both nostrils daily. 04/29/19   [provider]  losartan (COZAAR) 50 MG tablet Take 1 tablet (50 mg total) by mouth daily. 09/15/18   Susy Frizzle, MD  mometasone (ELOCON) 0.1 % cream Apply 1 application topically daily. 04/20/19   Susy Frizzle, MD  pantoprazole (PROTONIX) 40 MG tablet TAKE 1 TABLET DAILY 05/28/19   Susy Frizzle, MD  rOPINIRole (REQUIP XL) 2 MG 24 hr tablet Take 1 tablet twice a day 05/15/19   Sater, Nanine Means, MD    Allergies: Allergies  Allergen Reactions  . Bee Venom Anaphylaxis, Swelling and Rash  . Asa [Aspirin] Other (See Comments)    Gi upset    Review of Systems: Review of Systems  Constitutional: Negative for chills and fever.  Respiratory: Negative for shortness of breath.   Cardiovascular: Negative for chest pain.  Gastrointestinal: Positive for abdominal pain. Negative for constipation, diarrhea, nausea and vomiting.  Genitourinary: Negative for dysuria.  Musculoskeletal: Negative for myalgias.    Physical Exam BP 137/71   Pulse 67   Temp 98.2 F (36.8 C) (Temporal)   Resp 12   Ht 5\' 8"  (1.727 m)   Wt 180 lb (81.6 kg)   SpO2 97%   BMI 27.37 kg/m  CONSTITUTIONAL: No acute distress HEENT:  Normocephalic, atraumatic, extraocular motion intact. RESPIRATORY:  Lungs are clear, and breath sounds are equal bilaterally. Normal respiratory effort without pathologic use of accessory muscles. CARDIOVASCULAR: Heart is regular without murmurs, gallops, or rubs. GI: The abdomen is soft, non-distended, non-tender.  Patient has a  recurrent right inguinal hernia, which is reducible.  No significant tenderness on manipulation.  No evidence of hernia on the left, or at the umbilicus. NEUROLOGIC:  Motor and sensation is grossly normal.  Cranial nerves are grossly intact. PSYCH:  Alert and oriented to person, place and time. Affect is normal.  Labs/Imaging: None recently  Assessment and Plan: This is a 64 y.o. male with a recurrent right inguinal hernia  --Discussed with the patient that the recurrence could have been from the heavy lifting shortly after surgery, but could also have been a technical issue.  At this point, it would be unclear.  However, this is something that can be repaired. --Discussed with him that the surgical approach would be via open surgery, since it is unscarred territory, compared to trying to do this laparoscopically again or robotically.  Discussed post-op recovery, activity restrictions, risks of bleeding, infection, and injury to surrounding structures, and he's willing to proceed. --Will schedule him for 06/15/19.  He understands he would need to get COVID test prior to surgery.  Will send medical clearance form to his PCP.  Face-to-face time spent with the patient and care providers was 25 minutes, with more than 50% of the time spent counseling, educating, and coordinating care of the patient.     Melvyn Neth, [facility]

## 2019-06-07 NOTE — Patient Instructions (Addendum)
Our surgery scheduler will contact you to schedule your surgery. Please have the BLUE SHEET available when she calls you.  We will need to obtain Medical Clearance before you undergo surgery.  Call the office if you have any questions or concerns.   Open Hernia Repair, Adult  Open hernia repair is a surgical procedure to fix a hernia. A hernia occurs when an internal organ or tissue pushes out through a weak spot in the abdominal wall muscles. Hernias commonly occur in the groin and around the navel. Most hernias tend to get worse over time. Often, surgery is done to prevent the hernia from becoming bigger, uncomfortable, or an emergency. Emergency surgery may be needed if abdominal contents get stuck in the opening (incarcerated hernia) or the blood supply gets cut off (strangulated hernia). In an open repair, an incision is made in the abdomen to perform the surgery. Tell a health care provider about:  Any allergies you have.  All medicines you are taking, including vitamins, herbs, eye drops, creams, and over-the-counter medicines.  Any problems you or family members have had with anesthetic medicines.  Any blood or bone disorders you have.  Any surgeries you have had.  Any medical conditions you have, including any recent cold or flu symptoms.  Whether you are pregnant or may be pregnant. What are the risks? Generally, this is a safe procedure. However, problems may occur, including:  Long-lasting (chronic) pain.  Bleeding.  Infection.  Damage to the testicle. This can cause shrinking or swelling.  Damage to the bladder, blood vessels, intestine, or nerves near the hernia.  Trouble passing urine.  Allergic reactions to medicines.  Return of the hernia. What happens before the procedure? Staying hydrated Follow instructions from your health care provider about hydration, which may include:  Up to 2 hours before the procedure - you may continue to drink clear liquids,  such as water, clear fruit juice, black coffee, and plain tea. Eating and drinking restrictions Follow instructions from your health care provider about eating and drinking, which may include:  8 hours before the procedure - stop eating heavy meals or foods such as meat, fried foods, or fatty foods.  6 hours before the procedure - stop eating light meals or foods, such as toast or cereal.  6 hours before the procedure - stop drinking milk or drinks that contain milk.  2 hours before the procedure - stop drinking clear liquids. Medicines  Ask your health care provider about: ? Changing or stopping your regular medicines. This is especially important if you are taking diabetes medicines or blood thinners. ? Taking medicines such as aspirin and ibuprofen. These medicines can thin your blood. Do not take these medicines before your procedure if your health care provider instructs you not to.  You may be given antibiotic medicine to help prevent infection. General instructions  You may have blood tests or imaging studies.  Ask your health care provider how your surgical site will be marked or identified.  If you smoke, do not smoke for at least 2 weeks before your procedure or for as long as told by your health care provider.  Let your health care provider know if you develop a cold or any infection before your surgery.  Plan to have someone take you home from the hospital or clinic.  If you will be going home right after the procedure, plan to have someone with you for 24 hours. What happens during the procedure?  To reduce your  risk of infection: ? Your health care team will wash or sanitize their hands. ? Your skin will be washed with soap. ? Hair may be removed from the surgical area.  An IV tube will be inserted into one of your veins.  You will be given one or more of the following: ? A medicine to help you relax (sedative). ? A medicine to numb the area (local  anesthetic). ? A medicine to make you fall asleep (general anesthetic).  Your surgeon will make an incision over the hernia.  The tissues of the hernia will be moved back into place.  The edges of the hernia may be stitched together.  The opening in the abdominal muscles will be closed with stitches (sutures). Or, your surgeon will place a mesh patch made of manmade (synthetic) material over the opening.  The incision will be closed.  A bandage (dressing) may be placed over the incision. The procedure may vary among health care providers and hospitals. What happens after the procedure?  Your blood pressure, heart rate, breathing rate, and blood oxygen level will be monitored until the medicines you were given have worn off.  You may be given medicine for pain.  Do not drive for 24 hours if you received a sedative. This information is not intended to replace advice given to you by your health care provider. Make sure you discuss any questions you have with your health care provider. Document Revised: 02/07/2017 Document Reviewed: 08/09/2015 Elsevier Patient Education  2020 [employer].

## 2019-06-07 NOTE — H&P (View-Only) (Signed)
06/07/2019  History of Present Illness: Grant [name] is a 64 y.o. male s/p laparoscopic bilateral inguinal hernia repair on 05/13/2018.  Patient presents today because he has a recurrence of his hernia on the right groin.  The patient reports that shortly after surgery, he lifted something heavy by mistake.  He felt discomfort in the right groin afterwards.  He's unsure as to when exactly he felt a bulge come up again, but more recently, it's been causing more discomfort.  Denies any urinary of GI difficulties.  His pain is mostly in the right groin, and does not radiate.  Denies any issues with the left groin or the laparoscopic incisions.  Past Medical History: Past Medical History:  Diagnosis Date  . Acid reflux   . Allergy   . Anemia   . Anxiety   . History of hiatal hernia   . Hypertension   . Insomnia   . RLS (restless legs syndrome)   . Sensorineural hearing loss   . Von Willebrand disease ([place])    PT STATES THIS IS MILD AND HAS NEVER HAD TO SEE HEMATOLOGIST-PT DID SAY THAT WHEN [place] YE[place] AGO HE BLED ALOT BUT IT WAS DUE TO NOT FOLLOWING POST OP INSTRUCTIONS      Past Surgical History: Past Surgical History:  Procedure Laterality Date  . CIRCUMCISION     AS AN ADULT  . COLONOSCOPY    . FOOT SURGERY Right   . INCISION AND DRAINAGE Right 02/04/2013   Procedure: INCISION AND DRAINAGE;  Surgeon: Linna Hoff, MD;  Location: [place];  Service: Orthopedics;  Laterality: Right;  . INGUINAL HERNIA REPAIR Bilateral 05/13/2018   Procedure: LAPAROSCOPIC BILATERAL INGUINAL HERNIA REPAIR;  Surgeon: Olean Ree, MD;  Location: [facility];  Service: General;  Laterality: Bilateral;  . OPEN REDUCTION INTERNAL FIXATION (ORIF) FINGER WITH RADIAL BONE GRAFT Right 02/04/2013   Procedure: OPEN REDUCTION INTERNAL FIXATION (ORIF) right ring and small fingers;  Surgeon: Linna Hoff, MD;  Location: [place];  Service: Orthopedics;  Laterality: Right;    Home  Medications: Prior to Admission medications   Medication Sig Start Date End Date Taking? Authorizing Provider  amLODipine (NORVASC) 10 MG tablet Take 1 tablet (10 mg total) by mouth daily. 04/20/19   Susy Frizzle, MD  EPINEPHRINE 0.3 mg/0.3 mL IJ SOAJ injection INJECT 0.3 ML (0.3 MG) INTO THE MUSCLE ONCE FOR 1 DOSE 03/15/19   Susy Frizzle, MD  fluticasone [facility]) 50 MCG/ACT nasal spray Place 2 sprays into both nostrils daily. 04/29/19   [provider]  losartan (COZAAR) 50 MG tablet Take 1 tablet (50 mg total) by mouth daily. 09/15/18   Susy Frizzle, MD  mometasone (ELOCON) 0.1 % cream Apply 1 application topically daily. 04/20/19   Susy Frizzle, MD  pantoprazole (PROTONIX) 40 MG tablet TAKE 1 TABLET DAILY 05/28/19   Susy Frizzle, MD  rOPINIRole (REQUIP XL) 2 MG 24 hr tablet Take 1 tablet twice a day 05/15/19   Sater, Nanine Means, MD    Allergies: Allergies  Allergen Reactions  . Bee Venom Anaphylaxis, Swelling and Rash  . Asa [Aspirin] Other (See Comments)    Gi upset    Review of Systems: Review of Systems  Constitutional: Negative for chills and fever.  Respiratory: Negative for shortness of breath.   Cardiovascular: Negative for chest pain.  Gastrointestinal: Positive for abdominal pain. Negative for constipation, diarrhea, nausea and vomiting.  Genitourinary: Negative for dysuria.  Musculoskeletal: Negative for myalgias.    Physical Exam BP 137/71   Pulse 67   Temp 98.2 F (36.8 C) (Temporal)   Resp 12   Ht 5\' 8"  (1.727 m)   Wt 180 lb (81.6 kg)   SpO2 97%   BMI 27.37 kg/m  CONSTITUTIONAL: No acute distress HEENT:  Normocephalic, atraumatic, extraocular motion intact. RESPIRATORY:  Lungs are clear, and breath sounds are equal bilaterally. Normal respiratory effort without pathologic use of accessory muscles. CARDIOVASCULAR: Heart is regular without murmurs, gallops, or rubs. GI: The abdomen is soft, non-distended, non-tender.  Patient has a  recurrent right inguinal hernia, which is reducible.  No significant tenderness on manipulation.  No evidence of hernia on the left, or at the umbilicus. NEUROLOGIC:  Motor and sensation is grossly normal.  Cranial nerves are grossly intact. PSYCH:  Alert and oriented to person, place and time. Affect is normal.  Labs/Imaging: None recently  Assessment and Plan: This is a 64 y.o. male with a recurrent right inguinal hernia  --Discussed with the patient that the recurrence could have been from the heavy lifting shortly after surgery, but could also have been a technical issue.  At this point, it would be unclear.  However, this is something that can be repaired. --Discussed with him that the surgical approach would be via open surgery, since it is unscarred territory, compared to trying to do this laparoscopically again or robotically.  Discussed post-op recovery, activity restrictions, risks of bleeding, infection, and injury to surrounding structures, and he's willing to proceed. --Will schedule him for 06/15/19.  He understands he would need to get COVID test prior to surgery.  Will send medical clearance form to his PCP.  Face-to-face time spent with the patient and care providers was 25 minutes, with more than 50% of the time spent counseling, educating, and coordinating care of the patient.     Melvyn Neth, [facility]

## 2019-06-09 ENCOUNTER — Telehealth: Payer: Self-pay | Admitting: Surgery

## 2019-06-09 NOTE — Telephone Encounter (Signed)
Pt has been advised of Pre-Admission date/time, COVID Testing date and Surgery date.  Surgery Date: 06/17/19 Preadmission Testing Date: 06/10/19 (phone 8a-1p) Covid Testing Date: 06/15/19 - patient advised to go to the [facility] ([place]) between 8a-1p  Patient has been made aware to call [phone], between 1-3:00pm the day before surgery, to find out what time to arrive for surgery.

## 2019-06-10 ENCOUNTER — Telehealth: Payer: Self-pay | Admitting: Emergency Medicine

## 2019-06-10 ENCOUNTER — Other Ambulatory Visit: Payer: Self-pay

## 2019-06-10 ENCOUNTER — Encounter
Admission: RE | Admit: 2019-06-10 | Discharge: 2019-06-10 | Disposition: A | Discharge status: Home / Self Care | Payer: Medicare Other | Source: Ambulatory Visit | Attending: Surgery | Admitting: Surgery

## 2019-06-10 NOTE — Telephone Encounter (Signed)
Medical Clearance received from Dr Jenna Luo at this time. Pt is optimized for surgery at low risk.

## 2019-06-10 NOTE — Patient Instructions (Addendum)
Your procedure is scheduled on: Thursday, April 8 Report to Day Surgery on the 2nd floor of the [facility]. To find out your arrival time, please call [phone] between 1PM - 3PM on: Wednesday, April 7  REMEMBER: Instructions that are not followed completely may result in serious medical risk, up to and including death; or upon the discretion of your surgeon and anesthesiologist your surgery may need to be rescheduled.  Do not eat food after midnight the night before surgery.  No gum chewing, lozengers or hard candies.  You may however, drink CLEAR liquids up to 2 hours before you are scheduled to arrive for your surgery. Do not drink anything within 2 hours of the start of your surgery.  Clear liquids include: - water  - apple juice without pulp - gatorade (not RED) - black coffee or tea (Do NOT add milk or creamers to the coffee or tea) Do NOT drink anything that is not on this list.  TAKE THESE MEDICATIONS THE MORNING OF SURGERY WITH A SIP OF WATER:  1.  Requip 2.  flonase nasal spray 3.  Pantoprazole - (take one the night before and one on the morning of surgery - helps to prevent nausea after surgery.)  Stop Anti-inflammatories (NSAIDS) such as Advil, Aleve, Ibuprofen, Motrin, Naproxen, Naprosyn and Aspirin based products such as Excedrin, Goodys Powder, BC Powder. (May take Tylenol or Acetaminophen if needed.)  Stop ANY OVER THE COUNTER supplements until after surgery.  No Alcohol for 24 hours before or after surgery.  No Smoking including e-cigarettes for 24 hours prior to surgery.  No chewable tobacco products for at least 6 hours prior to surgery.  No nicotine patches on the day of surgery.  On the morning of surgery brush your teeth with toothpaste and water, you may rinse your mouth with mouthwash if you wish. Do not swallow any toothpaste or mouthwash.  Do not wear jewelry.  Do not wear lotions, powders, or perfumes.   Do not shave 48 hours prior to  surgery.   Contact lenses, hearing aids and dentures may not be worn into surgery.  Do not bring valuables to the hospital, including drivers license, insurance or credit cards.   is not responsible for any belongings or valuables.   Use CHG Soap as directed on instruction sheet.  Notify your doctor if there is any change in your medical condition (cold, fever, infection).  Wear comfortable clothing (specific to your surgery type) to the hospital.  Plan for stool softeners for home use.  If you are being discharged the day of surgery, you will not be allowed to drive home. You will need a responsible adult to drive you home and stay with you that night.   If you are taking public transportation, you will need to have a responsible adult with you. Please confirm with your physician that it is acceptable to use public transportation.   Please call [phone] if you have any questions about these instructions.  Visitation Policy:  Patients undergoing a surgery or procedure in a hospital may have one family member or support person with them as long as that person is not COVID-19 positive or experiencing its symptoms. That person may remain in the waiting area during the procedure. Should the patient need to stay at the hospital during part of their recovery, the support person may visit during visiting hours; 7 am to 8 pm.

## 2019-06-11 ENCOUNTER — Encounter
Admission: RE | Admit: 2019-06-11 | Discharge: 2019-06-11 | Disposition: A | Discharge status: Home / Self Care | Payer: Medicare Other | Source: Ambulatory Visit | Attending: Surgery | Admitting: Surgery

## 2019-06-11 DIAGNOSIS — Z01818 Encounter for other preprocedural examination: Secondary | ICD-10-CM | POA: Insufficient documentation

## 2019-06-11 LAB — BASIC METABOLIC PANEL
Anion gap: 7 (ref 5–15)
BUN: 16 mg/dL (ref 8–23)
CO2: 28 mmol/L (ref 22–32)
Calcium: 9 mg/dL (ref 8.9–10.3)
Chloride: 106 mmol/L (ref 98–111)
Creatinine, Ser: 0.84 mg/dL (ref 0.61–1.24)
GFR calc Af Amer: >60 mL/min (ref >60)
GFR calc non Af Amer: >60 mL/min (ref >60)
Glucose, Bld: 126 mg/dL — ABNORMAL HIGH (ref 70–99)
Potassium: 3.2 mmol/L — ABNORMAL LOW (ref 3.5–5.1)
Sodium: 141 mmol/L (ref 135–145)

## 2019-06-11 LAB — CBC
HCT: 41.5 % (ref 39.0–52.0)
Hemoglobin: 14.2 g/dL (ref 13.0–17.0)
MCH: 31.1 pg (ref 26.0–34.0)
MCHC: 34.2 g/dL (ref 30.0–36.0)
MCV: 90.8 fL (ref 80.0–100.0)
Platelets: 247 10*3/uL (ref 150–400)
RBC: 4.57 MIL/uL (ref 4.22–5.81)
RDW: 12 % (ref 11.5–15.5)
WBC: 9.9 10*3/uL (ref 4.0–10.5)
nRBC: 0 % (ref 0.0–0.2)

## 2019-06-11 NOTE — Pre-Procedure Instructions (Signed)
Pre-Admit Testing Provider Notification Note  Provider Notified: Dr. Hampton Abbot  Notification Mode: Fax  Reason: Abnormal Labs  Response: Fax confirmation received.  Additional Information: Placed on chart Noted on Pre-admit Worksheet.  Signed: Beulah Gandy, RN

## 2019-06-12 ENCOUNTER — Encounter: Payer: Self-pay | Admitting: Family Medicine

## 2019-06-14 ENCOUNTER — Telehealth: Payer: Self-pay

## 2019-06-14 MED ORDER — POTASSIUM CHLORIDE CRYS ER 20 MEQ PO TBCR: 20.0000 meq | EXTENDED_RELEASE_TABLET | Freq: Two times a day (BID) | ORAL | 0 refills | Status: DC

## 2019-06-14 NOTE — Telephone Encounter (Signed)
Spoke with the patient and let him know that his Potassium was a little low and that we were sending in a Potassium supplement to his pharmacy. Patient to take Potassium 20 meq BID for 5 days. Patient is aware and will start this.

## 2019-06-15 ENCOUNTER — Other Ambulatory Visit
Admission: RE | Admit: 2019-06-15 | Discharge: 2019-06-15 | Disposition: A | Discharge status: Home / Self Care | Payer: Medicare Other | Source: Ambulatory Visit | Attending: Surgery | Admitting: Surgery

## 2019-06-15 ENCOUNTER — Other Ambulatory Visit: Payer: Self-pay

## 2019-06-15 DIAGNOSIS — Z01812 Encounter for preprocedural laboratory examination: Secondary | ICD-10-CM | POA: Insufficient documentation

## 2019-06-15 DIAGNOSIS — Z20822 Contact with and (suspected) exposure to covid-19: Secondary | ICD-10-CM | POA: Insufficient documentation

## 2019-06-15 LAB — SARS CORONAVIRUS 2 (TAT 6-24 HRS): SARS Coronavirus 2: NEGATIVE

## 2019-06-16 ENCOUNTER — Other Ambulatory Visit: Payer: Self-pay | Admitting: Surgery

## 2019-06-17 ENCOUNTER — Ambulatory Visit: Payer: Medicare Other | Admitting: Anesthesiology

## 2019-06-17 ENCOUNTER — Encounter
Admission: RE | Disposition: A | Discharge status: Home / Self Care | Payer: Self-pay | Source: Home / Self Care | Attending: Surgery

## 2019-06-17 ENCOUNTER — Ambulatory Visit
Admission: RE | Admit: 2019-06-17 | Discharge: 2019-06-17 | Disposition: A | Discharge status: Home / Self Care | Payer: Medicare Other | Attending: Surgery | Admitting: Surgery

## 2019-06-17 ENCOUNTER — Other Ambulatory Visit: Payer: Self-pay

## 2019-06-17 ENCOUNTER — Encounter: Payer: Self-pay | Admitting: Surgery

## 2019-06-17 DIAGNOSIS — Z87891 Personal history of nicotine dependence: Secondary | ICD-10-CM | POA: Insufficient documentation

## 2019-06-17 DIAGNOSIS — K219 Gastro-esophageal reflux disease without esophagitis: Secondary | ICD-10-CM | POA: Diagnosis not present

## 2019-06-17 DIAGNOSIS — K4091 Unilateral inguinal hernia, without obstruction or gangrene, recurrent: Secondary | ICD-10-CM

## 2019-06-17 DIAGNOSIS — Z79899 Other long term (current) drug therapy: Secondary | ICD-10-CM | POA: Diagnosis not present

## 2019-06-17 DIAGNOSIS — Z886 Allergy status to analgesic agent status: Secondary | ICD-10-CM | POA: Insufficient documentation

## 2019-06-17 DIAGNOSIS — I10 Essential (primary) hypertension: Secondary | ICD-10-CM | POA: Insufficient documentation

## 2019-06-17 HISTORY — PX: INGUINAL HERNIA REPAIR: SHX194

## 2019-06-17 LAB — URINE DRUG SCREEN, QUALITATIVE (ARMC ONLY)
Amphetamines, Ur Screen: NOT DETECTED
Barbiturates, Ur Screen: NOT DETECTED
Benzodiazepine, Ur Scrn: NOT DETECTED
Cannabinoid 50 Ng, Ur ~~LOC~~: POSITIVE — AB
Cocaine Metabolite,Ur ~~LOC~~: NOT DETECTED
MDMA (Ecstasy)Ur Screen: NOT DETECTED
Methadone Scn, Ur: NOT DETECTED
Opiate, Ur Screen: NOT DETECTED
Phencyclidine (PCP) Ur S: NOT DETECTED
Tricyclic, Ur Screen: NOT DETECTED

## 2019-06-17 LAB — POCT I-STAT, CHEM 8
BUN: 16 mg/dL (ref 8–23)
Calcium, Ion: 1.19 mmol/L (ref 1.15–1.40)
Chloride: 104 mmol/L (ref 98–111)
Creatinine, Ser: 0.8 mg/dL (ref 0.61–1.24)
Glucose, Bld: 99 mg/dL (ref 70–99)
HCT: 36 % — ABNORMAL LOW (ref 39.0–52.0)
Hemoglobin: 12.2 g/dL — ABNORMAL LOW (ref 13.0–17.0)
Potassium: 4 mmol/L (ref 3.5–5.1)
Sodium: 138 mmol/L (ref 135–145)
TCO2: 25 mmol/L (ref 22–32)

## 2019-06-17 SURGERY — REPAIR, HERNIA, INGUINAL, ADULT
Anesthesia: General | Site: Inguinal | Laterality: Right

## 2019-06-17 MED ORDER — FENTANYL CITRATE (PF) 100 MCG/2ML IJ SOLN: 25.0000 ug | INTRAMUSCULAR | Status: DC | PRN

## 2019-06-17 MED ORDER — SUGAMMADEX SODIUM 500 MG/5ML IV SOLN
INTRAVENOUS | Status: DC | PRN
  Administered 2019-06-17: 300 mg via INTRAVENOUS

## 2019-06-17 MED ORDER — LIDOCAINE HCL (CARDIAC) PF 100 MG/5ML IV SOSY
PREFILLED_SYRINGE | INTRAVENOUS | Status: DC | PRN
  Administered 2019-06-17: 100 mg via INTRAVENOUS

## 2019-06-17 MED ORDER — GABAPENTIN 300 MG PO CAPS
ORAL_CAPSULE | ORAL | Status: AC
  Administered 2019-06-17: 09:00:00 300 mg via ORAL
  Filled 2019-06-17: qty 1

## 2019-06-17 MED ORDER — FENTANYL CITRATE (PF) 100 MCG/2ML IJ SOLN
INTRAMUSCULAR | Status: DC | PRN
  Administered 2019-06-17: 100 ug via INTRAVENOUS

## 2019-06-17 MED ORDER — EPINEPHRINE PF 1 MG/ML IJ SOLN
INTRAMUSCULAR | Status: AC
  Filled 2019-06-17: qty 1

## 2019-06-17 MED ORDER — ACETAMINOPHEN 500 MG PO TABS
ORAL_TABLET | ORAL | Status: AC
  Administered 2019-06-17: 1000 mg via ORAL
  Filled 2019-06-17: qty 2

## 2019-06-17 MED ORDER — BUPIVACAINE LIPOSOME 1.3 % IJ SUSP
INTRAMUSCULAR | Status: DC | PRN
  Administered 2019-06-17: 20 mL

## 2019-06-17 MED ORDER — SUCCINYLCHOLINE CHLORIDE 20 MG/ML IJ SOLN
INTRAMUSCULAR | Status: DC | PRN
  Administered 2019-06-17: 100 mg via INTRAVENOUS

## 2019-06-17 MED ORDER — CHLORHEXIDINE GLUCONATE CLOTH 2 % EX PADS: 6.0000 | MEDICATED_PAD | Freq: Once | CUTANEOUS | Status: DC

## 2019-06-17 MED ORDER — BUPIVACAINE LIPOSOME 1.3 % IJ SUSP
INTRAMUSCULAR | Status: AC
  Filled 2019-06-17: qty 20

## 2019-06-17 MED ORDER — LACTATED RINGERS IV SOLN: INTRAVENOUS | Status: DC | PRN

## 2019-06-17 MED ORDER — KETOROLAC TROMETHAMINE 30 MG/ML IJ SOLN
INTRAMUSCULAR | Status: DC | PRN
  Administered 2019-06-17: 30 mg via INTRAVENOUS

## 2019-06-17 MED ORDER — GABAPENTIN 300 MG PO CAPS: 300.0000 mg | ORAL_CAPSULE | ORAL | Status: AC

## 2019-06-17 MED ORDER — EPHEDRINE SULFATE 50 MG/ML IJ SOLN
INTRAMUSCULAR | Status: DC | PRN
  Administered 2019-06-17: 10 mg via INTRAVENOUS

## 2019-06-17 MED ORDER — BUPIVACAINE-EPINEPHRINE (PF) 0.25% -1:200000 IJ SOLN
INTRAMUSCULAR | Status: DC | PRN
  Administered 2019-06-17: 30 mL

## 2019-06-17 MED ORDER — CEFAZOLIN SODIUM-DEXTROSE 2-4 GM/100ML-% IV SOLN
2.0000 g | INTRAVENOUS | Status: AC
  Administered 2019-06-17: 11:00:00 2 g via INTRAVENOUS

## 2019-06-17 MED ORDER — DIPHENHYDRAMINE HCL 50 MG/ML IJ SOLN
INTRAMUSCULAR | Status: DC | PRN
  Administered 2019-06-17: 12.5 mg via INTRAVENOUS

## 2019-06-17 MED ORDER — GLYCOPYRROLATE 0.2 MG/ML IJ SOLN
INTRAMUSCULAR | Status: DC | PRN
  Administered 2019-06-17: .2 mg via INTRAVENOUS

## 2019-06-17 MED ORDER — LACTATED RINGERS IV SOLN
INTRAVENOUS | Status: DC
Start: 2019-06-17 — End: 2019-06-17

## 2019-06-17 MED ORDER — ONDANSETRON HCL 4 MG/2ML IJ SOLN: 4.0000 mg | Freq: Once | INTRAMUSCULAR | Status: DC | PRN

## 2019-06-17 MED ORDER — MIDAZOLAM HCL 2 MG/2ML IJ SOLN
INTRAMUSCULAR | Status: AC
  Filled 2019-06-17: qty 2

## 2019-06-17 MED ORDER — ROCURONIUM BROMIDE 100 MG/10ML IV SOLN
INTRAVENOUS | Status: DC | PRN
  Administered 2019-06-17: 35 mg via INTRAVENOUS
  Administered 2019-06-17: 5 mg via INTRAVENOUS

## 2019-06-17 MED ORDER — FENTANYL CITRATE (PF) 100 MCG/2ML IJ SOLN
INTRAMUSCULAR | Status: AC
  Filled 2019-06-17: qty 2

## 2019-06-17 MED ORDER — ACETAMINOPHEN 500 MG PO TABS: 1000.0000 mg | ORAL_TABLET | ORAL | Status: AC

## 2019-06-17 MED ORDER — BUPIVACAINE LIPOSOME 1.3 % IJ SUSP: 20.0000 mL | Freq: Once | INTRAMUSCULAR | Status: DC

## 2019-06-17 MED ORDER — DEXAMETHASONE SODIUM PHOSPHATE 10 MG/ML IJ SOLN
INTRAMUSCULAR | Status: DC | PRN
  Administered 2019-06-17: 10 mg via INTRAVENOUS

## 2019-06-17 MED ORDER — OXYCODONE HCL 5 MG PO TABS: 5.0000 mg | ORAL_TABLET | ORAL | 0 refills | Status: DC | PRN

## 2019-06-17 MED ORDER — MIDAZOLAM HCL 2 MG/2ML IJ SOLN
INTRAMUSCULAR | Status: DC | PRN
  Administered 2019-06-17: 2 mg via INTRAVENOUS

## 2019-06-17 MED ORDER — PROPOFOL 10 MG/ML IV BOLUS
INTRAVENOUS | Status: DC | PRN
  Administered 2019-06-17: 110 mg via INTRAVENOUS
  Administered 2019-06-17: 20 mg via INTRAVENOUS

## 2019-06-17 MED ORDER — BUPIVACAINE HCL (PF) 0.25 % IJ SOLN
INTRAMUSCULAR | Status: AC
  Filled 2019-06-17: qty 30

## 2019-06-17 MED ORDER — IBUPROFEN 600 MG PO TABS: 600.0000 mg | ORAL_TABLET | Freq: Three times a day (TID) | ORAL | 1 refills | Status: DC | PRN

## 2019-06-17 MED ORDER — CEFAZOLIN SODIUM-DEXTROSE 2-4 GM/100ML-% IV SOLN
INTRAVENOUS | Status: AC
  Filled 2019-06-17: qty 100

## 2019-06-17 MED ORDER — ONDANSETRON HCL 4 MG/2ML IJ SOLN
INTRAMUSCULAR | Status: DC | PRN
  Administered 2019-06-17: 4 mg via INTRAVENOUS

## 2019-06-17 SURGICAL SUPPLY — 37 items
ADH SKN CLS APL DERMABOND .7 (GAUZE/BANDAGES/DRESSINGS) ×1
APL PRP STRL LF DISP 70% ISPRP (MISCELLANEOUS) ×1
BLADE SURG 15 STRL LF DISP TIS (BLADE) ×1 IMPLANT
BLADE SURG 15 STRL SS (BLADE) ×2
CANISTER SUCT 1200ML W/VALVE (MISCELLANEOUS) ×2 IMPLANT
CHLORAPREP W/TINT 26 (MISCELLANEOUS) ×2 IMPLANT
COVER WAND RF STERILE (DRAPES) ×2 IMPLANT
DERMABOND ADVANCED (GAUZE/BANDAGES/DRESSINGS) ×1
DERMABOND ADVANCED .7 DNX12 (GAUZE/BANDAGES/DRESSINGS) ×1 IMPLANT
DRAIN PENROSE 1/4X12 LTX STRL (WOUND CARE) ×2 IMPLANT
DRAPE LAPAROTOMY 100X77 ABD (DRAPES) ×2 IMPLANT
ELECT CAUTERY BLADE 6.4 (BLADE) ×2 IMPLANT
ELECT REM PT RETURN 9FT ADLT (ELECTROSURGICAL) ×2
ELECTRODE REM PT RTRN 9FT ADLT (ELECTROSURGICAL) ×1 IMPLANT
GLOVE SURG SYN 7.0 (GLOVE) ×2 IMPLANT
GLOVE SURG SYN 7.0 PF PI (GLOVE) ×1 IMPLANT
GLOVE SURG SYN 7.5  E (GLOVE) ×2
GLOVE SURG SYN 7.5 E (GLOVE) ×1 IMPLANT
GLOVE SURG SYN 7.5 PF PI (GLOVE) ×1 IMPLANT
GOWN STRL REUS W/ TWL LRG LVL3 (GOWN DISPOSABLE) ×2 IMPLANT
GOWN STRL REUS W/TWL LRG LVL3 (GOWN DISPOSABLE) ×4
LABEL OR SOLS (LABEL) ×2 IMPLANT
MESH MARLEX PLUG MEDIUM (Mesh General) ×2 IMPLANT
NEEDLE HYPO 22GX1.5 SAFETY (NEEDLE) ×2 IMPLANT
NS IRRIG 500ML POUR BTL (IV SOLUTION) ×2 IMPLANT
PACK BASIN MINOR ARMC (MISCELLANEOUS) ×2 IMPLANT
SPONGE LAP 18X18 RF (DISPOSABLE) ×2 IMPLANT
SUT MNCRL 4-0 (SUTURE) ×2
SUT MNCRL 4-0 27XMFL (SUTURE) ×1
SUT PROLENE 2 0 SH DA (SUTURE) ×4 IMPLANT
SUT VIC AB 2-0 CT1 (SUTURE) ×2 IMPLANT
SUT VIC AB 3-0 SH 27 (SUTURE) ×2
SUT VIC AB 3-0 SH 27X BRD (SUTURE) ×1 IMPLANT
SUTURE MNCRL 4-0 27XMF (SUTURE) ×1 IMPLANT
SYR 10ML LL (SYRINGE) ×2 IMPLANT
SYR 30ML LL (SYRINGE) ×2 IMPLANT
SYR BULB IRRIG 60ML STRL (SYRINGE) ×2 IMPLANT

## 2019-06-17 NOTE — Interval H&P Note (Signed)
History and Physical Interval Note:  06/17/2019 10:31 AM  Grant [name]  has presented today for surgery, with the diagnosis of recurrent right inguinal hernia.  The various methods of treatment have been discussed with the patient and family. After consideration of risks, benefits and other options for treatment, the patient has consented to  Procedure(s): HERNIA REPAIR INGUINAL ADULT -- Recurrent (Right) as a surgical intervention.  The patient's history has been reviewed, patient examined, no change in status, stable for surgery.  I have reviewed the patient's chart and labs.  Questions were answered to the patient's satisfaction.     [name]

## 2019-06-17 NOTE — Transfer of Care (Signed)
Immediate Anesthesia Transfer of Care Note  Patient: [name]  Procedure(s) Performed: HERNIA REPAIR INGUINAL ADULT WITH MESH-- Recurrent (Right Inguinal)  Patient Location: PACU  Anesthesia Type:General  Level of Consciousness: awake, alert , oriented and patient cooperative  Airway & Oxygen Therapy: Patient Spontanous Breathing and Patient connected to face mask oxygen  Post-op Assessment: Report given to RN and Post -op Vital signs reviewed and stable  Post vital signs: Reviewed and stable  Last Vitals:  Vitals Value Taken Time  BP 129/73 06/17/19 1230  Temp 36.7 C 06/17/19 1230  Pulse 89 06/17/19 1232  Resp 21 06/17/19 1232  SpO2 99 % 06/17/19 1232  Vitals shown include unvalidated device data.  Last Pain:  Vitals:   06/17/19 1230  TempSrc:   PainSc: (P) Asleep         Complications: No apparent anesthesia complications

## 2019-06-17 NOTE — Anesthesia Postprocedure Evaluation (Signed)
Anesthesia Post Note  Patient: Grant [name]  Procedure(s) Performed: HERNIA REPAIR INGUINAL ADULT WITH MESH-- Recurrent (Right Inguinal)  Patient location during evaluation: PACU Anesthesia Type: General Level of consciousness: awake and alert Pain management: pain level controlled Vital Signs Assessment: post-procedure vital signs reviewed and stable Respiratory status: spontaneous breathing, nonlabored ventilation, respiratory function stable and patient connected to nasal cannula oxygen Cardiovascular status: blood pressure returned to baseline and stable Postop Assessment: no apparent nausea or vomiting Anesthetic complications: no     Last Vitals:  Vitals:   06/17/19 1325 06/17/19 1344  BP: 138/74 (!) 125/56  Pulse: 68 62  Resp: 16 16  Temp: 36.4 C   SpO2: 93% 99%    Last Pain:  Vitals:   06/17/19 1344  TempSrc:   PainSc: 0-No pain                 Arita Miss

## 2019-06-17 NOTE — Anesthesia Preprocedure Evaluation (Signed)
Anesthesia Evaluation  Patient identified by MRN, date of birth, ID band Patient awake    Reviewed: Allergy & Precautions, H&P , NPO status , Patient's Chart, lab work & pertinent test results, reviewed documented beta blocker date and time   Airway Mallampati: II  TM Distance: >3 FB Neck ROM: Full    Dental  (+) Dental Advisory Given   Pulmonary former smoker,    breath sounds clear to auscultation       Cardiovascular hypertension, Pt. on home beta blockers and Pt. on medications  Rhythm:Regular Rate:Normal     Neuro/Psych Anxiety    GI/Hepatic Neg liver ROS, hiatal hernia, GERD  Medicated,  Endo/Other  negative endocrine ROS  Renal/GU negative Renal ROS  negative genitourinary   Musculoskeletal negative musculoskeletal ROS (+)   Abdominal   Peds negative pediatric ROS (+)  Hematology  (+) Blood dyscrasia, anemia ,   Anesthesia Other Findings Past Medical History: No date: Acid reflux No date: Allergy No date: Anemia No date: Anxiety No date: History of hiatal hernia No date: Hypertension No date: Insomnia No date: RLS (restless legs syndrome) No date: Sensorineural hearing loss No date: Von Willebrand disease ([place])     Comment:  PT STATES THIS IS MILD AND HAS NEVER HAD TO SEE               HEMATOLOGIST-PT DID SAY THAT WHEN HE WAS CIRCUMC[place]               [place] YE[place] AGO HE BLED ALOT BUT IT WAS DUE TO NOT               FOLLOWING POST OP INSTRUCTIONS   Reproductive/Obstetrics                             Anesthesia Physical  Anesthesia Plan  ASA: II  Anesthesia Plan: General   Post-op Pain Management:    Induction: Intravenous, Rapid sequence and Cricoid pressure planned  PONV Risk Score and Plan:   Airway Management Planned: Oral ETT  Additional Equipment:   Intra-op Plan:   Post-operative Plan: Extubation in OR  Informed Consent: I have reviewed the  patients History and Physical, chart, labs and discussed the procedure including the risks, benefits and alternatives for the proposed anesthesia with the patient or authorized representative who has indicated his/her understanding and acceptance.     Dental advisory given  Plan Discussed with: Surgeon and CRNA  Anesthesia Plan Comments:         Anesthesia Quick Evaluation

## 2019-06-17 NOTE — Anesthesia Procedure Notes (Signed)
Procedure Name: Intubation Performed by: Fletcher-Harrison, Nayel Purdy, CRNA Pre-anesthesia Checklist: Patient identified, Emergency Drugs available, Suction available and Patient being monitored Patient Re-evaluated:Patient Re-evaluated prior to induction Oxygen Delivery Method: Circle system utilized Preoxygenation: Pre-oxygenation with 100% oxygen Induction Type: IV induction Ventilation: Mask ventilation without difficulty Laryngoscope Size: McGraph and 3 Grade View: Grade I Tube type: Oral Tube size: 7.0 mm Number of attempts: 1 Airway Equipment and Method: Stylet Placement Confirmation: ETT inserted through vocal cords under direct vision,  positive ETCO2,  CO2 detector and breath sounds checked- equal and bilateral Secured at: 21 cm Tube secured with: Tape Dental Injury: Teeth and Oropharynx as per pre-operative assessment        

## 2019-06-17 NOTE — Op Note (Signed)
  Procedure Date:  06/17/2019  Pre-operative Diagnosis:  Recurrent right inguinal hernia  Post-operative Diagnosis:  Recurrent right inguinal hernia  Procedure:  Recurrent Right Inguinal Hernia Repair  Surgeon:  Melvyn Neth, MD  Anesthesia:  General endotracheal  Estimated Blood Loss:  10 ml  Specimens:  None  Complications:  None  Findings:  Patient had recurrence at the direct space.  Prior laparoscopic preperitoneal mesh edge could be felt in that area.  Indications for Procedure:  This is a 64 y.o. male who presents with a recurrent right inguinal hernia, s/p prior laparoscopic bilateral inguinal hernia repair a year ago.  The options of surgery versus observation were reviewed with the patient and/or family. The risks of bleeding, abscess or infection, recurrence of symptoms, potential for an open procedure, injury to surrounding structures, and chronic pain were all discussed with the patient and was willing to proceed.  Description of Procedure: The patient was correctly identified in the preoperative area and brought into the operating room.  The patient was placed supine with VTE prophylaxis in place.  Appropriate time-outs were performed.  Anesthesia was induced and the patient was intubated.  Appropriate antibiotics were infused.  The right groin and lower abdomen were prepped and draped in a sterile fashion. An oblique incision was made between the pubic symphysis extending laterally toward the ASIS. Using electrocautery, the subcutaneous tissues were dissected, assuring adequate hemostasis, until reaching the external oblique aponeurosis.  A 1 cm incision was made over the aponeurosis and extended laterally and medially toward the external inguinal ring avoiding injury to the ilioinguinal nerve.  The cord was encircled using a Penrose drain and using medial and lateral retraction, the cord structures were identified and dissected.  There was no indirect hernia sac.  However,  there was a hernia at the direct space, where a point of weakening could be felt with protrusion.  Upon palpating in this area, the edge of the prior mesh could be felt.  The direct space was closed / approximated using 2-0 Prolene sutures.   A Bard medium mesh was then placed and sutured to the pubic tubercle, shelving edge, and conjoined tendon with interrupted 2-0 Prolene sutures.  The tails of the mesh were crossed behind the cord and sutured together creating a new internal ring.  The external oblique was then closed in running fashion with 2-0 Vicryl, creating a new external ring.  The wound was irrigated and the incision was closed in three layers with 2-0 Vicryl, 3-0 Vicryl and running 4-0 Monocryl.  The wound was cleaned and sealed with DermaBond.  The patient was emerged from anesthesia and extubated and brought to the recovery room for further management.  The patient tolerated the procedure well and all counts were correct at the end of the case.   Melvyn Neth, MD

## 2019-06-17 NOTE — Discharge Instructions (Addendum)
Take Benadryl as needed for hives.  AMBULATORY SURGERY  DISCHARGE INSTRUCTIONS   1) The drugs that you were given will stay in your system until tomorrow so for the next 24 hours you should not:  A) Drive an automobile B) Make any legal decisions C) Drink any alcoholic beverage   2) You may resume regular meals tomorrow.  Today it is better to start with liquids and gradually work up to solid foods.  You may eat anything you prefer, but it is better to start with liquids, then soup and crackers, and gradually work up to solid foods.   3) Please notify your doctor immediately if you have any unusual bleeding, trouble breathing, redness and pain at the surgery site, drainage, fever, or pain not relieved by medication.    4) Additional Instructions:        Please contact your physician with any problems or Same Day Surgery at [phone], Monday through Friday 6 am to 4 pm, or [place] [facility] number at [phone].

## 2019-06-18 ENCOUNTER — Other Ambulatory Visit: Payer: Self-pay | Admitting: Surgery

## 2019-06-20 ENCOUNTER — Other Ambulatory Visit: Payer: Self-pay | Admitting: Surgery

## 2019-06-21 ENCOUNTER — Encounter: Payer: Self-pay | Admitting: Family Medicine

## 2019-06-22 ENCOUNTER — Other Ambulatory Visit: Payer: Self-pay | Admitting: Surgery

## 2019-06-23 ENCOUNTER — Other Ambulatory Visit: Payer: Self-pay

## 2019-06-24 ENCOUNTER — Encounter: Payer: Self-pay | Admitting: Family Medicine

## 2019-06-24 ENCOUNTER — Ambulatory Visit (INDEPENDENT_AMBULATORY_CARE_PROVIDER_SITE_OTHER): Payer: Medicare Other | Admitting: Family Medicine

## 2019-06-24 ENCOUNTER — Other Ambulatory Visit: Payer: Self-pay | Admitting: Surgery

## 2019-06-24 VITALS — BP 120/60 | HR 60 | Temp 97.5°F | Resp 14 | Ht 69.0 in | Wt 173.0 lb

## 2019-06-24 DIAGNOSIS — R6889 Other general symptoms and signs: Secondary | ICD-10-CM | POA: Diagnosis not present

## 2019-06-24 NOTE — Progress Notes (Signed)
Subjective:    Patient ID: Grant [name], male    DOB: [birth year], 64 y.o.   MRN: [id number] Patient's wife had Covid in December.  Roughly at the same time, the patient states that he had flulike symptoms.  These last a few days and then resolve spontaneously.  He would like to have lab work to see if he has had COVID-19 prior to receiving the vaccination.  I encouraged the patient to receive the Covid vaccine regardless of the lab work that I will be happy to check his lab work today if this provides the patient with any peace of mind.  Otherwise he is asymptomatic at the present time. Past Medical History:  Diagnosis Date  . Acid reflux   . Allergy   . Anemia   . Anxiety   . History of hiatal hernia   . Hypertension   . Insomnia   . RLS (restless legs syndrome)   . Sensorineural hearing loss   . Von Willebrand disease ([place])    PT STATES THIS IS MILD AND HAS NEVER HAD TO SEE HEMATOLOGIST-PT DID SAY THAT WHEN [place] AGO HE BLED ALOT BUT IT WAS DUE TO NOT FOLLOWING POST OP INSTRUCTIONS    Past Surgical History:  Procedure Laterality Date  . CIRCUMCISION     AS AN ADULT  . COLONOSCOPY    . FOOT SURGERY Right    arch support  . HERNIA REPAIR    . INCISION AND DRAINAGE Right 02/04/2013   Procedure: INCISION AND DRAINAGE;  Surgeon: Linna Hoff, MD;  Location: [facility];  Service: Orthopedics;  Laterality: Right;  . INGUINAL HERNIA REPAIR Bilateral 05/13/2018   Procedure: LAPAROSCOPIC BILATERAL INGUINAL HERNIA REPAIR;  Surgeon: Olean Ree, MD;  Location: [facility];  Service: General;  Laterality: Bilateral;  . INGUINAL HERNIA REPAIR Right 06/17/2019   Procedure: HERNIA REPAIR INGUINAL ADULT WITH MESH-- Recurrent;  Surgeon: Olean Ree, MD;  Location: [facility];  Service: General;  Laterality: Right;  . OPEN REDUCTION INTERNAL FIXATION (ORIF) FINGER WITH RADIAL BONE GRAFT Right 02/04/2013   Procedure: OPEN REDUCTION INTERNAL FIXATION (ORIF) right ring and  small fingers;  Surgeon: Linna Hoff, MD;  Location: [facility];  Service: Orthopedics;  Laterality: Right;   Current Outpatient Medications on File Prior to Visit  Medication Sig Dispense Refill  . amLODipine (NORVASC) 10 MG tablet Take 1 tablet (10 mg total) by mouth daily. 90 tablet 3  . EPINEPHRINE 0.3 mg/0.3 mL IJ SOAJ injection INJECT 0.3 ML (0.3 MG) INTO THE MUSCLE ONCE FOR 1 DOSE (Patient taking differently: Inject 0.3 mg into the muscle as needed for anaphylaxis. ) 2 each 11  . fluticasone (FLONASE) 50 MCG/ACT nasal spray Place 2 sprays into both nostrils daily.    Marland Kitchen ibuprofen (ADVIL) 600 MG tablet Take 1 tablet (600 mg total) by mouth every 8 (eight) hours as needed for mild pain or moderate pain. 30 tablet 1  . losartan (COZAAR) 50 MG tablet Take 1 tablet (50 mg total) by mouth daily. 90 tablet 3  . oxyCODONE (OXY IR/ROXICODONE) 5 MG immediate release tablet Take 1 tablet (5 mg total) by mouth every 4 (four) hours as needed for severe pain. 30 tablet 0  . pantoprazole (PROTONIX) 40 MG tablet TAKE 1 TABLET DAILY (Patient taking differently: Take 40 mg by mouth daily. ) 90 tablet 3  . potassium chloride SA (KLOR-CON) 20 MEQ tablet Take 1 tablet (20 mEq total) by mouth  2 (two) times daily for 5 days. 10 tablet 0  . rOPINIRole (REQUIP XL) 2 MG 24 hr tablet Take 1 tablet twice a day (Patient taking differently: Take 2 mg by mouth in the morning and at bedtime. ) 16 tablet 1   No current facility-administered medications on file prior to visit.   Allergies  Allergen Reactions  . Bee Venom Anaphylaxis, Swelling and Rash  . Asa [Aspirin] Other (See Comments)    Gi upset   Social History   Socioeconomic History  . Marital status: Married    Spouse name: Not on file  . Number of children: 4  . Years of education: 16  . Highest education level: Not on file  Occupational History  . Occupation: Sales executive   Tobacco Use  . Smoking status: Former Smoker    Packs/day: 0.50    Types:  Cigarettes    Quit date: 05/10/2016    Years since quitting: 3.1  . Smokeless tobacco: Never Used  Substance and Sexual Activity  . Alcohol use: No  . Drug use: Yes    Types: Marijuana    Comment: uses daily  . Sexual activity: Not on file  Other Topics Concern  . Not on file  Social History Narrative   Denies caffeine use    Social Determinants of Health   Financial Resource Strain:   . Difficulty of Paying Living Expenses:   Food Insecurity:   . Worried About Charity fundraiser in the Last Year:   . Arboriculturist in the Last Year:   Transportation Needs:   . Film/video editor (Medical):   Marland Kitchen Lack of Transportation (Non-Medical):   Physical Activity:   . Days of Exercise per Week:   . Minutes of Exercise per Session:   Stress:   . Feeling of Stress :   Social Connections:   . Frequency of Communication with Friends and Family:   . Frequency of Social Gatherings with Friends and Family:   . Attends Religious Services:   . Active Member of Clubs or Organizations:   . Attends Archivist Meetings:   Marland Kitchen Marital Status:   Intimate Partner Violence:   . Fear of Current or Ex-Partner:   . Emotionally Abused:   Marland Kitchen Physically Abused:   . Sexually Abused:    Family History  Problem Relation Age of Onset  . Hyperlipidemia Mother   . Hyperlipidemia Father   . Colon cancer Neg Hx   . Esophageal cancer Neg Hx   . Rectal cancer Neg Hx   . Stomach cancer Neg Hx        Review of Systems  All other systems reviewed and are negative.      Objective:   Physical Exam  Constitutional: He is oriented to person, place, and time. He appears well-developed and well-nourished. No distress.  Cardiovascular: Normal rate, regular rhythm, normal heart sounds and intact distal pulses. Exam reveals no gallop and no friction rub.  No murmur heard. Pulmonary/Chest: Effort normal and breath sounds normal. No respiratory distress. He has no wheezes. He has no rales. He  exhibits no tenderness.  Abdominal: Soft. Bowel sounds are normal. He exhibits no distension and no mass. There is no abdominal tenderness. There is no rebound and no guarding.  Neurological: He is alert and oriented to person, place, and time. He has normal reflexes. No cranial nerve deficit. He exhibits normal muscle tone. Coordination normal.  Skin: Skin is warm. He is not  diaphoretic.  Vitals reviewed.         Assessment & Plan:  Flu-like symptoms - Plan: SARS CoV2 Serology(COVID19) AB(IgG,IgM),Immunoassay  Patient had flulike symptoms earlier this winter.  I will check IgM, and IgG antibodies to COVID-19.  I did encourage the patient to receive the Covid vaccination regardless.

## 2019-06-25 LAB — SARS COV-2 SEROLOGY(COVID-19)AB(IGG,IGM),IMMUNOASSAY
SARS CoV-2 AB IgG: NEGATIVE
SARS CoV-2 IgM: NEGATIVE

## 2019-06-26 ENCOUNTER — Other Ambulatory Visit: Payer: Self-pay | Admitting: Surgery

## 2019-06-28 ENCOUNTER — Encounter: Payer: Self-pay | Admitting: Family Medicine

## 2019-06-28 ENCOUNTER — Other Ambulatory Visit: Payer: Self-pay

## 2019-06-28 ENCOUNTER — Other Ambulatory Visit: Payer: Self-pay | Admitting: Surgery

## 2019-06-28 ENCOUNTER — Ambulatory Visit (INDEPENDENT_AMBULATORY_CARE_PROVIDER_SITE_OTHER): Payer: Medicare Other | Admitting: Family Medicine

## 2019-06-28 VITALS — BP 110/60 | HR 60 | Temp 97.0°F | Resp 16 | Ht 69.0 in | Wt 173.0 lb

## 2019-06-28 DIAGNOSIS — S29012A Strain of muscle and tendon of back wall of thorax, initial encounter: Secondary | ICD-10-CM | POA: Diagnosis not present

## 2019-06-28 MED ORDER — TIZANIDINE HCL 4 MG PO TABS: 4.0000 mg | ORAL_TABLET | Freq: Four times a day (QID) | ORAL | 0 refills | Status: DC | PRN

## 2019-06-28 NOTE — Progress Notes (Signed)
Subjective:    Patient ID: Grant [name], male    DOB: 16-Nov-1955, 64 y.o.   MRN: [id number] Late last week and over the weekend, the patient developed pain in his upper back to the left of the spine.  The pain is located primarily around the level of T1 down to T12.  The patient reports a feeling of spasm or tightness in the muscle.  He denies any chest pain shortness of breath or dyspnea on exertion.  He denies any pleurisy.  There has been no leg swelling to suggest a DVT.  He also has pain with twisting and range of motion.  He denies any saddle anesthesia.  He denies any bowel or bladder incontinence.  He denies any leg numbness or leg weakness.  He is slightly tender to palpation over the thoracic paraspinal muscles. Past Medical History:  Diagnosis Date  . Acid reflux   . Allergy   . Anemia   . Anxiety   . History of hiatal hernia   . Hypertension   . Insomnia   . RLS (restless legs syndrome)   . Sensorineural hearing loss   . Von Willebrand disease ([place])    PT STATES THIS IS MILD AND HAS NEVER HAD TO SEE HEMATOLOGIST-PT DID SAY THAT WHEN HE WAS [place] AGO HE BLED ALOT BUT IT WAS DUE TO NOT FOLLOWING POST OP INSTRUCTIONS    Past Surgical History:  Procedure Laterality Date  . CIRCUMCISION     AS AN ADULT  . COLONOSCOPY    . FOOT SURGERY Right    arch support  . HERNIA REPAIR    . INCISION AND DRAINAGE Right 02/04/2013   Procedure: INCISION AND DRAINAGE;  Surgeon: Linna Hoff, MD;  Location: [facility];  Service: Orthopedics;  Laterality: Right;  . INGUINAL HERNIA REPAIR Bilateral 05/13/2018   Procedure: LAPAROSCOPIC BILATERAL INGUINAL HERNIA REPAIR;  Surgeon: Olean Ree, MD;  Location: [facility];  Service: General;  Laterality: Bilateral;  . INGUINAL HERNIA REPAIR Right 06/17/2019   Procedure: HERNIA REPAIR INGUINAL ADULT WITH MESH-- Recurrent;  Surgeon: Olean Ree, MD;  Location: [facility];  Service: General;  Laterality: Right;  . OPEN REDUCTION  INTERNAL FIXATION (ORIF) FINGER WITH RADIAL BONE GRAFT Right 02/04/2013   Procedure: OPEN REDUCTION INTERNAL FIXATION (ORIF) right ring and small fingers;  Surgeon: Linna Hoff, MD;  Location: [facility];  Service: Orthopedics;  Laterality: Right;   Current Outpatient Medications on File Prior to Visit  Medication Sig Dispense Refill  . amLODipine (NORVASC) 10 MG tablet Take 1 tablet (10 mg total) by mouth daily. 90 tablet 3  . EPINEPHRINE 0.3 mg/0.3 mL IJ SOAJ injection INJECT 0.3 ML (0.3 MG) INTO THE MUSCLE ONCE FOR 1 DOSE 2 each 11  . fluticasone (FLONASE) 50 MCG/ACT nasal spray Place 2 sprays into both nostrils daily.    Marland Kitchen ibuprofen (ADVIL) 600 MG tablet Take 1 tablet (600 mg total) by mouth every 8 (eight) hours as needed for mild pain or moderate pain. 30 tablet 1  . losartan (COZAAR) 50 MG tablet Take 1 tablet (50 mg total) by mouth daily. 90 tablet 3  . oxyCODONE (OXY IR/ROXICODONE) 5 MG immediate release tablet Take 1 tablet (5 mg total) by mouth every 4 (four) hours as needed for severe pain. 30 tablet 0  . pantoprazole (PROTONIX) 40 MG tablet TAKE 1 TABLET DAILY 90 tablet 3  . rOPINIRole (REQUIP XL) 2 MG 24 hr tablet Take 1 tablet  twice a day 16 tablet 1  . potassium chloride SA (KLOR-CON) 20 MEQ tablet Take 1 tablet (20 mEq total) by mouth 2 (two) times daily for 5 days. 10 tablet 0   No current facility-administered medications on file prior to visit.   Allergies  Allergen Reactions  . Bee Venom Anaphylaxis, Swelling and Rash  . Asa [Aspirin] Other (See Comments)    Gi upset   Social History   Socioeconomic History  . Marital status: Married    Spouse name: Not on file  . Number of children: 4  . Years of education: 19  . Highest education level: Not on file  Occupational History  . Occupation: Sales executive   Tobacco Use  . Smoking status: Former Smoker    Packs/day: 0.50    Types: Cigarettes    Quit date: 05/10/2016    Years since quitting: 3.1  . Smokeless tobacco:  Never Used  Substance and Sexual Activity  . Alcohol use: No  . Drug use: Yes    Types: Marijuana    Comment: uses daily  . Sexual activity: Not on file  Other Topics Concern  . Not on file  Social History Narrative   Denies caffeine use    Social Determinants of Health   Financial Resource Strain:   . Difficulty of Paying Living Expenses:   Food Insecurity:   . Worried About Charity fundraiser in the Last Year:   . Arboriculturist in the Last Year:   Transportation Needs:   . Film/video editor (Medical):   Marland Kitchen Lack of Transportation (Non-Medical):   Physical Activity:   . Days of Exercise per Week:   . Minutes of Exercise per Session:   Stress:   . Feeling of Stress :   Social Connections:   . Frequency of Communication with Friends and Family:   . Frequency of Social Gatherings with Friends and Family:   . Attends Religious Services:   . Active Member of Clubs or Organizations:   . Attends Archivist Meetings:   Marland Kitchen Marital Status:   Intimate Partner Violence:   . Fear of Current or Ex-Partner:   . Emotionally Abused:   Marland Kitchen Physically Abused:   . Sexually Abused:    Family History  Problem Relation Age of Onset  . Hyperlipidemia Mother   . Hyperlipidemia Father   . Colon cancer Neg Hx   . Esophageal cancer Neg Hx   . Rectal cancer Neg Hx   . Stomach cancer Neg Hx        Review of Systems  All other systems reviewed and are negative.      Objective:   Physical Exam  Constitutional: He appears well-developed and well-nourished. No distress.  Cardiovascular: Normal rate, regular rhythm, normal heart sounds and intact distal pulses. Exam reveals no gallop and no friction rub.  No murmur heard. Pulmonary/Chest: Effort normal and breath sounds normal. No respiratory distress. He has no wheezes. He has no rales. He exhibits no tenderness.  Abdominal: Soft. Bowel sounds are normal. He exhibits no distension and no mass. There is no abdominal  tenderness. There is no rebound and no guarding.  Musculoskeletal:     Thoracic back: Pain, spasms and tenderness present. No bony tenderness. Normal range of motion.       Back:  Skin: Skin is warm. He is not diaphoretic.  Vitals reviewed.         Assessment & Plan:  Strain of mid-back,  initial encounter  Begin Zanaflex 4 mg every 6 hours as needed.  Recheck if no better in 1 week or sooner if worse.  No evidence of a kidney infection.  He denies any dysuria or hematuria.  He denies any shortness of breath or pleurisy or hemoptysis.

## 2019-07-01 ENCOUNTER — Ambulatory Visit (INDEPENDENT_AMBULATORY_CARE_PROVIDER_SITE_OTHER): Payer: Medicare Other | Admitting: Family Medicine

## 2019-07-01 ENCOUNTER — Other Ambulatory Visit: Payer: Self-pay

## 2019-07-01 ENCOUNTER — Encounter: Payer: Self-pay | Admitting: Family Medicine

## 2019-07-01 ENCOUNTER — Encounter: Payer: Self-pay | Admitting: Surgery

## 2019-07-01 VITALS — BP 114/68 | HR 53 | Temp 97.6°F | Ht 69.0 in | Wt 175.8 lb

## 2019-07-01 DIAGNOSIS — G4761 Periodic limb movement disorder: Secondary | ICD-10-CM | POA: Diagnosis not present

## 2019-07-01 DIAGNOSIS — G2581 Restless legs syndrome: Secondary | ICD-10-CM

## 2019-07-01 NOTE — Progress Notes (Addendum)
PATIENT: Grant [name] DOB: [birth year]  REASON FOR VISIT: follow up HISTORY FROM: patient  Chief Complaint  Patient presents with  . Follow-up    RLS, rm 7, sx up and down     HISTORY OF PRESENT ILLNESS: Today 07/01/19 Grant STOGSDILL is a 64 y.o. male here today for follow up for RLS. Sleep study in 07/2016 showed normal AHI but severe PMLD. He was previously taking 13 tablets of 0.25mg  ropinirole throughout the day. We swithced to extended release in 10/2018. He reports taking ropinirole XL 2 mg twice daily for a couple of months. He continues to have leg twitching. He has tried multiple different dosing times.  Most recently, he has taken ropinirole ER 2 mg at bedtime as well as ropinirole IR 0.25 mg 1-2 times throughout the day.  He feels that this regimen has been most beneficial.  Total daily dosing of 2.5 mg.   HISTORY: (copied from my note on 04/01/2019)  Grant [name] is a 64 y.o. male here today for follow up. 3,3 2at 5, 2at 9 and 3 at bedtime . He continues to have trouble with restlessness and jerking of both legs. He is taking 3 tablets every morning, 3 at lunch, 2 around 5pm, 2 at 9pm and 3 at bedtime for a total of 13 tablets daily (3.25mg .) He has had dizziness for the past couple of years but does not feel it is related to ropinirole. No side effects noted after taking ropinirole. Has tried Lyrica and gabapentin in the past but reports that he did not feel well on these. He reports suicidal ideations with both.    HISTORY: (copied from [employer] note on 11/27/2017)  Grant [name] is a 64 year old right-handed gentleman with an underlying medical history of hypertension, reflux disease, allergies, and overweight state, who presents for follow up consultation of his restless legs and PLMS. The patient is unaccompanied today. I last saw him on 11/19/2016, at which time he was taking ropinirole 0.25 mg strength at 3 different times. He had recently suffered serious dog bites  that became infected and needed treatment for this as an inpatient. I suggested cautious increase in his Requip 0.25 mg strength 2 pills 3 times a day. He was advised regarding augmentation.   Today, 07/15/2017 (all dictated new, as well as above notes, some dictation done in note pad or Word, outside of chart, may appear as copied): He reportsfeeling about the same, maybe a little better. He takes requip 1 pill at 6 AM (2 pills was too much in AM), 1 to 2 at 4:30 and 2 at BT, which ranges from 10 PM to MN. Sometimes he has symptoms in his arms. Sx of RLS date back to several years ago. He may have tried gabapentin in the past for Back pain, not sure if he tolerated it. He would be willing to retry it.  UPDATE9/19/2019CMMr. Ronnald [name], 64 year old male returns for follow-up with history of restless leg syndrome.He claims that his restless legs are a little bit worse. He thinks his Toprol is making his restless legs worse so he has stopped the medication and wants [place] to make a suggestion however in reviewing side effects of Toprol restless legs is not a side effect. His blood pressure is elevated in the office today at 156/61. He was encouraged to follow-up with his primary care regarding his blood pressure. 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 His legs are more bothersome today because he had  to climb a ladder. He was started back on gabapentin after his last visit in May with Dr. Ferrel Logan patient states once he got to 300 mg he had suicidal idealizations and he cut it back to 100 mg. He has not had further suicidal idealzations on that dose.He currently takes Requip 0.25mg 3 tablets twice a day.He has a history of anemia in the past but his most recent CBC with hemoglobin of 13.5.He returns for reevaluation   REVIEW OF SYSTEMS: Out of a complete 14 system review of symptoms, the patient complains only of the following symptoms, restless legs, chronic back pain, and all  other reviewed systems are negative.  ALLERGIES: Allergies  Allergen Reactions  . Bee Venom Anaphylaxis, Swelling and Rash  . Asa [Aspirin] Other (See Comments)    Gi upset    HOME MEDICATIONS: Outpatient Medications Prior to Visit  Medication Sig Dispense Refill  . amLODipine (NORVASC) 10 MG tablet Take 1 tablet (10 mg total) by mouth daily. 90 tablet 3  . EPINEPHRINE 0.3 mg/0.3 mL IJ SOAJ injection INJECT 0.3 ML (0.3 MG) INTO THE MUSCLE ONCE FOR 1 DOSE 2 each 11  . fluticasone (FLONASE) 50 MCG/ACT nasal spray Place 2 sprays into both nostrils daily.    Marland Kitchen losartan (COZAAR) 50 MG tablet Take 1 tablet (50 mg total) by mouth daily. 90 tablet 3  . pantoprazole (PROTONIX) 40 MG tablet TAKE 1 TABLET DAILY 90 tablet 3  . rOPINIRole (REQUIP XL) 2 MG 24 hr tablet Take 1 tablet twice a day 16 tablet 1  . tiZANidine (ZANAFLEX) 4 MG tablet Take 1 tablet (4 mg total) by mouth every 6 (six) hours as needed for muscle spasms. 30 tablet 0  . ibuprofen (ADVIL) 600 MG tablet Take 1 tablet (600 mg total) by mouth every 8 (eight) hours as needed for mild pain or moderate pain. 30 tablet 1  . oxyCODONE (OXY IR/ROXICODONE) 5 MG immediate release tablet Take 1 tablet (5 mg total) by mouth every 4 (four) hours as needed for severe pain. 30 tablet 0  . potassium chloride SA (KLOR-CON) 20 MEQ tablet Take 1 tablet (20 mEq total) by mouth 2 (two) times daily for 5 days. 10 tablet 0   No facility-administered medications prior to visit.    PAST MEDICAL HISTORY: Past Medical History:  Diagnosis Date  . Acid reflux   . Allergy   . Anemia   . Anxiety   . History of hiatal hernia   . Hypertension   . Insomnia   . RLS (restless legs syndrome)   . Sensorineural hearing loss   . Von Willebrand disease ([place])    PT STATES THIS IS MILD AND HAS NEVER HAD TO SEE HEMATOLOGIST-PT DID SAY THAT WHEN [place] WAS [place] HE BLED ALOT BUT IT WAS DUE TO NOT FOLLOWING POST OP INSTRUCTIONS     PAST  SURGICAL HISTORY: Past Surgical History:  Procedure Laterality Date  . CIRCUMCISION     AS AN ADULT  . COLONOSCOPY    . FOOT SURGERY Right    arch support  . HERNIA REPAIR    . INCISION AND DRAINAGE Right 02/04/2013   Procedure: INCISION AND DRAINAGE;  Surgeon: Linna Hoff, MD;  Location: [facility];  Service: Orthopedics;  Laterality: Right;  . INGUINAL HERNIA REPAIR Bilateral 05/13/2018   Procedure: LAPAROSCOPIC BILATERAL INGUINAL HERNIA REPAIR;  Surgeon: Olean Ree, MD;  Location: [facility];  Service: General;  Laterality: Bilateral;  . INGUINAL HERNIA REPAIR  Right 06/17/2019   Procedure: HERNIA REPAIR INGUINAL ADULT WITH MESH-- Recurrent;  Surgeon: Olean Ree, MD;  Location: [facility];  Service: General;  Laterality: Right;  . OPEN REDUCTION INTERNAL FIXATION (ORIF) FINGER WITH RADIAL BONE GRAFT Right 02/04/2013   Procedure: OPEN REDUCTION INTERNAL FIXATION (ORIF) right ring and small fingers;  Surgeon: Linna Hoff, MD;  Location: [facility];  Service: Orthopedics;  Laterality: Right;    FAMILY HISTORY: Family History  Problem Relation Age of Onset  . Hyperlipidemia Mother   . Hyperlipidemia Father   . Colon cancer Neg Hx   . Esophageal cancer Neg Hx   . Rectal cancer Neg Hx   . Stomach cancer Neg Hx     SOCIAL HISTORY: Social History   Socioeconomic History  . Marital status: Married    Spouse name: Not on file  . Number of children: 4  . Years of education: 105  . Highest education level: Not on file  Occupational History  . Occupation: Sales executive   Tobacco Use  . Smoking status: Former Smoker    Packs/day: 0.50    Types: Cigarettes    Quit date: 05/10/2016    Years since quitting: 3.1  . Smokeless tobacco: Never Used  Substance and Sexual Activity  . Alcohol use: No  . Drug use: Yes    Types: Marijuana    Comment: uses daily  . Sexual activity: Not on file  Other Topics Concern  . Not on file  Social History Narrative   Denies caffeine use    Social  Determinants of Health   Financial Resource Strain:   . Difficulty of Paying Living Expenses:   Food Insecurity:   . Worried About Charity fundraiser in the Last Year:   . Arboriculturist in the Last Year:   Transportation Needs:   . Film/video editor (Medical):   Marland Kitchen Lack of Transportation (Non-Medical):   Physical Activity:   . Days of Exercise per Week:   . Minutes of Exercise per Session:   Stress:   . Feeling of Stress :   Social Connections:   . Frequency of Communication with Friends and Family:   . Frequency of Social Gatherings with Friends and Family:   . Attends Religious Services:   . Active Member of Clubs or Organizations:   . Attends Archivist Meetings:   Marland Kitchen Marital Status:   Intimate Partner Violence:   . Fear of Current or Ex-Partner:   . Emotionally Abused:   Marland Kitchen Physically Abused:   . Sexually Abused:       PHYSICAL EXAM  Vitals:   07/01/19 0848  BP: 114/68  Pulse: (!) 53  Temp: 97.6 F (36.4 C)  Weight: 175 lb 12.8 oz (79.7 kg)  Height: 5\' 9"  (1.753 m)   Body mass index is 25.96 kg/m.  Generalized: Well developed, in no acute distress  Cardiology: normal rate and rhythm, no murmur noted Respiratory: clear to auscultation bilaterally  Neurological examination  Mentation: Alert oriented to time, place, history taking. Follows all commands speech and language fluent Cranial nerve II-XII: Pupils were equal round reactive to light. Extraocular movements were full, visual field were full  Motor: The motor testing reveals 5 over 5 strength of all 4 extremities. Good symmetric motor tone is noted throughout.  Gait and station: Gait is normal.    DIAGNOSTIC DATA (LABS, IMAGING, TESTING) - I reviewed patient records, labs, notes, testing and imaging myself where available.  No  flowsheet data found.   Lab Results  Component Value Date   WBC 9.9 06/11/2019   HGB 12.2 (L) 06/17/2019   HCT 36.0 (L) 06/17/2019   MCV 90.8 06/11/2019    PLT 247 06/11/2019      Component Value Date/Time   NA 138 06/17/2019 0907   K 4.0 06/17/2019 0907   CL 104 06/17/2019 0907   CO2 28 06/11/2019 1214   GLUCOSE 99 06/17/2019 0907   BUN 16 06/17/2019 0907   CREATININE 0.80 06/17/2019 0907   CREATININE 0.87 04/03/2018 1124   CALCIUM 9.0 06/11/2019 1214   PROT 6.0 (L) 04/03/2018 1124   ALBUMIN 4.0 06/17/2016 0945   AST 22 04/03/2018 1124   ALT 20 04/03/2018 1124   ALKPHOS 92 06/17/2016 0945   BILITOT 0.6 04/03/2018 1124   GFRNONAA >60 06/11/2019 1214   GFRNONAA 92 04/03/2018 1124   GFRAA >60 06/11/2019 1214   GFRAA 106 04/03/2018 1124   Lab Results  Component Value Date   CHOL 162 09/15/2017   HDL 32 (L) 09/15/2017   LDLCALC 102 (H) 09/15/2017   TRIG 168 (H) 09/15/2017   CHOLHDL 5.1 (H) 09/15/2017   Lab Results  Component Value Date   HGBA1C 5.3 11/16/2013   Lab Results  Component Value Date   VITAMINB12 567 11/16/2013   Lab Results  Component Value Date   TSH 1.99 05/01/2006     ASSESSMENT AND PLAN 64 y.o. year old male  has a past medical history of Acid reflux, Allergy, Anemia, Anxiety, History of hiatal hernia, Hypertension, Insomnia, RLS (restless legs syndrome), Sensorineural hearing loss, and Von Willebrand disease ([place]). here with     ICD-10-CM   1. Restless leg syndrome  G25.81   2. PLMD (periodic limb movement disorder)  G47.61     Agapito continues to have concerns of restless legs.  After multiple different trials of dosing regimens, he feels that symptoms are best managed with ropinirole ER 2 mg at bedtime and ropinirole IR 0.25 milligrams 1-2 times throughout the day.  We will continue this dosing for now to see how he responds.  We have discussed the possibility of trying Neupro patches.  He is open to this should current regimen being effective.  He was advised that we will not exceed ropinirole 4 mg daily.  He was encouraged to stay active.  He does not drink caffeine.  He will work on adequate  hydration a well-balanced diet.  He will follow-up with [place] in 6 months, sooner if needed.  He verbalizes understanding and agreement with this plan.   No orders of the defined types were placed in this encounter.    No orders of the defined types were placed in this encounter.     I spent 15 minutes with the patient. 50% of this time was spent counseling and educating patient on plan of care and medications.    Debbora Presto, FNP-C 07/01/2019, 10:34 AM [facility] [address] [place], [place] [address] [phone]   I reviewed the above note and documentation by the Nurse Practitioner and agree with the history, exam, assessment and plan as outlined above. I was available for consultation. Star Age, MD, PhD [facility])

## 2019-07-01 NOTE — Patient Instructions (Addendum)
We will continue ropinirole ER 2mg  at bedtime with 1-2 tablets of ropinirole 0.25mg  as needed during the day.   We will consider switching to Neupro if not improvement.   Follow up in 6 months, sooner if needed   Ropinirole extended-release tablets What is this medicine? ROPINIROLE (roe PIN i role) is used to treat the symptoms of Parkinson's disease. It helps to improve muscle control and movement difficulties. This medicine may be used for other purposes; ask your health care provider or pharmacist if you have questions. COMMON BRAND NAME(S): Requip XL What should I tell my health care provider before I take this medicine? They need to know if you have any of these conditions:  heart disease  high blood pressure  kidney disease  liver disease  low blood pressure  narcolepsy  sleep apnea  an unusual or allergic reaction to ropinirole, other medicines, foods, dyes, or preservatives  pregnant or trying to get pregnant  breast-feeding How should I use this medicine? Take this medicine by mouth with a glass of water. Follow the directions on the prescription label. You can take it with or without food. If it upsets your stomach, take it with food. Do not cut, crush or chew this medicine. Take your doses at regular intervals. Do not take your medicine more often than directed. Do not stop taking this medicine except on your doctor's advice. Stopping this medicine too quickly may cause serious side effects. Talk to your pediatrician regarding the use of this medicine in children. Special care may be needed. Overdosage: If you think you have taken too much of this medicine contact a poison control center or emergency room at once. NOTE: This medicine is only for you. Do not share this medicine with others. What if I miss a dose? If you miss a dose, take it as soon as you can. If it is almost time for your next dose, take only that dose. Do not take double or extra doses. What may  interact with this medicine?  certain medicines for depression, mood, or psychotic disorders  ciprofloxacin  male hormones, like estrogens and birth control pills  fluvoxamine  metoclopramide  mexiletine  norfloxacin  omeprazole  rifampin This list may not describe all possible interactions. Give your health care provider a list of all the medicines, herbs, non-prescription drugs, or dietary supplements you use. Also tell them if you smoke, drink alcohol, or use illegal drugs. Some items may interact with your medicine. What should I watch for while using this medicine? Visit your health care professional for regular checks on your progress. Tell your health care professional if your symptoms do not start to get better or if they get worse. Do not stop taking except on your health care professional's advice. You may develop a severe reaction. Your health care professional will tell you how much medicine to take. You may get drowsy or dizzy. Do not drive, use machinery, or do anything that needs mental alertness until you know how this drug affects you. Do not stand or sit up quickly, especially if you are an older patient. This reduces the risk of dizzy or fainting spells. Alcohol may interfere with the effect of this medicine. Avoid alcoholic drinks. When taking this medicine, you may fall asleep without notice. You may be doing activities like driving a car, talking, or eating. You may not feel drowsy before it happens. Contact your health care provider right away if this happens to you. There have been reports  of increased sexual urges or other strong urges such as gambling while taking this medicine. If you experience any of these while taking this medicine, you should report this to your health care provider as soon as possible. Your mouth may get dry. Chewing sugarless gum or sucking hard candy and drinking plenty of water may help. Contact your health care professional if the  problem does not go away or is severe. You should check your skin often for changes to moles and new growths while taking this medicine. Call your doctor if you notice any of these changes. What side effects may I notice from receiving this medicine? Side effects that you should report to your doctor or health care professional as soon as possible:  allergic reactions like skin rash, itching or hives, swelling of the face, lips, or tongue  breathing problems  changes in emotions or moods  changes in vision  chest pain  confusion  falling asleep during normal activities like driving  fast, irregular heartbeat  hallucinations  joint or muscle pain  loss of bladder control  loss of memory  new or increased gambling urges, sexual urges, uncontrolled spending, binge or compulsive eating, or other urges  pain, tingling, numbness in the hands or feet  signs and symptoms of low blood pressure like dizziness; feeling faint or lightheaded, falls; unusually weak or tired  swelling of the ankles, feet, hands  uncontrollable movements of the arms, face, head, mouth, neck, or upper body  vomiting Side effects that usually do not require medical attention (report to your doctor or health care professional if they continue or are bothersome):  dizziness  drowsiness  headache  increased sweating  nausea This list may not describe all possible side effects. Call your doctor for medical advice about side effects. You may report side effects to FDA at 1-800-FDA-1088. Where should I keep my medicine? Keep out of the reach of children. Store at room temperature between 15 and 30 degrees C (59 and 86 degrees F). Protect from light and moisture. Keep container tightly closed. Throw away any unused medicine after the expiration date. NOTE: This sheet is a summary. It may not cover all possible information. If you have questions about this medicine, talk to your doctor, pharmacist, or  health care provider.  2020 Elsevier/Gold Standard (2018-10-29 16:57:28)   Ropinirole tablets What is this medicine? ROPINIROLE (roe PIN i role) is used to treat the symptoms of Parkinson's disease. It helps to improve muscle control and movement difficulties. It is also used for the treatment of Restless Legs Syndrome. This medicine may be used for other purposes; ask your health care provider or pharmacist if you have questions. COMMON BRAND NAME(S): Requip What should I tell my health care provider before I take this medicine? They need to know if you have any of these conditions:  heart disease  high blood pressure  kidney disease  liver disease  low blood pressure  narcolepsy  sleep apnea  an unusual or allergic reaction to ropinirole, other medicines, foods, dyes, or preservatives  pregnant or trying to get pregnant  breast-feeding How should I use this medicine? Take this medicine by mouth with a glass of water. Follow the directions on the prescription label. You can take it with or without food. If it upsets your stomach, take it with food. Take your doses at regular intervals. Do not take your medicine more often than directed. Do not stop taking this medicine except on your doctor's advice. Stopping  this medicine too quickly may cause serious side effects. Talk to your pediatrician regarding the use of this medicine in children. Special care may be needed. Overdosage: If you think you have taken too much of this medicine contact a poison control center or emergency room at once. NOTE: This medicine is only for you. Do not share this medicine with others. What if I miss a dose? If you miss a dose, take it as soon as you can. If it is almost time for your next dose, take only that dose. Do not take double or extra doses. What may interact with this medicine?  certain medicines for depression, mood, or psychotic disorders  ciprofloxacin  male hormones, like  estrogens and birth control pills  fluvoxamine  metoclopramide  mexiletine  norfloxacin  omeprazole  rifampin This list may not describe all possible interactions. Give your health care provider a list of all the medicines, herbs, non-prescription drugs, or dietary supplements you use. Also tell them if you smoke, drink alcohol, or use illegal drugs. Some items may interact with your medicine. What should I watch for while using this medicine? Visit your health care professional for regular checks on your progress. Tell your health care professional if your symptoms do not start to get better or if they get worse. Do not stop taking except on your health care professional's advice. You may develop a severe reaction. Your health care professional will tell you how much medicine to take. You may get drowsy or dizzy. Do not drive, use machinery, or do anything that needs mental alertness until you know how this drug affects you. Do not stand or sit up quickly, especially if you are an older patient. This reduces the risk of dizzy or fainting spells. Alcohol may interfere with the effect of this medicine. Avoid alcoholic drinks. When taking this medicine, you may fall asleep without notice. You may be doing activities like driving a car, talking, or eating. You may not feel drowsy before it happens. Contact your health care provider right away if this happens to you. There have been reports of increased sexual urges or other strong urges such as gambling while taking this medicine. If you experience any of these while taking this medicine, you should report this to your health care provider as soon as possible. Your mouth may get dry. Chewing sugarless gum or sucking hard candy and drinking plenty of water may help. Contact your health care professional if the problem does not go away or is severe. You should check your skin often for changes to moles and new growths while taking this medicine. Call  your doctor if you notice any of these changes. What side effects may I notice from receiving this medicine? Side effects that you should report to your doctor or health care professional as soon as possible:  allergic reactions like skin rash, itching or hives, swelling of the face, lips, or tongue  breathing problems  changes in emotions or moods  changes in vision  chest pain  confusion  falling asleep during normal activities like driving  fast, irregular heartbeat  hallucinations  joint or muscle pain  loss of bladder control  loss of memory  new or increased gambling urges, sexual urges, uncontrolled spending, binge or compulsive eating, or other urges  pain, tingling, numbness in the hands or feet  signs and symptoms of low blood pressure like dizziness; feeling faint or lightheaded, falls; unusually weak or tired  swelling of the ankles, feet,  hands  uncontrollable movements of the arms, face, head, mouth, neck, or upper body  vomiting Side effects that usually do not require medical attention (report to your doctor or health care professional if they continue or are bothersome):  dizziness  drowsiness  headache  increased sweating  nausea This list may not describe all possible side effects. Call your doctor for medical advice about side effects. You may report side effects to FDA at 1-800-FDA-1088. Where should I keep my medicine? Keep out of the reach of children. Store at room temperature between 20 and 25 degrees C (68 and 77 degrees F). Protect from light and moisture. Keep container tightly closed. Throw away any unused medicine after the expiration date. NOTE: This sheet is a summary. It may not cover all possible information. If you have questions about this medicine, talk to your doctor, pharmacist, or health care provider.  2020 Elsevier/Gold Standard (2018-10-29 16:52:05)   Restless Legs Syndrome Restless legs syndrome is a condition  that causes uncomfortable feelings or sensations in the legs, especially while sitting or lying down. The sensations usually cause an overwhelming urge to move the legs. The arms can also sometimes be affected. The condition can range from mild to severe. The symptoms often interfere with a person's ability to sleep. What are the causes? The cause of this condition is not known. What increases the risk? The following factors may make you more likely to develop this condition:  Being older than 50.  Pregnancy.  Being a woman. In general, the condition is more common in women than in men.  A family history of the condition.  Having iron deficiency.  Overuse of caffeine, nicotine, or alcohol.  Certain medical conditions, such as kidney disease, Parkinson's disease, or nerve damage.  Certain medicines, such as those for high blood pressure, nausea, colds, allergies, depression, and some heart conditions. What are the signs or symptoms? The main symptom of this condition is uncomfortable sensations in the legs, such as:  Pulling.  Tingling.  Prickling.  Throbbing.  Crawling.  Burning. Usually, the sensations:  Affect both sides of the body.  Are worse when you sit or lie down.  Are worse at night. These may wake you up or make it difficult to fall asleep.  Make you have a strong urge to move your legs.  Are temporarily relieved by moving your legs. The arms can also be affected, but this is rare. People who have this condition often have tiredness during the day because of their lack of sleep at night. How is this diagnosed? This condition may be diagnosed based on:  Your symptoms.  Blood tests. In some cases, you may be monitored in a sleep lab by a specialist (a sleep study). This can detect any disruptions in your sleep. How is this treated? This condition is treated by managing the symptoms. This may include:  Lifestyle changes, such as exercising, using  relaxation techniques, and avoiding caffeine, alcohol, or tobacco.  Medicines. Anti-seizure medicines may be tried first. Follow these instructions at home:     General instructions  Take over-the-counter and prescription medicines only as told by your health care provider.  Use methods to help relieve the uncomfortable sensations, such as: ? Massaging your legs. ? Walking or stretching. ? Taking a cold or hot bath.  Keep all follow-up visits as told by your health care provider. This is important. Lifestyle  Practice good sleep habits. For example, go to bed and get up at the  same time every day. Most adults should get 7-9 hours of sleep each night.  Exercise regularly. Try to get at least 30 minutes of exercise most days of the week.  Practice ways of relaxing, such as yoga or meditation.  Avoid caffeine and alcohol.  Do not use any products that contain nicotine or tobacco, such as cigarettes and e-cigarettes. If you need help quitting, ask your health care provider. Contact a health care provider if:  Your symptoms get worse or they do not improve with treatment. Summary  Restless legs syndrome is a condition that causes uncomfortable feelings or sensations in the legs, especially while sitting or lying down.  The symptoms often interfere with a person's ability to sleep.  This condition is treated by managing the symptoms. You may need to make lifestyle changes or take medicines. This information is not intended to replace advice given to you by your health care provider. Make sure you discuss any questions you have with your health care provider. Document Revised: 03/17/2017 Document Reviewed: 03/17/2017 Elsevier Patient Education  [employer].

## 2019-07-02 ENCOUNTER — Other Ambulatory Visit: Payer: Self-pay | Admitting: Family Medicine

## 2019-07-02 ENCOUNTER — Other Ambulatory Visit: Payer: Self-pay

## 2019-07-02 ENCOUNTER — Ambulatory Visit (INDEPENDENT_AMBULATORY_CARE_PROVIDER_SITE_OTHER): Payer: Self-pay | Admitting: Physician Assistant

## 2019-07-02 ENCOUNTER — Encounter: Payer: Self-pay | Admitting: Physician Assistant

## 2019-07-02 VITALS — BP 147/63 | HR 73 | Temp 98.1°F | Resp 14 | Ht 69.0 in | Wt 174.6 lb

## 2019-07-02 DIAGNOSIS — K4091 Unilateral inguinal hernia, without obstruction or gangrene, recurrent: Secondary | ICD-10-CM

## 2019-07-02 DIAGNOSIS — Z09 Encounter for follow-up examination after completed treatment for conditions other than malignant neoplasm: Secondary | ICD-10-CM

## 2019-07-02 NOTE — Patient Instructions (Addendum)
May try tight fitting under ware to help with the scrotal swelling. Keep using ice to the area. The swelling, pain, and numbness should continue to get better slowly.                        Follow-up with our office as needed. Please call and ask to speak with a nurse if you develop questions or concerns.   GENERAL POST-OPERATIVE PATIENT INSTRUCTIONS   WOUND CARE INSTRUCTIONS: If clothing rubs against the wound or causes irritation and the wound is not draining you may cover it with a dry dressing during the daytime.  Try to keep the wound dry and avoid ointments on the wound unless directed to do so.  If the wound becomes bright red and painful or starts to drain infected material that is not clear, please contact your physician immediately.  If the wound is mildly pink and has a thick firm ridge underneath it, this is normal, and is referred to as a healing ridge.  This will resolve over the next 4-6 weeks.  BATHING: You may shower, however, Please do not submerge in a tub, hot tub, or pool until incisions are completely sealed or have been told by your surgeon that you may do so.  DIET:  You may eat any foods that you can tolerate.  It is a good idea to eat a high fiber diet and take in plenty of fluids to prevent constipation.  If you do become constipated you may want to take a mild laxative or take ducolax tablets on a daily basis until your bowel habits are regular.  Constipation can be very uncomfortable, along with straining, after recent surgery.  ACTIVITY:  You are encouraged to cough and deep breath or use your incentive spirometer if you were given one, every 15-30 minutes when awake.  This will help prevent respiratory complications and low grade fevers post-operatively if you had a general anesthetic.  You may want to hug a pillow when coughing and sneezing to add additional support to the surgical area, if you had abdominal or chest surgery, which will decrease pain during these  times.  You are encouraged to walk and engage in light activity for the next two weeks.  You should not lift more than 20 pounds for 4-6 weeks after surgery as it could put you at increased risk for complications.  Twenty pounds is roughly equivalent to a plastic bag of groceries. At that time- Listen to your body when lifting, if you have pain when lifting, stop and then try again in a few days. Soreness after doing exercises or activities of daily living is normal as you get back in to your normal routine.  MEDICATIONS:  Try to take narcotic medications and anti-inflammatory medications, such as tylenol, ibuprofen, naprosyn, etc., with food.  This will minimize stomach upset from the medication.  Should you develop nausea and vomiting from the pain medication, or develop a rash, please discontinue the medication and contact your physician.  You should not drive, make important decisions, or operate machinery when taking narcotic pain medication.  SUNBLOCK Use sun block to incision area over the next year if this area will be exposed to sun. This helps decrease scarring and will allow you avoid a permanent darkened area over your incision.  QUESTIONS:  Please feel free to call our office if you have any questions, and we will be glad to assist you.

## 2019-07-02 NOTE — Progress Notes (Signed)
[facility] POST-OP OFFICE VISIT  07/02/2019  HPI: Grant [name] is a 64 y.o. male 15 days s/p open right inguinal hernia repair for recurrent right inguinal hernia with Dr Hampton Abbot   Overall doing well He notes right sided scrotal swelling and what he described as numbness near the incision. This happened with his last procedure and resolved with time Did not require narcotics, pain managed with ibuprofen No fever, chills, nausea, or emesis No issue with bowel function Doing well  Vital signs: BP (!) 147/63   Pulse 73   Temp 98.1 F (36.7 C)   Resp 14   Ht 5\' 9"  (1.753 m)   Wt 174 lb 9.6 oz (79.2 kg)   SpO2 97%   BMI 25.78 kg/m    Physical Exam: Constitutional: Well appearing male, NAD Abdomen: Soft, non-tender, non-distended GU: Right scrotal swelling, testicle palpable, palpable cord Skin: Right inguinal incisions CDI, well healing, no erythema or drainage   Assessment/Plan: This is a 64 y.o. male 15 days s/p open right inguinal hernia repair for recurrent right inguinal hernia   - Pain control prn with ibuprofen and tylenol  - Recommend scrotal support  - reviewed lifting restrictions  - rtc prn; reviewed return precautions   -- Edison Simon, PA-C [facility] 07/02/2019, 10:47 AM [phone] M-F: 7am - 4pm

## 2019-07-11 ENCOUNTER — Encounter: Payer: Self-pay | Admitting: Surgery

## 2019-07-12 ENCOUNTER — Ambulatory Visit (INDEPENDENT_AMBULATORY_CARE_PROVIDER_SITE_OTHER): Payer: Self-pay | Admitting: Surgery

## 2019-07-12 ENCOUNTER — Encounter: Payer: Self-pay | Admitting: Surgery

## 2019-07-12 ENCOUNTER — Other Ambulatory Visit: Payer: Self-pay

## 2019-07-12 VITALS — BP 118/67 | HR 52 | Temp 97.9°F | Resp 14 | Ht 69.0 in | Wt 173.6 lb

## 2019-07-12 DIAGNOSIS — T8189XA Other complications of procedures, not elsewhere classified, initial encounter: Secondary | ICD-10-CM

## 2019-07-12 DIAGNOSIS — T85692A Other mechanical complication of permanent sutures, initial encounter: Secondary | ICD-10-CM

## 2019-07-12 NOTE — Patient Instructions (Addendum)
We have remove an internal suture that broke through your skin. This area should continue to heal over.  The soreness will continue to improve with time.   Follow-up with our office as needed.  Please call and ask to speak with a nurse if you develop questions or concerns.

## 2019-07-12 NOTE — Progress Notes (Signed)
07/12/2019  HPI: Grant [name] is a 64 y.o. male s/p open right recurrent inguinal hernia repair with mesh on 06/17/19.  He called this morning because he has noticed a suture knot protruding from the lateral portion of the incision and it's been aggravating him.  Otherwise, he reports doing well, with some soreness still which is improving, as well as some numbness along the upper inner thigh and some soreness of the scrotum towards the right testicle.  Vital signs: BP 118/67   Pulse (!) 52   Temp 97.9 F (36.6 C)   Resp 14   Ht 5\' 9"  (1.753 m)   Wt 173 lb 9.6 oz (78.7 kg)   SpO2 97%   BMI 25.64 kg/m    Physical Exam: Constitutional:  No acute distress Abdomen:  Right groin incision is clean, dry, intact, with the exception of a small superficial vicryl suture knot protruding at the lateral corner of the wound, causing mild irritation of the surrounding skin.  No abscess.  Suture knot was cut.  There is mild swelling of the incision area which is getting softer and improving.  The right scrotum is normal in size, without any significant swelling and without fluid.  There is an area of firmness at the superior portion of the right testicle.  Assessment/Plan: This is a 64 y.o. male s/p open recurrent right inguinal hernia repair with mesh on 06/17/19.  --Discussed with the patient that the suture knot being cut today should not cause any issues and now that it is removed, the skin will heal and close in a few days.  No other sutures protruding.  No evidence of infection and no antibiotics are needed. --The numbness over the upper inner thigh could be related to injury/strain of the femoral branch of the genitofemoral nerve.  This may improve with time, but am unable to tell him how much it will truly improve.  The firmness over the right testicle will improve as the scar tissue softens. --Follow up prn.   Melvyn Neth, [facility]

## 2019-07-27 ENCOUNTER — Encounter: Payer: Self-pay | Admitting: Family Medicine

## 2019-07-27 MED ORDER — ROPINIROLE HCL 0.25 MG PO TABS: 0.2500 mg | ORAL_TABLET | Freq: Two times a day (BID) | ORAL | 11 refills | Status: DC

## 2019-08-23 ENCOUNTER — Other Ambulatory Visit: Payer: Self-pay | Admitting: Family Medicine

## 2019-09-21 ENCOUNTER — Encounter: Payer: Self-pay | Admitting: Family Medicine

## 2019-09-21 ENCOUNTER — Encounter: Payer: Self-pay | Admitting: Surgery

## 2019-09-21 ENCOUNTER — Telehealth: Payer: Self-pay | Admitting: Neurology

## 2019-09-21 MED ORDER — NEUPRO 1 MG/24HR TD PT24: 1.0000 mg | MEDICATED_PATCH | Freq: Every day | TRANSDERMAL | 5 refills | Status: DC

## 2019-09-21 NOTE — Telephone Encounter (Signed)
Please call patient back regarding his MyChart message.  He is complaining of daytime sleepiness.  Please advise him to stop the Requip XL 2 mg strength and we will start him on Neupro patch 1 mg strength, 1 patch daily, I would recommend that he put the patch on daily at 4 PM or thereabouts.  He can for now continue to utilize the requip immediate release (ropinirole) 0.25 mg strength up to 2 pills daily.  Please advise patient that Neupro can cause side effects similar to all of those so-called dopamine agonists and side effects may include : Sedation, sleepiness, nausea, vomiting, and rare side effects are confusion, hallucinations, swelling in legs, and abnormal behaviors, including impulse control problems, which can manifest as excessive eating, obsessions with food or gambling, or hypersexuality.  Please advise patient that he should not drive when feeling sleepy.  I have sent a prescription to his retail pharmacy for the Neupro patch 1 mg strength.  Please encourage him to let us know if he has any problems by phone call or by email.

## 2019-09-22 ENCOUNTER — Telehealth: Payer: Self-pay | Admitting: Neurology

## 2019-09-22 NOTE — Telephone Encounter (Signed)
Received a PA  Request from [email or web address] for Neupro. Attempted the PA online but received a message that the patient could not be found. Downloaded [employer] DOD Form and completed it. Will have Dr. Rexene Alberts to sign and fax to [employer] for approval.

## 2019-09-24 ENCOUNTER — Ambulatory Visit (INDEPENDENT_AMBULATORY_CARE_PROVIDER_SITE_OTHER): Payer: Medicare Other | Admitting: Surgery

## 2019-09-24 ENCOUNTER — Encounter: Payer: Self-pay | Admitting: Surgery

## 2019-09-24 ENCOUNTER — Other Ambulatory Visit: Payer: Self-pay

## 2019-09-24 VITALS — BP 133/82 | HR 73 | Temp 97.9°F | Ht 69.0 in | Wt 176.0 lb

## 2019-09-24 DIAGNOSIS — K4091 Unilateral inguinal hernia, without obstruction or gangrene, recurrent: Secondary | ICD-10-CM

## 2019-09-24 DIAGNOSIS — R1031 Right lower quadrant pain: Secondary | ICD-10-CM

## 2019-09-24 DIAGNOSIS — G8929 Other chronic pain: Secondary | ICD-10-CM | POA: Diagnosis not present

## 2019-09-24 MED ORDER — GABAPENTIN 100 MG PO CAPS: 100.0000 mg | ORAL_CAPSULE | Freq: Three times a day (TID) | ORAL | 0 refills | Status: DC

## 2019-09-24 NOTE — Progress Notes (Signed)
09/24/2019  History of Present Illness: Grant [name] is a 64 y.o. male s/p open right inguinal hernia repair on 06/17/19 for a recurrent inguinal hernia.  He presents today because since surgery, he's continued to have intermittent shooting pain in the right groin, going towards the right testicle.  Denies any swelling or issues with the incision.  The pain happens intermittently, about 2-3 times per day.  After further asking, it happens during activity while at work or when he's moving around, but does not feel like it happens while sleeping or when just laying down.  Sometimes the pain is severe enough that he has to grab or hold his groin to massage it.  However, the pain is short-lived and only may last a couple minutes.  No issues on the left side.  Denies any bulging sensation.    Past Medical History: Past Medical History:  Diagnosis Date  . Acid reflux   . Allergy   . Anemia   . Anxiety   . History of hiatal hernia   . Hypertension   . Insomnia   . RLS (restless legs syndrome)   . Sensorineural hearing loss   . Von Willebrand disease ([place])    PT STATES THIS IS MILD AND HAS NEVER HAD TO SEE HEMATOLOGIST-PT DID SAY THAT WHEN [place] AGO HE BLED ALOT BUT IT WAS DUE TO NOT FOLLOWING POST OP INSTRUCTIONS      Past Surgical History: Past Surgical History:  Procedure Laterality Date  . CIRCUMCISION     AS AN ADULT  . COLONOSCOPY    . FOOT SURGERY Right    arch support  . HERNIA REPAIR    . INCISION AND DRAINAGE Right 02/04/2013   Procedure: INCISION AND DRAINAGE;  Surgeon: Linna Hoff, MD;  Location: [facility];  Service: Orthopedics;  Laterality: Right;  . INGUINAL HERNIA REPAIR Bilateral 05/13/2018   Procedure: LAPAROSCOPIC BILATERAL INGUINAL HERNIA REPAIR;  Surgeon: Olean Ree, MD;  Location: [facility];  Service: General;  Laterality: Bilateral;  . INGUINAL HERNIA REPAIR Right 06/17/2019   Procedure: HERNIA REPAIR INGUINAL ADULT WITH MESH-- Recurrent;   Surgeon: Olean Ree, MD;  Location: [facility];  Service: General;  Laterality: Right;  . OPEN REDUCTION INTERNAL FIXATION (ORIF) FINGER WITH RADIAL BONE GRAFT Right 02/04/2013   Procedure: OPEN REDUCTION INTERNAL FIXATION (ORIF) right ring and small fingers;  Surgeon: Linna Hoff, MD;  Location: [facility];  Service: Orthopedics;  Laterality: Right;    Home Medications: Prior to Admission medications   Medication Sig Start Date End Date Taking? Authorizing Provider  amLODipine (NORVASC) 10 MG tablet Take 1 tablet (10 mg total) by mouth daily. 04/20/19  Yes Susy Frizzle, MD  EPINEPHRINE 0.3 mg/0.3 mL IJ SOAJ injection INJECT 0.3 ML (0.3 MG) INTO THE MUSCLE ONCE FOR 1 DOSE 03/15/19  Yes Susy Frizzle, MD  fluticasone (FLONASE) 50 MCG/ACT nasal spray Place 2 sprays into both nostrils daily. 04/29/19  Yes [provider]  losartan (COZAAR) 50 MG tablet TAKE 1 TABLET DAILY 08/23/19  Yes Susy Frizzle, MD  pantoprazole (PROTONIX) 40 MG tablet TAKE 1 TABLET DAILY 05/28/19  Yes Susy Frizzle, MD  Rotigotine (NEUPRO) 1 MG/24HR PT24 Place 1 patch (1 mg total) onto the skin daily at 4 PM. 09/21/19  Yes Star Age, MD  gabapentin (NEURONTIN) 100 MG capsule Take 1 capsule (100 mg total) by mouth 3 (three) times daily. 09/24/19 10/24/19  Olean Ree, MD  rOPINIRole (REQUIP) 0.25 MG tablet Take 1 tablet (0.25 mg total) by mouth in the morning and at bedtime. Patient not taking: Reported on 09/24/2019 07/27/19   Debbora Presto, NP    Allergies: Allergies  Allergen Reactions  . Bee Venom Anaphylaxis, Swelling and Rash  . Asa [Aspirin] Other (See Comments)    Gi upset    Review of Systems: Review of Systems  Constitutional: Negative for chills and fever.  Respiratory: Negative for shortness of breath.   Cardiovascular: Negative for chest pain.  Gastrointestinal: Positive for abdominal pain (right groin pain). Negative for diarrhea, nausea and vomiting.  Skin: Negative for rash.     Physical Exam BP 133/82   Pulse 73   Temp 97.9 F (36.6 C) (Oral)   Ht 5\' 9"  (1.753 m)   Wt 176 lb (79.8 kg)   SpO2 98%   BMI 25.99 kg/m  CONSTITUTIONAL: No acute distress HEENT:  Normocephalic, atraumatic, extraocular motion intact. RESPIRATORY:  Lungs are clear, and breath sounds are equal bilaterally. Normal respiratory effort without pathologic use of accessory muscles. CARDIOVASCULAR: Heart is regular without murmurs, gallops, or rubs. GI: The abdomen is soft, non-distended, with some discomfort to deep palpation of the right groin along the incision.  No evidence of hernia recurrence.  No palpable masses.  The incision has otherwise healed very well.  NEUROLOGIC:  Motor and sensation is grossly normal.  Cranial nerves are grossly intact. PSYCH:  Alert and oriented to person, place and time. Affect is normal.  Labs/Imaging: None recently  Assessment and Plan: This is a 64 y.o. male s/p recurrent right inguinal hernia repair, with what appears to be chronic groin pain.  --Discussed with the patient that this pain could be from the mesh if it's scarring onto the groin nerves, vs scar tissue that's irritating the nerves.  Discussed that although this could improve some with time, this could also become a chronic issue. --Discussed different potential options that we can do to treat this.  Discussed the possibility of trying a nerve block today under ultrasound guidance, but it would be short-term duration as we do not have any long term analgesics in our office.  Also discussed the possibility of medical therapy, and that we can try using Neurontin to help with neuropathic type of pain.  He would like to try medical therapy first.   --Will give him Neurontin 100 mg TID to start with and he will call us next week or so to see how the low dose is working and if we need to adjust to a higher dose.  Discussed the more common side effect of drowsiness.  Face-to-face time spent with the  patient and care providers was 15 minutes, with more than 50% of the time spent counseling, educating, and coordinating care of the patient.     Melvyn Neth, [facility]

## 2019-09-24 NOTE — Patient Instructions (Addendum)
Pick up your medication at your local pharmacy. After the months supply please call the office or send a MyChart message to Dr Hampton Abbot to see how this medication is working. If you feel like this is not working, contact the office before hand.   Call the office if you have any questions or concerns.

## 2019-09-27 NOTE — Telephone Encounter (Signed)
Received signed form back this morning. Will fax to [facility] today.

## 2019-09-28 NOTE — Telephone Encounter (Signed)
Received fax from [employer]. PA was approved until 03/10/2098 and as long as the member maintains active coverage. Faxed a copy of approval to the pharmacy. Nothing further needed at time of call.

## 2019-10-14 ENCOUNTER — Encounter: Payer: Self-pay | Admitting: Family Medicine

## 2019-10-21 ENCOUNTER — Ambulatory Visit: Payer: Medicare Other | Admitting: Family Medicine

## 2019-10-21 MED ORDER — NEUPRO 1 MG/24HR TD PT24: 1.0000 mg | MEDICATED_PATCH | Freq: Every day | TRANSDERMAL | 1 refills | Status: DC

## 2019-10-21 MED ORDER — NEUPRO 1 MG/24HR TD PT24: 1.0000 mg | MEDICATED_PATCH | Freq: Every day | TRANSDERMAL | 5 refills | Status: DC

## 2019-10-21 NOTE — Addendum Note (Signed)
Addended by: Debbora Presto L on: 10/21/2019 05:14 PM   Modules accepted: Orders

## 2019-10-25 ENCOUNTER — Other Ambulatory Visit: Payer: Self-pay

## 2019-10-25 ENCOUNTER — Ambulatory Visit (INDEPENDENT_AMBULATORY_CARE_PROVIDER_SITE_OTHER): Payer: Medicare Other | Admitting: Family Medicine

## 2019-10-25 VITALS — BP 130/76 | HR 69 | Temp 98.9°F | Ht 66.0 in | Wt 171.0 lb

## 2019-10-25 DIAGNOSIS — R413 Other amnesia: Secondary | ICD-10-CM

## 2019-10-25 DIAGNOSIS — I10 Essential (primary) hypertension: Secondary | ICD-10-CM | POA: Diagnosis not present

## 2019-10-25 DIAGNOSIS — Z125 Encounter for screening for malignant neoplasm of prostate: Secondary | ICD-10-CM

## 2019-10-25 MED ORDER — LIDOCAINE 5 % EX OINT: 1.0000 "application " | TOPICAL_OINTMENT | Freq: Four times a day (QID) | CUTANEOUS | 1 refills | Status: DC | PRN

## 2019-10-25 NOTE — Progress Notes (Signed)
Subjective:    Patient ID: Grant [name], male    DOB: [birth year], 64 y.o.   MRN: [id number]  Patient is a very pleasant 64 year old Caucasian male here today for a checkup.  He had a right inguinal hernia repair recently.  He continues to have neuropathic type pain in the right inguinal canal.  He states that he will occasionally get sharp stabbing pain.  This can occur with or without movement.  It does not radiate.  Surgeon tried him on gabapentin but he cannot take the medication due to side effects.  There is no erythema.  There is no palpable abnormality.  It is gradually getting better.  He also complains of some numbness in the overlying skin.  He is due for prostate cancer screening.  He is also due for Covid vaccine.  He is hesitant to receive the Covid vaccine.  I strongly strongly strongly encouraged the patient to get the Covid shot.  He is also due for fasting lab work to monitor his kidneys and his cholesterol.  Patient states that he is noticing short-term memory loss over the last several years.  He states that people occasionally asked him to do things at work.  If is more than 1 task, he will often forget one of the things that they ask him to do.  He denies getting lost driving.  He denies forgetting names.  He denies losing money or trouble paying his finances.  Situation has been stable for several years and I suspect may be some age-related decline in short-term memory. Past Medical History:  Diagnosis Date  . Acid reflux   . Allergy   . Anemia   . Anxiety   . History of hiatal hernia   . Hypertension   . Insomnia   . RLS (restless legs syndrome)   . Sensorineural hearing loss   . Von Willebrand disease ([place])    PT STATES THIS IS MILD AND HAS NEVER HAD TO SEE HEMATOLOGIST-PT DID SAY THAT WHEN [place]GO HE BLED ALOT BUT IT WAS DUE TO NOT FOLLOWING POST OP INSTRUCTIONS    Past Surgical History:  Procedure Laterality Date  . CIRCUMCISION      AS AN ADULT  . COLONOSCOPY    . FOOT SURGERY Right    arch support  . HERNIA REPAIR    . INCISION AND DRAINAGE Right 02/04/2013   Procedure: INCISION AND DRAINAGE;  Surgeon: Linna Hoff, MD;  Location: [facility];  Service: Orthopedics;  Laterality: Right;  . INGUINAL HERNIA REPAIR Bilateral 05/13/2018   Procedure: LAPAROSCOPIC BILATERAL INGUINAL HERNIA REPAIR;  Surgeon: Olean Ree, MD;  Location: [facility];  Service: General;  Laterality: Bilateral;  . INGUINAL HERNIA REPAIR Right 06/17/2019   Procedure: HERNIA REPAIR INGUINAL ADULT WITH MESH-- Recurrent;  Surgeon: Olean Ree, MD;  Location: [facility];  Service: General;  Laterality: Right;  . OPEN REDUCTION INTERNAL FIXATION (ORIF) FINGER WITH RADIAL BONE GRAFT Right 02/04/2013   Procedure: OPEN REDUCTION INTERNAL FIXATION (ORIF) right ring and small fingers;  Surgeon: Linna Hoff, MD;  Location: [facility];  Service: Orthopedics;  Laterality: Right;   Current Outpatient Medications on File Prior to Visit  Medication Sig Dispense Refill  . amLODipine (NORVASC) 10 MG tablet Take 1 tablet (10 mg total) by mouth daily. 90 tablet 3  . EPINEPHRINE 0.3 mg/0.3 mL IJ SOAJ injection INJECT 0.3 ML (0.3 MG) INTO THE MUSCLE ONCE FOR 1 DOSE 2 each  11  . fluticasone (FLONASE) 50 MCG/ACT nasal spray Place 2 sprays into both nostrils daily.    Marland Kitchen losartan (COZAAR) 50 MG tablet TAKE 1 TABLET DAILY 90 tablet 1  . pantoprazole (PROTONIX) 40 MG tablet TAKE 1 TABLET DAILY 90 tablet 3  . rOPINIRole (REQUIP) 0.25 MG tablet Take 1 tablet (0.25 mg total) by mouth in the morning and at bedtime. 60 tablet 11  . Rotigotine (NEUPRO) 1 MG/24HR PT24 Place 1 patch (1 mg total) onto the skin daily at 4 PM. 90 patch 1  . gabapentin (NEURONTIN) 100 MG capsule Take 1 capsule (100 mg total) by mouth 3 (three) times daily. 90 capsule 0   No current facility-administered medications on file prior to visit.   Allergies  Allergen Reactions  . Bee Venom Anaphylaxis, Swelling and  Rash  . Asa [Aspirin] Other (See Comments)    Gi upset   Social History   Socioeconomic History  . Marital status: Married    Spouse name: Not on file  . Number of children: 4  . Years of education: 87  . Highest education level: Not on file  Occupational History  . Occupation: Sales executive   Tobacco Use  . Smoking status: Former Smoker    Packs/day: 0.50    Types: Cigarettes    Quit date: 05/10/2016    Years since quitting: 3.4  . Smokeless tobacco: Never Used  Vaping Use  . Vaping Use: Former  . Quit date: 05/10/2016  Substance and Sexual Activity  . Alcohol use: No  . Drug use: Yes    Types: Marijuana    Comment: uses daily  . Sexual activity: Not on file  Other Topics Concern  . Not on file  Social History Narrative   Denies caffeine use    Social Determinants of Health   Financial Resource Strain:   . Difficulty of Paying Living Expenses:   Food Insecurity:   . Worried About Charity fundraiser in the Last Year:   . Arboriculturist in the Last Year:   Transportation Needs:   . Film/video editor (Medical):   Marland Kitchen Lack of Transportation (Non-Medical):   Physical Activity:   . Days of Exercise per Week:   . Minutes of Exercise per Session:   Stress:   . Feeling of Stress :   Social Connections:   . Frequency of Communication with Friends and Family:   . Frequency of Social Gatherings with Friends and Family:   . Attends Religious Services:   . Active Member of Clubs or Organizations:   . Attends Archivist Meetings:   Marland Kitchen Marital Status:   Intimate Partner Violence:   . Fear of Current or Ex-Partner:   . Emotionally Abused:   Marland Kitchen Physically Abused:   . Sexually Abused:    Family History  Problem Relation Age of Onset  . Hyperlipidemia Mother   . Hyperlipidemia Father   . Colon cancer Neg Hx   . Esophageal cancer Neg Hx   . Rectal cancer Neg Hx   . Stomach cancer Neg Hx        Review of Systems  All other systems reviewed and are  negative.      Objective:   Physical Exam Vitals reviewed.  Constitutional:      General: He is not in acute distress.    Appearance: He is well-developed. He is not diaphoretic.  Cardiovascular:     Rate and Rhythm: Normal rate and regular rhythm.  Heart sounds: Normal heart sounds. No murmur heard.  No friction rub. No gallop.   Pulmonary:     Effort: Pulmonary effort is normal. No respiratory distress.     Breath sounds: Normal breath sounds. No wheezing or rales.  Chest:     Chest wall: No tenderness.  Abdominal:     General: Bowel sounds are normal. There is no distension.     Palpations: Abdomen is soft. There is no mass.     Tenderness: There is no abdominal tenderness. There is no guarding or rebound.  Skin:    General: Skin is warm.  Neurological:     Mental Status: He is alert and oriented to person, place, and time.     Cranial Nerves: No cranial nerve deficit.     Motor: No abnormal muscle tone.     Coordination: Coordination normal.     Deep Tendon Reflexes: Reflexes are normal and symmetric.           Assessment & Plan:  Benign essential HTN - Plan: CBC with Differential/Platelet, COMPLETE METABOLIC PANEL WITH GFR, Lipid panel  Prostate cancer screening - Plan: PSA  Memory loss - Plan: Vitamin B12, TSH  Blood pressure today is well controlled.  Check CBC, CMP, fasting lipid panel.  We will try the patient on 5% topical lidocaine ointment applied 2-3 times a day as needed for nerve like pain in the right inguinal canal.  I believe the memory loss is likely benign decline in his short-term memory due to age.  I do not see any signs of a progressive neurodegenerative condition.  However I will check vitamin B12 and TSH to evaluate for reversible causes of memory loss.  Screen for prostate cancer with a PSA.  Strongly encouraged the Covid vaccine.Marland Kitchen

## 2019-10-26 ENCOUNTER — Encounter: Payer: Self-pay | Admitting: Family Medicine

## 2019-10-27 ENCOUNTER — Other Ambulatory Visit: Payer: Self-pay

## 2019-10-27 DIAGNOSIS — R739 Hyperglycemia, unspecified: Secondary | ICD-10-CM

## 2019-10-28 ENCOUNTER — Other Ambulatory Visit: Payer: Self-pay

## 2019-10-28 ENCOUNTER — Other Ambulatory Visit: Payer: Medicare Other

## 2019-10-28 DIAGNOSIS — R739 Hyperglycemia, unspecified: Secondary | ICD-10-CM

## 2019-10-29 ENCOUNTER — Encounter: Payer: Self-pay | Admitting: *Deleted

## 2019-10-29 LAB — COMPLETE METABOLIC PANEL WITH GFR
AG Ratio: 2 (calc) (ref 1.0–2.5)
ALT: 14 U/L (ref 9–46)
AST: 17 U/L (ref 10–35)
Albumin: 4.5 g/dL (ref 3.6–5.1)
Alkaline phosphatase (APISO): 91 U/L (ref 35–144)
BUN: 14 mg/dL (ref 7–25)
CO2: 29 mmol/L (ref 20–32)
Calcium: 9.6 mg/dL (ref 8.6–10.3)
Chloride: 105 mmol/L (ref 98–110)
Creat: 0.96 mg/dL (ref 0.70–1.25)
GFR, Est African American: 96 mL/min/{1.73_m2} (ref >=60)
GFR, Est Non African American: 83 mL/min/{1.73_m2} (ref >=60)
Globulin: 2.2 g/dL (calc) (ref 1.9–3.7)
Glucose, Bld: 154 mg/dL — ABNORMAL HIGH (ref 65–99)
Potassium: 4.3 mmol/L (ref 3.5–5.3)
Sodium: 141 mmol/L (ref 135–146)
Total Bilirubin: 0.8 mg/dL (ref 0.2–1.2)
Total Protein: 6.7 g/dL (ref 6.1–8.1)

## 2019-10-29 LAB — CBC WITH DIFFERENTIAL/PLATELET
Absolute Monocytes: 451 cells/uL (ref 200–950)
Basophils Absolute: 31 cells/uL (ref 0–200)
Basophils Relative: 0.5 %
Eosinophils Absolute: 110 cells/uL (ref 15–500)
Eosinophils Relative: 1.8 %
HCT: 43.4 % (ref 38.5–50.0)
Hemoglobin: 14.5 g/dL (ref 13.2–17.1)
Lymphs Abs: 1440 cells/uL (ref 850–3900)
MCH: 30.3 pg (ref 27.0–33.0)
MCHC: 33.4 g/dL (ref 32.0–36.0)
MCV: 90.8 fL (ref 80.0–100.0)
MPV: 9.5 fL (ref 7.5–12.5)
Monocytes Relative: 7.4 %
Neutro Abs: 4069 cells/uL (ref 1500–7800)
Neutrophils Relative %: 66.7 %
Platelets: 274 10*3/uL (ref 140–400)
RBC: 4.78 10*6/uL (ref 4.20–5.80)
RDW: 11.8 % (ref 11.0–15.0)
Total Lymphocyte: 23.6 %
WBC: 6.1 10*3/uL (ref 3.8–10.8)

## 2019-10-29 LAB — TEST AUTHORIZATION

## 2019-10-29 LAB — LIPID PANEL
Cholesterol: 168 mg/dL (ref <200)
HDL: 37 mg/dL — ABNORMAL LOW (ref >=40)
LDL Cholesterol (Calc): 112 mg/dL (calc) — ABNORMAL HIGH
Non-HDL Cholesterol (Calc): 131 mg/dL (calc) — ABNORMAL HIGH (ref <130)
Total CHOL/HDL Ratio: 4.5 (calc) (ref <5.0)
Triglycerides: 88 mg/dL (ref <150)

## 2019-10-29 LAB — HEMOGLOBIN A1C W/OUT EAG: Hgb A1c MFr Bld: 5.4 % of total Hgb (ref <5.7)

## 2019-10-29 LAB — TSH: TSH: 1.53 mIU/L (ref 0.40–4.50)

## 2019-10-29 LAB — PSA: PSA: 1.1 ng/mL (ref <=4.0)

## 2019-10-29 LAB — VITAMIN B12: Vitamin B-12: 383 pg/mL (ref 200–1100)

## 2020-01-03 ENCOUNTER — Encounter: Payer: Self-pay | Admitting: Family Medicine

## 2020-01-03 ENCOUNTER — Ambulatory Visit (INDEPENDENT_AMBULATORY_CARE_PROVIDER_SITE_OTHER): Payer: Medicare Other | Admitting: Family Medicine

## 2020-01-03 ENCOUNTER — Other Ambulatory Visit: Payer: Self-pay

## 2020-01-03 VITALS — BP 123/61 | HR 57 | Ht 66.0 in | Wt 169.0 lb

## 2020-01-03 DIAGNOSIS — G4761 Periodic limb movement disorder: Secondary | ICD-10-CM

## 2020-01-03 DIAGNOSIS — G2581 Restless legs syndrome: Secondary | ICD-10-CM | POA: Diagnosis not present

## 2020-01-03 MED ORDER — ROPINIROLE HCL 0.25 MG PO TABS: 0.2500 mg | ORAL_TABLET | Freq: Two times a day (BID) | ORAL | 3 refills | Status: DC

## 2020-01-03 MED ORDER — NEUPRO 1 MG/24HR TD PT24: 1.0000 mg | MEDICATED_PATCH | Freq: Every day | TRANSDERMAL | 3 refills | Status: DC

## 2020-01-03 NOTE — Patient Instructions (Addendum)
We will continue Neupro daily and ropinirole 0.25mg  1-2 times daily as needed.   Stay well hydrated. Regular exercise and well balanced diet recommended.   Follow up in 1 year   Restless Legs Syndrome Restless legs syndrome is a condition that causes uncomfortable feelings or sensations in the legs, especially while sitting or lying down. The sensations usually cause an overwhelming urge to move the legs. The arms can also sometimes be affected. The condition can range from mild to severe. The symptoms often interfere with a person's ability to sleep. What are the causes? The cause of this condition is not known. What increases the risk? The following factors may make you more likely to develop this condition:  Being older than 50.  Pregnancy.  Being a woman. In general, the condition is more common in women than in men.  A family history of the condition.  Having iron deficiency.  Overuse of caffeine, nicotine, or alcohol.  Certain medical conditions, such as kidney disease, Parkinson's disease, or nerve damage.  Certain medicines, such as those for high blood pressure, nausea, colds, allergies, depression, and some heart conditions. What are the signs or symptoms? The main symptom of this condition is uncomfortable sensations in the legs, such as:  Pulling.  Tingling.  Prickling.  Throbbing.  Crawling.  Burning. Usually, the sensations:  Affect both sides of the body.  Are worse when you sit or lie down.  Are worse at night. These may wake you up or make it difficult to fall asleep.  Make you have a strong urge to move your legs.  Are temporarily relieved by moving your legs. The arms can also be affected, but this is rare. People who have this condition often have tiredness during the day because of their lack of sleep at night. How is this diagnosed? This condition may be diagnosed based on:  Your symptoms.  Blood tests. In some cases, you may be  monitored in a sleep lab by a specialist (a sleep study). This can detect any disruptions in your sleep. How is this treated? This condition is treated by managing the symptoms. This may include:  Lifestyle changes, such as exercising, using relaxation techniques, and avoiding caffeine, alcohol, or tobacco.  Medicines. Anti-seizure medicines may be tried first. Follow these instructions at home:     General instructions  Take over-the-counter and prescription medicines only as told by your health care provider.  Use methods to help relieve the uncomfortable sensations, such as: ? Massaging your legs. ? Walking or stretching. ? Taking a cold or hot bath.  Keep all follow-up visits as told by your health care provider. This is important. Lifestyle  Practice good sleep habits. For example, go to bed and get up at the same time every day. Most adults should get 7-9 hours of sleep each night.  Exercise regularly. Try to get at least 30 minutes of exercise most days of the week.  Practice ways of relaxing, such as yoga or meditation.  Avoid caffeine and alcohol.  Do not use any products that contain nicotine or tobacco, such as cigarettes and e-cigarettes. If you need help quitting, ask your health care provider. Contact a health care provider if:  Your symptoms get worse or they do not improve with treatment. Summary  Restless legs syndrome is a condition that causes uncomfortable feelings or sensations in the legs, especially while sitting or lying down.  The symptoms often interfere with a person's ability to sleep.  This condition  is treated by managing the symptoms. You may need to make lifestyle changes or take medicines. This information is not intended to replace advice given to you by your health care provider. Make sure you discuss any questions you have with your health care provider. Document Revised: 03/17/2017 Document Reviewed: 03/17/2017 Elsevier Patient  Education  [place].

## 2020-01-03 NOTE — Progress Notes (Addendum)
Chief Complaint  Patient presents with   Follow-up    rm 1   Restless Leg Syndrome    pt has no new sx or concerns     HISTORY OF PRESENT ILLNESS: Today 01/03/20  Grant [name] is a 64 y.o. male here today for follow up for PLMD and RLS. He was switched to Neupro patch in 10/2019. He continues ropinirole 0.25mg  BID as needed. He has adjusted well to using patch. He feels daytime sleepiness has improved. He was started back on gabapentin with GI, however, he did not tolerate it well. He reports more depression when taking it. He has discontinued it and feeling well.    HISTORY (copied from my note on 07/01/2019)  Grant [name] is a 64 y.o. male here today for follow up for RLS. Sleep study in 07/2016 showed normal AHI but severe PMLD. He was previously taking 13 tablets of 0.25mg  ropinirole throughout the day. We swithced to extended release in 10/2018. He reports taking ropinirole XL 2 mg twice daily for a couple of months. He continues to have leg twitching. He has tried multiple different dosing times.  Most recently, he has taken ropinirole ER 2 mg at bedtime as well as ropinirole IR 0.25 mg 1-2 times throughout the day.  He feels that this regimen has been most beneficial.  Total daily dosing of 2.5 mg.   HISTORY: (copied from my note on 04/01/2019)  Grant [name] a 64 y.o.malehere today for follow up. 3,3 2at 5, 2at 9 and 3 at bedtime . He continues to have trouble with restlessness and jerking of both legs.He is taking 3 tablets every morning, 3 at lunch, 2 around 5pm, 2 at 9pm and 3 at bedtime for a total of 13 tablets daily (3.25mg .) He has had dizziness for the past couple of yearsbut does not feel it is related to ropinirole. No side effects noted after taking ropinirole.Has tried Lyrica and gabapentin in the past but reports that he did not feel well on these. He reports suicidal ideations with both.  HISTORY: (copied fromCarolyn Marin'snote on 11/27/2017)  Mr.  [name] is a 64 year old right-handed gentleman with an underlying medical history of hypertension, reflux disease, allergies, and overweight state, who presents for follow up consultation of his restless legs and PLMS. The patient is unaccompanied today. I last saw him on 11/19/2016, at which time he was taking ropinirole 0.25 mg strength at 3 different times. He had recently suffered serious dog bites that became infected and needed treatment for this as an inpatient. I suggested cautious increase in his Requip 0.25 mg strength 2 pills 3 times a day. He was advised regarding augmentation.   Today, 07/15/2017 (all dictated new, as well as above notes, some dictation done in note pad or Word, outside of chart, may appear as copied): He reportsfeeling about the same, maybe a little better. He takes requip 1 pill at 6 AM (2 pills was too much in AM), 1 to 2 at 4:30 and 2 at BT, which ranges from 10 PM to MN. Sometimes he has symptoms in his arms. Sx of RLS date back to several years ago. He may have tried gabapentin in the past for Back pain, not sure if he tolerated it. He would be willing to retry it.  UPDATE9/19/2019CMMr. Grant [name], 64 year old male returns for follow-up with history of restless leg syndrome.He claims that his restless legs are a little bit worse. He thinks his Toprol is making his restless  legs worse so he has stopped the medication and wants [place] to make a suggestion however in reviewing side effects of Toprol restless legs is not a side effect. His blood pressure is elevated in the office today at 156/61. He was encouraged to follow-up with his primary care regarding his blood pressure. 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 His legs are more bothersome today because he had to climb a ladder. He was started back on gabapentin after his last visit in May with Dr. Ferrel Logan patient states once he got to 300 mg he had suicidal idealizations and he cut it back to 100 mg.  He has not had further suicidal idealzations on that dose.He currently takes Requip 0.25mg 3 tablets twice a day.He has a history of anemia in the past but his most recent CBC with hemoglobin of 13.5.He returns for reevaluation   REVIEW OF SYSTEMS: Out of a complete 14 system review of symptoms, the patient complains only of the following symptoms, RLS and all other reviewed systems are negative.   ALLERGIES: Allergies  Allergen Reactions   Bee Venom Anaphylaxis, Swelling and Rash   Asa [Aspirin] Other (See Comments)    Gi upset     HOME MEDICATIONS: Outpatient Medications Prior to Visit  Medication Sig Dispense Refill   amLODipine (NORVASC) 10 MG tablet Take 1 tablet (10 mg total) by mouth daily. 90 tablet 3   EPINEPHRINE 0.3 mg/0.3 mL IJ SOAJ injection INJECT 0.3 ML (0.3 MG) INTO THE MUSCLE ONCE FOR 1 DOSE 2 each 11   fluticasone (FLONASE) 50 MCG/ACT nasal spray Place 2 sprays into both nostrils daily.     lidocaine (XYLOCAINE) 5 % ointment Apply 1 application topically 4 (four) times daily as needed. 150 g 1   losartan (COZAAR) 50 MG tablet TAKE 1 TABLET DAILY 90 tablet 1   pantoprazole (PROTONIX) 40 MG tablet TAKE 1 TABLET DAILY 90 tablet 3   Rotigotine (NEUPRO) 1 MG/24HR PT24 Place 1 patch (1 mg total) onto the skin daily at 4 PM. 90 patch 1   gabapentin (NEURONTIN) 100 MG capsule Take 1 capsule (100 mg total) by mouth 3 (three) times daily. 90 capsule 0   rOPINIRole (REQUIP) 0.25 MG tablet Take 1 tablet (0.25 mg total) by mouth in the morning and at bedtime. 60 tablet 11   No facility-administered medications prior to visit.     PAST MEDICAL HISTORY: Past Medical History:  Diagnosis Date   Acid reflux    Allergy    Anemia    Anxiety    History of hiatal hernia    Hypertension    Insomnia    RLS (restless legs syndrome)    Sensorineural hearing loss    Von Willebrand disease ([place])    PT STATES THIS IS MILD AND HAS NEVER HAD TO SEE  HEMATOLOGIST-PT DID SAY THAT WHEN HE WAS [place]CUMC[place] YE[place] AGO HE BLED ALOT BUT IT WAS DUE TO NOT FOLLOWING POST OP INSTRUCTIONS      PAST SURGICAL HISTORY: Past Surgical History:  Procedure Laterality Date   CIRCUMCISION     AS AN ADULT   COLONOSCOPY     FOOT SURGERY Right    arch support   HERNIA REPAIR     INCISION AND DRAINAGE Right 02/04/2013   Procedure: INCISION AND DRAINAGE;  Surgeon: Linna Hoff, MD;  Location: [place];  Service: Orthopedics;  Laterality: Right;   INGUINAL HERNIA REPAIR Bilateral 05/13/2018   Procedure: LAPAROSCOPIC BILATERAL INGUINAL HERNIA  REPAIR;  Surgeon: Olean Ree, MD;  Location: [facility];  Service: General;  Laterality: Bilateral;   INGUINAL HERNIA REPAIR Right 06/17/2019   Procedure: HERNIA REPAIR INGUINAL ADULT WITH MESH-- Recurrent;  Surgeon: Olean Ree, MD;  Location: [facility];  Service: General;  Laterality: Right;   OPEN REDUCTION INTERNAL FIXATION (ORIF) FINGER WITH RADIAL BONE GRAFT Right 02/04/2013   Procedure: OPEN REDUCTION INTERNAL FIXATION (ORIF) right ring and small fingers;  Surgeon: Linna Hoff, MD;  Location: [place];  Service: Orthopedics;  Laterality: Right;     FAMILY HISTORY: Family History  Problem Relation Age of Onset   Hyperlipidemia Mother    Hyperlipidemia Father    Colon cancer Neg Hx    Esophageal cancer Neg Hx    Rectal cancer Neg Hx    Stomach cancer Neg Hx      SOCIAL HISTORY: Social History   Socioeconomic History   Marital status: Married    Spouse name: Not on file   Number of children: 4   Years of education: 12   Highest education level: Not on file  Occupational History   Occupation: Sales executive   Tobacco Use   Smoking status: Former Smoker    Packs/day: 0.50    Types: Cigarettes    Quit date: 05/10/2016    Years since quitting: 3.6   Smokeless tobacco: Never Used  Vaping Use   Vaping Use: Former   Quit date: 05/10/2016  Substance and Sexual Activity     Alcohol use: No   Drug use: Yes    Types: Marijuana    Comment: uses daily   Sexual activity: Not on file  Other Topics Concern   Not on file  Social History Narrative   Denies caffeine use    Social Determinants of Health   Financial Resource Strain:    Difficulty of Paying Living Expenses: Not on file  Food Insecurity:    Worried About Charity fundraiser in the Last Year: Not on file   [employer] of Food in the Last Year: Not on file  Transportation Needs:    Lack of Transportation (Medical): Not on file   Lack of Transportation (Non-Medical): Not on file  Physical Activity:    Days of Exercise per Week: Not on file   Minutes of Exercise per Session: Not on file  Stress:    Feeling of Stress : Not on file  Social Connections:    Frequency of Communication with Friends and Family: Not on file   Frequency of Social Gatherings with Friends and Family: Not on file   Attends Religious Services: Not on file   Active Member of Clubs or Organizations: Not on file   Attends Archivist Meetings: Not on file   Marital Status: Not on file  Intimate Partner Violence:    Fear of Current or Ex-Partner: Not on file   Emotionally Abused: Not on file   Physically Abused: Not on file   Sexually Abused: Not on file      PHYSICAL EXAM  Vitals:   01/03/20 0856  BP: 123/61  Pulse: (!) 57  Weight: 169 lb (76.7 kg)  Height: 5\' 6"  (1.676 m)   Body mass index is 27.28 kg/m.   Generalized: Well developed, in no acute distress   Neurological examination  Mentation: Alert oriented to time, place, history taking. Follows all commands speech and language fluent Cranial nerve II-XII: Pupils were equal round reactive to light. Extraocular movements were full, visual field  were full on confrontational test. Facial sensation and strength were normal. Head turning and shoulder shrug  were normal and symmetric. Motor: The motor testing reveals 5 over 5 strength  of all 4 extremities. Good symmetric motor tone is noted throughout.  Sensory: Sensory testing is intact to soft touch on all 4 extremities. No evidence of extinction is noted.  Coordination: Cerebellar testing reveals good finger-nose-finger and heel-to-shin bilaterally.  Gait and station: Gait is normal.  Reflexes: Deep tendon reflexes are symmetric and normal bilaterally.     DIAGNOSTIC DATA (LABS, IMAGING, TESTING) - I reviewed patient records, labs, notes, testing and imaging myself where available.  Lab Results  Component Value Date   WBC 6.1 10/25/2019   HGB 14.5 10/25/2019   HCT 43.4 10/25/2019   MCV 90.8 10/25/2019   PLT 274 10/25/2019      Component Value Date/Time   NA 141 10/25/2019 0817   K 4.3 10/25/2019 0817   CL 105 10/25/2019 0817   CO2 29 10/25/2019 0817   GLUCOSE 154 (H) 10/25/2019 0817   BUN 14 10/25/2019 0817   CREATININE 0.96 10/25/2019 0817   CALCIUM 9.6 10/25/2019 0817   PROT 6.7 10/25/2019 0817   ALBUMIN 4.0 06/17/2016 0945   AST 17 10/25/2019 0817   ALT 14 10/25/2019 0817   ALKPHOS 92 06/17/2016 0945   BILITOT 0.8 10/25/2019 0817   GFRNONAA 83 10/25/2019 0817   GFRAA 96 10/25/2019 0817   Lab Results  Component Value Date   CHOL 168 10/25/2019   HDL 37 (L) 10/25/2019   LDLCALC 112 (H) 10/25/2019   TRIG 88 10/25/2019   CHOLHDL 4.5 10/25/2019   Lab Results  Component Value Date   HGBA1C 5.4 10/25/2019   Lab Results  Component Value Date   VITAMINB12 383 10/25/2019   Lab Results  Component Value Date   TSH 1.53 10/25/2019      ASSESSMENT AND PLAN  64 y.o. year old male  has a past medical history of Acid reflux, Allergy, Anemia, Anxiety, History of hiatal hernia, Hypertension, Insomnia, RLS (restless legs syndrome), Sensorineural hearing loss, and Von Willebrand disease ([place]). here with   PLMD (periodic limb movement disorder)  Restless leg syndrome  Grant [name] is doing well on Neupro patch daily and ropinirole 0.25mg  BID as  needed. We will continue current treatment plan. He was encouraged to continue working on healthy lifestyle habits. He will follow up with me in 1 year, sooner if needed. He verbalizes understanding and agreement with this plan.   I spent 20 minutes of face-to-face and non-face-to-face time with patient.  This included previsit chart review, lab review, study review, order entry, electronic health record documentation, patient education.    Debbora Presto, MSN, FNP-C 01/03/2020, 9:13 AM  [facility] [address], [place], [place] [address] [phone]  I reviewed the above note and documentation by the Nurse Practitioner and agree with the history, exam, assessment and plan as outlined above. I was available for consultation. Star Age, MD, PhD [facility])

## 2020-01-07 ENCOUNTER — Other Ambulatory Visit: Payer: Self-pay

## 2020-01-07 ENCOUNTER — Ambulatory Visit (INDEPENDENT_AMBULATORY_CARE_PROVIDER_SITE_OTHER): Payer: Medicare Other | Admitting: Family Medicine

## 2020-01-07 VITALS — BP 128/70 | Temp 98.1°F | Ht 69.0 in | Wt 175.0 lb

## 2020-01-07 DIAGNOSIS — L281 Prurigo nodularis: Secondary | ICD-10-CM | POA: Diagnosis not present

## 2020-01-07 MED ORDER — TRIAMCINOLONE ACETONIDE 0.1 % EX CREA: 1.0000 "application " | TOPICAL_CREAM | Freq: Two times a day (BID) | CUTANEOUS | 0 refills | Status: DC

## 2020-01-07 NOTE — Progress Notes (Signed)
Subjective:    Patient ID: Grant [name], male    DOB: [birth year], 64 y.o.   MRN: [id number]  Patient has 2 or 3 inflamed scabs on the back of his neck right at his hairline.  He states that they come and go.  They typically occur once a month or so.  This time the scabs been back there for a couple weeks.  On examination there are 3 scabs.  Each is approximately 5 mm in diameter.  The surrounding skin is pink and inflamed from constant scratching and trauma.  The center is a scabbed abrasion from picking and manipulation.  The patient states that they are sore.  There are no other lesions throughout the scalp.  There are no other lesions anywhere else on the body Past Medical History:  Diagnosis Date  . Acid reflux   . Allergy   . Anemia   . Anxiety   . History of hiatal hernia   . Hypertension   . Insomnia   . RLS (restless legs syndrome)   . Sensorineural hearing loss   . Von Willebrand disease ([place])    PT STATES THIS IS MILD AND HAS NEVER HAD TO SEE HEMATOLOGIST-PT DID SAY THAT WHEN [place] HE BLED ALOT BUT IT WAS DUE TO NOT FOLLOWING POST OP INSTRUCTIONS    Past Surgical History:  Procedure Laterality Date  . CIRCUMCISION     AS AN ADULT  . COLONOSCOPY    . FOOT SURGERY Right    arch support  . HERNIA REPAIR    . INCISION AND DRAINAGE Right 02/04/2013   Procedure: INCISION AND DRAINAGE;  Surgeon: Linna Hoff, MD;  Location: [place];  Service: Orthopedics;  Laterality: Right;  . INGUINAL HERNIA REPAIR Bilateral 05/13/2018   Procedure: LAPAROSCOPIC BILATERAL INGUINAL HERNIA REPAIR;  Surgeon: Olean Ree, MD;  Location: [facility];  Service: General;  Laterality: Bilateral;  . INGUINAL HERNIA REPAIR Right 06/17/2019   Procedure: HERNIA REPAIR INGUINAL ADULT WITH MESH-- Recurrent;  Surgeon: Olean Ree, MD;  Location: [facility];  Service: General;  Laterality: Right;  . OPEN REDUCTION INTERNAL FIXATION (ORIF) FINGER WITH RADIAL BONE GRAFT Right  02/04/2013   Procedure: OPEN REDUCTION INTERNAL FIXATION (ORIF) right ring and small fingers;  Surgeon: Linna Hoff, MD;  Location: [place];  Service: Orthopedics;  Laterality: Right;   Current Outpatient Medications on File Prior to Visit  Medication Sig Dispense Refill  . amLODipine (NORVASC) 10 MG tablet Take 1 tablet (10 mg total) by mouth daily. 90 tablet 3  . EPINEPHRINE 0.3 mg/0.3 mL IJ SOAJ injection INJECT 0.3 ML (0.3 MG) INTO THE MUSCLE ONCE FOR 1 DOSE 2 each 11  . fluticasone (FLONASE) 50 MCG/ACT nasal spray Place 2 sprays into both nostrils daily.    Marland Kitchen lidocaine (XYLOCAINE) 5 % ointment Apply 1 application topically 4 (four) times daily as needed. 150 g 1  . losartan (COZAAR) 50 MG tablet TAKE 1 TABLET DAILY 90 tablet 1  . pantoprazole (PROTONIX) 40 MG tablet TAKE 1 TABLET DAILY 90 tablet 3  . rOPINIRole (REQUIP) 0.25 MG tablet Take 1 tablet (0.25 mg total) by mouth in the morning and at bedtime. 180 tablet 3  . Rotigotine (NEUPRO) 1 MG/24HR PT24 Place 1 patch (1 mg total) onto the skin daily at 4 PM. 90 patch 3   No current facility-administered medications on file prior to visit.   Allergies  Allergen Reactions  . Bee  Venom Anaphylaxis, Swelling and Rash  . Asa [Aspirin] Other (See Comments)    Gi upset   Social History   Socioeconomic History  . Marital status: Married    Spouse name: Not on file  . Number of children: 4  . Years of education: 13  . Highest education level: Not on file  Occupational History  . Occupation: Sales executive   Tobacco Use  . Smoking status: Former Smoker    Packs/day: 0.50    Types: Cigarettes    Quit date: 05/10/2016    Years since quitting: 3.6  . Smokeless tobacco: Never Used  Vaping Use  . Vaping Use: Former  . Quit date: 05/10/2016  Substance and Sexual Activity  . Alcohol use: No  . Drug use: Yes    Types: Marijuana    Comment: uses daily  . Sexual activity: Not on file  Other Topics Concern  . Not on file  Social  History Narrative   Denies caffeine use    Social Determinants of Health   Financial Resource Strain:   . Difficulty of Paying Living Expenses: Not on file  Food Insecurity:   . Worried About Charity fundraiser in the Last Year: Not on file  . Ran Out of Food in the Last Year: Not on file  Transportation Needs:   . Lack of Transportation (Medical): Not on file  . Lack of Transportation (Non-Medical): Not on file  Physical Activity:   . Days of Exercise per Week: Not on file  . Minutes of Exercise per Session: Not on file  Stress:   . Feeling of Stress : Not on file  Social Connections:   . Frequency of Communication with Friends and Family: Not on file  . Frequency of Social Gatherings with Friends and Family: Not on file  . Attends Religious Services: Not on file  . Active Member of Clubs or Organizations: Not on file  . Attends Archivist Meetings: Not on file  . Marital Status: Not on file  Intimate Partner Violence:   . Fear of Current or Ex-Partner: Not on file  . Emotionally Abused: Not on file  . Physically Abused: Not on file  . Sexually Abused: Not on file   Family History  Problem Relation Age of Onset  . Hyperlipidemia Mother   . Hyperlipidemia Father   . Colon cancer Neg Hx   . Esophageal cancer Neg Hx   . Rectal cancer Neg Hx   . Stomach cancer Neg Hx        Review of Systems  All other systems reviewed and are negative.      Objective:   Physical Exam Vitals reviewed.  Constitutional:      General: He is not in acute distress.    Appearance: He is well-developed. He is not diaphoretic.  HENT:     Head:   Cardiovascular:     Rate and Rhythm: Normal rate and regular rhythm.     Heart sounds: Normal heart sounds. No murmur heard.  No friction rub. No gallop.   Pulmonary:     Effort: Pulmonary effort is normal. No respiratory distress.     Breath sounds: Normal breath sounds. No wheezing or rales.  Chest:     Chest wall: No  tenderness.  Skin:    General: Skin is warm.     Findings: Rash present.  Neurological:     Mental Status: He is alert.     Motor: No abnormal muscle  tone.     Deep Tendon Reflexes: Reflexes are normal and symmetric.           Assessment & Plan:  Picker's nodules  Patient can use triamcinolone cream twice a day to calm the inflammation and irritation however the best treatment would be to avoid manipulating or traumatizing the areas with her fingernails.  Therefore I recommended the patient stop picking and scratching at the areas to allow them to heal

## 2020-02-21 ENCOUNTER — Other Ambulatory Visit: Payer: Self-pay | Admitting: Family Medicine

## 2020-03-16 ENCOUNTER — Encounter: Payer: Self-pay | Admitting: Family Medicine

## 2020-03-23 ENCOUNTER — Ambulatory Visit (INDEPENDENT_AMBULATORY_CARE_PROVIDER_SITE_OTHER): Payer: Medicare Other | Admitting: Family Medicine

## 2020-03-23 ENCOUNTER — Encounter: Payer: Self-pay | Admitting: Family Medicine

## 2020-03-23 ENCOUNTER — Ambulatory Visit: Payer: Medicare Other | Admitting: Family Medicine

## 2020-03-23 ENCOUNTER — Other Ambulatory Visit: Payer: Self-pay

## 2020-03-23 VITALS — BP 128/80 | HR 61 | Temp 98.2°F | Ht 69.0 in | Wt 173.0 lb

## 2020-03-23 DIAGNOSIS — L219 Seborrheic dermatitis, unspecified: Secondary | ICD-10-CM | POA: Diagnosis not present

## 2020-03-23 DIAGNOSIS — S63630A Sprain of interphalangeal joint of right index finger, initial encounter: Secondary | ICD-10-CM

## 2020-03-23 MED ORDER — FLUOCINOLONE ACETONIDE SCALP 0.01 % EX OIL: 1.0000 "application " | TOPICAL_OIL | Freq: Every day | CUTANEOUS | 0 refills | Status: DC

## 2020-03-23 NOTE — Progress Notes (Signed)
Subjective:    Patient ID: Grant [name], male    DOB: 11-21-1955, 65 y.o.   MRN: [id number]   Patient is a very pleasant 65 year old Caucasian gentleman who presents today with 3 separate issues.  First he has thick callus skin on the dorsum of his right great toe specifically the IP joint.  The skin is so thick and callused that when he bends his toes, the skin splits open and creates painful fissures.  There is no redness to suggest infection  3 weeks ago, the patient was picking up some objects at work.  He twisted his right index finger while picking up objects and carrying them.  Ever since that time he has had pain and stiffness around the right second PIP joint.  There is no bruising.  There is no swelling.  There is no erythema or warmth.  There is some tenderness to palpation along the radial side of the PIP joint.  There is no instability.  He has full flexion and extension of the PIP and DIP joints and MCP joints.  The finger is neurovascularly intact.  There is no evidence of a [place] finger or a mallet finger.  I suspect the patient may have strained some of the ligaments along the sides of the joint.  He continues to have erythematous nodules throughout his scalp primarily around his neck.  These are pink in color.  There is underlying dandruff and skin irritation and desquamation suggesting seborrheic dermatitis.  However the patient is picking at them constantly irritating them further creating picker's nodules. Past Medical History:  Diagnosis Date  . Acid reflux   . Allergy   . Anemia   . Anxiety   . History of hiatal hernia   . Hypertension   . Insomnia   . RLS (restless legs syndrome)   . Sensorineural hearing loss   . Von Willebrand disease ([place])    PT STATES THIS IS MILD AND HAS NEVER HAD TO SEE HEMATOLOGIST-PT DID SAY THAT WHEN HE WAS CIRCUMCIS[place] IN [place] YE[place] AGO HE BLED ALOT BUT IT WAS DUE TO NOT FOLLOWING POST OP INSTRUCTIONS    Past Surgical History:   Procedure Laterality Date  . CIRCUMCISION     AS AN ADULT  . COLONOSCOPY    . FOOT SURGERY Right    arch support  . HERNIA REPAIR    . INCISION AND DRAINAGE Right 02/04/2013   Procedure: INCISION AND DRAINAGE;  Surgeon: Linna Hoff, MD;  Location: [facility];  Service: Orthopedics;  Laterality: Right;  . INGUINAL HERNIA REPAIR Bilateral 05/13/2018   Procedure: LAPAROSCOPIC BILATERAL INGUINAL HERNIA REPAIR;  Surgeon: [place] Ree, MD;  Location: [facility];  Service: General;  Laterality: Bilateral;  . INGUINAL HERNIA REPAIR Right 06/17/2019   Procedure: HERNIA REPAIR INGUINAL ADULT WITH MESH-- Recurrent;  Surgeon: [place] Ree, MD;  Location: [facility];  Service: General;  Laterality: Right;  . OPEN REDUCTION INTERNAL FIXATION (ORIF) FINGER WITH RADIAL BONE GRAFT Right 02/04/2013   Procedure: OPEN REDUCTION INTERNAL FIXATION (ORIF) right ring and small fingers;  Surgeon: Linna Hoff, MD;  Location: [facility];  Service: Orthopedics;  Laterality: Right;   Current Outpatient Medications on File Prior to Visit  Medication Sig Dispense Refill  . amLODipine (NORVASC) 10 MG tablet Take 1 tablet (10 mg total) by mouth daily. 90 tablet 3  . EPINEPHRINE 0.3 mg/0.3 mL IJ SOAJ injection INJECT 0.3 ML (0.3 MG) INTO THE MUSCLE ONCE FOR 1 DOSE 2  each 11  . fluticasone (FLONASE) 50 MCG/ACT nasal spray Place 2 sprays into both nostrils daily.    Marland Kitchen lidocaine (XYLOCAINE) 5 % ointment Apply 1 application topically 4 (four) times daily as needed. 150 g 1  . losartan (COZAAR) 50 MG tablet TAKE 1 TABLET DAILY 90 tablet 3  . pantoprazole (PROTONIX) 40 MG tablet TAKE 1 TABLET DAILY 90 tablet 3  . rOPINIRole (REQUIP) 0.25 MG tablet Take 1 tablet (0.25 mg total) by mouth in the morning and at bedtime. 180 tablet 3  . Rotigotine (NEUPRO) 1 MG/24HR PT24 Place 1 patch (1 mg total) onto the skin daily at 4 PM. 90 patch 3  . triamcinolone cream (KENALOG) 0.1 % Apply 1 application topically 2 (two) times daily. 30 g 0   No  current facility-administered medications on file prior to visit.   Allergies  Allergen Reactions  . Bee Venom Anaphylaxis, Swelling and Rash  . Asa [Aspirin] Other (See Comments)    Gi upset   Social History   Socioeconomic History  . Marital status: Married    Spouse name: Not on file  . Number of children: 4  . Years of education: 7  . Highest education level: Not on file  Occupational History  . Occupation: Sales executive   Tobacco Use  . Smoking status: Former Smoker    Packs/day: 0.50    Types: Cigarettes    Quit date: 05/10/2016    Years since quitting: 3.8  . Smokeless tobacco: Never Used  Vaping Use  . Vaping Use: Former  . Quit date: 05/10/2016  Substance and Sexual Activity  . Alcohol use: No  . Drug use: Yes    Types: Marijuana    Comment: uses daily  . Sexual activity: Not on file  Other Topics Concern  . Not on file  Social History Narrative   Denies caffeine use    Social Determinants of Health   Financial Resource Strain: Not on file  Food Insecurity: Not on file  Transportation Needs: Not on file  Physical Activity: Not on file  Stress: Not on file  Social Connections: Not on file  Intimate Partner Violence: Not on file   Family History  Problem Relation Age of Onset  . Hyperlipidemia Mother   . Hyperlipidemia Father   . Colon cancer Neg Hx   . Esophageal cancer Neg Hx   . Rectal cancer Neg Hx   . Stomach cancer Neg Hx        Review of Systems  All other systems reviewed and are negative.      Objective:   Physical Exam Vitals reviewed.  Constitutional:      General: He is not in acute distress.    Appearance: He is well-developed. He is not diaphoretic.  Cardiovascular:     Rate and Rhythm: Normal rate and regular rhythm.     Heart sounds: Normal heart sounds. No murmur heard. No friction rub. No gallop.   Pulmonary:     Effort: Pulmonary effort is normal. No respiratory distress.     Breath sounds: Normal breath sounds. No  wheezing or rales.  Chest:     Chest wall: No tenderness.  Musculoskeletal:     Right hand: Bony tenderness present. No swelling or deformity. Normal range of motion. Normal strength. Normal sensation. There is no disruption of two-point discrimination. Normal capillary refill.       Hands:  Skin:    General: Skin is warm.     Findings: Erythema  and rash present. Rash is nodular, papular and scaling. Rash is not pustular.       Neurological:     Mental Status: He is alert.     Motor: No abnormal muscle tone.     Deep Tendon Reflexes: Reflexes are normal and symmetric.           Assessment & Plan:  Sprain of interphalangeal joint of right index finger, initial encounter  Seborrheic dermatitis of scalp  I believe the patient sprained the PIP joint on his right second finger.  I anticipate gradual improvement over the next 2 to 3 weeks.  At the present time I do not feel that an x-ray is warranted.  If the pain worsens we can certainly get an x-ray however the range of motion and normal and there is no palpable deformity.  I believe he also has seborrheic dermatitis of the scalp made worse by picking and irritation.  I will treat this with Derma-Smoothe once daily for 1 to 2 weeks and then once weekly thereafter.  Also recommended that he not pick at the areas.  I recommended that he follow-up with thick skin down on his toes with a pumice stone or an emery board and then apply petroleum jelly on a nightly basis to help improve the flexibility of the skin to prevent fissures and cracks

## 2020-04-04 ENCOUNTER — Other Ambulatory Visit: Payer: Self-pay | Admitting: Family Medicine

## 2020-04-14 ENCOUNTER — Other Ambulatory Visit: Payer: Self-pay | Admitting: Family Medicine

## 2020-04-26 ENCOUNTER — Encounter: Payer: Self-pay | Admitting: Family Medicine

## 2020-05-04 ENCOUNTER — Ambulatory Visit: Payer: Medicare Other | Admitting: Family Medicine

## 2020-05-05 ENCOUNTER — Encounter: Payer: Self-pay | Admitting: Family Medicine

## 2020-05-05 ENCOUNTER — Ambulatory Visit (INDEPENDENT_AMBULATORY_CARE_PROVIDER_SITE_OTHER): Payer: Medicare Other | Admitting: Family Medicine

## 2020-05-05 ENCOUNTER — Other Ambulatory Visit: Payer: Self-pay

## 2020-05-05 VITALS — BP 142/84 | HR 61 | Temp 98.6°F | Ht 69.0 in | Wt 176.0 lb

## 2020-05-05 DIAGNOSIS — G5622 Lesion of ulnar nerve, left upper limb: Secondary | ICD-10-CM | POA: Diagnosis not present

## 2020-05-05 DIAGNOSIS — N50811 Right testicular pain: Secondary | ICD-10-CM | POA: Diagnosis not present

## 2020-05-05 MED ORDER — DICLOFENAC SODIUM 1 % EX GEL: 2.0000 g | Freq: Four times a day (QID) | CUTANEOUS | 1 refills | Status: DC

## 2020-05-05 NOTE — Progress Notes (Signed)
Subjective:    Patient ID: Grant [name], male    DOB: Mar 04, 1956, 65 y.o.   MRN: [id number]  Patient presents today with 2 separate problems.  First he reports pain in his left elbow.  The pain is located just below and behind the medial epicondyle.  It is a lancing electrical pain.  I can elicit when I palpate the ulnar nerve in the cubital tunnel.  Patient jumps with gentle palpation in that area.  He also reports some numbness and tingling in his fifth and fourth digits in his left hand.  The pain radiates from his elbow to his fingers and the forearm.  Second problem is pain in his right testicle.  He has had this pain ever since he had hernia surgery.  The pain radiates over his medial thigh.  He also radiates into the right testicle.  He states that his testicle feels like he has been constantly "kicked in the testicle".  However the pain began after he had hernia surgery.  He denies any dysuria or urgency or frequency or hematuria Past Medical History:  Diagnosis Date  . Acid reflux   . Allergy   . Anemia   . Anxiety   . History of hiatal hernia   . Hypertension   . Insomnia   . RLS (restless legs syndrome)   . Sensorineural hearing loss   . Von Willebrand disease ([place])    PT STATES THIS IS MILD AND HAS NEVER HAD TO SEE HEMATOLOGIST-PT DID SAY THAT WHEN HE WAS CIRCUMCISED [place] YE[place] AGO HE BLED ALOT BUT IT WAS DUE TO NOT FOLLOWING POST OP INSTRUCTIONS    Past Surgical History:  Procedure Laterality Date  . CIRCUMCISION     AS AN ADULT  . COLONOSCOPY    . FOOT SURGERY Right    arch support  . HERNIA REPAIR    . INCISION AND DRAINAGE Right 02/04/2013   Procedure: INCISION AND DRAINAGE;  Surgeon: Linna Hoff, MD;  Location: [place] [facility];  Service: Orthopedics;  Laterality: Right;  . INGUINAL HERNIA REPAIR Bilateral 05/13/2018   Procedure: LAPAROSCOPIC BILATERAL INGUINAL HERNIA REPAIR;  Surgeon: [place] Ree, MD;  Location: [facility];  Service: General;  Laterality: Bilateral;   . INGUINAL HERNIA REPAIR Right 06/17/2019   Procedure: HERNIA REPAIR INGUINAL ADULT WITH MESH-- Recurrent;  Surgeon: [place] Ree, MD;  Location: [facility];  Service: General;  Laterality: Right;  . OPEN REDUCTION INTERNAL FIXATION (ORIF) FINGER WITH RADIAL BONE GRAFT Right 02/04/2013   Procedure: OPEN REDUCTION INTERNAL FIXATION (ORIF) right ring and small fingers;  Surgeon: Linna Hoff, MD;  Location: [facility];  Service: Orthopedics;  Laterality: Right;   Current Outpatient Medications on File Prior to Visit  Medication Sig Dispense Refill  . amLODipine (NORVASC) 10 MG tablet TAKE 1 TABLET DAILY 90 tablet 3  . EPINEPHRINE 0.3 mg/0.3 mL IJ SOAJ injection INJECT 0.3 ML (0.3 MG) INTO THE MUSCLE ONCE FOR 1 DOSE 2 each 11  . Fluocinolone Acetonide Scalp (DERMA-SMOOTHE/FS SCALP) 0.01 % OIL Apply 1 application topically daily. 120 mL 0  . fluticasone (FLONASE) 50 MCG/ACT nasal spray Place 2 sprays into both nostrils daily.    Marland Kitchen lidocaine (XYLOCAINE) 5 % ointment Apply 1 application topically 4 (four) times daily as needed. 150 g 1  . losartan (COZAAR) 50 MG tablet TAKE 1 TABLET DAILY 90 tablet 3  . pantoprazole (PROTONIX) 40 MG tablet TAKE 1 TABLET DAILY 90 tablet 3  . rOPINIRole (REQUIP)  0.25 MG tablet Take 1 tablet (0.25 mg total) by mouth in the morning and at bedtime. 180 tablet 3  . Rotigotine (NEUPRO) 1 MG/24HR PT24 Place 1 patch (1 mg total) onto the skin daily at 4 PM. 90 patch 3  . triamcinolone cream (KENALOG) 0.1 % Apply 1 application topically 2 (two) times daily. 30 g 0   No current facility-administered medications on file prior to visit.   Allergies  Allergen Reactions  . Bee Venom Anaphylaxis, Swelling and Rash  . Asa [Aspirin] Other (See Comments)    Gi upset   Social History   Socioeconomic History  . Marital status: Married    Spouse name: Not on file  . Number of children: 4  . Years of education: 26  . Highest education level: Not on file  Occupational History  .  Occupation: Sales executive   Tobacco Use  . Smoking status: Former Smoker    Packs/day: 0.50    Types: Cigarettes    Quit date: 05/10/2016    Years since quitting: 3.9  . Smokeless tobacco: Never Used  Vaping Use  . Vaping Use: Former  . Quit date: 05/10/2016  Substance and Sexual Activity  . Alcohol use: No  . Drug use: Yes    Types: Marijuana    Comment: uses daily  . Sexual activity: Not on file  Other Topics Concern  . Not on file  Social History Narrative   Denies caffeine use    Social Determinants of Health   Financial Resource Strain: Not on file  Food Insecurity: Not on file  Transportation Needs: Not on file  Physical Activity: Not on file  Stress: Not on file  Social Connections: Not on file  Intimate Partner Violence: Not on file   Family History  Problem Relation Age of Onset  . Hyperlipidemia Mother   . Hyperlipidemia Father   . Colon cancer Neg Hx   . Esophageal cancer Neg Hx   . Rectal cancer Neg Hx   . Stomach cancer Neg Hx        Review of Systems  All other systems reviewed and are negative.      Objective:   Physical Exam Vitals reviewed.  Constitutional:      General: He is not in acute distress.    Appearance: He is well-developed. He is not diaphoretic.  Cardiovascular:     Rate and Rhythm: Normal rate and regular rhythm.     Heart sounds: Normal heart sounds. No murmur heard. No friction rub. No gallop.   Pulmonary:     Effort: Pulmonary effort is normal. No respiratory distress.     Breath sounds: Normal breath sounds. No wheezing or rales.  Chest:     Chest wall: No tenderness.  Abdominal:     General: Bowel sounds are normal. There is no distension.     Palpations: Abdomen is soft. There is no mass.     Tenderness: There is no abdominal tenderness. There is no guarding or rebound.  Musculoskeletal:     Left elbow: No swelling, deformity or effusion. Normal range of motion. Tenderness present in medial epicondyle.     Right  upper leg: No swelling, deformity, tenderness or bony tenderness.       Legs:  Skin:    General: Skin is warm.  Neurological:     Mental Status: He is alert and oriented to person, place, and time.     Cranial Nerves: No cranial nerve deficit.  Motor: No abnormal muscle tone.     Coordination: Coordination normal.     Deep Tendon Reflexes: Reflexes are normal and symmetric.           Assessment & Plan:  Ulnar neuropathy of left upper extremity  Pain in right testicle - Plan: Ambulatory referral to Urology  I believe the pain in the patient's left elbow is ulnar neuropathy.  I recommended trying Voltaren gel 4 times daily for 2 weeks to calm the irritation.  If not improving at that time, I would recommend consultation with an orthopedic surgeon to discuss other treatment options.  I do not believe that this is medial epicondylitis as I am unable to elicit the pain by palpation on the medial epicondyles or with resisted flexion of the wrist.  I believe the pain in the right testicle is neuropathic pain.  Perhaps there is some scar tissue after the surgery putting pressure on a branch of the femoral nerve causing the pain to radiate down his medial thigh as well as into his right testicle.  This is the only explanation I think that would explain his pain pattern.  I do not believe that this is a testicular pathology however I will consult urology as the patient would like a second opinion.  He states he has been dealing with this pain for over a year.  The general surgeon who performed the surgery states that he just cannot take time for it to improve.  However this is frustrating the patient and he would like a second opinion

## 2020-05-16 ENCOUNTER — Encounter: Payer: Self-pay | Admitting: Family Medicine

## 2020-05-16 DIAGNOSIS — G5622 Lesion of ulnar nerve, left upper limb: Secondary | ICD-10-CM

## 2020-05-19 ENCOUNTER — Encounter: Payer: Self-pay | Admitting: Family Medicine

## 2020-05-31 ENCOUNTER — Ambulatory Visit (INDEPENDENT_AMBULATORY_CARE_PROVIDER_SITE_OTHER): Payer: Medicare Other | Admitting: Orthopaedic Surgery

## 2020-05-31 ENCOUNTER — Encounter: Payer: Self-pay | Admitting: Orthopaedic Surgery

## 2020-05-31 ENCOUNTER — Ambulatory Visit (INDEPENDENT_AMBULATORY_CARE_PROVIDER_SITE_OTHER): Payer: Medicare Other

## 2020-05-31 DIAGNOSIS — G5622 Lesion of ulnar nerve, left upper limb: Secondary | ICD-10-CM | POA: Diagnosis not present

## 2020-05-31 DIAGNOSIS — M7712 Lateral epicondylitis, left elbow: Secondary | ICD-10-CM | POA: Insufficient documentation

## 2020-05-31 MED ORDER — MELOXICAM 7.5 MG PO TABS: 7.5000 mg | ORAL_TABLET | Freq: Two times a day (BID) | ORAL | 2 refills | Status: DC | PRN

## 2020-05-31 NOTE — Progress Notes (Signed)
Office Visit Note   Patient: Grant [name]           Date of Birth: [birth year]           MRN: [id number] Visit Date: 05/31/2020              Requested by: Susy Frizzle, MD [id number],  [place] [address] PCP: Susy Frizzle, MD   Assessment & Plan: Visit Diagnoses:  1. Cubital tunnel syndrome on left   2. Lateral epicondylitis, left elbow     Plan: My impression is left cubital tunnel syndrome and lateral epicondylitis.  Recommended keeping the elbow straight during.  We will also obtain nerve conduction studies.  For the lateral epicondylitis we will send him to physical therapy with as well as counterforce brace and prescription for meloxicam.  We will see him back studies.  Follow-Up Instructions: Return if symptoms worsen or fail to improve.   Orders:  Orders Placed This Encounter  Procedures  . XR Elbow Complete Left (3+View)  . Ambulatory referral to Physical Therapy   Meds ordered this encounter  Medications  . meloxicam (MOBIC) 7.5 MG tablet    Sig: Take 1 tablet (7.5 mg total) by mouth 2 (two) times daily as needed for pain.    Dispense:  30 tablet    Refill:  2      Procedures: No procedures performed   Clinical Data: No additional findings.   Subjective: Chief Complaint  Patient presents with  . Left Elbow - Pain    Grant [name] is a 65 year old new patient here for evaluation left elbow pain referral from PCP for concern of cubital tunnel syndrome as well as lateral elbow pain.  Denies any injuries.  This has been going on for about 4 weeks.  He has a dull aching pain that is worse with use of the hand and wrist.  He is a full-time Clinical biochemist.  He use Voltaren gel which helps temporarily.  He is right-hand dominant.  He endorses numbness to the entire left small finger and the ulnar half of the ring finger.   Review of Systems  Constitutional: Negative.   All other systems reviewed and are negative.    Objective: Vital Signs:  There were no vitals taken for this visit.  Physical Exam Vitals and nursing note reviewed.  Constitutional:      Appearance: He is well-developed.  HENT:     Head: Normocephalic and atraumatic.  Eyes:     Pupils: Pupils are equal, round, and reactive to light.  Pulmonary:     Effort: Pulmonary effort is normal.  Abdominal:     Palpations: Abdomen is soft.  Musculoskeletal:        General: Normal range of motion.     Cervical back: Neck supple.  Skin:    General: Skin is warm.  Neurological:     Mental Status: He is alert and oriented to person, place, and time.  Psychiatric:        Behavior: Behavior normal.        Thought Content: Thought content normal.        Judgment: Judgment normal.     Ortho Exam Left elbow shows full range of motion.  Ulnar nerve is stable.  Negative Tinel at the cubital tunnel.  He has tenderness to the lateral epicondyle.  Radial tunnel nontender.  Pain with resisted ECRB extension.  Intrinsic atrophy.  Negative Wartenberg. Specialty Comments:  No specialty comments  available.  Imaging: XR Elbow Complete Left (3+View)  Result Date: 05/31/2020 No acute or structural abnormalities    PMFS History: Patient Active Problem List   Diagnosis Date Noted  . Lateral epicondylitis, left elbow 05/31/2020  . Recurrent inguinal hernia of right side without obstruction or gangrene   . Bilateral inguinal hernia 05/08/2018  . Cubital tunnel syndrome on left 04/09/2018  . Sensorineural hearing loss   . Restless leg syndrome 11/27/2017  . Flat feet, bilateral 04/03/2017  . Insomnia    Past Medical History:  Diagnosis Date  . Acid reflux   . Allergy   . Anemia   . Anxiety   . History of hiatal hernia   . Hypertension   . Insomnia   . RLS (restless legs syndrome)   . Sensorineural hearing loss   . Von Willebrand disease ([place])    PT STATES THIS IS MILD AND HAS NEVER HAD TO SEE HEMATOLOGIST-PT DID SAY THAT WHEN HE WAS CIRCUMCISED IN THE [place]  [place] HE BLED ALOT BUT IT WAS DUE TO NOT FOLLOWING POST OP INSTRUCTIONS     Family History  Problem Relation Age of Onset  . Hyperlipidemia Mother   . Hyperlipidemia Father   . Colon cancer Neg Hx   . Esophageal cancer Neg Hx   . Rectal cancer Neg Hx   . Stomach cancer Neg Hx     Past Surgical History:  Procedure Laterality Date  . CIRCUMCISION     AS AN ADULT  . COLONOSCOPY    . FOOT SURGERY Right    arch support  . HERNIA REPAIR    . INCISION AND DRAINAGE Right 02/04/2013   Procedure: INCISION AND DRAINAGE;  Surgeon: Linna Hoff, MD;  Location: [place];  Service: Orthopedics;  Laterality: Right;  . INGUINAL HERNIA REPAIR Bilateral 05/13/2018   Procedure: LAPAROSCOPIC BILATERAL INGUINAL HERNIA REPAIR;  Surgeon: [place] Ree, MD;  Location: [facility];  Service: General;  Laterality: Bilateral;  . INGUINAL HERNIA REPAIR Right 06/17/2019   Procedure: HERNIA REPAIR INGUINAL ADULT WITH MESH-- Recurrent;  Surgeon: [place] Ree, MD;  Location: [facility];  Service: General;  Laterality: Right;  . OPEN REDUCTION INTERNAL FIXATION (ORIF) FINGER WITH RADIAL BONE GRAFT Right 02/04/2013   Procedure: OPEN REDUCTION INTERNAL FIXATION (ORIF) right ring and small fingers;  Surgeon: [facility] Hoff, MD;  Location: [place];  Service: Orthopedics;  Laterality: Right;   Social History   Occupational History  . Occupation: Sales executive   Tobacco Use  . Smoking status: Former Smoker    Packs/day: 0.50    Types: Cigarettes    Quit date: 05/10/2016    Years since quitting: 4.0  . Smokeless tobacco: Never Used  Vaping Use  . Vaping Use: Former  . Quit date: 05/10/2016  Substance and Sexual Activity  . Alcohol use: No  . Drug use: Yes    Types: Marijuana    Comment: uses daily  . Sexual activity: Not on file

## 2020-05-31 NOTE — Addendum Note (Signed)
Addended by: Precious Bard on: 05/31/2020 02:02 PM   Modules accepted: Orders

## 2020-06-01 ENCOUNTER — Other Ambulatory Visit: Payer: Self-pay

## 2020-06-01 ENCOUNTER — Ambulatory Visit (INDEPENDENT_AMBULATORY_CARE_PROVIDER_SITE_OTHER): Payer: Medicare Other | Admitting: Rehabilitative and Restorative Service Providers"

## 2020-06-01 ENCOUNTER — Encounter: Payer: Self-pay | Admitting: Rehabilitative and Restorative Service Providers"

## 2020-06-01 DIAGNOSIS — M79602 Pain in left arm: Secondary | ICD-10-CM

## 2020-06-01 DIAGNOSIS — M6281 Muscle weakness (generalized): Secondary | ICD-10-CM | POA: Diagnosis not present

## 2020-06-01 NOTE — Patient Instructions (Addendum)
Access Code: [email or web address] URL: [email or web address] Date: 06/01/2020 Prepared by: Scot Jun  Exercises Standing Wrist Flexion Stretch - 2-3 x daily - 7 x weekly - 1 sets - 5 reps - 15 hold Eccentric Wrist Extension with Resistance - 2 x daily - 7 x weekly - 3 sets - 10 reps Forearm Pronation and Supination with Hammer - 2 x daily - 7 x weekly - 3 sets - 10 reps  Patient Education Trigger Point Dry Needling

## 2020-06-01 NOTE — Therapy (Signed)
[place], [place], [address] Phone: [phone]   Fax:  [phone]  Physical Therapy Evaluation  Patient Details  Name: Grant [name] MRN: [id number] Date of Birth: [birth year] Referring Provider (PT): Dr. Erlinda Hong   Encounter Date: 06/01/2020   PT End of Session - 06/01/20 0930    Visit Number 1    Number of Visits 20    Date for PT Re-Evaluation 08/10/20    Authorization Type Medicare, kx at 15    Progress Note Due on Visit 10    PT Start Time 0935    PT Stop Time 1010    PT Time Calculation (min) 35 min    Activity Tolerance Patient tolerated treatment well    Behavior During Therapy [facility] for tasks assessed/performed           Past Medical History:  Diagnosis Date  . Acid reflux   . Allergy   . Anemia   . Anxiety   . History of hiatal hernia   . Hypertension   . Insomnia   . RLS (restless legs syndrome)   . Sensorineural hearing loss   . Von Willebrand disease ([place])    PT STATES THIS IS MILD AND HAS NEVER HAD TO SEE HEMATOLOGIST-PT DID SAY THAT WHEN HE WAS [place] YE[place] AGO HE BLED ALOT BUT IT WAS DUE TO NOT FOLLOWING POST OP INSTRUCTIONS     Past Surgical History:  Procedure Laterality Date  . CIRCUMCISION     AS AN ADULT  . COLONOSCOPY    . FOOT SURGERY Right    arch support  . HERNIA REPAIR    . INCISION AND DRAINAGE Right 02/04/2013   Procedure: INCISION AND DRAINAGE;  Surgeon: Linna Hoff, MD;  Location: [place];  Service: Orthopedics;  Laterality: Right;  . INGUINAL HERNIA REPAIR Bilateral 05/13/2018   Procedure: LAPAROSCOPIC BILATERAL INGUINAL HERNIA REPAIR;  Surgeon: Olean Ree, MD;  Location: [facility];  Service: General;  Laterality: Bilateral;  . INGUINAL HERNIA REPAIR Right 06/17/2019   Procedure: HERNIA REPAIR INGUINAL ADULT WITH MESH-- Recurrent;  Surgeon: Olean Ree, MD;  Location: [facility];  Service: General;  Laterality: Right;  . OPEN REDUCTION INTERNAL FIXATION  (ORIF) FINGER WITH RADIAL BONE GRAFT Right 02/04/2013   Procedure: OPEN REDUCTION INTERNAL FIXATION (ORIF) right ring and small fingers;  Surgeon: Linna Hoff, MD;  Location: [place];  Service: Orthopedics;  Laterality: Right;    There were no vitals filed for this visit.    Subjective Assessment - 06/01/20 0933    Subjective Pt. stated symptoms about 1 month.  Had complaints in back of arm at night and then some numbness on posterior/lateral forearm.  During day having dull ache during the day in elbow.  Pt. stated lying or sitting still more than 5 mins creates sudden pain c active movement.  Waking at night due to pains.  Insidous onset of symptoms.  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    Limitations Lifting;House hold activities;Other (comment)   work   Patient Stated Goals Reduce pain, be able to work    Currently in Pain? Yes    Pain Score 4    pain at worst 8/10   Pain Location Arm   elbow/forearm   Pain Orientation Left    Pain Descriptors / Indicators Aching;Dull;Sharp;Numbness    Pain Type Chronic pain    Pain Radiating Towards 4th/5th digit    Pain Onset More  than a month ago    Pain Frequency Constant    Aggravating Factors  repetitive work, grasping, reaching/lifting at work.    Pain Relieving Factors arm at side    Effect of Pain on Daily Activities Work activity, sleeping              [facility] PT Assessment - 06/01/20 0001      Assessment   Medical Diagnosis Cubital tunnel syndrome, lateral epicondylitis Lt    Referring Provider (PT) Dr. Erlinda Hong    Onset Date/Surgical Date 04/11/20    Hand Dominance Right      Precautions   Precautions None    Required Braces or Orthoses Other Brace/Splint    Other Brace/Splint Has compression strap brace for elbow (not required)      Restrictions   Weight Bearing Restrictions No      Balance Screen   Has the patient fallen in the past 6 months Yes    How many times? 1    Has the patient had a  decrease in activity level because of a fear of falling?  No    Is the patient reluctant to leave their home because of a fear of falling?  No      Home Ecologist residence      Prior Function   Level of Independence Independent    Vocation Requirements Electrician      Cognition   Overall Cognitive Status Within Functional Limits for tasks assessed      Observation/Other Assessments   Focus on Therapeutic Outcomes (FOTO)  intake 52%, predicted 60 %      Sensation   Light Touch Appears Intact      ROM / Strength   AROM / PROM / Strength Strength;PROM;AROM      AROM   Overall AROM Comments Lt elbow/forearm AROM WFL at this time compared to Rt.  Pain noted in posterior lateral forearm c full flexion Lt wrist, pronation Lt wrist    AROM Assessment Site Elbow;Forearm;Wrist    Right/Left Elbow Left;Right    Right/Left Forearm Left;Right    Right/Left Wrist Right      PROM   Overall PROM Comments Overpressure wrist flexion produced concordant symptoms, WFL compared to Rt    PROM Assessment Site Elbow;Forearm;Wrist    Right/Left Elbow Left;Right    Right/Left Forearm Left;Right    Right/Left Wrist Left;Right      Strength   Strength Assessment Site Hand;Wrist;Forearm;Elbow    Right/Left Elbow Left;Right    Right Elbow Flexion 5/5    Right Elbow Extension 5/5    Right/Left Forearm Left;Right    Right Forearm Pronation 5/5    Right Forearm Supination 5/5    Left Forearm Pronation 4+/5    Left Forearm Supination 4+/5    Right/Left Wrist Left;Right    Right Wrist Flexion 5/5    Right Wrist Extension 5/5    Left Wrist Flexion 5/5    Left Wrist Extension 4/5   c pain   Right/Left hand Left;Right      Palpation   Palpation comment TrP noted throughout Lt wrist extensor group, finger extensors c concordant symptoms      Special Tests   Other special tests (-) Tinels medial elbow                      Objective measurements  completed on examination: See above findings.       [place] Adult PT Treatment/Exercise -  06/01/20 0001      Exercises   Exercises Wrist;Hand;Other Exercises    Other Exercises  HEP instruction/performance c cues for techniques, handout provided.  Trial set performed of each for comprehension and symptom assessment.  Consisted of eccentric wrist extension green band, wrist exensor stretch, supination/pronation c hammer      Manual Therapy   Manual therapy comments compresion to Lt wrist extensor group            Trigger Point Dry Needling - 06/01/20 0001    Consent Given? Yes    Education Handout Provided Yes    Muscles Treated Wrist/Hand Extensor digitorum;Extensor carpi ulnaris;Extensor carpi radialis longus/brevis    Extensor carpi radialis longus/brevis Response Twitch response elicited    Extensor digitorum Response Twitch response elicited    Extensor carpi ulnaris Response Twitch response elicited                PT Education - 06/01/20 0930    Education Details HEP, POC, DN    Person(s) Educated Patient    Methods Explanation;Demonstration;Verbal cues;Handout    Comprehension Returned demonstration;Verbalized understanding            PT Short Term Goals - 06/01/20 0931      PT SHORT TERM GOAL #1   Title Patient will demonstrate independent use of home exercise program to maintain progress from in clinic treatments.    Time 3    Period Weeks    Status New    Target Date 06/22/20             PT Long Term Goals - 06/01/20 1029      PT LONG TERM GOAL #1   Title Patient will demonstrate/report pain at worst less than or equal to 2/10 to facilitate minimal limitation in daily activity secondary to pain symptoms.    Time 10    Period Weeks    Status New    Target Date 08/10/20      PT LONG TERM GOAL #2   Title Patient will demonstrate independent use of home exercise program to facilitate ability to maintain/progress functional gains from skilled  physical therapy services.    Time 10    Period Weeks    Status New    Target Date 08/10/20      PT LONG TERM GOAL #3   Title Patient will demonstrate return to work/recreational activity at previous level of function without limitations secondary due to condition    Time 10    Period Weeks    Status New    Target Date 08/10/20      PT LONG TERM GOAL #4   Title Pt. will demonstrate FOTO outcome > = 60 to indicated reduced disability due to condition.    Time 10    Period Weeks    Status New    Target Date 08/10/20      PT LONG TERM GOAL #5   Title Pt. will demonstrate Lt wrist AROM, MMT 5/5 throughout s symptoms to facilitate usual daily and work activity at [employer].    Time 10    Period Weeks    Status New    Target Date 08/10/20                  Plan - 06/01/20 1003    Clinical Impression Statement Patient is a 65 y.o. who comes to clinic with complaints of Lt arm pain with mobility, strength deficits that impair their ability to perform usual daily  and recreational functional activities without increase difficulty/symptoms at this time.  Patient to benefit from skilled PT services to address impairments and limitations to improve to previous level of function without restriction secondary to condition.    Personal Factors and Comorbidities Comorbidity 2    Comorbidities HTN, anxiety    Examination-Activity Limitations Sleep;Bend;Carry;Lift;Reach Overhead    Examination-Participation Restrictions Occupation;Community Activity;Yard Work    Stability/Clinical Decision Making Stable/Uncomplicated    Designer, jewellery Low    Rehab Potential Good    PT Frequency --   2x   PT Duration Other (comment)   10 weeks   PT Treatment/Interventions ADLs/Self Care Home Management;Cryotherapy;Electrical Stimulation;Iontophoresis 4mg /ml Dexamethasone;Moist Heat;Balance training;Therapeutic exercise;Therapeutic activities;Functional mobility training;DME  Instruction;Ultrasound;Neuromuscular re-education;Patient/family education;Manual techniques;Passive range of motion;Spinal Manipulations;Joint Manipulations;Dry needling;Taping    PT Next Visit Plan Review HEP, DN/compression reassessment for lateral elbow.  Progress strengthening, grip strengthening    PT Home Exercise Plan [email or web address]    Consulted and Agree with Plan of Care Patient           Patient will benefit from skilled therapeutic intervention in order to improve the following deficits and impairments:  Impaired UE functional use,Pain,Decreased strength,Decreased activity tolerance,Increased fascial restricitons,Impaired flexibility,Impaired perceived functional ability,Decreased mobility  Visit Diagnosis: Pain in left arm  Muscle weakness (generalized)     Problem List Patient Active Problem List   Diagnosis Date Noted  . Lateral epicondylitis, left elbow 05/31/2020  . Recurrent inguinal hernia of right side without obstruction or gangrene   . Bilateral inguinal hernia 05/08/2018  . Cubital tunnel syndrome on left 04/09/2018  . Sensorineural hearing loss   . Restless leg syndrome 11/27/2017  . Flat feet, bilateral 04/03/2017  . Insomnia     Scot Jun, PT, DPT, OCS, ATC 06/01/20  10:32 AM    [facility] Physical Therapy [address] [place], [place], [address] Phone: [phone]   Fax:  [phone]  Name: Grant [name] MRN: [id number] Date of Birth: [birth year]

## 2020-06-08 ENCOUNTER — Encounter: Payer: Self-pay | Admitting: Physical Therapy

## 2020-06-08 ENCOUNTER — Ambulatory Visit (INDEPENDENT_AMBULATORY_CARE_PROVIDER_SITE_OTHER): Payer: Medicare Other | Admitting: Physical Therapy

## 2020-06-08 ENCOUNTER — Encounter: Payer: Self-pay | Admitting: Urology

## 2020-06-08 ENCOUNTER — Other Ambulatory Visit: Payer: Self-pay

## 2020-06-08 DIAGNOSIS — M79602 Pain in left arm: Secondary | ICD-10-CM | POA: Diagnosis not present

## 2020-06-08 DIAGNOSIS — M6281 Muscle weakness (generalized): Secondary | ICD-10-CM

## 2020-06-08 NOTE — Patient Instructions (Signed)
Access Code: [email or web address] URL: [email or web address] Date: 06/08/2020 Prepared by: Faustino Congress  Exercises Standing Wrist Flexion Stretch - 2-3 x daily - 7 x weekly - 1 sets - 5 reps - 15 hold Eccentric Wrist Extension with Resistance - 2 x daily - 7 x weekly - 3 sets - 10 reps Forearm Pronation and Supination with Hammer - 2 x daily - 7 x weekly - 3 sets - 10 reps Ulnar Nerve Flossing - 1 x daily - 7 x weekly - 10 reps - 1 sets - 3-5 sec hold  Patient Education Trigger Point Dry Needling

## 2020-06-08 NOTE — Therapy (Signed)
[place], [place], [address] Phone: [phone]   Fax:  [phone]  Physical Therapy Treatment  Patient Details  Name: Grant [name] MRN: [id number] Date of Birth: 1955-07-25 Referring Provider (PT): Dr. Erlinda Hong   Encounter Date: 06/08/2020   PT End of Session - 06/08/20 0923    Visit Number 2    Number of Visits 20    Date for PT Re-Evaluation 08/10/20    Authorization Type Medicare, kx at 15    Progress Note Due on Visit 10    PT Start Time 0845    PT Stop Time 0920    PT Time Calculation (min) 35 min    Activity Tolerance Patient tolerated treatment well    Behavior During Therapy [facility] for tasks assessed/performed           Past Medical History:  Diagnosis Date  . Acid reflux   . Allergy   . Anemia   . Anxiety   . History of hiatal hernia   . Hypertension   . Insomnia   . RLS (restless legs syndrome)   . Sensorineural hearing loss   . Von Willebrand disease ([place])    PT STATES THIS IS MILD AND HAS NEVER HAD TO SEE HEMATOLOGIST-PT DID SAY THAT WHEN [place] AGO HE BLED ALOT BUT IT WAS DUE TO NOT FOLLOWING POST OP INSTRUCTIONS     Past Surgical History:  Procedure Laterality Date  . CIRCUMCISION     AS AN ADULT  . COLONOSCOPY    . FOOT SURGERY Right    arch support  . HERNIA REPAIR    . INCISION AND DRAINAGE Right 02/04/2013   Procedure: INCISION AND DRAINAGE;  Surgeon: Linna Hoff, MD;  Location: [place];  Service: Orthopedics;  Laterality: Right;  . INGUINAL HERNIA REPAIR Bilateral 05/13/2018   Procedure: LAPAROSCOPIC BILATERAL INGUINAL HERNIA REPAIR;  Surgeon: Olean Ree, MD;  Location: [facility];  Service: General;  Laterality: Bilateral;  . INGUINAL HERNIA REPAIR Right 06/17/2019   Procedure: HERNIA REPAIR INGUINAL ADULT WITH MESH-- Recurrent;  Surgeon: Olean Ree, MD;  Location: [facility];  Service: General;  Laterality: Right;  . OPEN REDUCTION INTERNAL FIXATION (ORIF)  FINGER WITH RADIAL BONE GRAFT Right 02/04/2013   Procedure: OPEN REDUCTION INTERNAL FIXATION (ORIF) right ring and small fingers;  Surgeon: Linna Hoff, MD;  Location: [place];  Service: Orthopedics;  Laterality: Right;    There were no vitals filed for this visit.   Subjective Assessment - 06/08/20 0846    Subjective arm feels a lot better, still having some numbness in the ulnar nerve distribution.  not it feels achey.    Limitations Lifting;House hold activities;Other (comment)   work   Patient Stated Goals Reduce pain, be able to work    Currently in Pain? Yes    Pain Score 2     Pain Location Arm    Pain Orientation Left    Pain Descriptors / Indicators Aching    Pain Type Chronic pain    Pain Onset More than a month ago    Pain Frequency Constant    Aggravating Factors  repetitive work, reaching/lifting at work    Pain Relieving Factors holding arm at side, DN                             [facility] Adult PT Treatment/Exercise - 06/08/20 9675  Exercises   Exercises Elbow      Elbow Exercises   Forearm Supination Left;20 reps;Seated;Bar weights/barbell    Bar Weights/Barbell (Forearm Supination) 3 lbs    Forearm Pronation Left;20 reps;Seated;Bar weights/barbell    Bar Weights/Barbell (Forearm Pronation) 3 lbs    Wrist Extension Left;20 reps;Seated;Bar weights/barbell    Bar Weights/Barbell (Wrist Extension) 3 lbs      Wrist Exercises   Other wrist exercises wrist extensor stretch sitting 3x30 sec    Other wrist exercises ulnar nerve flossing x 10 reps, Lt      Manual Therapy   Manual therapy comments compresion to Lt wrist extensor group, brachioradialis            Trigger Point Dry Needling - 06/08/20 0921    Consent Given? Yes    Education Handout Provided Previously provided    Muscles Treated Upper Quadrant Brachioradialis    Muscles Treated Wrist/Hand Extensor carpi radialis longus/brevis;Extensor digitorum;Extensor carpi ulnaris     Electrical Stimulation Performed with Dry Needling Yes    E-stim with Dry Needling Details wrist extensor group to tolerance x 5 min    Brachioradialis Response Twitch response elicited    Extensor carpi radialis longus/brevis Response Twitch response elicited    Extensor digitorum Response Twitch response elicited    Extensor carpi ulnaris Response Twitch response elicited                PT Education - 06/08/20 0923    Education Details added ulnar nerve flossing    Person(s) Educated Patient    Methods Explanation;Demonstration;Handout    Comprehension Verbalized understanding;Returned demonstration;Need further instruction            PT Short Term Goals - 06/01/20 0931      PT SHORT TERM GOAL #1   Title Patient will demonstrate independent use of home exercise program to maintain progress from in clinic treatments.    Time 3    Period Weeks    Status New    Target Date 06/22/20             PT Long Term Goals - 06/01/20 1029      PT LONG TERM GOAL #1   Title Patient will demonstrate/report pain at worst less than or equal to 2/10 to facilitate minimal limitation in daily activity secondary to pain symptoms.    Time 10    Period Weeks    Status New    Target Date 08/10/20      PT LONG TERM GOAL #2   Title Patient will demonstrate independent use of home exercise program to facilitate ability to maintain/progress functional gains from skilled physical therapy services.    Time 10    Period Weeks    Status New    Target Date 08/10/20      PT LONG TERM GOAL #3   Title Patient will demonstrate return to work/recreational activity at previous level of function without limitations secondary due to condition    Time 10    Period Weeks    Status New    Target Date 08/10/20      PT LONG TERM GOAL #4   Title Pt. will demonstrate FOTO outcome > = 60 to indicated reduced disability due to condition.    Time 10    Period Weeks    Status New    Target Date  08/10/20      PT LONG TERM GOAL #5   Title Pt. will demonstrate Lt wrist AROM, MMT  5/5 throughout s symptoms to facilitate usual daily and work activity at [employer].    Time 10    Period Weeks    Status New    Target Date 08/10/20                 Plan - 06/08/20 0539    Clinical Impression Statement Pt with positive response to DN last session so repeated today adding estim to wrist extensors.  Added ulnar nerve flossing today.  Will continue to benefit from PT to maximize function.    Personal Factors and Comorbidities Comorbidity 2    Comorbidities HTN, anxiety    Examination-Activity Limitations Sleep;Bend;Carry;Lift;Reach Overhead    Examination-Participation Restrictions Occupation;Community Activity;Yard Work    Stability/Clinical Decision Making Stable/Uncomplicated    Rehab Potential Good    PT Frequency --   2x   PT Duration Other (comment)   10 weeks   PT Treatment/Interventions ADLs/Self Care Home Management;Cryotherapy;Electrical Stimulation;Iontophoresis 4mg /ml Dexamethasone;Moist Heat;Balance training;Therapeutic exercise;Therapeutic activities;Functional mobility training;DME Instruction;Ultrasound;Neuromuscular re-education;Patient/family education;Manual techniques;Passive range of motion;Spinal Manipulations;Joint Manipulations;Dry needling;Taping    PT Next Visit Plan Review HEP, DN/compression reassessment for lateral elbow.  Progress strengthening, grip strengthening    PT Home Exercise Plan [email or web address]    Consulted and Agree with Plan of Care Patient           Patient will benefit from skilled therapeutic intervention in order to improve the following deficits and impairments:  Impaired UE functional use,Pain,Decreased strength,Decreased activity tolerance,Increased fascial restricitons,Impaired flexibility,Impaired perceived functional ability,Decreased mobility  Visit Diagnosis: Pain in left arm  Muscle weakness (generalized)     Problem List Patient  Active Problem List   Diagnosis Date Noted  . Lateral epicondylitis, left elbow 05/31/2020  . Recurrent inguinal hernia of right side without obstruction or gangrene   . Bilateral inguinal hernia 05/08/2018  . Cubital tunnel syndrome on left 04/09/2018  . Sensorineural hearing loss   . Restless leg syndrome 11/27/2017  . Flat feet, bilateral 04/03/2017  . Insomnia       Laureen Abrahams, PT, DPT 06/08/20 9:25 AM    [facility] Physical Therapy [address] [place], [place], [address] Phone: [phone]   Fax:  [phone]  Name: JOANGEL VANOSDOL MRN: [id number] Date of Birth: 03-16-1955

## 2020-06-09 ENCOUNTER — Encounter: Payer: Self-pay | Admitting: Urology

## 2020-06-09 ENCOUNTER — Ambulatory Visit: Payer: Medicare Other | Admitting: Family Medicine

## 2020-06-09 ENCOUNTER — Ambulatory Visit (INDEPENDENT_AMBULATORY_CARE_PROVIDER_SITE_OTHER): Payer: Medicare Other | Admitting: Urology

## 2020-06-09 ENCOUNTER — Telehealth: Payer: Self-pay | Admitting: Family Medicine

## 2020-06-09 VITALS — BP 147/70 | HR 56 | Temp 98.1°F | Ht 69.0 in | Wt 168.0 lb

## 2020-06-09 DIAGNOSIS — N50819 Testicular pain, unspecified: Secondary | ICD-10-CM

## 2020-06-09 LAB — URINALYSIS, ROUTINE W REFLEX MICROSCOPIC
Bilirubin, UA: NEGATIVE
Glucose, UA: NEGATIVE
Ketones, UA: NEGATIVE
Leukocytes,UA: NEGATIVE
Nitrite, UA: NEGATIVE
Protein,UA: NEGATIVE
RBC, UA: NEGATIVE
Specific Gravity, UA: 1.015 (ref 1.005–1.030)
Urobilinogen, Ur: 0.2 mg/dL (ref 0.2–1.0)
pH, UA: 7 (ref 5.0–7.5)

## 2020-06-09 MED ORDER — MELOXICAM 7.5 MG PO TABS: 7.5000 mg | ORAL_TABLET | Freq: Every day | ORAL | 2 refills | Status: DC

## 2020-06-09 NOTE — Progress Notes (Signed)
Urological Symptom Review  Patient is experiencing the following symptoms: Frequent urination  Get up at night to urinate   Review of Systems  Gastrointestinal (upper)  : Indigestion/heartburn  Gastrointestinal (lower) : Negative for lower GI symptoms  Constitutional : Fatigue  Skin: Negative for skin symptoms  Eyes: Negative for eye symptoms  Ear/Nose/Throat : Sinus problems  Hematologic/Lymphatic: Negative for Hematologic/Lymphatic symptoms  Cardiovascular : Negative for cardiovascular symptoms  Respiratory : Negative for respiratory symptoms  Endocrine: Negative for endocrine symptoms  Musculoskeletal: Negative for musculoskeletal symptoms  Neurological: Negative for neurological symptoms  Psychologic: Negative for psychiatric symptoms

## 2020-06-09 NOTE — Telephone Encounter (Signed)
FYI - patient called to complain about the lack of response about his request to schedule his appt on 4/1 with a different provider - patient specified he wanted a second opinion. Patient stated he's unhappy with service he has received here and will be searching for another provider. Patient also stated he will be calling [facility] to complain.

## 2020-06-09 NOTE — Progress Notes (Signed)
06/09/2020 10:36 AM   Grant [name] October 06, 1955 [id number]  Referring provider: Susy Frizzle, MD [address] [place],   [address]  Chief Complaint  Patient presents with  . New Patient (Initial Visit)    Pain in right testicle    HPI: Grant [name] is a 65yo here for evaluation of right testicular pain. He underwent went hernia repair at [facility] 2 years and then underwent revision after mesh migration 1 year ago. Since the first hernia repair he is having dull aching right testicular pain and then he developed intermittent sharp testicular pain that radiates to abdomen. NO other exacerbating/allevaiting events. No other associated symptoms.    PMH: Past Medical History:  Diagnosis Date  . Acid reflux   . Allergy   . Anemia   . Anxiety   . History of hiatal hernia   . Hypertension   . Insomnia   . RLS (restless legs syndrome)   . Sensorineural hearing loss   . Von Willebrand disease ([place])    PT STATES THIS IS MILD AND HAS NEVER HAD TO SEE HEMATOLOGIST-PT DID SAY THAT WHEN [place] AGO HE BLED ALOT BUT IT WAS DUE TO NOT FOLLOWING POST OP INSTRUCTIONS     Surgical History: Past Surgical History:  Procedure Laterality Date  . CIRCUMCISION     AS AN ADULT  . COLONOSCOPY    . FOOT SURGERY Right    arch support  . HERNIA REPAIR    . INCISION AND DRAINAGE Right 02/04/2013   Procedure: INCISION AND DRAINAGE;  Surgeon: Linna Hoff, MD;  Location: [place];  Service: Orthopedics;  Laterality: Right;  . INGUINAL HERNIA REPAIR Bilateral 05/13/2018   Procedure: LAPAROSCOPIC BILATERAL INGUINAL HERNIA REPAIR;  Surgeon: Olean Ree, MD;  Location: [facility];  Service: General;  Laterality: Bilateral;  . INGUINAL HERNIA REPAIR Right 06/17/2019   Procedure: HERNIA REPAIR INGUINAL ADULT WITH MESH-- Recurrent;  Surgeon: Olean Ree, MD;  Location: [facility];  Service: General;  Laterality: Right;  . OPEN REDUCTION INTERNAL FIXATION (ORIF) FINGER  WITH RADIAL BONE GRAFT Right 02/04/2013   Procedure: OPEN REDUCTION INTERNAL FIXATION (ORIF) right ring and small fingers;  Surgeon: Linna Hoff, MD;  Location: [place];  Service: Orthopedics;  Laterality: Right;    Home Medications:  Allergies as of 06/09/2020      Reactions   Bee Venom Anaphylaxis, Swelling, Rash   Asa [aspirin] Other (See Comments)   Gi upset      Medication List       Accurate as of June 09, 2020 10:36 AM. If you have any questions, ask your nurse or doctor.        amLODipine 10 MG tablet Commonly known as: NORVASC TAKE 1 TABLET DAILY   diclofenac Sodium 1 % Gel Commonly known as: Voltaren Apply 2 g topically 4 (four) times daily.   EPINEPHrine 0.3 mg/0.3 mL Soaj injection Commonly known as: EPI-PEN INJECT 0.3 ML (0.3 MG) INTO THE MUSCLE ONCE FOR 1 DOSE   Fluocinolone Acetonide Scalp 0.01 % Oil Commonly known as: Derma-Smoothe/FS Scalp Apply 1 application topically daily.   fluticasone 50 MCG/ACT nasal spray Commonly known as: FLONASE Place 2 sprays into both nostrils daily.   lidocaine 5 % ointment Commonly known as: XYLOCAINE Apply 1 application topically 4 (four) times daily as needed.   losartan 50 MG tablet Commonly known as: COZAAR TAKE 1 TABLET DAILY   meloxicam 7.5 MG tablet Commonly  known as: Mobic Take 1 tablet (7.5 mg total) by mouth 2 (two) times daily as needed for pain.   Neupro 1 MG/24HR Pt24 Generic drug: Rotigotine Place 1 patch (1 mg total) onto the skin daily at 4 PM.   pantoprazole 40 MG tablet Commonly known as: PROTONIX TAKE 1 TABLET DAILY   rOPINIRole 0.25 MG tablet Commonly known as: Requip Take 1 tablet (0.25 mg total) by mouth in the morning and at bedtime.   triamcinolone 0.1 % Commonly known as: KENALOG Apply 1 application topically 2 (two) times daily.       Allergies:  Allergies  Allergen Reactions  . Bee Venom Anaphylaxis, Swelling and Rash  . Asa [Aspirin] Other (See Comments)    Gi upset     Family History: Family History  Problem Relation Age of Onset  . Hyperlipidemia Mother   . Hyperlipidemia Father   . Colon cancer Neg Hx   . Esophageal cancer Neg Hx   . Rectal cancer Neg Hx   . Stomach cancer Neg Hx     Social History:  reports that he quit smoking about 4 years ago. His smoking use included cigarettes. He smoked 0.50 packs per day. He has never used smokeless tobacco. He reports current drug use. Drug: Marijuana. He reports that he does not drink alcohol.  ROS: All other review of systems were reviewed and are negative except what is noted above in HPI  Physical Exam: BP (!) 147/70   Pulse (!) 56   Temp 98.1 F (36.7 C)   Ht 5\' 9"  (1.753 m)   Wt 168 lb (76.2 kg)   BMI 24.81 kg/m   Constitutional:  Alert and oriented, No acute distress. HEENT: [place] AT, moist mucus membranes.  Trachea midline, no masses. Cardiovascular: No clubbing, cyanosis, or edema. Respiratory: Normal respiratory effort, no increased work of breathing. GI: Abdomen is soft, nontender, nondistended, no abdominal masses GU: No CVA tenderness. Circumcised phallus. No masses/lesions on penis, testis, scrotum.  Lymph: No cervical or inguinal lymphadenopathy. Skin: No rashes, bruises or suspicious lesions. Neurologic: Grossly intact, no focal deficits, moving all 4 extremities. Psychiatric: Normal mood and affect.  Laboratory Data: Lab Results  Component Value Date   WBC 6.1 10/25/2019   HGB 14.5 10/25/2019   HCT 43.4 10/25/2019   MCV 90.8 10/25/2019   PLT 274 10/25/2019    Lab Results  Component Value Date   CREATININE 0.96 10/25/2019    Lab Results  Component Value Date   PSA 1.1 10/25/2019   PSA 1.8 05/01/2018   PSA 1.4 09/15/2017    No results found for: TESTOSTERONE  Lab Results  Component Value Date   HGBA1C 5.4 10/25/2019    Urinalysis No results found for: COLORURINE, APPEARANCEUR, LABSPEC, PHURINE, GLUCOSEU, HGBUR, BILIRUBINUR, KETONESUR, PROTEINUR,  UROBILINOGEN, NITRITE, LEUKOCYTESUR  No results found for: LABMICR, [place], RBCUA, LABEPIT, MUCUS, BACTERIA  Pertinent Imaging:  No results found for this or any previous visit.  Results for orders placed during the hospital encounter of 12/07/08  US VenoUS Imaging Bilateral  Narrative Clinical Data: Bilateral foot pain, numbness, tingling  BILATERAL LOWER EXTREMITY VENOUS DOPPLER ULTRASOUND  Technique: Gray-scale sonography with compression, as well as color and duplex ultrasound, were performed to evaluate the deep venous system from the level of the common femoral vein through the popliteal and proximal calf veins.  Comparison:   none  Findings:  Normal compressibility of  the common femoral, superficial femoral, and popliteal veins, as well as the proximal calf  veins.  No filling defects to suggest DVT on grayscale or color Doppler imaging.  Doppler waveforms show normal direction of venous flow, normal respiratory phasicity and response to augmentation.  IMPRESSION: No evidence of  lower extremity deep vein thrombosis.  Provider: Ulyess Mort  No results found for this or any previous visit.  No results found for this or any previous visit.  No results found for this or any previous visit.  No results found for this or any previous visit.  No results found for this or any previous visit.  No results found for this or any previous visit.   Assessment & Plan:    1. Pain in testicle, unspecified laterality -meloxicam 7.5mg  daily   No follow-ups on file.  Nicolette Bang, MD  [facility] [place]

## 2020-06-12 NOTE — Telephone Encounter (Signed)
Patient called back 4/1 to reschedule with NP Noemi Chapel.

## 2020-06-16 ENCOUNTER — Ambulatory Visit (INDEPENDENT_AMBULATORY_CARE_PROVIDER_SITE_OTHER): Payer: Medicare Other | Admitting: Nurse Practitioner

## 2020-06-16 ENCOUNTER — Encounter: Payer: Medicare Other | Admitting: Rehabilitative and Restorative Service Providers"

## 2020-06-16 ENCOUNTER — Encounter: Payer: Self-pay | Admitting: Nurse Practitioner

## 2020-06-16 ENCOUNTER — Ambulatory Visit: Payer: Medicare Other | Admitting: Family Medicine

## 2020-06-16 ENCOUNTER — Other Ambulatory Visit: Payer: Self-pay

## 2020-06-16 VITALS — BP 126/76 | HR 64 | Temp 98.6°F | Ht 69.0 in | Wt 172.6 lb

## 2020-06-16 DIAGNOSIS — Z87891 Personal history of nicotine dependence: Secondary | ICD-10-CM

## 2020-06-16 DIAGNOSIS — L989 Disorder of the skin and subcutaneous tissue, unspecified: Secondary | ICD-10-CM

## 2020-06-16 DIAGNOSIS — R591 Generalized enlarged lymph nodes: Secondary | ICD-10-CM

## 2020-06-16 LAB — CBC WITH DIFFERENTIAL/PLATELET
Absolute Monocytes: 554 cells/uL (ref 200–950)
Basophils Absolute: 50 cells/uL (ref 0–200)
Basophils Relative: 0.7 %
Eosinophils Absolute: 252 cells/uL (ref 15–500)
Eosinophils Relative: 3.5 %
HCT: 42.8 % (ref 38.5–50.0)
Hemoglobin: 14.3 g/dL (ref 13.2–17.1)
Lymphs Abs: 1699 cells/uL (ref 850–3900)
MCH: 30.3 pg (ref 27.0–33.0)
MCHC: 33.4 g/dL (ref 32.0–36.0)
MCV: 90.7 fL (ref 80.0–100.0)
MPV: 9.2 fL (ref 7.5–12.5)
Monocytes Relative: 7.7 %
Neutro Abs: 4644 cells/uL (ref 1500–7800)
Neutrophils Relative %: 64.5 %
Platelets: 266 10*3/uL (ref 140–400)
RBC: 4.72 10*6/uL (ref 4.20–5.80)
RDW: 12.2 % (ref 11.0–15.0)
Total Lymphocyte: 23.6 %
WBC: 7.2 10*3/uL (ref 3.8–10.8)

## 2020-06-16 NOTE — Progress Notes (Signed)
Subjective:    Patient ID: Grant [name], male    DOB: 1955/11/16, 65 y.o.   MRN: [id number]  HPI: Grant [name] is a 65 y.o. male presenting for skin lesion.  Chief Complaint  Patient presents with  . Sore    Seeking 2nd opinion for the sores on the back of the head, told he had ingrown hair but sores are beginning to spread. Has pain and bleeding. Using psoriasis shampoo for tx. Sores have been there 1 month.   SKIN LESION Duration: months Location: back of head; in scalp moving up Painful: comes and goes; when it flares up feels like tight bumps Itching: yes ; sometimes Bleeding: some, oozes clear liquid mostly Onset: comes and goes Context: bigger  Associated signs and symptoms: no fever, no new joint pain,  Reports glands are swollen at back of neck History of skin cancer: no History of precancerous skin lesions: no Family history of skin cancer: no Treatments attempted: Derma-smoothe (did not help), Tar-based oil works best Was using shampoo every other day or so.  Also reports he feels he has some swollen lymph nodes around his neck.  Allergies  Allergen Reactions  . Bee Venom Anaphylaxis, Swelling and Rash  . Asa [Aspirin] Other (See Comments)    Gi upset    Outpatient Encounter Medications as of 06/16/2020  Medication Sig  . amLODipine (NORVASC) 10 MG tablet TAKE 1 TABLET DAILY  . diclofenac Sodium (VOLTAREN) 1 % GEL Apply 2 g topically 4 (four) times daily.  Marland Kitchen EPINEPHRINE 0.3 mg/0.3 mL IJ SOAJ injection INJECT 0.3 ML (0.3 MG) INTO THE MUSCLE ONCE FOR 1 DOSE  . Fluocinolone Acetonide Scalp (DERMA-SMOOTHE/FS SCALP) 0.01 % OIL Apply 1 application topically daily.  . fluticasone (FLONASE) 50 MCG/ACT nasal spray Place 2 sprays into both nostrils daily.  Marland Kitchen lidocaine (XYLOCAINE) 5 % ointment Apply 1 application topically 4 (four) times daily as needed.  Marland Kitchen losartan (COZAAR) 50 MG tablet TAKE 1 TABLET DAILY  . meloxicam (MOBIC) 7.5 MG tablet Take 1 tablet (7.5 mg  total) by mouth daily.  . pantoprazole (PROTONIX) 40 MG tablet TAKE 1 TABLET DAILY  . rOPINIRole (REQUIP) 0.25 MG tablet Take 1 tablet (0.25 mg total) by mouth in the morning and at bedtime.  . Rotigotine (NEUPRO) 1 MG/24HR PT24 Place 1 patch (1 mg total) onto the skin daily at 4 PM.  . triamcinolone cream (KENALOG) 0.1 % Apply 1 application topically 2 (two) times daily.   No facility-administered encounter medications on file as of 06/16/2020.    Patient Active Problem List   Diagnosis Date Noted  . Lateral epicondylitis, left elbow 05/31/2020  . Recurrent inguinal hernia of right side without obstruction or gangrene   . Bilateral inguinal hernia 05/08/2018  . Cubital tunnel syndrome on left 04/09/2018  . Sensorineural hearing loss   . Restless leg syndrome 11/27/2017  . Flat feet, bilateral 04/03/2017  . Insomnia     Past Medical History:  Diagnosis Date  . Acid reflux   . Allergy   . Anemia   . Anxiety   . History of hiatal hernia   . Hypertension   . Insomnia   . RLS (restless legs syndrome)   . Sensorineural hearing loss   . Von Willebrand disease ([place])    PT STATES THIS IS MILD AND HAS NEVER HAD TO SEE HEMATOLOGIST-PT DID SAY THAT WHEN HE WAS [place] YE[place] AGO HE BLED ALOT BUT IT WAS DUE TO  NOT FOLLOWING POST OP INSTRUCTIONS     Relevant past medical, surgical, family and social history reviewed and updated as indicated. Interim medical history since our last visit reviewed.  Review of Systems  Constitutional: Positive for diaphoresis (night sweat). Negative for activity change, appetite change, fatigue, fever and unexpected weight change.  HENT: Negative.  Negative for congestion, ear pain, mouth sores, rhinorrhea, sinus pressure, sinus pain, sore throat and trouble swallowing.   Gastrointestinal: Negative.  Negative for abdominal pain, anal bleeding, blood in stool, constipation, diarrhea, nausea and vomiting.  Genitourinary: Negative.  Negative for  enuresis, frequency, hematuria, penile discharge, penile pain, penile swelling and testicular pain.  Skin: Positive for rash. Negative for color change, pallor and wound.  Neurological: Negative.   Hematological: Positive for adenopathy. Does not bruise/bleed easily.  Psychiatric/Behavioral: Negative.    Per HPI unless specifically indicated above     Objective:    BP 126/76   Pulse 64   Temp 98.6 F (37 C)   Ht 5\' 9"  (1.753 m)   Wt 172 lb 9.6 oz (78.3 kg)   SpO2 97%   BMI 25.49 kg/m   Wt Readings from Last 3 Encounters:  06/16/20 172 lb 9.6 oz (78.3 kg)  06/09/20 168 lb (76.2 kg)  05/05/20 176 lb (79.8 kg)    Physical Exam Vitals and nursing note reviewed.  Constitutional:      General: He is not in acute distress.    Appearance: Normal appearance. He is not toxic-appearing.  HENT:     Head: Normocephalic and atraumatic.     Right Ear: External ear normal.     Left Ear: External ear normal.     Mouth/Throat:     Mouth: Mucous membranes are moist.     Pharynx: Oropharynx is clear. No oropharyngeal exudate or posterior oropharyngeal erythema.  Eyes:     General: No scleral icterus.    Extraocular Movements: Extraocular movements intact.  Neck:     Thyroid: No thyroid mass.  Chest:  Breasts:     Right: Supraclavicular adenopathy present.     Left: Supraclavicular adenopathy present.      Comments: Approximately ~ 0.2 cm x 0.2 cm rubbery, mobile, nodule noted in right supraclavicular area; 0.1 cm x 0.1 cm rubbery, mobile, nodule noted to left supraclavicular area Musculoskeletal:     Cervical back: Normal range of motion and neck supple. No pain with movement. Normal range of motion.  Lymphadenopathy:     Cervical: Cervical adenopathy present.     Left cervical: Posterior cervical adenopathy present.     Upper Body:     Right upper body: Supraclavicular adenopathy present.     Left upper body: Supraclavicular adenopathy present.  Skin:    General: Skin is warm  and dry.     Findings: Lesion present.          Comments: Multiple flesh-colored nodules palpated to posterior scalp, many firm, nontender, without fluctuance.  No erythema, drainage, oozing, or odor noted.   Neurological:     Mental Status: He is alert and oriented to person, place, and time.  Psychiatric:        Mood and Affect: Mood normal.        Behavior: Behavior normal.        Thought Content: Thought content normal.        Judgment: Judgment normal.       Assessment & Plan:  1. Lymphadenopathy of head and neck Will obtain blood counts and ultrasound  of heck and neck.  Concern for malignancy given history of smoking and night sweats.  No other B symptoms today.  - CBC with Differential - US Soft Tissue Head/Neck (NON-THYROID); Future  2. Stopped smoking with greater than 20 pack year history Will order low dose CT scan for lung cancer screening given smoking history.    - CT CHEST LUNG CA SCREEN LOW DOSE W/O CM; Future  3. Lesion of skin of scalp Acute.  Unclear etiology.  Multiple raised nodules palpated on examination today.  Will place referral to Dermatology.  Okay to continue with psoriasis shampoo OTC as this seems to help for now.  - Ambulatory referral to Dermatology    Follow up plan: Return if symptoms worsen or fail to improve.

## 2020-06-17 ENCOUNTER — Encounter: Payer: Self-pay | Admitting: Nurse Practitioner

## 2020-06-19 ENCOUNTER — Other Ambulatory Visit: Payer: Self-pay

## 2020-06-19 ENCOUNTER — Encounter: Payer: Self-pay | Admitting: Nurse Practitioner

## 2020-06-19 ENCOUNTER — Ambulatory Visit (INDEPENDENT_AMBULATORY_CARE_PROVIDER_SITE_OTHER): Payer: Medicare Other | Admitting: Rehabilitative and Restorative Service Providers"

## 2020-06-19 ENCOUNTER — Telehealth: Payer: Self-pay | Admitting: Dermatology

## 2020-06-19 ENCOUNTER — Encounter: Payer: Self-pay | Admitting: Rehabilitative and Restorative Service Providers"

## 2020-06-19 DIAGNOSIS — M6281 Muscle weakness (generalized): Secondary | ICD-10-CM

## 2020-06-19 DIAGNOSIS — M79602 Pain in left arm: Secondary | ICD-10-CM | POA: Diagnosis not present

## 2020-06-19 NOTE — Telephone Encounter (Signed)
Patient is calling for a referral appointment from Noemi Chapel, NP.  Patient does not want to wait until 11/2020 for appointment, so wants referral sent back to [facility].

## 2020-06-19 NOTE — Therapy (Signed)
[facility] [place], [place], [address] Phone: [phone]   Fax:  [phone]  Physical Therapy Treatment  Patient Details  Name: Grant [name] MRN: [id number] Date of Birth: 05-Mar-1956 Referring Provider (PT): Dr. Erlinda Hong   Encounter Date: 06/19/2020   PT End of Session - 06/19/20 1317    Visit Number 3    Number of Visits 20    Date for PT Re-Evaluation 08/10/20    Authorization Type Medicare, kx at 15    Progress Note Due on Visit 10    PT Start Time 1258    PT Stop Time 1337    PT Time Calculation (min) 39 min    Activity Tolerance Patient tolerated treatment well    Behavior During Therapy [facility] for tasks assessed/performed           Past Medical History:  Diagnosis Date  . Acid reflux   . Allergy   . Anemia   . Anxiety   . History of hiatal hernia   . Hypertension   . Insomnia   . RLS (restless legs syndrome)   . Sensorineural hearing loss   . Von Willebrand disease ([place])    PT STATES THIS IS MILD AND HAS NEVER HAD TO SEE HEMATOLOGIST-PT DID SAY THAT WHEN [place] HE BLED ALOT BUT IT WAS DUE TO NOT FOLLOWING POST OP INSTRUCTIONS     Past Surgical History:  Procedure Laterality Date  . CIRCUMCISION     AS AN ADULT  . COLONOSCOPY    . FOOT SURGERY Right    arch support  . HERNIA REPAIR    . INCISION AND DRAINAGE Right 02/04/2013   Procedure: INCISION AND DRAINAGE;  Surgeon: Linna Hoff, MD;  Location: [place];  Service: Orthopedics;  Laterality: Right;  . INGUINAL HERNIA REPAIR Bilateral 05/13/2018   Procedure: LAPAROSCOPIC BILATERAL INGUINAL HERNIA REPAIR;  Surgeon: Olean Ree, MD;  Location: [facility];  Service: General;  Laterality: Bilateral;  . INGUINAL HERNIA REPAIR Right 06/17/2019   Procedure: HERNIA REPAIR INGUINAL ADULT WITH MESH-- Recurrent;  Surgeon: Olean Ree, MD;  Location: [facility];  Service: General;  Laterality: Right;  . OPEN REDUCTION INTERNAL FIXATION (ORIF)  FINGER WITH RADIAL BONE GRAFT Right 02/04/2013   Procedure: OPEN REDUCTION INTERNAL FIXATION (ORIF) right ring and small fingers;  Surgeon: Linna Hoff, MD;  Location: [place];  Service: Orthopedics;  Laterality: Right;    There were no vitals filed for this visit.   Subjective Assessment - 06/19/20 1300    Subjective Pt. indicated 2/10 or so pain as ache today.  Pt. stated no nighttime complaints.  Pt. stated perhaps some improvement in numbness complaints but still notices it in forearm.    Limitations Lifting;House hold activities;Other (comment)   work   Patient Stated Goals Reduce pain, be able to work    Currently in Pain? Yes    Pain Score 2     Pain Location Arm    Pain Orientation Left    Pain Descriptors / Indicators Aching    Pain Onset More than a month ago    Pain Frequency Intermittent    Aggravating Factors  work can create soreness    Pain Relieving Factors treatment              [facility] PT Assessment - 06/19/20 0001      Assessment   Medical Diagnosis Cubital tunnel syndrome, lateral epicondylitis Lt    Referring  Provider (PT) Dr. Erlinda Hong    Onset Date/Surgical Date 04/11/20    Hand Dominance Right    Next MD Visit 5      Strength   Left Forearm Pronation 5/5    Left Forearm Supination 5/5    Left Wrist Extension 5/5    Left Wrist Radial Deviation 5/5   mild discomfrt posterior forearm                        [facility] Adult PT Treatment/Exercise - 06/19/20 0001      Exercises   Other Exercises  HEP review      Elbow Exercises   Other elbow exercises UBE fwd/back 3 mins each way    Other elbow exercises tband rows green 20x      Wrist Exercises   Other wrist exercises wrist extensor stretch 3x30 sec    Other wrist exercises ulnar nerve flossing  2x 10 reps, Lt      Manual Therapy   Manual therapy comments compresion to Lt wrist extensor group, brachioradialis                  PT Education - 06/19/20 1314    Education Details  Triceps extension added, review of last visits new HEP    Person(s) Educated Patient    Methods Explanation;Demonstration;Verbal cues;Handout    Comprehension Verbalized understanding;Returned demonstration            PT Short Term Goals - 06/19/20 1302      PT SHORT TERM GOAL #1   Title Patient will demonstrate independent use of home exercise program to maintain progress from in clinic treatments.    Time 3    Period Weeks    Status On-going    Target Date 06/22/20             PT Long Term Goals - 06/01/20 1029      PT LONG TERM GOAL #1   Title Patient will demonstrate/report pain at worst less than or equal to 2/10 to facilitate minimal limitation in daily activity secondary to pain symptoms.    Time 10    Period Weeks    Status New    Target Date 08/10/20      PT LONG TERM GOAL #2   Title Patient will demonstrate independent use of home exercise program to facilitate ability to maintain/progress functional gains from skilled physical therapy services.    Time 10    Period Weeks    Status New    Target Date 08/10/20      PT LONG TERM GOAL #3   Title Patient will demonstrate return to work/recreational activity at previous level of function without limitations secondary due to condition    Time 10    Period Weeks    Status New    Target Date 08/10/20      PT LONG TERM GOAL #4   Title Pt. will demonstrate FOTO outcome > = 60 to indicated reduced disability due to condition.    Time 10    Period Weeks    Status New    Target Date 08/10/20      PT LONG TERM GOAL #5   Title Pt. will demonstrate Lt wrist AROM, MMT 5/5 throughout s symptoms to facilitate usual daily and work activity at [employer].    Time 10    Period Weeks    Status New    Target Date 08/10/20  Plan - 06/19/20 1316    Clinical Impression Statement Continued use of myofascial release techniques.  Reviewed ulnar nerve flossing as Pt. indicated he was unable to do while caring  for wife during her medical complications.  Reviewed and added triceps loading as well.    Personal Factors and Comorbidities Comorbidity 2    Comorbidities HTN, anxiety    Examination-Activity Limitations Sleep;Bend;Carry;Lift;Reach Overhead    Examination-Participation Restrictions Occupation;Community Activity;Yard Work    Stability/Clinical Decision Making Stable/Uncomplicated    Rehab Potential Good    PT Frequency --   2x   PT Duration Other (comment)   10 weeks   PT Treatment/Interventions ADLs/Self Care Home Management;Cryotherapy;Electrical Stimulation;Iontophoresis 4mg /ml Dexamethasone;Moist Heat;Balance training;Therapeutic exercise;Therapeutic activities;Functional mobility training;DME Instruction;Ultrasound;Neuromuscular re-education;Patient/family education;Manual techniques;Passive range of motion;Spinal Manipulations;Joint Manipulations;Dry needling;Taping    PT Next Visit Plan DN/compression reassessment for lateral elbow.  Progress strengthening, grip strengthening, WB (push up etc)    PT Home Exercise Plan [email or web address]    Consulted and Agree with Plan of Care Patient           Patient will benefit from skilled therapeutic intervention in order to improve the following deficits and impairments:  Impaired UE functional use,Pain,Decreased strength,Decreased activity tolerance,Increased fascial restricitons,Impaired flexibility,Impaired perceived functional ability,Decreased mobility  Visit Diagnosis: Pain in left arm  Muscle weakness (generalized)     Problem List Patient Active Problem List   Diagnosis Date Noted  . Lateral epicondylitis, left elbow 05/31/2020  . Recurrent inguinal hernia of right side without obstruction or gangrene   . Bilateral inguinal hernia 05/08/2018  . Cubital tunnel syndrome on left 04/09/2018  . Sensorineural hearing loss   . Restless leg syndrome 11/27/2017  . Flat feet, bilateral 04/03/2017  . Insomnia     Scot Jun, PT, DPT,  OCS, ATC 06/19/20  1:31 PM    [facility] [address] [place], [place], [address] Phone: [phone]   Fax:  [phone]  Name: DENNISE BAMBER MRN: [id number] Date of Birth: 1955-11-19

## 2020-06-19 NOTE — Patient Instructions (Signed)
Access Code: [email or web address] URL: [email or web address] Date: 06/19/2020 Prepared by: Scot Jun  Exercises Standing Wrist Flexion Stretch - 2-3 x daily - 7 x weekly - 1 sets - 5 reps - 15 hold Eccentric Wrist Extension with Resistance - 2 x daily - 7 x weekly - 3 sets - 10 reps Forearm Pronation and Supination with Hammer - 2 x daily - 7 x weekly - 3 sets - 10 reps Ulnar Nerve Flossing - 1 x daily - 7 x weekly - 10 reps - 1 sets - 3-5 sec hold Standing Elbow Extension with Anchored Resistance at Wall - 1 x daily - 7 x weekly - 3 sets - 10 reps  Patient Education Trigger Point Dry Needling

## 2020-06-20 ENCOUNTER — Other Ambulatory Visit: Payer: Self-pay | Admitting: Family Medicine

## 2020-06-20 NOTE — Telephone Encounter (Signed)
Per referral in Epic this provider referred patient to [facility].

## 2020-06-26 ENCOUNTER — Ambulatory Visit (INDEPENDENT_AMBULATORY_CARE_PROVIDER_SITE_OTHER): Payer: Medicare Other | Admitting: Rehabilitative and Restorative Service Providers"

## 2020-06-26 ENCOUNTER — Ambulatory Visit
Admission: RE | Admit: 2020-06-26 | Discharge: 2020-06-26 | Disposition: A | Discharge status: Home / Self Care | Payer: Medicare Other | Source: Ambulatory Visit | Attending: Nurse Practitioner | Admitting: Nurse Practitioner

## 2020-06-26 ENCOUNTER — Encounter: Payer: Self-pay | Admitting: Rehabilitative and Restorative Service Providers"

## 2020-06-26 ENCOUNTER — Other Ambulatory Visit: Payer: Self-pay

## 2020-06-26 DIAGNOSIS — M6281 Muscle weakness (generalized): Secondary | ICD-10-CM | POA: Diagnosis not present

## 2020-06-26 DIAGNOSIS — R591 Generalized enlarged lymph nodes: Secondary | ICD-10-CM

## 2020-06-26 DIAGNOSIS — M79602 Pain in left arm: Secondary | ICD-10-CM

## 2020-06-26 DIAGNOSIS — Z87891 Personal history of nicotine dependence: Secondary | ICD-10-CM

## 2020-06-26 NOTE — Therapy (Signed)
[facility] [place], [place], [address] Phone: [phone]   Fax:  [phone]  Physical Therapy Treatment  Patient Details  Name: Grant [name] MRN: [id number] Date of Birth: [birth year] Referring Provider (PT): Dr. Erlinda Hong   Encounter Date: 06/26/2020   PT End of Session - 06/26/20 1303    Visit Number 4    Number of Visits 20    Date for PT Re-Evaluation 08/10/20    Authorization Type Medicare, kx at 15    Progress Note Due on Visit 10    PT Start Time 1300    Activity Tolerance Patient tolerated treatment well    Behavior During Therapy [facility] for tasks assessed/performed           Past Medical History:  Diagnosis Date  . Acid reflux   . Allergy   . Anemia   . Anxiety   . History of hiatal hernia   . Hypertension   . Insomnia   . RLS (restless legs syndrome)   . Sensorineural hearing loss   . Von Willebrand disease ([place])    PT STATES THIS IS MILD AND HAS NEVER HAD TO SEE HEMATOLOGIST-PT DID SAY THAT WHEN HE WAS CIRC[place] IN [place] [employer] YE[place] AGO HE BLED ALOT BUT IT WAS DUE TO NOT FOLLOWING POST OP INSTRUCTIONS     Past Surgical History:  Procedure Laterality Date  . CIRCUMCISION     AS AN ADULT  . COLONOSCOPY    . FOOT SURGERY Right    arch support  . HERNIA REPAIR    . INCISION AND DRAINAGE Right 02/04/2013   Procedure: INCISION AND DRAINAGE;  Surgeon: Linna Hoff, MD;  Location: [facility];  Service: Orthopedics;  Laterality: Right;  . INGUINAL HERNIA REPAIR Bilateral 05/13/2018   Procedure: LAPAROSCOPIC BILATERAL INGUINAL HERNIA REPAIR;  Surgeon: Olean Ree, MD;  Location: [facility];  Service: General;  Laterality: Bilateral;  . INGUINAL HERNIA REPAIR Right 06/17/2019   Procedure: HERNIA REPAIR INGUINAL ADULT WITH MESH-- Recurrent;  Surgeon: Olean Ree, MD;  Location: [facility];  Service: General;  Laterality: Right;  . OPEN REDUCTION INTERNAL FIXATION (ORIF) FINGER WITH RADIAL BONE GRAFT Right 02/04/2013    Procedure: OPEN REDUCTION INTERNAL FIXATION (ORIF) right ring and small fingers;  Surgeon: Linna Hoff, MD;  Location: [facility];  Service: Orthopedics;  Laterality: Right;    There were no vitals filed for this visit.   Subjective Assessment - 06/26/20 1302    Subjective Pt. stated no real pain today to report.  Pt. stated he has felt improvements, rating Global Rating of Change at +5 quite a bit better at this time.    Limitations Lifting;House hold activities;Other (comment)   work   Patient Stated Goals Reduce pain, be able to work    Currently in Pain? No/denies    Pain Score 0-No pain    Pain Onset More than a month ago              [facility] PT Assessment - 06/26/20 0001      Assessment   Medical Diagnosis Cubital tunnel syndrome, lateral epicondylitis Lt    Referring Provider (PT) Dr. Erlinda Hong    Onset Date/Surgical Date 04/11/20    Hand Dominance Right      Observation/Other Assessments   Focus on Therapeutic Outcomes (FOTO)  update 86%      AROM   Overall AROM Comments AROM WFL no complaints      Strength   Left Forearm Pronation  5/5    Left Forearm Supination 5/5    Left Wrist Flexion 5/5    Left Wrist Extension 5/5    Right Hand Grip (lbs) 91, 80    Left Hand Grip (lbs) 73, 78                         [facility] Adult PT Treatment/Exercise - 06/26/20 0001      Elbow Exercises   Elbow Extension Left   3 x 10 blue band   Forearm Supination Left;20 reps    Bar Weights/Barbell (Forearm Supination) 4 lbs    Forearm Pronation Left;20 reps    Bar Weights/Barbell (Forearm Pronation) 4 lbs    Other elbow exercises UBE fwd/back 3 mins each way Lvl 3.5    Other elbow exercises tband rows green 2 x 15, tband gh ext green 2 x 15                    PT Short Term Goals - 06/26/20 1303      PT SHORT TERM GOAL #1   Title Patient will demonstrate independent use of home exercise program to maintain progress from in clinic treatments.    Time 3    Period  Weeks    Status Achieved    Target Date 06/22/20             PT Long Term Goals - 06/26/20 1316      PT LONG TERM GOAL #1   Title Patient will demonstrate/report pain at worst less than or equal to 2/10 to facilitate minimal limitation in daily activity secondary to pain symptoms.    Time 10    Period Weeks    Status On-going      PT LONG TERM GOAL #2   Title Patient will demonstrate independent use of home exercise program to facilitate ability to maintain/progress functional gains from skilled physical therapy services.    Time 10    Period Weeks    Status Achieved      PT LONG TERM GOAL #3   Title Patient will demonstrate return to work/recreational activity at previous level of function without limitations secondary due to condition    Time 10    Period Weeks    Status Achieved      PT LONG TERM GOAL #4   Title Pt. will demonstrate FOTO outcome > = 60 to indicated reduced disability due to condition.    Time 10    Period Weeks    Status Achieved      PT LONG TERM GOAL #5   Title Pt. will demonstrate Lt wrist AROM, MMT 5/5 throughout s symptoms to facilitate usual daily and work activity at [employer].    Time 10    Period Weeks    Status New                 Plan - 06/26/20 1319    Clinical Impression Statement Pt. has reported marked reduction in presentation symptoms over last few visits with objective data improved to reach most of the established goals to this point.  FOTO update drastically improved as well today.  Pt. to return in 2 weeks for follow up regarding maintaining ability c use of HEP.    Personal Factors and Comorbidities Comorbidity 2    Comorbidities HTN, anxiety    Examination-Activity Limitations Sleep;Bend;Carry;Lift;Reach Overhead    Examination-Participation Restrictions Occupation;Community Activity;Yard Work    [employer]  Stable/Uncomplicated    Rehab Potential Good    PT Frequency --   2x   PT Duration Other  (comment)   10 weeks   PT Treatment/Interventions ADLs/Self Care Home Management;Cryotherapy;Electrical Stimulation;Iontophoresis 4mg /ml Dexamethasone;Moist Heat;Balance training;Therapeutic exercise;Therapeutic activities;Functional mobility training;DME Instruction;Ultrasound;Neuromuscular re-education;Patient/family education;Manual techniques;Passive range of motion;Spinal Manipulations;Joint Manipulations;Dry needling;Taping    PT Next Visit Plan Reassess response to reduced frequency, HEP reliance increase, possible D/C.    PT Home Exercise Plan [id number]    Consulted and Agree with Plan of Care Patient           Patient will benefit from skilled therapeutic intervention in order to improve the following deficits and impairments:  Impaired UE functional use,Pain,Decreased strength,Decreased activity tolerance,Increased fascial restricitons,Impaired flexibility,Impaired perceived functional ability,Decreased mobility  Visit Diagnosis: Pain in left arm  Muscle weakness (generalized)     Problem List Patient Active Problem List   Diagnosis Date Noted  . Lateral epicondylitis, left elbow 05/31/2020  . Recurrent inguinal hernia of right side without obstruction or gangrene   . Bilateral inguinal hernia 05/08/2018  . Cubital tunnel syndrome on left 04/09/2018  . Sensorineural hearing loss   . Restless leg syndrome 11/27/2017  . Flat feet, bilateral 04/03/2017  . Insomnia    Scot Jun, PT, DPT, OCS, ATC 06/26/20  1:36 PM    [facility] [address] [place], [place], [address] Phone: [phone]   Fax:  [phone]  Name: Grant [name] MRN: [id number] Date of Birth: 10/26/1955

## 2020-06-27 ENCOUNTER — Encounter: Payer: Self-pay | Admitting: Nurse Practitioner

## 2020-06-27 ENCOUNTER — Telehealth: Payer: Self-pay | Admitting: Nurse Practitioner

## 2020-06-27 DIAGNOSIS — I700 Atherosclerosis of aorta: Secondary | ICD-10-CM

## 2020-06-27 DIAGNOSIS — J432 Centrilobular emphysema: Secondary | ICD-10-CM

## 2020-06-27 MED ORDER — ATORVASTATIN CALCIUM 10 MG PO TABS: 10.0000 mg | ORAL_TABLET | Freq: Every day | ORAL | 1 refills | Status: DC

## 2020-06-27 MED ORDER — ATORVASTATIN CALCIUM 10 MG PO TABS: 10.0000 mg | ORAL_TABLET | Freq: Every day | ORAL | 0 refills | Status: DC

## 2020-06-27 NOTE — Telephone Encounter (Signed)
Called patient and addressed questions - see telephone encounter

## 2020-06-27 NOTE — Telephone Encounter (Signed)
Called patient and discussed CT chest results.  Evidence of plaque build up in main artery - aortic atherosclerosis.  Also evidence of early COPD.  Discussed starting statin therapy to reduce risk of cardiovascular event.  Patient is agreeable.  Will send atorvastatin 10 mg daily to pharmacy and f/u in 1 month.  In regard to early COPD, discussed treating based on symptoms.  The 10-year ASCVD risk score Mikey Bussing DC Brooke Bonito., et al., 2013) is: 16.2%   Values used to calculate the score:     Age: 3 years     Sex: Male     Is Non-Hispanic African American: No     Diabetic: No     Tobacco smoker: No     Systolic Blood Pressure: 396 mmHg     Is BP treated: Yes     HDL Cholesterol: 37 mg/dL     Total Cholesterol: 168 mg/dL

## 2020-06-29 ENCOUNTER — Inpatient Hospital Stay: Admission: RE | Admit: 2020-06-29 | Payer: Medicare Other | Source: Ambulatory Visit

## 2020-07-03 ENCOUNTER — Encounter: Payer: Medicare Other | Admitting: Rehabilitative and Restorative Service Providers"

## 2020-07-10 ENCOUNTER — Encounter: Payer: Self-pay | Admitting: Physical Medicine and Rehabilitation

## 2020-07-10 ENCOUNTER — Ambulatory Visit (INDEPENDENT_AMBULATORY_CARE_PROVIDER_SITE_OTHER): Payer: Medicare Other | Admitting: Physical Medicine and Rehabilitation

## 2020-07-10 ENCOUNTER — Other Ambulatory Visit: Payer: Self-pay

## 2020-07-10 DIAGNOSIS — R202 Paresthesia of skin: Secondary | ICD-10-CM

## 2020-07-10 NOTE — Progress Notes (Signed)
Pt state he has pain, numbness and tingling in his left elbow and hand. Pt state reaching over head and having to twist of the hand makes the pain worse. Pt state he feeling numbness and tingling in his left hand between his pinky and ring finger. Pt state take pain meds to her ease his pain. Pt state he right handed.  Numeric Pain Rating Scale and Functional Assessment Average Pain 2   In the last MONTH (on 0-10 scale) has pain interfered with the following?  1. General activity like being  able to carry out your everyday physical activities such as walking, climbing stairs, carrying groceries, or moving a chair?  Rating(7)

## 2020-07-10 NOTE — Progress Notes (Signed)
Grant [name] - 65 y.o. male MRN [id number]  Date of birth: [birth year]  Office Visit Note: Visit Date: 07/10/2020 PCP: Eulogio Bear, NP Referred by: Leandrew Koyanagi, MD  Subjective: Chief Complaint  Patient presents with  . Left Elbow - Pain  . Left Wrist - Pain  . Left Hand - Pain, Numbness, Tingling  . Left Arm - Pain   HPI:  Grant [name] is a 65 y.o. male who comes in today at the request of Dr. Eduard Roux for electrodiagnostic study of the Left upper extremities.  Patient is Right hand dominant.  He reports several months of chronic worsening lateral elbow pain but also with pain numbness and tingling into the ulnar digits of the left hand.  He reports left elbow pain on the left as well.  He reports that reaching overhead and twisting the hand makes the pain worse in the elbow but does not really change the numbness.  He reports at night that if he rolls onto the elbow for any length of time he will get increased numbness into the ulnar digits.  Pain medication seems to help.  No prior electrodiagnostic studies.  No frank radicular symptoms.   ROS Otherwise per HPI.  Assessment & Plan: Visit Diagnoses:    ICD-10-CM   1. Paresthesia of skin  R20.2 NCV with EMG (electromyography)    Plan: Impression: The above electrodiagnostic study is ABNORMAL and reveals evidence of a moderate left ulnar nerve neuropathy at the elbow (cubital tunnel syndrome) affecting motor components. There is no significant electrodiagnostic evidence of any other focal nerve entrapment, brachial plexopathy or cervical radiculopathy.   Recommendations: 1.  Follow-up with referring physician. 2.  Continue current management of symptoms. 3.  Suggest surgical evaluation.  Meds & Orders: No orders of the defined types were placed in this encounter.   Orders Placed This Encounter  Procedures  . NCV with EMG (electromyography)    Follow-up: Return in 2 weeks (on 07/24/2020) for Eduard Roux, MD.    Procedures: No procedures performed  EMG & NCV Findings: Evaluation of the left ulnar motor nerve showed decreased conduction velocity (A Elbow-B Elbow, 42 m/s).  All remaining nerves (as indicated in the following tables) were within normal limits.    All examined muscles (as indicated in the following table) showed no evidence of electrical instability.    Impression: The above electrodiagnostic study is ABNORMAL and reveals evidence of a moderate left ulnar nerve neuropathy at the elbow (cubital tunnel syndrome) affecting motor components. There is no significant electrodiagnostic evidence of any other focal nerve entrapment, brachial plexopathy or cervical radiculopathy.   Recommendations: 1.  Follow-up with referring physician. 2.  Continue current management of symptoms. 3.  Suggest surgical evaluation.  ___________________________ Laurence Spates FAAPMR Board Certified, American Board of Physical Medicine and Rehabilitation    Nerve Conduction Studies Anti Sensory Summary Table   Stim Site NR Peak (ms) Norm Peak (ms) P-T Amp (V) Norm P-T Amp Site1 Site2 Delta-P (ms) Dist (cm) Vel (m/s) Norm Vel (m/s)  Left Median Acr Palm Anti Sensory (2nd Digit)  32.8C  Wrist    3.4 <3.6 21.9 >10 Wrist Palm 1.6 0.0    Palm    1.8 <2.0 23.7         Left Radial Anti Sensory (Base 1st Digit)  31.3C  Wrist    2.3 <3.1 14.2  Wrist Base 1st Digit 2.3 0.0    Left Ulnar Anti Sensory (5th Digit)  32.7C  Wrist    3.6 <3.7 23.3 >15.0 Wrist 5th Digit 3.6 14.0 39 >38   Motor Summary Table   Stim Site NR Onset (ms) Norm Onset (ms) O-P Amp (mV) Norm O-P Amp Site1 Site2 Delta-0 (ms) Dist (cm) Vel (m/s) Norm Vel (m/s)  Left Median Motor (Abd Poll Brev)  31.7C  Wrist    4.1 <4.2 5.4 >5 Elbow Wrist 4.6 23.0 50 >50  Elbow    8.7  5.9         Left Ulnar Motor (Abd Dig Min)  32.1C  Wrist    2.9 <4.2 7.7 >3 B Elbow Wrist 4.0 21.0 53 >53  B Elbow    6.9  7.3  A Elbow B Elbow 2.4 10.0 *42 >53  A Elbow     9.3  6.6          EMG   Side Muscle Nerve Root Ins Act Fibs Psw Amp Dur Poly Recrt Int Fraser Din Comment  Left Abd Poll Brev Median C8-T1 Nml Nml Nml Nml Nml 0 Nml Nml   Left 1stDorInt Ulnar C8-T1 Nml Nml Nml Nml Nml 0 Nml Nml   Left PronatorTeres Median C6-7 Nml Nml Nml Nml Nml 0 Nml Nml   Left Biceps Musculocut C5-6 Nml Nml Nml Nml Nml 0 Nml Nml   Left FlexDigProf Ulnar C8,T1 Nml Nml Nml Nml Nml 0 Nml Nml     Nerve Conduction Studies Anti Sensory Left/Right Comparison   Stim Site L Lat (ms) R Lat (ms) L-R Lat (ms) L Amp (V) R Amp (V) L-R Amp (%) Site1 Site2 L Vel (m/s) R Vel (m/s) L-R Vel (m/s)  Median Acr Palm Anti Sensory (2nd Digit)  32.8C  Wrist 3.4   21.9   Wrist Palm     Palm 1.8   23.7         Radial Anti Sensory (Base 1st Digit)  31.3C  Wrist 2.3   14.2   Wrist Base 1st Digit     Ulnar Anti Sensory (5th Digit)  32.7C  Wrist 3.6   23.3   Wrist 5th Digit 39     Motor Left/Right Comparison   Stim Site L Lat (ms) R Lat (ms) L-R Lat (ms) L Amp (mV) R Amp (mV) L-R Amp (%) Site1 Site2 L Vel (m/s) R Vel (m/s) L-R Vel (m/s)  Median Motor (Abd Poll Brev)  31.7C  Wrist 4.1   5.4   Elbow Wrist 50    Elbow 8.7   5.9         Ulnar Motor (Abd Dig Min)  32.1C  Wrist 2.9   7.7   B Elbow Wrist 53    B Elbow 6.9   7.3   A Elbow B Elbow *42    A Elbow 9.3   6.6            Waveforms:             Clinical History: No specialty comments available.     Objective:  VS:  HT:    WT:   BMI:     BP:   HR: bpm  TEMP: ( )  RESP:  Physical Exam Musculoskeletal:        General: No tenderness.     Comments: Inspection reveals no atrophy of the bilateral APB or FDI or hand intrinsics. There is no swelling, color changes, allodynia or dystrophic changes. There is 5 out of 5 strength in the bilateral wrist extension, finger abduction and  long finger flexion. There is intact sensation to light touch in all dermatomal and peripheral nerve distributions. There is a negative Froment's  test bilaterally.  Equivocal Wartenberg's sign but negative Benedictine sign or clawing.  There is a positive Tinel's test at the left elbow.  There is a negative Hoffmann's test bilaterally.  Skin:    General: Skin is warm and dry.     Findings: No erythema or rash.  Neurological:     General: No focal deficit present.     Mental Status: He is alert and oriented to person, place, and time.     Sensory: No sensory deficit.     Motor: No weakness or abnormal muscle tone.     Coordination: Coordination normal.     Gait: Gait normal.  Psychiatric:        Mood and Affect: Mood normal.        Behavior: Behavior normal.        Thought Content: Thought content normal.      Imaging: No results found.

## 2020-07-10 NOTE — Procedures (Signed)
EMG & NCV Findings: Evaluation of the left ulnar motor nerve showed decreased conduction velocity (A Elbow-B Elbow, 42 m/s).  All remaining nerves (as indicated in the following tables) were within normal limits.    All examined muscles (as indicated in the following table) showed no evidence of electrical instability.    Impression: The above electrodiagnostic study is ABNORMAL and reveals evidence of a moderate left ulnar nerve neuropathy at the elbow (cubital tunnel syndrome) affecting motor components. There is no significant electrodiagnostic evidence of any other focal nerve entrapment, brachial plexopathy or cervical radiculopathy.   Recommendations: 1.  Follow-up with referring physician. 2.  Continue current management of symptoms. 3.  Suggest surgical evaluation.  ___________________________ Laurence Spates FAAPMR Board Certified, American Board of Physical Medicine and Rehabilitation    Nerve Conduction Studies Anti Sensory Summary Table   Stim Site NR Peak (ms) Norm Peak (ms) P-T Amp (V) Norm P-T Amp Site1 Site2 Delta-P (ms) Dist (cm) Vel (m/s) Norm Vel (m/s)  Left Median Acr Palm Anti Sensory (2nd Digit)  32.8C  Wrist    3.4 <3.6 21.9 >10 Wrist Palm 1.6 0.0    Palm    1.8 <2.0 23.7         Left Radial Anti Sensory (Base 1st Digit)  31.3C  Wrist    2.3 <3.1 14.2  Wrist Base 1st Digit 2.3 0.0    Left Ulnar Anti Sensory (5th Digit)  32.7C  Wrist    3.6 <3.7 23.3 >15.0 Wrist 5th Digit 3.6 14.0 39 >38   Motor Summary Table   Stim Site NR Onset (ms) Norm Onset (ms) O-P Amp (mV) Norm O-P Amp Site1 Site2 Delta-0 (ms) Dist (cm) Vel (m/s) Norm Vel (m/s)  Left Median Motor (Abd Poll Brev)  31.7C  Wrist    4.1 <4.2 5.4 >5 Elbow Wrist 4.6 23.0 50 >50  Elbow    8.7  5.9         Left Ulnar Motor (Abd Dig Min)  32.1C  Wrist    2.9 <4.2 7.7 >3 B Elbow Wrist 4.0 21.0 53 >53  B Elbow    6.9  7.3  A Elbow B Elbow 2.4 10.0 *42 >53  A Elbow    9.3  6.6          EMG   Side Muscle  Nerve Root Ins Act Fibs Psw Amp Dur Poly Recrt Int Fraser Din Comment  Left Abd Poll Brev Median C8-T1 Nml Nml Nml Nml Nml 0 Nml Nml   Left 1stDorInt Ulnar C8-T1 Nml Nml Nml Nml Nml 0 Nml Nml   Left PronatorTeres Median C6-7 Nml Nml Nml Nml Nml 0 Nml Nml   Left Biceps Musculocut C5-6 Nml Nml Nml Nml Nml 0 Nml Nml   Left FlexDigProf Ulnar C8,T1 Nml Nml Nml Nml Nml 0 Nml Nml     Nerve Conduction Studies Anti Sensory Left/Right Comparison   Stim Site L Lat (ms) R Lat (ms) L-R Lat (ms) L Amp (V) R Amp (V) L-R Amp (%) Site1 Site2 L Vel (m/s) R Vel (m/s) L-R Vel (m/s)  Median Acr Palm Anti Sensory (2nd Digit)  32.8C  Wrist 3.4   21.9   Wrist Palm     Palm 1.8   23.7         Radial Anti Sensory (Base 1st Digit)  31.3C  Wrist 2.3   14.2   Wrist Base 1st Digit     Ulnar Anti Sensory (5th Digit)  32.7C  Wrist 3.6  23.3   Wrist 5th Digit 39     Motor Left/Right Comparison   Stim Site L Lat (ms) R Lat (ms) L-R Lat (ms) L Amp (mV) R Amp (mV) L-R Amp (%) Site1 Site2 L Vel (m/s) R Vel (m/s) L-R Vel (m/s)  Median Motor (Abd Poll Brev)  31.7C  Wrist 4.1   5.4   Elbow Wrist 50    Elbow 8.7   5.9         Ulnar Motor (Abd Dig Min)  32.1C  Wrist 2.9   7.7   B Elbow Wrist 53    B Elbow 6.9   7.3   A Elbow B Elbow *42    A Elbow 9.3   6.6            Waveforms:

## 2020-07-11 ENCOUNTER — Ambulatory Visit (INDEPENDENT_AMBULATORY_CARE_PROVIDER_SITE_OTHER): Payer: Medicare Other | Admitting: Rehabilitative and Restorative Service Providers"

## 2020-07-11 ENCOUNTER — Encounter: Payer: Self-pay | Admitting: Rehabilitative and Restorative Service Providers"

## 2020-07-11 ENCOUNTER — Encounter: Payer: Self-pay | Admitting: Orthopaedic Surgery

## 2020-07-11 DIAGNOSIS — M6281 Muscle weakness (generalized): Secondary | ICD-10-CM | POA: Diagnosis not present

## 2020-07-11 DIAGNOSIS — M79602 Pain in left arm: Secondary | ICD-10-CM

## 2020-07-11 NOTE — Therapy (Signed)
[facility] [address] [place], [place], [address] Phone: [phone]   Fax:  [phone]  Physical Therapy Treatment/Discharge  Patient Details  Name: Grant [name] MRN: [id number] Date of Birth: 1955-08-06 Referring Provider (PT): Dr. Erlinda Hong   Encounter Date: 07/11/2020   PHYSICAL THERAPY DISCHARGE SUMMARY  Visits from Start of Care: 4  Current functional level related to goals / functional outcomes: See note   Remaining deficits: See note   Education / Equipment: HEP Plan: Patient agrees to discharge.  Patient goals were met. Patient is being discharged due to being pleased with the current functional level.  ?????        PT End of Session - 07/11/20 0903    Visit Number 5    Number of Visits 20    Date for PT Re-Evaluation 08/10/20    Authorization Type Medicare, kx at 15    Progress Note Due on Visit 10    PT Start Time 0837    PT Stop Time 0903    PT Time Calculation (min) 26 min    Activity Tolerance Patient tolerated treatment well    Behavior During Therapy [facility] for tasks assessed/performed           Past Medical History:  Diagnosis Date  . Acid reflux   . Allergy   . Anemia   . Anxiety   . History of hiatal hernia   . Hypertension   . Insomnia   . RLS (restless legs syndrome)   . Sensorineural hearing loss   . Von Willebrand disease ([place])    PT STATES THIS IS MILD AND HAS NEVER HAD TO SEE HEMATOLOGIST-PT DID SAY THAT WHEN HE [place] AGO HE BLED ALOT BUT IT WAS DUE TO NOT FOLLOWING POST OP INSTRUCTIONS     Past Surgical History:  Procedure Laterality Date  . CIRCUMCISION     AS AN ADULT  . COLONOSCOPY    . FOOT SURGERY Right    arch support  . HERNIA REPAIR    . INCISION AND DRAINAGE Right 02/04/2013   Procedure: INCISION AND DRAINAGE;  Surgeon: Linna Hoff, MD;  Location: [place];  Service: Orthopedics;  Laterality: Right;  . INGUINAL HERNIA REPAIR Bilateral 05/13/2018    Procedure: LAPAROSCOPIC BILATERAL INGUINAL HERNIA REPAIR;  Surgeon: Olean Ree, MD;  Location: [facility];  Service: General;  Laterality: Bilateral;  . INGUINAL HERNIA REPAIR Right 06/17/2019   Procedure: HERNIA REPAIR INGUINAL ADULT WITH MESH-- Recurrent;  Surgeon: Olean Ree, MD;  Location: [facility];  Service: General;  Laterality: Right;  . OPEN REDUCTION INTERNAL FIXATION (ORIF) FINGER WITH RADIAL BONE GRAFT Right 02/04/2013   Procedure: OPEN REDUCTION INTERNAL FIXATION (ORIF) right ring and small fingers;  Surgeon: Linna Hoff, MD;  Location: [place];  Service: Orthopedics;  Laterality: Right;    There were no vitals filed for this visit.   Subjective Assessment - 07/11/20 0846    Subjective Pt. saw MD c nerve conduction testing showing moderate restriction in ulnar nerve at cubital region.  Pt. stated he is still doing better overall, sometimes irritated in elbow with increased work activity.    Limitations Lifting;House hold activities;Other (comment)   work   Patient Stated Goals Reduce pain, be able to work    Currently in Pain? No/denies    Pain Onset More than a month ago              [facility] PT Assessment - 07/11/20 0001  Assessment   Medical Diagnosis Cubital tunnel syndrome, lateral epicondylitis Lt    Referring Provider (PT) Dr. Erlinda Hong    Onset Date/Surgical Date 04/11/20    Hand Dominance Right      Observation/Other Assessments   Focus on Therapeutic Outcomes (FOTO)  update 86% (from last visit)      AROM   Overall AROM Comments AROM WFL no complaints      Strength   Right Forearm Pronation 5/5    Right Forearm Supination 5/5    Left Forearm Pronation 5/5    Left Forearm Supination 5/5    Right Wrist Flexion 5/5    Right Wrist Extension 5/5    Left Wrist Flexion 5/5    Left Wrist Extension 5/5    Left Wrist Radial Deviation 5/5    Left Hand Grip (lbs) 83.3, 82.2 lbs                         [facility] Adult PT Treatment/Exercise - 07/11/20  0001      Exercises   Other Exercises  Review of HEP for d/c to home program.  Additional time spent in review c handout, verbal cues given      Elbow Exercises   Other elbow exercises UBE fwd/back 3 mins each way Lvl 3.5, ulnar nerve flossing into palm on side of face c thumb on forehead x 10 Lt    Other elbow exercises prone scapular retraction 5 sec hold x 10, prone scapular retraction c GH ext off table 5 sec hold x 10, prone horizontal abduction middle trap hold 5 sec hold x 10                  PT Education - 07/11/20 0907    Education Details HEP D/C.    Person(s) Educated Patient    Methods Explanation;Demonstration;Verbal cues;Handout    Comprehension Verbalized understanding;Returned demonstration            PT Short Term Goals - 06/26/20 1303      PT SHORT TERM GOAL #1   Title Patient will demonstrate independent use of home exercise program to maintain progress from in clinic treatments.    Time 3    Period Weeks    Status Achieved    Target Date 06/22/20             PT Long Term Goals - 07/11/20 0906      PT LONG TERM GOAL #1   Title Patient will demonstrate/report pain at worst less than or equal to 2/10 to facilitate minimal limitation in daily activity secondary to pain symptoms.    Time 10    Period Weeks    Status Achieved      PT LONG TERM GOAL #2   Title Patient will demonstrate independent use of home exercise program to facilitate ability to maintain/progress functional gains from skilled physical therapy services.    Time 10    Period Weeks    Status Achieved      PT LONG TERM GOAL #3   Title Patient will demonstrate return to work/recreational activity at previous level of function without limitations secondary due to condition    Time 10    Period Weeks    Status Achieved      PT LONG TERM GOAL #4   Title Pt. will demonstrate FOTO outcome > = 60 to indicated reduced disability due to condition.    Time 10    Period  Weeks     Status Achieved      PT LONG TERM GOAL #5   Title Pt. will demonstrate Lt wrist AROM, MMT 5/5 throughout s symptoms to facilitate usual daily and work activity at [employer].    Time 10    Period Weeks    Status Achieved                 Plan - 07/11/20 0905    Clinical Impression Statement Pt. has attended 5 visits overall during course of treatment, making good progress as previously documented.  See objective data for updated information showing gains towards established goals.  Pt. is recommended for d/c to HEP at this time due to pain complaint improvements c HEP to address residual limits related to nerve compression (as documented in NCV testing).  Pt. was in agreement with plan.    Personal Factors and Comorbidities Comorbidity 2    Comorbidities HTN, anxiety    Examination-Activity Limitations Sleep;Bend;Carry;Lift;Reach Overhead    Examination-Participation Restrictions Occupation;Community Activity;Yard Work    Stability/Clinical Decision Making Stable/Uncomplicated    Rehab Potential Good    PT Frequency --   2x   PT Duration Other (comment)   10 weeks   PT Treatment/Interventions ADLs/Self Care Home Management;Cryotherapy;Electrical Stimulation;Iontophoresis 37m/ml Dexamethasone;Moist Heat;Balance training;Therapeutic exercise;Therapeutic activities;Functional mobility training;DME Instruction;Ultrasound;Neuromuscular re-education;Patient/family education;Manual techniques;Passive range of motion;Spinal Manipulations;Joint Manipulations;Dry needling;Taping    PT Next Visit Plan D/C to HEP    PT Home Exercise Plan [id number]   Consulted and Agree with Plan of Care Patient           Patient will benefit from skilled therapeutic intervention in order to improve the following deficits and impairments:  Impaired UE functional use,Pain,Decreased strength,Decreased activity tolerance,Increased fascial restricitons,Impaired flexibility,Impaired perceived functional ability,Decreased  mobility  Visit Diagnosis: Pain in left arm  Muscle weakness (generalized)     Problem List Patient Active Problem List   Diagnosis Date Noted  . Aortic atherosclerosis ([place] 06/27/2020  . Centrilobular emphysema ([place] 06/27/2020  . Lateral epicondylitis, left elbow 05/31/2020  . Recurrent inguinal hernia of right side without obstruction or gangrene   . Bilateral inguinal hernia 05/08/2018  . Cubital tunnel syndrome on left 04/09/2018  . Sensorineural hearing loss   . Restless leg syndrome 11/27/2017  . Flat feet, bilateral 04/03/2017  . Insomnia    MScot Jun PT, DPT, OCS, ATC 07/11/20  9:08 AM    [facility] [address] [address]Phone: [phone]  Fax:  [phone] Name: DAARRON WIERZBICKIMRN: [id number] of Birth: [birth year]

## 2020-07-11 NOTE — Patient Instructions (Signed)
Access Code: [email or web address] URL: [email or web address] Date: 07/11/2020 Prepared by: Scot Jun  Exercises Standing Wrist Flexion Stretch - 2-3 x daily - 7 x weekly - 1 sets - 5 reps - 15 hold Eccentric Wrist Extension with Resistance - 2 x daily - 7 x weekly - 3 sets - 10 reps Forearm Pronation and Supination with Hammer - 2 x daily - 7 x weekly - 3 sets - 10 reps Ulnar Nerve Flossing - 2 x daily - 7 x weekly - 10 reps - 1 sets - 3-5 sec hold Standing Elbow Extension with Anchored Resistance at Wall - 2 x daily - 7 x weekly - 3 sets - 10 reps Standing Ulnar Nerve Glide - 2 x daily - 7 x weekly - 2-3 sets - 10 reps Prone Scapular Retraction - 1 x daily - 7 x weekly - 1 sets - 10 reps - 5 hold Prone Scapular Retraction Arms at Side - 1 x daily - 7 x weekly - 1 sets - 10 reps - 5 hold Prone Scapular Slide with Shoulder Extension - 1 x daily - 7 x weekly - 1 sets - 10 reps - 5 hold

## 2020-07-14 ENCOUNTER — Other Ambulatory Visit: Payer: Self-pay

## 2020-07-14 ENCOUNTER — Encounter: Payer: Self-pay | Admitting: Urology

## 2020-07-14 ENCOUNTER — Ambulatory Visit (INDEPENDENT_AMBULATORY_CARE_PROVIDER_SITE_OTHER): Payer: Medicare Other | Admitting: Urology

## 2020-07-14 VITALS — BP 132/57 | HR 54 | Temp 98.3°F | Ht 66.0 in | Wt 170.0 lb

## 2020-07-14 DIAGNOSIS — N50819 Testicular pain, unspecified: Secondary | ICD-10-CM | POA: Diagnosis not present

## 2020-07-14 MED ORDER — MELOXICAM 15 MG PO TBDP: 1.0000 | ORAL_TABLET | Freq: Every day | ORAL | 11 refills | Status: DC

## 2020-07-14 NOTE — Progress Notes (Signed)
07/14/2020 9:32 AM   Grant [name] March 28, 1955 [id number]  Referring provider: Susy Frizzle, MD [address] [place],  [place] [address]  Right testis pain  HPI: Grant [name] is a 18JY here for followup for right testis pain. He was given meloxicam 7.5mg  daily. He notes significant improvement in his testis pain and only has 0-2 event per day. He is happy with his improvement. No exacerbating/alleviaitng events.    PMH: Past Medical History:  Diagnosis Date  . Acid reflux   . Allergy   . Anemia   . Anxiety   . History of hiatal hernia   . Hypertension   . Insomnia   . RLS (restless legs syndrome)   . Sensorineural hearing loss   . Von Willebrand disease ([place])    PT STATES THIS IS MILD AND HAS NEVER HAD TO SEE HEMATOLOGIST-PT DID SAY THAT WHEN [place] HE BLED ALOT BUT IT WAS DUE TO NOT FOLLOWING POST OP INSTRUCTIONS     Surgical History: Past Surgical History:  Procedure Laterality Date  . CIRCUMCISION     AS AN ADULT  . COLONOSCOPY    . FOOT SURGERY Right    arch support  . HERNIA REPAIR    . INCISION AND DRAINAGE Right 02/04/2013   Procedure: INCISION AND DRAINAGE;  Surgeon: Linna Hoff, MD;  Location: [place];  Service: Orthopedics;  Laterality: Right;  . INGUINAL HERNIA REPAIR Bilateral 05/13/2018   Procedure: LAPAROSCOPIC BILATERAL INGUINAL HERNIA REPAIR;  Surgeon: Olean Ree, MD;  Location: [facility];  Service: General;  Laterality: Bilateral;  . INGUINAL HERNIA REPAIR Right 06/17/2019   Procedure: HERNIA REPAIR INGUINAL ADULT WITH MESH-- Recurrent;  Surgeon: Olean Ree, MD;  Location: [facility];  Service: General;  Laterality: Right;  . OPEN REDUCTION INTERNAL FIXATION (ORIF) FINGER WITH RADIAL BONE GRAFT Right 02/04/2013   Procedure: OPEN REDUCTION INTERNAL FIXATION (ORIF) right ring and small fingers;  Surgeon: Linna Hoff, MD;  Location: [place];  Service: Orthopedics;  Laterality: Right;    Home Medications:   Allergies as of 07/14/2020      Reactions   Bee Venom Anaphylaxis, Swelling, Rash   Asa [aspirin] Other (See Comments)   Gi upset      Medication List       Accurate as of Jul 14, 2020  9:32 AM. If you have any questions, ask your nurse or doctor.        STOP taking these medications   lidocaine 5 % ointment Commonly known as: XYLOCAINE Stopped by: Nicolette Bang, MD     TAKE these medications   amLODipine 10 MG tablet Commonly known as: NORVASC TAKE 1 TABLET DAILY   atorvastatin 10 MG tablet Commonly known as: LIPITOR Take 1 tablet (10 mg total) by mouth daily. What changed: Another medication with the same name was removed. Continue taking this medication, and follow the directions you see here. Changed by: Nicolette Bang, MD   clobetasol 0.05 % external solution Commonly known as: TEMOVATE SMARTSIG:Sparingly Topical Daily   diclofenac Sodium 1 % Gel Commonly known as: Voltaren Apply 2 g topically 4 (four) times daily.   doxycycline 100 MG capsule Commonly known as: VIBRAMYCIN Take 100 mg by mouth 2 (two) times daily.   EPINEPHrine 0.3 mg/0.3 mL Soaj injection Commonly known as: EPI-PEN INJECT 0.3 ML (0.3 MG) INTO THE MUSCLE ONCE FOR 1 DOSE   Fluocinolone Acetonide Scalp 0.01 % Oil Commonly known  as: Derma-Smoothe/FS Scalp Apply 1 application topically daily.   fluticasone 50 MCG/ACT nasal spray Commonly known as: FLONASE Place 2 sprays into both nostrils daily.   ketoconazole 2 % shampoo Commonly known as: NIZORAL SMARTSIG:Sparingly Topical Daily   losartan 50 MG tablet Commonly known as: COZAAR TAKE 1 TABLET DAILY   meloxicam 7.5 MG tablet Commonly known as: Mobic Take 1 tablet (7.5 mg total) by mouth daily.   Neupro 1 MG/24HR Pt24 Generic drug: Rotigotine Place 1 patch (1 mg total) onto the skin daily at 4 PM.   pantoprazole 40 MG tablet Commonly known as: PROTONIX TAKE 1 TABLET DAILY   rOPINIRole 0.25 MG tablet Commonly known as:  Requip Take 1 tablet (0.25 mg total) by mouth in the morning and at bedtime.   triamcinolone cream 0.1 % Commonly known as: KENALOG Apply 1 application topically 2 (two) times daily.       Allergies:  Allergies  Allergen Reactions  . Bee Venom Anaphylaxis, Swelling and Rash  . Asa [Aspirin] Other (See Comments)    Gi upset    Family History: Family History  Problem Relation Age of Onset  . Hyperlipidemia Mother   . Hyperlipidemia Father   . Colon cancer Neg Hx   . Esophageal cancer Neg Hx   . Rectal cancer Neg Hx   . Stomach cancer Neg Hx     Social History:  reports that he quit smoking about 4 years ago. His smoking use included cigarettes. He has a 20.00 pack-year smoking history. He has never used smokeless tobacco. He reports current drug use. Drug: Marijuana. He reports that he does not drink alcohol.  ROS: All other review of systems were reviewed and are negative except what is noted above in HPI  Physical Exam: BP (!) 132/57   Pulse (!) 54   Temp 98.3 F (36.8 C)   Ht 5\' 6"  (1.676 m)   Wt 170 lb (77.1 kg)   BMI 27.44 kg/m   Constitutional:  Alert and oriented, No acute distress. HEENT: [place] AT, moist mucus membranes.  Trachea midline, no masses. Cardiovascular: No clubbing, cyanosis, or edema. Respiratory: Normal respiratory effort, no increased work of breathing. GI: Abdomen is soft, nontender, nondistended, no abdominal masses GU: No CVA tenderness.  Lymph: No cervical or inguinal lymphadenopathy. Skin: No rashes, bruises or suspicious lesions. Neurologic: Grossly intact, no focal deficits, moving all 4 extremities. Psychiatric: Normal mood and affect.  Laboratory Data: Lab Results  Component Value Date   WBC 7.2 06/16/2020   HGB 14.3 06/16/2020   HCT 42.8 06/16/2020   MCV 90.7 06/16/2020   PLT 266 06/16/2020    Lab Results  Component Value Date   CREATININE 0.96 10/25/2019    Lab Results  Component Value Date   PSA 1.1 10/25/2019   PSA  1.8 05/01/2018   PSA 1.4 09/15/2017    No results found for: TESTOSTERONE  Lab Results  Component Value Date   HGBA1C 5.4 10/25/2019    Urinalysis    Component Value Date/Time   APPEARANCEUR Clear 06/09/2020 1047   GLUCOSEU Negative 06/09/2020 1047   BILIRUBINUR Negative 06/09/2020 1047   PROTEINUR Negative 06/09/2020 1047   NITRITE Negative 06/09/2020 1047   LEUKOCYTESUR Negative 06/09/2020 1047    No results found for: LABMICR, [place], RBCUA, LABEPIT, MUCUS, BACTERIA  Pertinent Imaging:  No results found for this or any previous visit.  Results for orders placed during the hospital encounter of 12/07/08  US VenoUS Imaging Bilateral  Narrative Clinical  Data: Bilateral foot pain, numbness, tingling  BILATERAL LOWER EXTREMITY VENOUS DOPPLER ULTRASOUND  Technique: Gray-scale sonography with compression, as well as color and duplex ultrasound, were performed to evaluate the deep venous system from the level of the common femoral vein through the popliteal and proximal calf veins.  Comparison:   none  Findings:  Normal compressibility of  the common femoral, superficial femoral, and popliteal veins, as well as the proximal calf veins.  No filling defects to suggest DVT on grayscale or color Doppler imaging.  Doppler waveforms show normal direction of venous flow, normal respiratory phasicity and response to augmentation.  IMPRESSION: No evidence of  lower extremity deep vein thrombosis.  Provider: Ulyess Mort  No results found for this or any previous visit.  No results found for this or any previous visit.  No results found for this or any previous visit.  No results found for this or any previous visit.  No results found for this or any previous visit.  No results found for this or any previous visit.   Assessment & Plan:    1. Pain in testicle, unspecified laterality -increase meloxicam 15mg  daily -RTC 3 monthds - Urinalysis, Routine w reflex  microscopic   No follow-ups on file.  Nicolette Bang, MD  [facility] [place]

## 2020-07-14 NOTE — Progress Notes (Signed)
Urological Symptom Review  Patient is experiencing the following symptoms: Hard to postpone urination Get up at night to urinate   Review of Systems  Gastrointestinal (upper)  : Indigestion/heartburn  Gastrointestinal (lower) : Negative for lower GI symptoms  Constitutional : Fatigue  Skin: Skin rash/lesion Itching  Eyes: Negative for eye symptoms  Ear/Nose/Throat : Sinus problems  Hematologic/Lymphatic: Swollen glands  Cardiovascular : Leg swelling  Respiratory : Shortness of breath  Endocrine: Negative for endocrine symptoms  Musculoskeletal: Joint pain  Neurological: Negative for neurological symptoms  Psychologic: Negative for psychiatric symptoms

## 2020-07-14 NOTE — Patient Instructions (Signed)
Scrotal Swelling Scrotal swelling is a condition in which the sac of skin that contains the testicles, blood vessels, and structures that help deliver sperm and semen (scrotum) is enlarged or swollen. This can happen on one or both sides of the scrotum. Many things can cause the scrotum to enlarge or swell, including:  Fluid around the testicle (hydrocele).  A weakened area in the muscles around the groin (hernia).  An enlarged vein around the testicle.  An injury.  An infection.  Certain medical treatments.  Certain medical conditions, such as congestive heart failure.  A recent genital surgery or procedure.  A twisting of the spermatic cord that cuts off blood supply (testicular torsion).  Testicular cancer. Scrotal swelling can happen along with scrotal pain. Follow these instructions at home: Activity  Rest as told by your health care provider. The best position is to lie down.  Do not lift anything that is heavier than 5 lb (2.3 kg), or the limit that you are told, until your health care provider says that it is safe.  Avoid sexual activity until your health care provider says that it is safe. General instructions  Take over-the-counter and prescription medicines only as told by your health care provider.  Perform a monthly self-exam of the scrotum and penis. Feel for changes. Ask your health care provider how to perform a monthly self-exam if you are unsure.  Keep all follow-up visits. This is important. Managing pain, stiffness, and swelling  If directed, put ice on the affected area. To do this: ? Put ice in a plastic bag. ? Place a towel between your skin and the bag. ? Leave the ice on for 20 minutes, 2-3 times a day. ? Remove the ice if your skin turns bright red. This is very important. If you cannot feel pain, heat, or cold, you have a greater risk of damage to the area.  Place a rolled towel under your testicles for support or use underwear with a  supportive pouch.  Wear an athletic support cup or scrotal support, such as a jock strap, for comfort. Contact a health care provider if:  You have sudden pain that is persistent and does not improve.  You have a heavy feeling or notice fluid in the scrotum.  You have pain or burning while urinating.  You have blood in your urine or semen.  You feel a lump around the testicle.  You notice that one testicle is larger than the other. Keep in mind that a small difference in size is normal.  You have a persistent dull ache or pain in your groin or scrotum. Get help right away if:  The pain does not go away.  The pain becomes severe.  You have a fever or chills.  You have pain or vomiting that cannot be controlled.  One or both sides of the scrotum are very red and swollen.  There is redness spreading upward from your scrotum to your abdomen or downward from your scrotum to your thighs. Summary  Scrotal swelling is a condition in which the sac of skin that contains the testicles, blood vessels, and structures that help deliver the sperm and semen (scrotum) is enlarged or swollen.  Many things can cause the scrotum to swell, including fluid around the testicle (hydrocele), a weakened area in the muscles around the groin (hernia), and an enlarged vein around the testicle.  Icing the scrotum or using underwear with a supportive pouch may help reduce swelling and pain.    Contact a health care provider if you develop scrotal pain that is sudden and persistent, you have pain while urinating, you feel a lump around the testicle, or you notice blood in your urine or semen.  Get help right away if you have uncontrolled pain or vomiting, a very red and swollen scrotum, or a fever or chills. This information is not intended to replace advice given to you by your health care provider. Make sure you discuss any questions you have with your health care provider. Document Revised: 10/26/2019  Document Reviewed: 10/26/2019 Elsevier Patient Education  2021 [employer]vier Inc[employer]  

## 2020-07-15 ENCOUNTER — Encounter: Payer: Self-pay | Admitting: Urology

## 2020-07-15 ENCOUNTER — Encounter: Payer: Self-pay | Admitting: Nurse Practitioner

## 2020-07-15 LAB — URINALYSIS, ROUTINE W REFLEX MICROSCOPIC
Bilirubin, UA: NEGATIVE
Glucose, UA: NEGATIVE
Ketones, UA: NEGATIVE
Leukocytes,UA: NEGATIVE
Nitrite, UA: NEGATIVE
Protein,UA: NEGATIVE
Specific Gravity, UA: 1.01 (ref 1.005–1.030)
Urobilinogen, Ur: 0.2 mg/dL (ref 0.2–1.0)
pH, UA: 7 (ref 5.0–7.5)

## 2020-07-15 LAB — MICROSCOPIC EXAMINATION
Bacteria, UA: NONE SEEN
WBC, UA: NONE SEEN /hpf (ref 0–5)

## 2020-07-17 ENCOUNTER — Telehealth: Payer: Self-pay

## 2020-07-18 NOTE — Telephone Encounter (Signed)
Looks like urinalysis was done at Urology - would defer to their recommendations

## 2020-07-19 NOTE — Telephone Encounter (Signed)
Please have patient make f/u to discuss symptoms and to discuss alternative options for high cholesterol

## 2020-07-20 ENCOUNTER — Encounter: Payer: Self-pay | Admitting: Nurse Practitioner

## 2020-07-20 ENCOUNTER — Other Ambulatory Visit: Payer: Self-pay

## 2020-07-20 ENCOUNTER — Ambulatory Visit (INDEPENDENT_AMBULATORY_CARE_PROVIDER_SITE_OTHER): Payer: Medicare Other | Admitting: Nurse Practitioner

## 2020-07-20 VITALS — BP 130/62 | HR 54 | Temp 97.9°F | Ht 66.0 in | Wt 176.4 lb

## 2020-07-20 DIAGNOSIS — Z0001 Encounter for general adult medical examination with abnormal findings: Secondary | ICD-10-CM | POA: Diagnosis not present

## 2020-07-20 DIAGNOSIS — I700 Atherosclerosis of aorta: Secondary | ICD-10-CM

## 2020-07-20 DIAGNOSIS — Z0000 Encounter for general adult medical examination without abnormal findings: Secondary | ICD-10-CM

## 2020-07-20 DIAGNOSIS — J432 Centrilobular emphysema: Secondary | ICD-10-CM

## 2020-07-20 MED ORDER — EZETIMIBE 10 MG PO TABS: 10.0000 mg | ORAL_TABLET | Freq: Every day | ORAL | 0 refills | Status: DC

## 2020-07-20 NOTE — Patient Instructions (Signed)
Ezetimibe Tablets What is this medicine? EZETIMIBE (ez ET i mibe) treats high cholesterol. Ezetimibe blocks the absorption of cholesterol from the stomach. It is used with lifestyle changes, like diet and exercise. It may be used alone or with other medicines. This medicine may be used for other purposes; ask your health care provider or pharmacist if you have questions. COMMON BRAND NAME(S): Zetia What should I tell my health care provider before I take this medicine? They need to know if you have any of these conditions:  kidney disease  liver disease  muscle cramps, pain  muscle injury  thyroid disease  an unusual or allergic reaction to ezetimibe, other medicines, foods, dyes, or preservatives  pregnant or trying to get pregnant  breast-feeding How should I use this medicine? Take this medicine by mouth. Take it as directed on the prescription label at the same time every day. You can take it with or without food. If it upsets your stomach, take it with food. Keep taking it unless your health care provider tells you to stop. Take bile acid sequestrants at a different time of day than this medicine. Take this medicine 2 hours BEFORE or 4 hours AFTER bile acid sequestrants. Talk to your health care provider about the use of this medicine in children. While it may be prescribed for children as young as 10 for selected conditions, precautions do apply. Overdosage: If you think you have taken too much of this medicine contact a poison control center or emergency room at once. NOTE: This medicine is only for you. Do not share this medicine with others. What if I miss a dose? If you miss a dose, take it as soon as you can. If it is almost time for your next dose, take only that dose. Do not take double or extra doses. What may interact with this medicine? Do not take this medicine with any of the following medications:  fenofibrate  gemfibrozil This medicine may also interact with  the following medications:  antacids  cyclosporine  herbal medicines like red yeast rice  other medicines to lower cholesterol or triglycerides This list may not describe all possible interactions. Give your health care provider a list of all the medicines, herbs, non-prescription drugs, or dietary supplements you use. Also tell them if you smoke, drink alcohol, or use illegal drugs. Some items may interact with your medicine. What should I watch for while using this medicine? Visit your health care provider for regular checks on your progress. Tell your health care provider if your symptoms do not start to get better or if they get worse. Your health care provider may tell you to stop taking this medicine if you develop muscle problems. If your muscle problems do not go away after stopping this medicine, contact your health care provider. Do not become pregnant while taking this medicine. Women should inform their health care provider if they wish to become pregnant or think they might be pregnant. There is potential for serious harm to an unborn child. Talk to your health care provider for more information. Do not breast-feed an infant while taking this medicine. Taking this medicine is only part of a total heart healthy program. Your health care provider may give you a special diet to follow. Avoid alcohol. Avoid smoking. Ask your health care provider how much you should exercise. What side effects may I notice from receiving this medicine? Side effects that you should report to your doctor or health care provider as soon  as possible:  allergic reactions (skin rash, itching or hives; swelling of the face, lips, or tongue)  liver injury (dark yellow or brown urine; general ill feeling or flu-like symptoms; loss of appetite, right upper belly pain; unusually weak or tired, yellowing of the eyes or skin)  muscle injury (dark urine; trouble passing urine or change in the amount of urine;  unusually weak or tired; muscle pain; back pain) Side effects that usually do not require medical attention (report to your doctor or health care provider if they continue or are bothersome):  depressed mood  diarrhea  headache  infection (fever, chills, cough, sore throat, pain, or trouble passing urine)  joint pain  stomach pain This list may not describe all possible side effects. Call your doctor for medical advice about side effects. You may report side effects to FDA at 1-800-FDA-1088. Where should I keep my medicine? Keep out of the reach of children and pets. Store at room temperature between 15 and 30 degrees C (59 and 86 degrees F). Protect from moisture. Get rid of any unused medicine after the expiration date. NOTE: This sheet is a summary. It may not cover all possible information. If you have questions about this medicine, talk to your doctor, pharmacist, or health care provider.  2021 Elsevier/Gold Standard (2019-02-03 17:28:51)

## 2020-07-20 NOTE — Progress Notes (Signed)
Patient: Grant [name], Male    DOB: 1955-12-25, 65 y.o.   MRN: [id number]  Visit Date: 07/20/2020  Today's Provider: Valentino Nose, NP   Chief Complaint  Patient presents with  . Medicare Wellness  . Hyperlipidemia    Stopped taking the lipitor due to side effects    Subjective:   Grant [name] is a 65 y.o. male who presents today for his Subsequent Annual Wellness Visit.  Care Team: Primary care - [facility] - [facility] - [facility] - [facility]  HPI  Aortic atherosclerosis - started low dose atorvastatin 10 mg after last visit - reports side effects - joint pain, nausea, cough; took for 2 weeks.  Willing to try zetia.    Centrilobular emphysema - quit smoking in 2018, not exposed to second hand smoke currently.  No current symptoms.  Hard to wear a mask.  Physical ability is not limited currently secondary to breathing.  GERD - takes pantoprazole 40 mg daily; knows trigger foods and watches soda, spicy food, dark chocolate intake.  Can tell "a little bit" if he misses a dose - stomach starts hurting.   Testicle and elbow pain - Mobic is working well; Follows up with Urology in 1 month for testicle pain.  Reports they discussed other options including "cutting a nerve" and he does not want to pursue that route currently.  Hypertension - Used to check BP at home; does not as much anymore.  Up and down; usually around 130/60s   Dermatology - sores on scalp are not ingrown hair, reportedly "bacterial infection in follicle."  Currently taking an antibiotic and shampoo. Follows up in 1 month.  Review of Systems  Constitutional: Negative.   HENT: Negative.   Eyes: Negative.   Respiratory: Negative.   Cardiovascular: Negative.   Gastrointestinal: Negative.   Endocrine: Negative.   Genitourinary: Negative.   Musculoskeletal: Negative.   Hematological: Negative.    Psychiatric/Behavioral: Negative.     Past Medical History:  Diagnosis Date  . Acid reflux   . Allergy   . Anemia   . Anxiety   . History of hiatal hernia   . Hypertension   . Insomnia   . RLS (restless legs syndrome)   . Sensorineural hearing loss   . Von Willebrand disease (HCC)    PT STATES THIS IS MILD AND HAS NEVER HAD TO SEE HEMATOLOGIST-PT DID SAY THAT WHEN [place] YE[place] AGO HE BLED ALOT BUT IT WAS DUE TO NOT FOLLOWING POST OP INSTRUCTIONS     Past Surgical History:  Procedure Laterality Date  . CIRCUMCISION     AS AN ADULT  . COLONOSCOPY    . FOOT SURGERY Right    arch support  . HERNIA REPAIR    . INCISION AND DRAINAGE Right 02/04/2013   Procedure: INCISION AND DRAINAGE;  Surgeon: Sharma Covert, MD;  Location: [facility];  Service: Orthopedics;  Laterality: Right;  . INGUINAL HERNIA REPAIR Bilateral 05/13/2018   Procedure: LAPAROSCOPIC BILATERAL INGUINAL HERNIA REPAIR;  Surgeon: Henrene Dodge, MD;  Location: [facility]S;  Service: General;  Laterality: Bilateral;  . INGUINAL HERNIA REPAIR Right 06/17/2019   Procedure: HERNIA REPAIR INGUINAL ADULT WITH MESH-- Recurrent;  Surgeon: Henrene Dodge, MD;  Location: [facility] ORS;  Service: General;  Laterality: Right;  . OPEN REDUCTION INTERNAL FIXATION (ORIF) FINGER WITH RADIAL BONE GRAFT Right 02/04/2013   Procedure: OPEN  REDUCTION INTERNAL FIXATION (ORIF) right ring and small fingers;  Surgeon: Linna Hoff, MD;  Location: [facility];  Service: Orthopedics;  Laterality: Right;    Family History  Problem Relation Age of Onset  . Hyperlipidemia Mother   . Hyperlipidemia Father   . Colon cancer Neg Hx   . Esophageal cancer Neg Hx   . Rectal cancer Neg Hx   . Stomach cancer Neg Hx     Social History   Socioeconomic History  . Marital status: Married    Spouse name: Not on file  . Number of children: 4  . Years of education: 74  . Highest education level: Not on file  Occupational History  . Occupation:  Sales executive   Tobacco Use  . Smoking status: Former Smoker    Packs/day: 0.50    Years: 40.00    Pack years: 20.00    Types: Cigarettes    Quit date: 05/10/2016    Years since quitting: 4.1  . Smokeless tobacco: Never Used  Vaping Use  . Vaping Use: Former  . Quit date: 05/10/2016  Substance and Sexual Activity  . Alcohol use: No  . Drug use: Yes    Types: Marijuana    Comment: uses daily  . Sexual activity: Not on file  Other Topics Concern  . Not on file  Social History Narrative   Denies caffeine use    Social Determinants of Health   Financial Resource Strain: Not on file  Food Insecurity: Not on file  Transportation Needs: Not on file  Physical Activity: Not on file  Stress: Not on file  Social Connections: Not on file  Intimate Partner Violence: Not on file    Outpatient Encounter Medications as of 07/20/2020  Medication Sig  . amLODipine (NORVASC) 10 MG tablet TAKE 1 TABLET DAILY  . clobetasol (TEMOVATE) 0.05 % external solution SMARTSIG:Sparingly Topical Daily  . diclofenac Sodium (VOLTAREN) 1 % GEL Apply 2 g topically 4 (four) times daily.  Marland Kitchen EPINEPHRINE 0.3 mg/0.3 mL IJ SOAJ injection INJECT 0.3 ML (0.3 MG) INTO THE MUSCLE ONCE FOR 1 DOSE  . Fluocinolone Acetonide Scalp (DERMA-SMOOTHE/FS SCALP) 0.01 % OIL Apply 1 application topically daily.  . fluticasone (FLONASE) 50 MCG/ACT nasal spray Place 2 sprays into both nostrils daily.  Marland Kitchen ketoconazole (NIZORAL) 2 % shampoo SMARTSIG:Sparingly Topical Daily  . losartan (COZAAR) 50 MG tablet TAKE 1 TABLET DAILY  . pantoprazole (PROTONIX) 40 MG tablet TAKE 1 TABLET DAILY  . rOPINIRole (REQUIP) 0.25 MG tablet Take 1 tablet (0.25 mg total) by mouth in the morning and at bedtime.  . Rotigotine (NEUPRO) 1 MG/24HR PT24 Place 1 patch (1 mg total) onto the skin daily at 4 PM.  . triamcinolone cream (KENALOG) 0.1 % Apply 1 application topically 2 (two) times daily.  . [DISCONTINUED] doxycycline (VIBRAMYCIN) 100 MG capsule Take  100 mg by mouth 2 (two) times daily.  . [DISCONTINUED] Meloxicam 15 MG TBDP Take 1 tablet by mouth daily.  . meloxicam (MOBIC) 7.5 MG tablet Take 7.5 mg by mouth 2 (two) times daily.  . [DISCONTINUED] atorvastatin (LIPITOR) 10 MG tablet Take 1 tablet (10 mg total) by mouth daily. (Patient not taking: Reported on 07/20/2020)   No facility-administered encounter medications on file as of 07/20/2020.    Functional Status Survey: Is the patient deaf or have difficulty hearing?: No Does the patient have difficulty seeing, even when wearing glasses/contacts?: Yes Does the patient have difficulty concentrating, remembering, or making decisions?: Yes (has to make  notes to remember) Does the patient have difficulty walking or climbing stairs?: No Does the patient have difficulty dressing or bathing?: No Does the patient have difficulty doing errands alone such as visiting a doctor's office or shopping?: No   Fall Risk Assessment Fall Risk  07/20/2020 06/16/2020 05/05/2020 03/23/2020 01/07/2020  Falls in the past year? 0 - 0 0 0  Number falls in past yr: 0 0 0 0 0  Injury with Fall? 0 0 0 0 0  Follow up - - - - Falls evaluation completed    Depression Screen Depression screen [facility] 2/9 07/20/2020 03/23/2020 01/07/2020 12/30/2017  Decreased Interest 2 0 0 0  Down, Depressed, Hopeless 1 0 0 0  PHQ - 2 Score 3 0 0 0    6CIT Screen 07/20/2020  What Year? 0 points  What month? 0 points  What time? 0 points  Count back from 20 0 points  Months in reverse 0 points  Repeat phrase 4 points  Total Score 4   Advanced Directives Does patient have a HCPOA?    no If yes, name and contact information: n/a Does patient have a living will or MOST form?  no  Objective:   Vitals: BP 130/62   Pulse (!) 54   Temp 97.9 F (36.6 C)   Ht 5\' 6"  (1.676 m)   Wt 176 lb 6.4 oz (80 kg)   SpO2 99%   BMI 28.47 kg/m  Body mass index is 28.47 kg/m. No exam data present  Physical Exam Vitals and nursing note  reviewed.  Constitutional:      General: He is not in acute distress.    Appearance: Normal appearance. He is not toxic-appearing.  HENT:     Head: Normocephalic and atraumatic.  Eyes:     General: No scleral icterus.    Extraocular Movements: Extraocular movements intact.  Cardiovascular:     Heart sounds: Normal heart sounds. No murmur heard.   Pulmonary:     Effort: Pulmonary effort is normal. No respiratory distress.     Breath sounds: Normal breath sounds. No wheezing, rhonchi or rales.  Abdominal:     General: Abdomen is flat. Bowel sounds are normal.     Palpations: Abdomen is soft.  Skin:    General: Skin is warm and dry.     Capillary Refill: Capillary refill takes less than 2 seconds.     Comments: Nodules to posterior scalp improved - skin intact  Neurological:     Mental Status: He is alert and oriented to person, place, and time.     Motor: No weakness.     Gait: Gait normal.  Psychiatric:        Mood and Affect: Mood normal.        Behavior: Behavior normal.        Thought Content: Thought content normal.        Judgment: Judgment normal.     Assessment & Plan:    1. Encounter for initial annual wellness visit (AWV) in Medicare patient Advanced directive material given to patient; encouraged to fill out with partner and get notarized.  2. Aortic atherosclerosis ([place]) Did not tolerate atorvastatin.  Discussed that the standard is statin therapy to reduce risk for cardiovascular event.  Does not wish to try a different statin at this time.  Will start Zetia 10 mg daily.  Follow up for fasting lab work 3 months after starting Zetia to recheck LDL.    3. Centrilobular emphysema ([place])  No current issues with breathing or impact of activity on daily living.  Continue to monitor for now.  Discussed that rescue inhaler will likely be first line treatment should symptoms worsen and then can consider daily maintenance inhaler.   Reviewed patient's Family Medical  History Reviewed and updated list of patient's medical providers Assessment of cognitive impairment was done Assessed patient's functional ability Established a written schedule for health screening [place] Completed and Reviewed  Health Maintenance reviewed   Immunization History  Administered Date(s) Administered  . Tdap 02/04/2013   [place] declines COVID vaccine Due for Pneumonia vaccines - discussed increased risk with emphysema/smoking history.  Patient prefers to think about and will let us know if he wants to get these.  Health Maintenance  Topic Date Due  . COVID-19 Vaccine (1) 08/05/2020 (Originally 05/01/1967)  . PNA vac Low Risk Adult (1 of 2 - PCV13) 03/23/2021 (Originally 05/02/2019)  . INFLUENZA VACCINE  10/09/2020  . TETANUS/TDAP  02/05/2023  . COLONOSCOPY (Pts 45-28yrs Insurance coverage will need to be confirmed)  05/11/2026  . Hepatitis C Screening  Completed  . HPV VACCINES  Aged Out    Discussed health benefits of physical activity, and encouraged him to engage in regular exercise appropriate for his age and condition.   No orders of the defined types were placed in this encounter.   Current Outpatient Medications:  .  amLODipine (NORVASC) 10 MG tablet, TAKE 1 TABLET DAILY, Disp: 90 tablet, Rfl: 3 .  clobetasol (TEMOVATE) 0.05 % external solution, SMARTSIG:Sparingly Topical Daily, Disp: , Rfl:  .  diclofenac Sodium (VOLTAREN) 1 % GEL, Apply 2 g topically 4 (four) times daily., Disp: 50 g, Rfl: 1 .  EPINEPHRINE 0.3 mg/0.3 mL IJ SOAJ injection, INJECT 0.3 ML (0.3 MG) INTO THE MUSCLE ONCE FOR 1 DOSE, Disp: 2 each, Rfl: 11 .  Fluocinolone Acetonide Scalp (DERMA-SMOOTHE/FS SCALP) 0.01 % OIL, Apply 1 application topically daily., Disp: 120 mL, Rfl: 0 .  fluticasone (FLONASE) 50 MCG/ACT nasal spray, Place 2 sprays into both nostrils daily., Disp: , Rfl:  .  ketoconazole (NIZORAL) 2 % shampoo, SMARTSIG:Sparingly Topical Daily, Disp: , Rfl:   .  losartan (COZAAR) 50 MG tablet, TAKE 1 TABLET DAILY, Disp: 90 tablet, Rfl: 3 .  pantoprazole (PROTONIX) 40 MG tablet, TAKE 1 TABLET DAILY, Disp: 90 tablet, Rfl: 3 .  rOPINIRole (REQUIP) 0.25 MG tablet, Take 1 tablet (0.25 mg total) by mouth in the morning and at bedtime., Disp: 180 tablet, Rfl: 3 .  Rotigotine (NEUPRO) 1 MG/24HR PT24, Place 1 patch (1 mg total) onto the skin daily at 4 PM., Disp: 90 patch, Rfl: 3 .  triamcinolone cream (KENALOG) 0.1 %, Apply 1 application topically 2 (two) times daily., Disp: 30 g, Rfl: 0 .  meloxicam (MOBIC) 7.5 MG tablet, Take 7.5 mg by mouth 2 (two) times daily., Disp: , Rfl:  Medications Discontinued During This Encounter  Medication Reason  . doxycycline (VIBRAMYCIN) 100 MG capsule Error  . atorvastatin (LIPITOR) 10 MG tablet Change in therapy  . Meloxicam 15 MG TBDP Change in therapy    Next Medicare Wellness Visit in 12+ months

## 2020-07-26 ENCOUNTER — Ambulatory Visit: Payer: Medicare Other | Admitting: Nurse Practitioner

## 2020-07-27 ENCOUNTER — Other Ambulatory Visit: Payer: Self-pay

## 2020-07-27 ENCOUNTER — Encounter: Payer: Self-pay | Admitting: Orthopaedic Surgery

## 2020-07-27 ENCOUNTER — Ambulatory Visit (INDEPENDENT_AMBULATORY_CARE_PROVIDER_SITE_OTHER): Payer: Medicare Other | Admitting: Orthopaedic Surgery

## 2020-07-27 DIAGNOSIS — G5622 Lesion of ulnar nerve, left upper limb: Secondary | ICD-10-CM

## 2020-07-27 NOTE — Progress Notes (Signed)
Office Visit Note   Patient: Grant [name]           Date of Birth: Jan 25, 1956           MRN: [id number] Visit Date: 07/27/2020              Requested by: Eulogio Bear, NP [address]  [place] [address] PCP: Eulogio Bear, NP   Assessment & Plan: Visit Diagnoses:  1. Cubital tunnel syndrome on left     Plan: Nerve conduction studies show moderate left cubital tunnel syndrome.  These findings were reviewed with the patient in detail and based on findings I have recommended cubital tunnel release.  Details of the surgery including risks that include but not limited to infection, recurrence, hematoma reviewed with the patient in detail.  Rehab and recovery time also reviewed.  Questions encouraged and answered.  Follow-Up Instructions: Return for 2-week postop.   Orders:  No orders of the defined types were placed in this encounter.  No orders of the defined types were placed in this encounter.     Procedures: No procedures performed   Clinical Data: No additional findings.   Subjective: Chief Complaint  Patient presents with  . Left Elbow - Pain    [place] returns today for follow-up of recent nerve conduction studies to evaluate for left cubital tunnel syndrome.  Denies any changes in symptoms.   Review of Systems   Objective: Vital Signs: There were no vitals taken for this visit.  Physical Exam  Ortho Exam Exam shows continued numbness and tingling in the ulnar nerve distribution in the small and ring fingers.  Positive Tinel at the cubital tunnel.  Elbow range of motion is normal. Specialty Comments:  No specialty comments available.  Imaging: No results found.   PMFS History: Patient Active Problem List   Diagnosis Date Noted  . Aortic atherosclerosis ([place]) 06/27/2020  . Centrilobular emphysema ([place]) 06/27/2020  . Lateral epicondylitis, left elbow 05/31/2020  . Recurrent inguinal hernia of right side without obstruction or  gangrene   . Bilateral inguinal hernia 05/08/2018  . Cubital tunnel syndrome on left 04/09/2018  . Sensorineural hearing loss   . Restless leg syndrome 11/27/2017  . Flat feet, bilateral 04/03/2017  . Insomnia    Past Medical History:  Diagnosis Date  . Acid reflux   . Allergy   . Anemia   . Anxiety   . History of hiatal hernia   . Hypertension   . Insomnia   . RLS (restless legs syndrome)   . Sensorineural hearing loss   . Von Willebrand disease ([place])    PT STATES THIS IS MILD AND HAS NEVER HAD TO SEE HEMATOLOGIST-PT DID SAY THAT WHEN [place] HE BLED ALOT BUT IT WAS DUE TO NOT FOLLOWING POST OP INSTRUCTIONS     Family History  Problem Relation Age of Onset  . Hyperlipidemia Mother   . Hyperlipidemia Father   . Colon cancer Neg Hx   . Esophageal cancer Neg Hx   . Rectal cancer Neg Hx   . Stomach cancer Neg Hx     Past Surgical History:  Procedure Laterality Date  . CIRCUMCISION     AS AN ADULT  . COLONOSCOPY    . FOOT SURGERY Right    arch support  . HERNIA REPAIR    . INCISION AND DRAINAGE Right 02/04/2013   Procedure: INCISION AND DRAINAGE;  Surgeon: Janelle Floor  Caralyn Guile, MD;  Location: [place];  Service: Orthopedics;  Laterality: Right;  . INGUINAL HERNIA REPAIR Bilateral 05/13/2018   Procedure: LAPAROSCOPIC BILATERAL INGUINAL HERNIA REPAIR;  Surgeon: Olean Ree, MD;  Location: [facility];  Service: General;  Laterality: Bilateral;  . INGUINAL HERNIA REPAIR Right 06/17/2019   Procedure: HERNIA REPAIR INGUINAL ADULT WITH MESH-- Recurrent;  Surgeon: Olean Ree, MD;  Location: [facility];  Service: General;  Laterality: Right;  . OPEN REDUCTION INTERNAL FIXATION (ORIF) FINGER WITH RADIAL BONE GRAFT Right 02/04/2013   Procedure: OPEN REDUCTION INTERNAL FIXATION (ORIF) right ring and small fingers;  Surgeon: Linna Hoff, MD;  Location: [facility];  Service: Orthopedics;  Laterality: Right;   Social History   Occupational History  . Occupation:  Sales executive   Tobacco Use  . Smoking status: Former Smoker    Packs/day: 0.50    Years: 40.00    Pack years: 20.00    Types: Cigarettes    Quit date: 05/10/2016    Years since quitting: 4.2  . Smokeless tobacco: Never Used  Vaping Use  . Vaping Use: Former  . Quit date: 05/10/2016  Substance and Sexual Activity  . Alcohol use: No  . Drug use: Yes    Types: Marijuana    Comment: uses daily  . Sexual activity: Not on file

## 2020-07-28 ENCOUNTER — Ambulatory Visit: Payer: Medicare Other | Admitting: Nurse Practitioner

## 2020-07-29 ENCOUNTER — Encounter: Payer: Self-pay | Admitting: Nurse Practitioner

## 2020-08-09 ENCOUNTER — Other Ambulatory Visit: Payer: Self-pay | Admitting: Physician Assistant

## 2020-08-09 MED ORDER — HYDROCODONE-ACETAMINOPHEN 5-325 MG PO TABS: 1.0000 | ORAL_TABLET | Freq: Three times a day (TID) | ORAL | 0 refills | Status: DC | PRN

## 2020-08-09 MED ORDER — ONDANSETRON HCL 4 MG PO TABS: 4.0000 mg | ORAL_TABLET | Freq: Three times a day (TID) | ORAL | 0 refills | Status: DC | PRN

## 2020-08-10 DIAGNOSIS — G5622 Lesion of ulnar nerve, left upper limb: Secondary | ICD-10-CM

## 2020-08-11 ENCOUNTER — Other Ambulatory Visit: Payer: Self-pay

## 2020-08-11 ENCOUNTER — Encounter: Payer: Self-pay | Admitting: Urology

## 2020-08-11 ENCOUNTER — Encounter: Payer: Self-pay | Admitting: Orthopaedic Surgery

## 2020-08-11 DIAGNOSIS — N50819 Testicular pain, unspecified: Secondary | ICD-10-CM

## 2020-08-11 MED ORDER — MELOXICAM 7.5 MG PO TABS: 7.5000 mg | ORAL_TABLET | Freq: Two times a day (BID) | ORAL | 3 refills | Status: DC

## 2020-08-17 ENCOUNTER — Encounter: Payer: Self-pay | Admitting: Orthopaedic Surgery

## 2020-08-17 ENCOUNTER — Ambulatory Visit (INDEPENDENT_AMBULATORY_CARE_PROVIDER_SITE_OTHER): Payer: Medicare Other | Admitting: Orthopaedic Surgery

## 2020-08-17 DIAGNOSIS — G5622 Lesion of ulnar nerve, left upper limb: Secondary | ICD-10-CM

## 2020-08-17 NOTE — Progress Notes (Signed)
Post-Op Visit Note   Patient: Grant [name]           Date of Birth: [birth year]           MRN: [id number] Visit Date: 08/17/2020 PCP: Eulogio Bear, NP   Assessment & Plan:  Chief Complaint:  Chief Complaint  Patient presents with   Left Elbow - Routine Post Op   Visit Diagnoses:  1. Cubital tunnel syndrome on left     Plan:   Grant [name] is 1 week status post left cubital tunnel release in situ date of surgery 08/10/2020.  He is doing well at times.  He has definitely noticed an improvement in his ulnar nerve symptoms.  He still has some occasional tingling and numbness.  No real complaints otherwise.  Left elbow shows an intact surgical incision.  No signs of infection or drainage.  He has mild swelling and bruising around the elbow.  Neurovascular intact distally.  Dry bandage was applied today.  We will plan to remove the sutures next week.  He is to avoid prolonged elbow flexion as a general rule.  Questions encouraged and answered.  Follow-Up Instructions: Return in about 1 week (around 08/24/2020).   Orders:  No orders of the defined types were placed in this encounter.  No orders of the defined types were placed in this encounter.   Imaging: No results found.  PMFS History: Patient Active Problem List   Diagnosis Date Noted   Aortic atherosclerosis ([place]) 06/27/2020   Centrilobular emphysema ([place]) 06/27/2020   Lateral epicondylitis, left elbow 05/31/2020   Recurrent inguinal hernia of right side without obstruction or gangrene    Bilateral inguinal hernia 05/08/2018   Cubital tunnel syndrome on left 04/09/2018   Sensorineural hearing loss    Restless leg syndrome 11/27/2017   Flat feet, bilateral 04/03/2017   Insomnia    Past Medical History:  Diagnosis Date   Acid reflux    Allergy    Anemia    Anxiety    History of hiatal hernia    Hypertension    Insomnia    RLS (restless legs syndrome)    Sensorineural hearing loss    Von Willebrand  disease ([place])    PT STATES THIS IS MILD AND HAS NEVER HAD TO SEE HEMATOLOGIST-PT DID SAY THAT WHEN HE WAS CIRC[place] YE[place] AGO HE BLED ALOT BUT IT WAS DUE TO NOT FOLLOWING POST OP INSTRUCTIONS     Family History  Problem Relation Age of Onset   Hyperlipidemia Mother    Hyperlipidemia Father    Colon cancer Neg Hx    Esophageal cancer Neg Hx    Rectal cancer Neg Hx    Stomach cancer Neg Hx     Past Surgical History:  Procedure Laterality Date   CIRCUMCISION     AS AN ADULT   COLONOSCOPY     FOOT SURGERY Right    arch support   HERNIA REPAIR     INCISION AND DRAINAGE Right 02/04/2013   Procedure: INCISION AND DRAINAGE;  Surgeon: Linna Hoff, MD;  Location: [facility];  Service: Orthopedics;  Laterality: Right;   INGUINAL HERNIA REPAIR Bilateral 05/13/2018   Procedure: LAPAROSCOPIC BILATERAL INGUINAL HERNIA REPAIR;  Surgeon: Olean Ree, MD;  Location: [facility];  Service: General;  Laterality: Bilateral;   INGUINAL HERNIA REPAIR Right 06/17/2019   Procedure: HERNIA REPAIR INGUINAL ADULT WITH MESH-- Recurrent;  Surgeon: Olean Ree, MD;  Location: [facility];  Service: General;  Laterality: Right;   OPEN REDUCTION INTERNAL FIXATION (ORIF) FINGER WITH RADIAL BONE GRAFT Right 02/04/2013   Procedure: OPEN REDUCTION INTERNAL FIXATION (ORIF) right ring and small fingers;  Surgeon: Linna Hoff, MD;  Location: [facility];  Service: Orthopedics;  Laterality: Right;   Social History   Occupational History   Occupation: Sales executive   Tobacco Use   Smoking status: Former    Packs/day: 0.50    Years: 40.00    Pack years: 20.00    Types: Cigarettes    Quit date: 05/10/2016    Years since quitting: 4.2   Smokeless tobacco: Never  Vaping Use   Vaping Use: Former   Quit date: 05/10/2016  Substance and Sexual Activity   Alcohol use: No   Drug use: Yes    Types: Marijuana    Comment: uses daily   Sexual activity: Not on file

## 2020-08-18 ENCOUNTER — Encounter: Payer: Self-pay | Admitting: Nurse Practitioner

## 2020-08-24 ENCOUNTER — Encounter: Payer: Self-pay | Admitting: Orthopaedic Surgery

## 2020-08-24 ENCOUNTER — Ambulatory Visit (INDEPENDENT_AMBULATORY_CARE_PROVIDER_SITE_OTHER): Payer: Medicare Other | Admitting: Orthopaedic Surgery

## 2020-08-24 ENCOUNTER — Other Ambulatory Visit: Payer: Self-pay

## 2020-08-24 ENCOUNTER — Encounter: Payer: Self-pay | Admitting: Urology

## 2020-08-24 DIAGNOSIS — G5622 Lesion of ulnar nerve, left upper limb: Secondary | ICD-10-CM

## 2020-08-24 DIAGNOSIS — I700 Atherosclerosis of aorta: Secondary | ICD-10-CM

## 2020-08-24 MED ORDER — EZETIMIBE 10 MG PO TABS: 10.0000 mg | ORAL_TABLET | Freq: Every day | ORAL | 0 refills | Status: DC

## 2020-08-24 NOTE — Progress Notes (Signed)
Post-Op Visit Note   Patient: Grant [name]           Date of Birth: 1955/05/13           MRN: [id number] Visit Date: 08/24/2020 PCP: Eulogio Bear, NP   Assessment & Plan:  Chief Complaint:  Chief Complaint  Patient presents with   Left Elbow - Pain   Visit Diagnoses:  1. Cubital tunnel syndrome on left     Plan: Grant [name] is 2 weeks status post left cubital tunnel release in situ DOS 08/10/2020. He is doing well today. The elbow pain he was experiencing before surgery has resolved. He has occasional numbness and tingling in the hand but this is gradually improving. No fevers, chills, or infectious symptoms. He states a few of his sutures were coming loose and fell out, but his incision remains clean, dry and intact.  Left elbow exam shows an intact surgical incision without erythema or drainage. There are a few proximal sutures missing. Elbow ROM is within normal limits. 5/5 grip strength. Distal neurovascular exam intact.   Sutures were removed today. We discussed avoiding elbow flexion as a general rule in the future. Otherwise he is cleared to return to work next week if he prefers but ideally at 4 weeks post-op. We'll see him back in 6 weeks to recheck his elbow.  Follow-Up Instructions: Return in about 6 weeks (around 10/05/2020).   Orders:  No orders of the defined types were placed in this encounter.  No orders of the defined types were placed in this encounter.   Imaging: No new imaging.  PMFS History: Patient Active Problem List   Diagnosis Date Noted   Aortic atherosclerosis ([place]) 06/27/2020   Centrilobular emphysema ([place]) 06/27/2020   Lateral epicondylitis, left elbow 05/31/2020   Recurrent inguinal hernia of right side without obstruction or gangrene    Bilateral inguinal hernia 05/08/2018   Cubital tunnel syndrome on left 04/09/2018   Sensorineural hearing loss    Restless leg syndrome 11/27/2017   Flat feet, bilateral 04/03/2017   Insomnia     Past Medical History:  Diagnosis Date   Acid reflux    Allergy    Anemia    Anxiety    History of hiatal hernia    Hypertension    Insomnia    RLS (restless legs syndrome)    Sensorineural hearing loss    Von Willebrand disease ([place])    PT STATES THIS IS MILD AND HAS NEVER HAD TO SEE HEMATOLOGIST-PT DID SAY THAT WHEN HE WAS CIRCUMC[place] YE[place] AGO HE BLED ALOT BUT IT WAS DUE TO NOT FOLLOWING POST OP INSTRUCTIONS     Family History  Problem Relation Age of Onset   Hyperlipidemia Mother    Hyperlipidemia Father    Colon cancer Neg Hx    Esophageal cancer Neg Hx    Rectal cancer Neg Hx    Stomach cancer Neg Hx     Past Surgical History:  Procedure Laterality Date   CIRCUMCISION     AS AN ADULT   COLONOSCOPY     FOOT SURGERY Right    arch support   HERNIA REPAIR     INCISION AND DRAINAGE Right 02/04/2013   Procedure: INCISION AND DRAINAGE;  Surgeon: Linna Hoff, MD;  Location: [facility];  Service: Orthopedics;  Laterality: Right;   INGUINAL HERNIA REPAIR Bilateral 05/13/2018   Procedure: LAPAROSCOPIC BILATERAL INGUINAL HERNIA REPAIR;  Surgeon: Olean Ree, MD;  Location: [facility];  Service: General;  Laterality: Bilateral;   INGUINAL HERNIA REPAIR Right 06/17/2019   Procedure: HERNIA REPAIR INGUINAL ADULT WITH MESH-- Recurrent;  Surgeon: Olean Ree, MD;  Location: [facility];  Service: General;  Laterality: Right;   OPEN REDUCTION INTERNAL FIXATION (ORIF) FINGER WITH RADIAL BONE GRAFT Right 02/04/2013   Procedure: OPEN REDUCTION INTERNAL FIXATION (ORIF) right ring and small fingers;  Surgeon: Linna Hoff, MD;  Location: [facility];  Service: Orthopedics;  Laterality: Right;   Social History   Occupational History   Occupation: Sales executive   Tobacco Use   Smoking status: Former    Packs/day: 0.50    Years: 40.00    Pack years: 20.00    Types: Cigarettes    Quit date: 05/10/2016    Years since quitting: 4.2   Smokeless tobacco: Never  Vaping Use   Vaping  Use: Former   Quit date: 05/10/2016  Substance and Sexual Activity   Alcohol use: No   Drug use: Yes    Types: Marijuana    Comment: uses daily   Sexual activity: Not on file

## 2020-08-25 ENCOUNTER — Encounter: Payer: Self-pay | Admitting: Nurse Practitioner

## 2020-08-30 ENCOUNTER — Ambulatory Visit (INDEPENDENT_AMBULATORY_CARE_PROVIDER_SITE_OTHER): Payer: Medicare Other | Admitting: Nurse Practitioner

## 2020-08-30 ENCOUNTER — Encounter: Payer: Self-pay | Admitting: Nurse Practitioner

## 2020-08-30 ENCOUNTER — Other Ambulatory Visit: Payer: Self-pay

## 2020-08-30 VITALS — BP 110/64 | HR 63 | Temp 98.7°F | Ht 66.0 in | Wt 171.8 lb

## 2020-08-30 DIAGNOSIS — J432 Centrilobular emphysema: Secondary | ICD-10-CM

## 2020-08-30 DIAGNOSIS — D6800 Von Willebrand disease, unspecified: Secondary | ICD-10-CM

## 2020-08-30 DIAGNOSIS — R21 Rash and other nonspecific skin eruption: Secondary | ICD-10-CM

## 2020-08-30 DIAGNOSIS — I700 Atherosclerosis of aorta: Secondary | ICD-10-CM

## 2020-08-30 MED ORDER — MOMETASONE FUROATE 0.1 % EX OINT: TOPICAL_OINTMENT | Freq: Every day | CUTANEOUS | 0 refills | Status: AC

## 2020-08-30 NOTE — Progress Notes (Signed)
Subjective:    Patient ID: Grant [name], male    DOB: 1956-02-26, 65 y.o.   MRN: [id number]  HPI: Grant [name] is a 65 y.o. male presenting for ongoing rash.  Chief Complaint  Patient presents with   Rash    Cream is not helping with the rash on right leg. Rash is not spreading, just not going away   RASH Reports rash on right lower extremity on shin.  Was given triamcinolone cream in the past and this helped prevent it from spreading.  It is not going away, however.   Duration:  months  Location: right lower extremity - shin Itching: yes Burning: no Redness: yes Oozing: no Scaling: no Blisters: no Painful: no Fevers: no Change in detergents/soaps/personal care products: no Recent illness: no Recent travel:no History of same: no Context: stable Alleviating factors: nothing so far Treatments attempted:triamcinolone Shortness of breath: no  Throat/tongue swelling: no Myalgias/arthralgias: no  Allergies  Allergen Reactions   Bee Venom Anaphylaxis, Swelling and Rash   Asa [Aspirin] Other (See Comments)    Gi upset    Outpatient Encounter Medications as of 08/30/2020  Medication Sig   amLODipine (NORVASC) 10 MG tablet TAKE 1 TABLET DAILY   diclofenac Sodium (VOLTAREN) 1 % GEL Apply 2 g topically 4 (four) times daily.   EPINEPHRINE 0.3 mg/0.3 mL IJ SOAJ injection INJECT 0.3 ML (0.3 MG) INTO THE MUSCLE ONCE FOR 1 DOSE   ezetimibe (ZETIA) 10 MG tablet Take 1 tablet (10 mg total) by mouth daily.   fluticasone (FLONASE) 50 MCG/ACT nasal spray Place 2 sprays into both nostrils daily.   ketoconazole (NIZORAL) 2 % shampoo SMARTSIG:Sparingly Topical Daily   losartan (COZAAR) 50 MG tablet TAKE 1 TABLET DAILY   meloxicam (MOBIC) 7.5 MG tablet Take 1 tablet (7.5 mg total) by mouth 2 (two) times daily.   mometasone (ELOCON) 0.1 % ointment Apply topically daily for 14 days. Apply sparingly to affected area twice daily.  Do not use for more than 14 days consecutively.    ondansetron (ZOFRAN) 4 MG tablet Take 1 tablet (4 mg total) by mouth every 8 (eight) hours as needed for nausea or vomiting.   pantoprazole (PROTONIX) 40 MG tablet TAKE 1 TABLET DAILY   rOPINIRole (REQUIP) 0.25 MG tablet Take 1 tablet (0.25 mg total) by mouth in the morning and at bedtime.   Rotigotine (NEUPRO) 1 MG/24HR PT24 Place 1 patch (1 mg total) onto the skin daily at 4 PM.   [DISCONTINUED] clobetasol (TEMOVATE) 0.05 % external solution SMARTSIG:Sparingly Topical Daily   [DISCONTINUED] triamcinolone cream (KENALOG) 0.1 % Apply 1 application topically 2 (two) times daily.   [DISCONTINUED] Fluocinolone Acetonide Scalp (DERMA-SMOOTHE/FS SCALP) 0.01 % OIL Apply 1 application topically daily.   [DISCONTINUED] HYDROcodone-acetaminophen (NORCO) 5-325 MG tablet Take 1-2 tablets by mouth 3 (three) times daily as needed.   No facility-administered encounter medications on file as of 08/30/2020.    Patient Active Problem List   Diagnosis Date Noted   Aortic atherosclerosis ([place]) 06/27/2020   Centrilobular emphysema ([place]) 06/27/2020   Lateral epicondylitis, left elbow 05/31/2020   Recurrent inguinal hernia of right side without obstruction or gangrene    Bilateral inguinal hernia 05/08/2018   Cubital tunnel syndrome on left 04/09/2018   Sensorineural hearing loss    Restless leg syndrome 11/27/2017   Flat feet, bilateral 04/03/2017   Insomnia     Past Medical History:  Diagnosis Date   Acid reflux    Allergy  Anemia    Anxiety    History of hiatal hernia    Hypertension    Insomnia    RLS (restless legs syndrome)    Sensorineural hearing loss    Von Willebrand disease ([place])    PT STATES THIS IS MILD AND HAS NEVER HAD TO SEE HEMATOLOGIST-PT DID SAY THAT WHEN HE WAS [place] YE[place] AGO HE BLED ALOT BUT IT WAS DUE TO NOT FOLLOWING POST OP INSTRUCTIONS     Relevant past medical, surgical, family and social history reviewed and updated as indicated. Interim medical  history since our last visit reviewed.  Review of Systems Per HPI unless specifically indicated above     Objective:    BP 110/64   Pulse 63   Temp 98.7 F (37.1 C)   Ht 5\' 6"  (1.676 m)   Wt 171 lb 12.8 oz (77.9 kg)   SpO2 95%   BMI 27.73 kg/m   Wt Readings from Last 3 Encounters:  08/30/20 171 lb 12.8 oz (77.9 kg)  07/20/20 176 lb 6.4 oz (80 kg)  07/14/20 170 lb (77.1 kg)    Physical Exam Vitals and nursing note reviewed.  Constitutional:      General: He is not in acute distress.    Appearance: Normal appearance. He is not toxic-appearing.  Eyes:     General: No scleral icterus.    Extraocular Movements: Extraocular movements intact.  Musculoskeletal:        General: Normal range of motion.     Right lower leg: No edema.     Left lower leg: No edema.  Skin:    General: Skin is warm and dry.     Capillary Refill: Capillary refill takes less than 2 seconds.     Coloration: Skin is not jaundiced or pale.     Findings: Erythema and rash present.          Comments: Approximately 2 cm x 1 cm slightly erythematous patch on right lower extremity.  Irregular in shape.  No open areas, drainage, blisters, or warmth.    Neurological:     Mental Status: He is alert and oriented to person, place, and time.     Motor: No weakness.     Gait: Gait normal.  Psychiatric:        Mood and Affect: Mood normal.        Behavior: Behavior normal.        Thought Content: Thought content normal.        Judgment: Judgment normal.      Assessment & Plan:  1. Rash Likely some form of dermatitis - question eczema although location is not typical and presentation is not typical.  Will increase strength of topical steroid.  If dose increase does not completely resolve rash, consider antifungal vs. Consultation with Dermatologist.  - mometasone (ELOCON) 0.1 % ointment; Apply topically daily for 14 days. Apply sparingly to affected area twice daily.  Do not use for more than 14 days  consecutively.  Dispense: 45 g; Refill: 0    Follow up plan: Return if symptoms worsen or fail to improve.

## 2020-08-31 ENCOUNTER — Encounter: Payer: Self-pay | Admitting: Nurse Practitioner

## 2020-08-31 DIAGNOSIS — D6800 Von Willebrand disease, unspecified: Secondary | ICD-10-CM | POA: Insufficient documentation

## 2020-09-08 ENCOUNTER — Ambulatory Visit (INDEPENDENT_AMBULATORY_CARE_PROVIDER_SITE_OTHER): Payer: Medicare Other | Admitting: Urology

## 2020-09-08 ENCOUNTER — Other Ambulatory Visit: Payer: Self-pay

## 2020-09-08 VITALS — BP 152/66 | HR 61

## 2020-09-08 DIAGNOSIS — N50819 Testicular pain, unspecified: Secondary | ICD-10-CM

## 2020-09-08 MED ORDER — LIDOCAINE HCL 2 % IJ SOLN: 10.0000 mL | Freq: Once | INTRAMUSCULAR | Status: DC

## 2020-09-08 MED ORDER — BUPIVACAINE HCL 0.5 % IJ SOLN: 50.0000 mL | Freq: Once | INTRAMUSCULAR | Status: DC

## 2020-09-08 NOTE — Progress Notes (Signed)
09/08/2020 2:10 PM   [name] 12-Nov-1955 [id number]  Referring provider: Eulogio Bear, NP [place] [place] [address]  [place] [address]  Right testicular pain   HPI: Mr Keady is a 48QP here for followup for right orchalgia. He was previously on meloxicam but can no longer take the medication. He is request a spermatic cord block today.    PMH: Past Medical History:  Diagnosis Date   Acid reflux    Allergy    Anemia    Anxiety    History of hiatal hernia    Hypertension    Insomnia    RLS (restless legs syndrome)    Sensorineural hearing loss    Von Willebrand disease ([place])    PT STATES THIS IS MILD AND HAS NEVER HAD TO SEE HEMATOLOGIST-PT DID SAY THAT WHEN HE WAS [place] YE[place] AGO HE BLED ALOT BUT IT WAS DUE TO NOT FOLLOWING POST OP INSTRUCTIONS     Surgical History: Past Surgical History:  Procedure Laterality Date   CIRCUMCISION     AS AN ADULT   COLONOSCOPY     FOOT SURGERY Right    arch support   HERNIA REPAIR     INCISION AND DRAINAGE Right 02/04/2013   Procedure: INCISION AND DRAINAGE;  Surgeon: Linna Hoff, MD;  Location: [facility];  Service: Orthopedics;  Laterality: Right;   INGUINAL HERNIA REPAIR Bilateral 05/13/2018   Procedure: LAPAROSCOPIC BILATERAL INGUINAL HERNIA REPAIR;  Surgeon: Olean Ree, MD;  Location: [facility];  Service: General;  Laterality: Bilateral;   INGUINAL HERNIA REPAIR Right 06/17/2019   Procedure: HERNIA REPAIR INGUINAL ADULT WITH MESH-- Recurrent;  Surgeon: Olean Ree, MD;  Location: [facility];  Service: General;  Laterality: Right;   OPEN REDUCTION INTERNAL FIXATION (ORIF) FINGER WITH RADIAL BONE GRAFT Right 02/04/2013   Procedure: OPEN REDUCTION INTERNAL FIXATION (ORIF) right ring and small fingers;  Surgeon: Linna Hoff, MD;  Location: [facility];  Service: Orthopedics;  Laterality: Right;    Home Medications:  Allergies as of 09/08/2020       Reactions   Bee Venom Anaphylaxis, Swelling, Rash   Asa  [aspirin] Other (See Comments)   Gi upset        Medication List        Accurate as of September 08, 2020  2:10 PM. If you have any questions, ask your nurse or doctor.          STOP taking these medications    diclofenac Sodium 1 % Gel Commonly known as: Voltaren Stopped by: Nicolette Bang, MD   meloxicam 7.5 MG tablet Commonly known as: MOBIC Stopped by: Nicolette Bang, MD   ondansetron 4 MG tablet Commonly known as: Zofran Stopped by: Nicolette Bang, MD       TAKE these medications    amLODipine 10 MG tablet Commonly known as: NORVASC TAKE 1 TABLET DAILY   EPINEPHrine 0.3 mg/0.3 mL Soaj injection Commonly known as: EPI-PEN INJECT 0.3 ML (0.3 MG) INTO THE MUSCLE ONCE FOR 1 DOSE   ezetimibe 10 MG tablet Commonly known as: Zetia Take 1 tablet (10 mg total) by mouth daily.   fluticasone 50 MCG/ACT nasal spray Commonly known as: FLONASE Place 2 sprays into both nostrils daily.   ketoconazole 2 % shampoo Commonly known as: NIZORAL SMARTSIG:Sparingly Topical Daily   losartan 50 MG tablet Commonly known as: COZAAR TAKE 1 TABLET DAILY   mometasone 0.1 % ointment Commonly known as: ELOCON Apply topically daily  for 14 days. Apply sparingly to affected area twice daily.  Do not use for more than 14 days consecutively.   Neupro 1 MG/24HR Pt24 Generic drug: Rotigotine Place 1 patch (1 mg total) onto the skin daily at 4 PM.   pantoprazole 40 MG tablet Commonly known as: PROTONIX TAKE 1 TABLET DAILY   rOPINIRole 0.25 MG tablet Commonly known as: Requip Take 1 tablet (0.25 mg total) by mouth in the morning and at bedtime.        Allergies:  Allergies  Allergen Reactions   Bee Venom Anaphylaxis, Swelling and Rash   Asa [Aspirin] Other (See Comments)    Gi upset    Family History: Family History  Problem Relation Age of Onset   Hyperlipidemia Mother    Hyperlipidemia Father    Colon cancer Neg Hx    Esophageal cancer Neg Hx    Rectal  cancer Neg Hx    Stomach cancer Neg Hx     Social History:  reports that he quit smoking about 4 years ago. His smoking use included cigarettes. He has a 20.00 pack-year smoking history. He has never used smokeless tobacco. He reports current drug use. Drug: Marijuana. He reports that he does not drink alcohol.  ROS: All other review of systems were reviewed and are negative except what is noted above in HPI  Physical Exam: There were no vitals taken for this visit.  Constitutional:  Alert and oriented, No acute distress. HEENT: [place] AT, moist mucus membranes.  Trachea midline, no masses. Cardiovascular: No clubbing, cyanosis, or edema. Respiratory: Normal respiratory effort, no increased work of breathing. GI: Abdomen is soft, nontender, nondistended, no abdominal masses GU: No CVA tenderness. Circumcised phallus. No masses/lesions on penis, testis, scrotum. Prostate 40g smooth no nodules no induration.  Lymph: No cervical or inguinal lymphadenopathy. Skin: No rashes, bruises or suspicious lesions. Neurologic: Grossly intact, no focal deficits, moving all 4 extremities. Psychiatric: Normal mood and affect.  Laboratory Data: Lab Results  Component Value Date   WBC 7.2 06/16/2020   HGB 14.3 06/16/2020   HCT 42.8 06/16/2020   MCV 90.7 06/16/2020   PLT 266 06/16/2020    Lab Results  Component Value Date   CREATININE 0.96 10/25/2019    Lab Results  Component Value Date   PSA 1.1 10/25/2019   PSA 1.8 05/01/2018   PSA 1.4 09/15/2017    No results found for: TESTOSTERONE  Lab Results  Component Value Date   HGBA1C 5.4 10/25/2019    Urinalysis    Component Value Date/Time   APPEARANCEUR Clear 07/14/2020 0850   GLUCOSEU Negative 07/14/2020 0850   BILIRUBINUR Negative 07/14/2020 0850   PROTEINUR Negative 07/14/2020 0850   NITRITE Negative 07/14/2020 0850   LEUKOCYTESUR Negative 07/14/2020 0850    Lab Results  Component Value Date   LABMICR See below: 07/14/2020    WBCUA None seen 07/14/2020   LABEPIT 0-10 07/14/2020   BACTERIA None seen 07/14/2020    Pertinent Imaging:  No results found for this or any previous visit.  Results for orders placed during the hospital encounter of 12/07/08  US VenoUS Imaging Bilateral  Narrative Clinical Data: Bilateral foot pain, numbness, tingling  BILATERAL LOWER EXTREMITY VENOUS DOPPLER ULTRASOUND  Technique: Gray-scale sonography with compression, as well as color and duplex ultrasound, were performed to evaluate the deep venous system from the level of the common femoral vein through the popliteal and proximal calf veins.  Comparison:   none  Findings:  Normal compressibility of  the common femoral, superficial femoral, and popliteal veins, as well as the proximal calf veins.  No filling defects to suggest DVT on grayscale or color Doppler imaging.  Doppler waveforms show normal direction of venous flow, normal respiratory phasicity and response to augmentation.  IMPRESSION: No evidence of  lower extremity deep vein thrombosis.  Provider: Ulyess Mort  No results found for this or any previous visit.  No results found for this or any previous visit.  No results found for this or any previous visit.  No results found for this or any previous visit.  No results found for this or any previous visit.  No results found for this or any previous visit.   Cord block procedure   The patients right inguinal region was prepped with betadine. We then proceeded to inject 87ml of a half 0.5% bupivicaine and half 2% lidocaine on the floor of the right spermatic cord and then an additional 30ml of the same solution at the top of the cord. The patient tolerated the procedure well  Assessment & Plan:    1. Pain in testicle, unspecified laterality -right cord block performed today   No follow-ups on file.  Nicolette Bang, MD  [facility] [place]

## 2020-09-08 NOTE — Progress Notes (Signed)
Urological Symptom Review  Patient is experiencing the following symptoms: Hard to postpone urination Get up at night to urinate   Review of Systems  Gastrointestinal (upper)  : Indigestion/heartburn  Gastrointestinal (lower) : Negative for lower GI symptoms  Constitutional : Fatigue  Skin: Negative for skin symptoms  Eyes: Negative for eye symptoms  Ear/Nose/Throat : Sinus problems  Hematologic/Lymphatic: Negative for Hematologic/Lymphatic symptoms  Cardiovascular : Negative for cardiovascular symptoms  Respiratory : Shortness of breath  Endocrine: Negative for endocrine symptoms  Musculoskeletal: Back pain Joint pain  Neurological: Headaches  Psychologic: Negative for psychiatric symptoms

## 2020-09-10 ENCOUNTER — Encounter: Payer: Self-pay | Admitting: Urology

## 2020-09-11 ENCOUNTER — Encounter: Payer: Self-pay | Admitting: Urology

## 2020-09-19 ENCOUNTER — Encounter: Payer: Self-pay | Admitting: Urology

## 2020-09-19 ENCOUNTER — Encounter (INDEPENDENT_AMBULATORY_CARE_PROVIDER_SITE_OTHER): Payer: Self-pay

## 2020-09-29 ENCOUNTER — Encounter: Payer: Self-pay | Admitting: Urology

## 2020-10-03 ENCOUNTER — Ambulatory Visit (INDEPENDENT_AMBULATORY_CARE_PROVIDER_SITE_OTHER): Payer: Medicare Other | Admitting: Urology

## 2020-10-03 ENCOUNTER — Other Ambulatory Visit: Payer: Self-pay

## 2020-10-03 VITALS — BP 146/60 | HR 60

## 2020-10-03 DIAGNOSIS — N50819 Testicular pain, unspecified: Secondary | ICD-10-CM | POA: Diagnosis not present

## 2020-10-03 NOTE — Progress Notes (Signed)
Urological Symptom Review  Patient is experiencing the following symptoms: Get up at night to urinate   Review of Systems  Gastrointestinal (upper)  : Indigestion/heartburn  Gastrointestinal (lower) : Negative for lower GI symptoms  Constitutional : Negative for symptoms  Skin: Negative for skin symptoms  Eyes: Negative for eye symptoms  Ear/Nose/Throat : Sinus problems  Hematologic/Lymphatic: Negative for Hematologic/Lymphatic symptoms  Cardiovascular : Negative for cardiovascular symptoms  Respiratory : Negative for respiratory symptoms  Endocrine: Negative for endocrine symptoms  Musculoskeletal: Joint pain  Neurological: Negative for neurological symptoms  Psychologic: Negative for psychiatric symptoms

## 2020-10-04 ENCOUNTER — Encounter: Payer: Self-pay | Admitting: Urology

## 2020-10-05 ENCOUNTER — Encounter: Payer: Self-pay | Admitting: Orthopaedic Surgery

## 2020-10-05 ENCOUNTER — Ambulatory Visit (INDEPENDENT_AMBULATORY_CARE_PROVIDER_SITE_OTHER): Payer: Medicare Other | Admitting: Orthopaedic Surgery

## 2020-10-05 DIAGNOSIS — G5622 Lesion of ulnar nerve, left upper limb: Secondary | ICD-10-CM

## 2020-10-05 NOTE — Progress Notes (Signed)
Post-Op Visit Note   Patient: Grant [name]           Date of Birth: 28-Jul-1955           MRN: [id number] Visit Date: 10/05/2020 PCP: Grant Bear, NP   Assessment & Plan:  Chief Complaint:  Chief Complaint  Patient presents with   Left Elbow - Pain, Follow-up, Routine Post Op   Visit Diagnoses:  1. Cubital tunnel syndrome on left     Plan: Grant [name] is 6 weeks status post left cubital tunnel release.  Date of surgery 08/10/2020.  He states that he has some pain at times but overall the surgery has improved symptoms significantly.  He has returned back to work.  He has increased pain when he tried to lift up a heavy bag and it hurt over the medial epicondyle.  The numbness is also improving.  Left elbow shows fully healed surgical scar.  He has excellent range of motion.  Ulnar nerve is stable.  Based on findings impression is overuse of the flexor muscle mass.  I explained that this is to be expected from the surgery and he should take it easy with heavy lifting as it will likely flareup the surgical site.  No signs of complications from my assessment.  He can follow-up with [place] as needed.  Follow-Up Instructions: Return if symptoms worsen or fail to improve.   Orders:  No orders of the defined types were placed in this encounter.  No orders of the defined types were placed in this encounter.   Imaging: No results found.  PMFS History: Patient Active Problem List   Diagnosis Date Noted   Von Willebrand disease ([place]) 08/31/2020   Aortic atherosclerosis ([place]) 06/27/2020   Centrilobular emphysema ([place]) 06/27/2020   Lateral epicondylitis, left elbow 05/31/2020   Recurrent inguinal hernia of right side without obstruction or gangrene    Bilateral inguinal hernia 05/08/2018   Cubital tunnel syndrome on left 04/09/2018   Sensorineural hearing loss    Restless leg syndrome 11/27/2017   Flat feet, bilateral 04/03/2017   Insomnia    Past Medical History:  Diagnosis  Date   Acid reflux    Allergy    Anemia    Anxiety    History of hiatal hernia    Hypertension    Insomnia    RLS (restless legs syndrome)    Sensorineural hearing loss    Von Willebrand disease ([place])    PT STATES THIS IS MILD AND HAS NEVER HAD TO SEE HEMATOLOGIST-PT DID SAY THAT WHEN [place] HE BLED ALOT BUT IT WAS DUE TO NOT FOLLOWING POST OP INSTRUCTIONS     Family History  Problem Relation Age of Onset   Hyperlipidemia Mother    Hyperlipidemia Father    Colon cancer Neg Hx    Esophageal cancer Neg Hx    Rectal cancer Neg Hx    Stomach cancer Neg Hx     Past Surgical History:  Procedure Laterality Date   CIRCUMCISION     AS AN ADULT   COLONOSCOPY     FOOT SURGERY Right    arch support   HERNIA REPAIR     INCISION AND DRAINAGE Right 02/04/2013   Procedure: INCISION AND DRAINAGE;  Surgeon: Grant Hoff, MD;  Location: [facility];  Service: Orthopedics;  Laterality: Right;   INGUINAL HERNIA REPAIR Bilateral 05/13/2018   Procedure: LAPAROSCOPIC BILATERAL INGUINAL HERNIA REPAIR;  Surgeon: Grant Ree,  MD;  Location: [facility];  Service: General;  Laterality: Bilateral;   INGUINAL HERNIA REPAIR Right 06/17/2019   Procedure: HERNIA REPAIR INGUINAL ADULT WITH MESH-- Recurrent;  Surgeon: Grant Ree, MD;  Location: [facility];  Service: General;  Laterality: Right;   OPEN REDUCTION INTERNAL FIXATION (ORIF) FINGER WITH RADIAL BONE GRAFT Right 02/04/2013   Procedure: OPEN REDUCTION INTERNAL FIXATION (ORIF) right ring and small fingers;  Surgeon: Grant Hoff, MD;  Location: [facility];  Service: Orthopedics;  Laterality: Right;   Social History   Occupational History   Occupation: Sales executive   Tobacco Use   Smoking status: Former    Packs/day: 0.50    Years: 40.00    Pack years: 20.00    Types: Cigarettes    Quit date: 05/10/2016    Years since quitting: 4.4   Smokeless tobacco: Never  Vaping Use   Vaping Use: Former   Quit date: 05/10/2016   Substance and Sexual Activity   Alcohol use: No   Drug use: Yes    Types: Marijuana    Comment: uses daily   Sexual activity: Not on file

## 2020-10-05 NOTE — Telephone Encounter (Signed)
See below

## 2020-10-09 NOTE — Telephone Encounter (Signed)
Is he doing surgery?

## 2020-10-10 ENCOUNTER — Encounter: Payer: Self-pay | Admitting: Nurse Practitioner

## 2020-10-19 ENCOUNTER — Encounter: Payer: Self-pay | Admitting: Urology

## 2020-10-19 MED ORDER — LIDOCAINE HCL 2 % IJ SOLN: 10.0000 mL | Freq: Once | INTRAMUSCULAR | Status: DC

## 2020-10-19 MED ORDER — BUPIVACAINE HCL (PF) 0.25 % IJ SOLN: 10.0000 mL | Freq: Once | INTRAMUSCULAR | Status: DC

## 2020-10-19 NOTE — Progress Notes (Signed)
10/03/2020 9:00 AM   Grant [name] 1955-07-13 [id number]  Referring provider: Eulogio Bear, NP [phone] [facility] [address],  [place] [address]  Followup right testis pain   HPI: Grant [name] is a [id number] here for followup for right orchalgia. He noticed 2 days a relief from his right inguinal pain but no improvement in his right testis pain with the first cord block. The pain is sharp, intermittent mild to moderate and nonraditing.    PMH: Past Medical History:  Diagnosis Date   Acid reflux    Allergy    Anemia    Anxiety    History of hiatal hernia    Hypertension    Insomnia    RLS (restless legs syndrome)    Sensorineural hearing loss    Von Willebrand disease ([place])    PT STATES THIS IS MILD AND HAS NEVER HAD TO SEE HEMATOLOGIST-PT DID SAY THAT WHEN [place] WAS [place] AGO HE BLED ALOT BUT IT WAS DUE TO NOT FOLLOWING POST OP INSTRUCTIONS     Surgical History: Past Surgical History:  Procedure Laterality Date   CIRCUMCISION     AS AN ADULT   COLONOSCOPY     FOOT SURGERY Right    arch support   HERNIA REPAIR     INCISION AND DRAINAGE Right 02/04/2013   Procedure: INCISION AND DRAINAGE;  Surgeon: Linna Hoff, MD;  Location: [facility];  Service: Orthopedics;  Laterality: Right;   INGUINAL HERNIA REPAIR Bilateral 05/13/2018   Procedure: LAPAROSCOPIC BILATERAL INGUINAL HERNIA REPAIR;  Surgeon: Olean Ree, MD;  Location: [facility];  Service: General;  Laterality: Bilateral;   INGUINAL HERNIA REPAIR Right 06/17/2019   Procedure: HERNIA REPAIR INGUINAL ADULT WITH MESH-- Recurrent;  Surgeon: Olean Ree, MD;  Location: [facility];  Service: General;  Laterality: Right;   OPEN REDUCTION INTERNAL FIXATION (ORIF) FINGER WITH RADIAL BONE GRAFT Right 02/04/2013   Procedure: OPEN REDUCTION INTERNAL FIXATION (ORIF) right ring and small fingers;  Surgeon: Linna Hoff, MD;  Location: [place];  Service: Orthopedics;  Laterality: Right;    Home Medications:  Allergies as  of 10/03/2020       Reactions   Bee Venom Anaphylaxis, Swelling, Rash   Asa [aspirin] Other (See Comments)   Gi upset        Medication List        Accurate as of October 03, 2020 11:59 PM. If you have any questions, ask your nurse or doctor.          amLODipine 10 MG tablet Commonly known as: NORVASC TAKE 1 TABLET DAILY   EPINEPHrine 0.3 mg/0.3 mL Soaj injection Commonly known as: EPI-PEN INJECT 0.3 ML (0.3 MG) INTO THE MUSCLE ONCE FOR 1 DOSE   ezetimibe 10 MG tablet Commonly known as: Zetia Take 1 tablet (10 mg total) by mouth daily.   fluticasone 50 MCG/ACT nasal spray Commonly known as: FLONASE Place 2 sprays into both nostrils daily.   ketoconazole 2 % shampoo Commonly known as: NIZORAL SMARTSIG:Sparingly Topical Daily   losartan 50 MG tablet Commonly known as: COZAAR TAKE 1 TABLET DAILY   Neupro 1 MG/24HR Pt24 Generic drug: Rotigotine Place 1 patch (1 mg total) onto the skin daily at 4 PM.   pantoprazole 40 MG tablet Commonly known as: PROTONIX TAKE 1 TABLET DAILY   rOPINIRole 0.25 MG tablet Commonly known as: Requip Take 1 tablet (0.25 mg total) by mouth in the morning and at bedtime.  Allergies:  Allergies  Allergen Reactions   Bee Venom Anaphylaxis, Swelling and Rash   Asa [Aspirin] Other (See Comments)    Gi upset    Family History: Family History  Problem Relation Age of Onset   Hyperlipidemia Mother    Hyperlipidemia Father    Colon cancer Neg Hx    Esophageal cancer Neg Hx    Rectal cancer Neg Hx    Stomach cancer Neg Hx     Social History:  reports that he quit smoking about 4 years ago. His smoking use included cigarettes. He has a 20.00 pack-year smoking history. He has never used smokeless tobacco. He reports current drug use. Drug: Marijuana. He reports that he does not drink alcohol.  ROS: All other review of systems were reviewed and are negative except what is noted above in HPI  Physical Exam: BP (!) 146/60    Pulse 60   Constitutional:  Alert and oriented, No acute distress. HEENT: [place] AT, moist mucus membranes.  Trachea midline, no masses. Cardiovascular: No clubbing, cyanosis, or edema. Respiratory: Normal respiratory effort, no increased work of breathing. GI: Abdomen is soft, nontender, nondistended, no abdominal masses GU: No CVA tenderness.  Lymph: No cervical or inguinal lymphadenopathy. Skin: No rashes, bruises or suspicious lesions. Neurologic: Grossly intact, no focal deficits, moving all 4 extremities. Psychiatric: Normal mood and affect.  Laboratory Data: Lab Results  Component Value Date   WBC 7.2 06/16/2020   HGB 14.3 06/16/2020   HCT 42.8 06/16/2020   MCV 90.7 06/16/2020   PLT 266 06/16/2020    Lab Results  Component Value Date   CREATININE 0.96 10/25/2019    Lab Results  Component Value Date   PSA 1.1 10/25/2019   PSA 1.8 05/01/2018   PSA 1.4 09/15/2017    No results found for: TESTOSTERONE  Lab Results  Component Value Date   HGBA1C 5.4 10/25/2019    Urinalysis    Component Value Date/Time   APPEARANCEUR Clear 07/14/2020 0850   GLUCOSEU Negative 07/14/2020 0850   BILIRUBINUR Negative 07/14/2020 0850   PROTEINUR Negative 07/14/2020 0850   NITRITE Negative 07/14/2020 0850   LEUKOCYTESUR Negative 07/14/2020 0850    Lab Results  Component Value Date   LABMICR See below: 07/14/2020   WBCUA None seen 07/14/2020   LABEPIT 0-10 07/14/2020   BACTERIA None seen 07/14/2020    Pertinent Imaging:  No results found for this or any previous visit.  Results for orders placed during the hospital encounter of 12/07/08  US VenoUS Imaging Bilateral  Narrative Clinical Data: Bilateral foot pain, numbness, tingling  BILATERAL LOWER EXTREMITY VENOUS DOPPLER ULTRASOUND  Technique: Gray-scale sonography with compression, as well as color and duplex ultrasound, were performed to evaluate the deep venous system from the level of the common femoral vein  through the popliteal and proximal calf veins.  Comparison:   none  Findings:  Normal compressibility of  the common femoral, superficial femoral, and popliteal veins, as well as the proximal calf veins.  No filling defects to suggest DVT on grayscale or color Doppler imaging.  Doppler waveforms show normal direction of venous flow, normal respiratory phasicity and response to augmentation.  IMPRESSION: No evidence of  lower extremity deep vein thrombosis.  Provider: Ulyess Mort  No results found for this or any previous visit.  No results found for this or any previous visit.  No results found for this or any previous visit.  No results found for this or any previous visit.  No  results found for this or any previous visit.  No results found for this or any previous visit.    Cord block procedure   The patients right inguinal region was prepped with betadine. We then proceeded to inject 85m of a half 0.5% bupivicaine and half 2% lidocaine on the floor of the right spermatic cord and then an additional 573mof the same solution at the top of the cord. The patient tolerated the procedure well  Assessment & Plan:    1. Pain in testicle, unspecified laterality -cord block performed today. Patient with call with his response to the injection   No follow-ups on file.  PaNicolette BangMD  [facility] [place]

## 2020-10-19 NOTE — Progress Notes (Signed)
Subjective:    Patient ID: Grant [name], male    DOB: 1955-10-19, 65 y.o.   MRN: [id number]  HPI: Grant [name] is a 65 y.o. male presenting for sore in right nostril.  Chief Complaint  Patient presents with   Nose Problem    sore in my right nostril causing breathing issues through that nostril. Not sure of onset.    SORE IN NOSTRIL Was cleaning out building with dirt a couple of weeks ago.  Had some congestion for a couple of days after and was blowing his nose a lot.  Now, he has a sore in his nostril that will not go away. Onset: weeks Location: right nostril Duration: weeks Pain: no Severity: mild Characteristics: bleeds, sore Timing: comes and goes Aggravating Factors: blowing nose, touching it Relieving Factors: nothing tried Treatments attempted:  nothing tried Associated signs & symptoms: worse at night when he is laying odwn, after he blows nose, affects breathing through nostril  Allergies  Allergen Reactions   Bee Venom Anaphylaxis, Swelling and Rash   Asa [Aspirin] Other (See Comments)    Gi upset    Outpatient Encounter Medications as of 10/20/2020  Medication Sig   amLODipine (NORVASC) 10 MG tablet TAKE 1 TABLET DAILY   EPINEPHRINE 0.3 mg/0.3 mL IJ SOAJ injection INJECT 0.3 ML (0.3 MG) INTO THE MUSCLE ONCE FOR 1 DOSE   ezetimibe (ZETIA) 10 MG tablet Take 1 tablet (10 mg total) by mouth daily.   fluticasone (FLONASE) 50 MCG/ACT nasal spray Place 2 sprays into both nostrils daily.   ketoconazole (NIZORAL) 2 % shampoo SMARTSIG:Sparingly Topical Daily   losartan (COZAAR) 50 MG tablet TAKE 1 TABLET DAILY   meloxicam (MOBIC) 7.5 MG tablet    pantoprazole (PROTONIX) 40 MG tablet TAKE 1 TABLET DAILY   rOPINIRole (REQUIP) 0.25 MG tablet Take 1 tablet (0.25 mg total) by mouth in the morning and at bedtime.   Rotigotine (NEUPRO) 1 MG/24HR PT24 Place 1 patch (1 mg total) onto the skin daily at 4 PM.   sodium chloride (OCEAN) 0.65 % SOLN nasal spray Place 1 spray  into both nostrils as needed for congestion.   Facility-Administered Encounter Medications as of 10/20/2020  Medication   bupivacaine (MARCAINE) 0.5 % (with pres) injection 50 mL   bupivacaine (PF) (MARCAINE) 0.25 % injection 10 mL   lidocaine (XYLOCAINE) 2 % (with pres) injection 200 mg   lidocaine (XYLOCAINE) 2 % (with pres) injection 200 mg    Patient Active Problem List   Diagnosis Date Noted   Von Willebrand disease ([place]) 08/31/2020   Aortic atherosclerosis ([place]) 06/27/2020   Centrilobular emphysema ([place]) 06/27/2020   Lateral epicondylitis, left elbow 05/31/2020   Recurrent inguinal hernia of right side without obstruction or gangrene    Bilateral inguinal hernia 05/08/2018   Cubital tunnel syndrome on left 04/09/2018   Sensorineural hearing loss    Restless leg syndrome 11/27/2017   Flat feet, bilateral 04/03/2017   Insomnia     Past Medical History:  Diagnosis Date   Acid reflux    Allergy    Anemia    Anxiety    History of hiatal hernia    Hypertension    Insomnia    RLS (restless legs syndrome)    Sensorineural hearing loss    Von Willebrand disease ([place])    PT STATES THIS IS MILD AND HAS NEVER HAD TO SEE HEMATOLOGIST-PT DID SAY THAT WHEN HE WAS [place] AGO HE BLED  ALOT BUT IT WAS DUE TO NOT FOLLOWING POST OP INSTRUCTIONS     Relevant past medical, surgical, family and social history reviewed and updated as indicated. Interim medical history since our last visit reviewed.  Review of Systems  Constitutional: Negative.  Negative for activity change, appetite change, chills, diaphoresis, fatigue and fever.  HENT:  Positive for nosebleeds and rhinorrhea. Negative for congestion, ear discharge, ear pain, mouth sores, postnasal drip, sinus pressure, sinus pain, sneezing and sore throat.   Respiratory: Negative.    Cardiovascular: Negative.   Per HPI unless specifically indicated above     Objective:    BP 126/78   Pulse 65   Temp 98.6 F  (37 C)   Ht '5\' 6"'$  (1.676 m)   Wt 169 lb 9.6 oz (76.9 kg)   SpO2 98%   BMI 27.37 kg/m   Wt Readings from Last 3 Encounters:  10/20/20 169 lb 9.6 oz (76.9 kg)  10/20/20 171 lb (77.6 kg)  08/30/20 171 lb 12.8 oz (77.9 kg)    Physical Exam Vitals and nursing note reviewed.  Constitutional:      General: He is not in acute distress.    Appearance: Normal appearance. He is not toxic-appearing.  HENT:     Head: Normocephalic and atraumatic.     Right Ear: Tympanic membrane, ear canal and external ear normal. There is no impacted cerumen.     Left Ear: Tympanic membrane, ear canal and external ear normal. There is no impacted cerumen.     Nose: Congestion present. No rhinorrhea.     Comments: Dried blood noted right lateral nostril    Mouth/Throat:     Mouth: Mucous membranes are moist.     Pharynx: Oropharynx is clear. No oropharyngeal exudate or posterior oropharyngeal erythema.  Eyes:     Extraocular Movements: Extraocular movements intact.  Skin:    General: Skin is warm and dry.     Coloration: Skin is not jaundiced or pale.     Findings: No erythema.  Neurological:     Mental Status: He is alert and oriented to person, place, and time.  Psychiatric:        Mood and Affect: Mood normal.        Behavior: Behavior normal.        Thought Content: Thought content normal.        Judgment: Judgment normal.       Assessment & Plan:  1. Acute rhinitis Acute.  With irritated nare.  Start saline rinses and apply vaseline ointment.  Follow up if not improving.   - sodium chloride (OCEAN) 0.65 % SOLN nasal spray; Place 1 spray into both nostrils as needed for congestion.  Dispense: 88 mL; Refill: 0    Follow up plan: Return if symptoms worsen or fail to improve.

## 2020-10-20 ENCOUNTER — Telehealth (INDEPENDENT_AMBULATORY_CARE_PROVIDER_SITE_OTHER): Payer: Medicare Other | Admitting: Urology

## 2020-10-20 ENCOUNTER — Encounter: Payer: Self-pay | Admitting: Orthopaedic Surgery

## 2020-10-20 ENCOUNTER — Encounter: Payer: Self-pay | Admitting: Urology

## 2020-10-20 ENCOUNTER — Ambulatory Visit (INDEPENDENT_AMBULATORY_CARE_PROVIDER_SITE_OTHER): Payer: Medicare Other | Admitting: Nurse Practitioner

## 2020-10-20 ENCOUNTER — Encounter: Payer: Self-pay | Admitting: Nurse Practitioner

## 2020-10-20 ENCOUNTER — Other Ambulatory Visit: Payer: Self-pay

## 2020-10-20 ENCOUNTER — Ambulatory Visit: Payer: Medicare Other | Admitting: Nurse Practitioner

## 2020-10-20 ENCOUNTER — Ambulatory Visit (INDEPENDENT_AMBULATORY_CARE_PROVIDER_SITE_OTHER): Payer: Medicare Other | Admitting: Orthopaedic Surgery

## 2020-10-20 VITALS — Ht 66.0 in | Wt 171.0 lb

## 2020-10-20 VITALS — BP 126/78 | HR 65 | Temp 98.6°F | Ht 66.0 in | Wt 169.6 lb

## 2020-10-20 DIAGNOSIS — J00 Acute nasopharyngitis [common cold]: Secondary | ICD-10-CM | POA: Diagnosis not present

## 2020-10-20 DIAGNOSIS — G5622 Lesion of ulnar nerve, left upper limb: Secondary | ICD-10-CM

## 2020-10-20 DIAGNOSIS — N50819 Testicular pain, unspecified: Secondary | ICD-10-CM | POA: Insufficient documentation

## 2020-10-20 MED ORDER — SALINE SPRAY 0.65 % NA SOLN
1.0000 | NASAL | 0 refills | Status: DC | PRN
Start: 2020-10-20 — End: 2021-01-02

## 2020-10-20 NOTE — Progress Notes (Signed)
Patient rescheduled

## 2020-10-21 NOTE — Progress Notes (Signed)
Post-Op Visit Note   Patient: Grant [name]           Date of Birth: 1955-04-18           MRN: [id number] Visit Date: 10/20/2020 PCP: Eulogio Bear, NP   Assessment & Plan:  Chief Complaint:  Chief Complaint  Patient presents with   Left Elbow - Follow-up    Left cubital tunnel release 08/10/2020   Visit Diagnoses:  1. Cubital tunnel syndrome on left     Plan: Mr. Bartold is 8 weeks status post left cubital tunnel release on 08/10/2020.  Overall doing well.  Reports some tenderness around the aspect of the elbow.  He has definitely noticed an improvement in the numbness and tingling in the ulnar distribution.  He feels that sometimes he has no numbness at all.  Left elbow scars fully healed.  Good range of motion of the elbow.  Ulnar nerve is stable.  Mr. Scarce has recovered quite well from the cubital tunnel release.  He has no complaints at this time.  He will increase activity as tolerated to the left upper extremity.  Discussed avoidance of too much elbow flexion to help prevent and minimize symptoms.  Follow-up as needed.  Follow-Up Instructions: Return if symptoms worsen or fail to improve.   Orders:  No orders of the defined types were placed in this encounter.  No orders of the defined types were placed in this encounter.   Imaging: No results found.  PMFS History: Patient Active Problem List   Diagnosis Date Noted   Pain in testicle 10/20/2020   Von Willebrand disease ([place]) 08/31/2020   Aortic atherosclerosis ([place]) 06/27/2020   Centrilobular emphysema ([place]) 06/27/2020   Lateral epicondylitis, left elbow 05/31/2020   Recurrent inguinal hernia of right side without obstruction or gangrene    Bilateral inguinal hernia 05/08/2018   Cubital tunnel syndrome on left 04/09/2018   Sensorineural hearing loss    Restless leg syndrome 11/27/2017   Flat feet, bilateral 04/03/2017   Insomnia    Past Medical History:  Diagnosis Date   Acid reflux    Allergy     Anemia    Anxiety    History of hiatal hernia    Hypertension    Insomnia    RLS (restless legs syndrome)    Sensorineural hearing loss    Von Willebrand disease ([place])    PT STATES THIS IS MILD AND HAS NEVER HAD TO SEE HEMATOLOGIST-PT DID SAY THAT WHEN [place] HE BLED ALOT BUT IT WAS DUE TO NOT FOLLOWING POST OP INSTRUCTIONS     Family History  Problem Relation Age of Onset   Hyperlipidemia Mother    Hyperlipidemia Father    Colon cancer Neg Hx    Esophageal cancer Neg Hx    Rectal cancer Neg Hx    Stomach cancer Neg Hx     Past Surgical History:  Procedure Laterality Date   CIRCUMCISION     AS AN ADULT   COLONOSCOPY     FOOT SURGERY Right    arch support   HERNIA REPAIR     INCISION AND DRAINAGE Right 02/04/2013   Procedure: INCISION AND DRAINAGE;  Surgeon: Linna Hoff, MD;  Location: [place];  Service: Orthopedics;  Laterality: Right;   INGUINAL HERNIA REPAIR Bilateral 05/13/2018   Procedure: LAPAROSCOPIC BILATERAL INGUINAL HERNIA REPAIR;  Surgeon: Olean Ree, MD;  Location: [facility];  Service: General;  Laterality: Bilateral;  INGUINAL HERNIA REPAIR Right 06/17/2019   Procedure: HERNIA REPAIR INGUINAL ADULT WITH MESH-- Recurrent;  Surgeon: Olean Ree, MD;  Location: [facility];  Service: General;  Laterality: Right;   OPEN REDUCTION INTERNAL FIXATION (ORIF) FINGER WITH RADIAL BONE GRAFT Right 02/04/2013   Procedure: OPEN REDUCTION INTERNAL FIXATION (ORIF) right ring and small fingers;  Surgeon: Linna Hoff, MD;  Location: [place];  Service: Orthopedics;  Laterality: Right;   Social History   Occupational History   Occupation: Sales executive   Tobacco Use   Smoking status: Former    Packs/day: 0.50    Years: 40.00    Pack years: 20.00    Types: Cigarettes    Quit date: 05/10/2016    Years since quitting: 4.4   Smokeless tobacco: Never  Vaping Use   Vaping Use: Former   Quit date: 05/10/2016  Substance and Sexual Activity   Alcohol  use: No   Drug use: Yes    Types: Marijuana    Comment: uses daily   Sexual activity: Not on file

## 2020-10-23 NOTE — Telephone Encounter (Signed)
Please advise 

## 2020-10-25 NOTE — Telephone Encounter (Signed)
Please see patient question. 

## 2020-11-01 ENCOUNTER — Encounter: Payer: Self-pay | Admitting: Family Medicine

## 2020-11-01 ENCOUNTER — Other Ambulatory Visit: Payer: Self-pay

## 2020-11-01 ENCOUNTER — Ambulatory Visit (INDEPENDENT_AMBULATORY_CARE_PROVIDER_SITE_OTHER): Payer: Medicare Other | Admitting: Urology

## 2020-11-01 VITALS — BP 147/61 | HR 50

## 2020-11-01 DIAGNOSIS — N50819 Testicular pain, unspecified: Secondary | ICD-10-CM

## 2020-11-01 LAB — URINALYSIS, ROUTINE W REFLEX MICROSCOPIC
Bilirubin, UA: NEGATIVE
Glucose, UA: NEGATIVE
Ketones, UA: NEGATIVE
Nitrite, UA: NEGATIVE
Protein,UA: NEGATIVE
RBC, UA: NEGATIVE
Specific Gravity, UA: 1.01 (ref 1.005–1.030)
Urobilinogen, Ur: 0.2 mg/dL (ref 0.2–1.0)
pH, UA: 6.5 (ref 5.0–7.5)

## 2020-11-01 LAB — MICROSCOPIC EXAMINATION
Bacteria, UA: NONE SEEN
Epithelial Cells (non renal): NONE SEEN /hpf (ref 0–10)
RBC, Urine: NONE SEEN /hpf (ref 0–2)
Renal Epithel, UA: NONE SEEN /hpf
WBC, UA: NONE SEEN /hpf (ref 0–5)

## 2020-11-01 NOTE — Progress Notes (Signed)
11/01/2020 11:08 AM   Grant [name] [birth year] [id number]  Referring provider: Eulogio Bear, NP [facility] [address]  [place] [address]  Right testicular pain   HPI: Grant [name] is a [id number] here for followup for right testicular pain. The previous spermatic cord block failed to improve is right testis and inguinal pain. He is currently seeing neurology and is desiring to see pain management. He has no new complaints today   PMH: Past Medical History:  Diagnosis Date   Acid reflux    Allergy    Anemia    Anxiety    History of hiatal hernia    Hypertension    Insomnia    RLS (restless legs syndrome)    Sensorineural hearing loss    Von Willebrand disease ([place])    PT STATES THIS IS MILD AND HAS NEVER HAD TO SEE HEMATOLOGIST-PT DID SAY THAT WHEN [place] HE BLED ALOT BUT IT WAS DUE TO NOT FOLLOWING POST OP INSTRUCTIONS     Surgical History: Past Surgical History:  Procedure Laterality Date   CIRCUMCISION     AS AN ADULT   COLONOSCOPY     FOOT SURGERY Right    arch support   HERNIA REPAIR     INCISION AND DRAINAGE Right 02/04/2013   Procedure: INCISION AND DRAINAGE;  Surgeon: Grant Hoff, MD;  Location: [facility];  Service: Orthopedics;  Laterality: Right;   INGUINAL HERNIA REPAIR Bilateral 05/13/2018   Procedure: LAPAROSCOPIC BILATERAL INGUINAL HERNIA REPAIR;  Surgeon: Grant Ree, MD;  Location: [facility];  Service: General;  Laterality: Bilateral;   INGUINAL HERNIA REPAIR Right 06/17/2019   Procedure: HERNIA REPAIR INGUINAL ADULT WITH MESH-- Recurrent;  Surgeon: Grant Ree, MD;  Location: [facility];  Service: General;  Laterality: Right;   OPEN REDUCTION INTERNAL FIXATION (ORIF) FINGER WITH RADIAL BONE GRAFT Right 02/04/2013   Procedure: OPEN REDUCTION INTERNAL FIXATION (ORIF) right ring and small fingers;  Surgeon: Grant Hoff, MD;  Location: [facility];  Service: Orthopedics;  Laterality: Right;    Home Medications:  Allergies as  of 11/01/2020       Reactions   Bee Venom Anaphylaxis, Swelling, Rash   Asa [aspirin] Other (See Comments)   Gi upset        Medication List        Accurate as of November 01, 2020 11:08 AM. If you have any questions, ask your nurse or doctor.          amLODipine 10 MG tablet Commonly known as: NORVASC TAKE 1 TABLET DAILY   EPINEPHrine 0.3 mg/0.3 mL Soaj injection Commonly known as: EPI-PEN INJECT 0.3 ML (0.3 MG) INTO THE MUSCLE ONCE FOR 1 DOSE   ezetimibe 10 MG tablet Commonly known as: Zetia Take 1 tablet (10 mg total) by mouth daily.   fluticasone 50 MCG/ACT nasal spray Commonly known as: FLONASE Place 2 sprays into both nostrils daily.   ketoconazole 2 % shampoo Commonly known as: NIZORAL SMARTSIG:Sparingly Topical Daily   losartan 50 MG tablet Commonly known as: COZAAR TAKE 1 TABLET DAILY   meloxicam 7.5 MG tablet Commonly known as: MOBIC   Neupro 1 MG/24HR Pt24 Generic drug: Rotigotine Place 1 patch (1 mg total) onto the skin daily at 4 PM.   pantoprazole 40 MG tablet Commonly known as: PROTONIX TAKE 1 TABLET DAILY   rOPINIRole 0.25 MG tablet Commonly known as: Requip Take 1 tablet (0.25 mg total) by mouth in  the morning and at bedtime.   sodium chloride 0.65 % Soln nasal spray Commonly known as: OCEAN Place 1 spray into both nostrils as needed for congestion.        Allergies:  Allergies  Allergen Reactions   Bee Venom Anaphylaxis, Swelling and Rash   Asa [Aspirin] Other (See Comments)    Gi upset    Family History: Family History  Problem Relation Age of Onset   Hyperlipidemia Mother    Hyperlipidemia Father    Colon cancer Neg Hx    Esophageal cancer Neg Hx    Rectal cancer Neg Hx    Stomach cancer Neg Hx     Social History:  reports that he quit smoking about 4 years ago. His smoking use included cigarettes. He has a 20.00 pack-year smoking history. He has never used smokeless tobacco. He reports current drug use. Drug:  Marijuana. He reports that he does not drink alcohol.  ROS: All other review of systems were reviewed and are negative except what is noted above in HPI  Physical Exam: BP (!) 147/61   Pulse (!) 50   Constitutional:  Alert and oriented, No acute distress. HEENT: [place] AT, moist mucus membranes.  Trachea midline, no masses. Cardiovascular: No clubbing, cyanosis, or edema. Respiratory: Normal respiratory effort, no increased work of breathing. GI: Abdomen is soft, nontender, nondistended, no abdominal masses GU: No CVA tenderness.  Lymph: No cervical or inguinal lymphadenopathy. Skin: No rashes, bruises or suspicious lesions. Neurologic: Grossly intact, no focal deficits, moving all 4 extremities. Psychiatric: Normal mood and affect.  Laboratory Data: Lab Results  Component Value Date   WBC 7.2 06/16/2020   HGB 14.3 06/16/2020   HCT 42.8 06/16/2020   MCV 90.7 06/16/2020   PLT 266 06/16/2020    Lab Results  Component Value Date   CREATININE 0.96 10/25/2019    Lab Results  Component Value Date   PSA 1.1 10/25/2019   PSA 1.8 05/01/2018   PSA 1.4 09/15/2017    No results found for: TESTOSTERONE  Lab Results  Component Value Date   HGBA1C 5.4 10/25/2019    Urinalysis    Component Value Date/Time   APPEARANCEUR Clear 07/14/2020 0850   GLUCOSEU Negative 07/14/2020 0850   BILIRUBINUR Negative 07/14/2020 0850   PROTEINUR Negative 07/14/2020 0850   NITRITE Negative 07/14/2020 0850   LEUKOCYTESUR Negative 07/14/2020 0850    Lab Results  Component Value Date   LABMICR See below: 07/14/2020   WBCUA None seen 07/14/2020   LABEPIT 0-10 07/14/2020   BACTERIA None seen 07/14/2020    Pertinent Imaging:  No results found for this or any previous visit.  Results for orders placed during the hospital encounter of 12/07/08  US VenoUS Imaging Bilateral  Narrative Clinical Data: Bilateral foot pain, numbness, tingling  BILATERAL LOWER EXTREMITY VENOUS DOPPLER  ULTRASOUND  Technique: Gray-scale sonography with compression, as well as color and duplex ultrasound, were performed to evaluate the deep venous system from the level of the common femoral vein through the popliteal and proximal calf veins.  Comparison:   none  Findings:  Normal compressibility of  the common femoral, superficial femoral, and popliteal veins, as well as the proximal calf veins.  No filling defects to suggest DVT on grayscale or color Doppler imaging.  Doppler waveforms show normal direction of venous flow, normal respiratory phasicity and response to augmentation.  IMPRESSION: No evidence of  lower extremity deep vein thrombosis.  Provider: Ulyess Mort  No results found for this  or any previous visit.  No results found for this or any previous visit.  No results found for this or any previous visit.  No results found for this or any previous visit.  No results found for this or any previous visit.  No results found for this or any previous visit.   Assessment & Plan:    1. Pain in testicle, unspecified laterality Referral to neurology/pain management - Ambulatory referral to Neurology   No follow-ups on file.  Nicolette Bang, MD  [facility] [place]

## 2020-11-01 NOTE — Progress Notes (Signed)
Urological Symptom Review  Patient is experiencing the following symptoms: Frequent urination   Review of Systems  Gastrointestinal (upper)  : Indigestion/heartburn  Gastrointestinal (lower) : Negative for lower GI symptoms  Constitutional : Fatigue  Skin: Negative for skin symptoms  Eyes: Negative for eye symptoms  Ear/Nose/Throat : Sinus problems  Hematologic/Lymphatic: Negative for Hematologic/Lymphatic symptoms  Cardiovascular : Negative for cardiovascular symptoms  Respiratory : Negative for respiratory symptoms  Endocrine: Negative for endocrine symptoms  Musculoskeletal: Back pain Joint pain  Neurological: Negative for neurological symptoms  Psychologic: Negative for psychiatric symptoms

## 2020-11-06 ENCOUNTER — Other Ambulatory Visit: Payer: Self-pay | Admitting: Nurse Practitioner

## 2020-11-06 DIAGNOSIS — I700 Atherosclerosis of aorta: Secondary | ICD-10-CM

## 2020-11-07 ENCOUNTER — Encounter: Payer: Self-pay | Admitting: Urology

## 2020-11-14 ENCOUNTER — Encounter: Payer: Self-pay | Admitting: Urology

## 2020-11-15 NOTE — Telephone Encounter (Signed)
Grant [name], Can you check on referral for patient please?

## 2020-11-24 ENCOUNTER — Encounter: Payer: Self-pay | Admitting: Nurse Practitioner

## 2020-11-24 NOTE — Telephone Encounter (Signed)
Veva Holes, Can you check on this patient referral please?

## 2021-01-01 NOTE — Patient Instructions (Signed)
Below is our plan:  We will continue Neupro patch daily. Alternate sites. Use Flonase or hydrocortisone cream at site that you want to use patch about 20 minutes before placing patch. Try extended release melatonin (REMfresh or ZZZquil). I will talk to Dr Rexene Alberts about repeat sleep study.   Please make sure you are staying well hydrated. I recommend 50-60 ounces daily. Well balanced diet and regular exercise encouraged. Consistent sleep schedule with 6-8 hours recommended.   Please continue follow up with care team as directed.   Follow up with me pending sleep study results.   You may receive a survey regarding today's visit. I encourage you to leave honest feed back as I do use this information to improve patient care. Thank you for seeing me today!

## 2021-01-01 NOTE — Progress Notes (Signed)
Chief Complaint  Patient presents with   Follow-up    Rm 2, alone. Here for yearly RLS f/u. Pt reports doing well. Pt reports drinking more water to help w his RLS. Pt would like to discuss application sites and SE from Neupro.      HISTORY OF PRESENT ILLNESS: 01/02/21 ALL: Grant [name] returns for follow up for PLMD. He continues Neupro patch 1mg  daily and ropinirole 0.25mg  1-2 times daily. He reports significant improvement in restless leg symptoms. He is sleeping better but does continue to wake up multiple times during the night. No obvious etiology. He monitors sleep on a Fitbit like device. He sleeps for about 5-6 hours a night. Device records that he is getting about 20% REM sleep. He feels that he continues to have excessive daytime sleepiness. He states that sleepiness waxes and wanes. ESS score is 16/24 today and he feels this is pretty accurate reflexion of his sleepiness. He has a hard time staying awake if sitting still and states he has felt like he was going to nod off while driving on occasion. He has never actually fallen asleep while driving. Split night study in 2018 that showed overall normal AHI with mild OSA in supine and REM sleep. He drinks about 2 cans of green tea daily. He is drinking 4-6 bottles of water daily. He feels decreased caffeine helps with RLS but he is more sleepy. He does wake up 2-3 times a night, usually to use restroom. Most of the time he can get back to sleep but sometimes it can take about an hour. He took melatonin several years ago but wasn't sure it helped but admits that he was having more difficulty with RLS then. He reports getting addicted to "sleep meds" with previous PCP. He thinks it was a hypnotic.   01/03/2020 ALL:  Grant [name] is a 65 y.o. male here today for follow up for PLMD and RLS. He was switched to Neupro patch in 10/2019. He continues ropinirole 0.25mg  BID as needed. He has adjusted well to using patch. He feels daytime sleepiness has  improved. He was started back on gabapentin with GI, however, he did not tolerate it well. He reports more depression when taking it. He has discontinued it and feeling well.    HISTORY (copied from my note on 07/01/2019)  Grant [name] is a 65 y.o. male here today for follow up for RLS. Sleep study in 07/2016 showed normal AHI but severe PMLD. He was previously taking 13 tablets of 0.25mg  ropinirole throughout the day. We swithced to extended release in 10/2018. He reports taking ropinirole XL 2 mg twice daily for a couple of months. He continues to have leg twitching. He has tried multiple different dosing times.  Most recently, he has taken ropinirole ER 2 mg at bedtime as well as ropinirole IR 0.25 mg 1-2 times throughout the day.  He feels that this regimen has been most beneficial.  Total daily dosing of 2.5 mg.    HISTORY: (copied from my note on 04/01/2019)   Grant [name] is a 65 y.o. male here today for follow up. 3,3 2at 5, 2at 9 and 3 at bedtime . He continues to have trouble with restlessness and jerking of both legs. He is taking 3 tablets every morning, 3 at lunch, 2 around 5pm, 2 at 9pm and 3 at bedtime for a total of 13 tablets daily (3.25mg .) He has had dizziness for the past couple of years but  does not feel it is related to ropinirole. No side effects noted after taking ropinirole. Has tried Lyrica and gabapentin in the past but reports that he did not feel well on these. He reports suicidal ideations with both.     HISTORY: (copied from [employer] note on 11/27/2017)   Grant [name] is a 65 year old right-handed gentleman with an underlying medical history of hypertension, reflux disease, allergies, and overweight state, who presents for follow up consultation of his restless legs and PLMS. The patient is unaccompanied today. I last saw him on 11/19/2016, at which time he was taking ropinirole 0.25 mg strength at 3 different times. He had recently suffered serious dog bites that became  infected and needed treatment for this as an inpatient. I suggested cautious increase in his Requip 0.25 mg strength 2 pills 3 times a day. He was advised regarding augmentation.    Today, 07/15/2017 (all dictated new, as well as above notes, some dictation done in note pad or Word, outside of chart, may appear as copied):  He reports feeling about the same, maybe a little better. He takes requip 1 pill at 6 AM (2 pills was too much in AM), 1 to 2 at 4:30 and 2 at BT, which ranges from 10 PM to MN. Sometimes he has symptoms in his arms. Sx of RLS date back to several years ago. He may have tried gabapentin in the past for Back pain, not sure if he tolerated it. He would be willing to retry it.    UPDATE 9/19/2019CM  Grant [name], 65 year old male returns for follow-up with history of restless leg syndrome.  He claims that his restless legs are a little bit worse.  He thinks his Toprol is making his restless legs worse so he has stopped the medication and wants [place] to make a suggestion however in reviewing side effects of Toprol restless legs is not a side effect.  His blood pressure is elevated in the office today at 156/61.  He was encouraged to follow-up with his primary care regarding his blood pressure.  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  His legs are more bothersome today because he had to climb a ladder.  He was started back on gabapentin after his last visit in May with Dr. Rexene Alberts, however patient states once he got to 300 mg he had suicidal idealizations and he cut it back to 100 mg.  He has not had further suicidal idealzations on that dose.  He currently takes Requip 0.25mg  3 tablets twice a day.  He has a history of anemia in the past but his most recent CBC with hemoglobin of 13.5.  He returns for reevaluation    REVIEW OF SYSTEMS: Out of a complete 14 system review of symptoms, the patient complains only of the following symptoms, RLS, daytime sleepiness, and all other reviewed  systems are negative.  ESS: 16/24   ALLERGIES: Allergies  Allergen Reactions   Bee Venom Anaphylaxis, Swelling and Rash   Asa [Aspirin] Other (See Comments)    Gi upset     HOME MEDICATIONS: Outpatient Medications Prior to Visit  Medication Sig Dispense Refill   amLODipine (NORVASC) 10 MG tablet TAKE 1 TABLET DAILY 90 tablet 3   EPINEPHRINE 0.3 mg/0.3 mL IJ SOAJ injection INJECT 0.3 ML (0.3 MG) INTO THE MUSCLE ONCE FOR 1 DOSE 2 each 11   ezetimibe (ZETIA) 10 MG tablet TAKE 1 TABLET DAILY 90 tablet 3   losartan (COZAAR) 50 MG tablet  TAKE 1 TABLET DAILY 90 tablet 3   pantoprazole (PROTONIX) 40 MG tablet TAKE 1 TABLET DAILY 90 tablet 3   rOPINIRole (REQUIP) 0.25 MG tablet Take 1 tablet (0.25 mg total) by mouth in the morning and at bedtime. 180 tablet 3   Rotigotine (NEUPRO) 1 MG/24HR PT24 Place 1 patch (1 mg total) onto the skin daily at 4 PM. 90 patch 3   fluticasone (FLONASE) 50 MCG/ACT nasal spray Place 2 sprays into both nostrils daily.     ketoconazole (NIZORAL) 2 % shampoo SMARTSIG:Sparingly Topical Daily     meloxicam (MOBIC) 7.5 MG tablet      sodium chloride (OCEAN) 0.65 % SOLN nasal spray Place 1 spray into both nostrils as needed for congestion. 88 mL 0   Facility-Administered Medications Prior to Visit  Medication Dose Route Frequency Provider Last Rate Last Admin   bupivacaine (MARCAINE) 0.5 % (with pres) injection 50 mL  50 mL Infiltration Once McKenzie, Candee Furbish, MD       bupivacaine (PF) (MARCAINE) 0.25 % injection 10 mL  10 mL Infiltration Once McKenzie, Candee Furbish, MD       lidocaine (XYLOCAINE) 2 % (with pres) injection 200 mg  10 mL Other Once McKenzie, Candee Furbish, MD       lidocaine (XYLOCAINE) 2 % (with pres) injection 200 mg  10 mL Other Once Cleon Gustin, MD         PAST MEDICAL HISTORY: Past Medical History:  Diagnosis Date   Acid reflux    Allergy    Anemia    Anxiety    History of hiatal hernia    Hypertension    Insomnia    RLS (restless  legs syndrome)    Sensorineural hearing loss    Von Willebrand disease    PT STATES THIS IS MILD AND HAS NEVER HAD TO SEE HEMATOLOGIST-PT DID SAY THAT WHEN [place] HE BLED ALOT BUT IT WAS DUE TO NOT FOLLOWING POST OP INSTRUCTIONS      PAST SURGICAL HISTORY: Past Surgical History:  Procedure Laterality Date   CIRCUMCISION     AS AN ADULT   COLONOSCOPY     FOOT SURGERY Right    arch support   HERNIA REPAIR     INCISION AND DRAINAGE Right 02/04/2013   Procedure: INCISION AND DRAINAGE;  Surgeon: Linna Hoff, MD;  Location: [place];  Service: Orthopedics;  Laterality: Right;   INGUINAL HERNIA REPAIR Bilateral 05/13/2018   Procedure: LAPAROSCOPIC BILATERAL INGUINAL HERNIA REPAIR;  Surgeon: Olean Ree, MD;  Location: [facility];  Service: General;  Laterality: Bilateral;   INGUINAL HERNIA REPAIR Right 06/17/2019   Procedure: HERNIA REPAIR INGUINAL ADULT WITH MESH-- Recurrent;  Surgeon: Olean Ree, MD;  Location: [facility];  Service: General;  Laterality: Right;   OPEN REDUCTION INTERNAL FIXATION (ORIF) FINGER WITH RADIAL BONE GRAFT Right 02/04/2013   Procedure: OPEN REDUCTION INTERNAL FIXATION (ORIF) right ring and small fingers;  Surgeon: Linna Hoff, MD;  Location: [place];  Service: Orthopedics;  Laterality: Right;     FAMILY HISTORY: Family History  Problem Relation Age of Onset   Hyperlipidemia Mother    Hyperlipidemia Father    Colon cancer Neg Hx    Esophageal cancer Neg Hx    Rectal cancer Neg Hx    Stomach cancer Neg Hx      SOCIAL HISTORY: Social History   Socioeconomic History   Marital status: Married    Spouse name:  Not on file   Number of children: 4   Years of education: 12   Highest education level: Not on file  Occupational History   Occupation: Sales executive   Tobacco Use   Smoking status: Former    Packs/day: 0.50    Years: 40.00    Pack years: 20.00    Types: Cigarettes    Quit date: 05/10/2016    Years since  quitting: 4.6   Smokeless tobacco: Never  Vaping Use   Vaping Use: Former   Quit date: 05/10/2016  Substance and Sexual Activity   Alcohol use: No   Drug use: Yes    Types: Marijuana    Comment: uses daily   Sexual activity: Not on file  Other Topics Concern   Not on file  Social History Narrative   Denies caffeine use    Social Determinants of Health   Financial Resource Strain: Not on file  Food Insecurity: Not on file  Transportation Needs: Not on file  Physical Activity: Not on file  Stress: Not on file  Social Connections: Not on file  Intimate Partner Violence: Not on file      PHYSICAL EXAM  Vitals:   01/02/21 0922  BP: 121/65  Pulse: (!) 56  Weight: 172 lb (78 kg)  Height: 5\' 9"  (1.753 m)    Body mass index is 25.4 kg/m.   Generalized: Well developed, in no acute distress   Neurological examination  Mentation: Alert oriented to time, place, history taking. Follows all commands speech and language fluent Cranial nerve II-XII: Pupils were equal round reactive to light. Extraocular movements were full, visual field were full on confrontational test. Facial sensation and strength were normal. Head turning and shoulder shrug  were normal and symmetric. Motor: The motor testing reveals 5 over 5 strength of all 4 extremities. Good symmetric motor tone is noted throughout.  Sensory: Sensory testing is intact to soft touch on all 4 extremities. No evidence of extinction is noted.  Coordination: Cerebellar testing reveals good finger-nose-finger and heel-to-shin bilaterally.  Gait and station: Gait is normal.  Reflexes: Deep tendon reflexes are symmetric and normal bilaterally.     DIAGNOSTIC DATA (LABS, IMAGING, TESTING) - I reviewed patient records, labs, notes, testing and imaging myself where available.  Lab Results  Component Value Date   WBC 7.2 06/16/2020   HGB 14.3 06/16/2020   HCT 42.8 06/16/2020   MCV 90.7 06/16/2020   PLT 266 06/16/2020       Component Value Date/Time   NA 141 10/25/2019 0817   K 4.3 10/25/2019 0817   CL 105 10/25/2019 0817   CO2 29 10/25/2019 0817   GLUCOSE 154 (H) 10/25/2019 0817   BUN 14 10/25/2019 0817   CREATININE 0.96 10/25/2019 0817   CALCIUM 9.6 10/25/2019 0817   PROT 6.7 10/25/2019 0817   ALBUMIN 4.0 06/17/2016 0945   AST 17 10/25/2019 0817   ALT 14 10/25/2019 0817   ALKPHOS 92 06/17/2016 0945   BILITOT 0.8 10/25/2019 0817   GFRNONAA 83 10/25/2019 0817   GFRAA 96 10/25/2019 0817   Lab Results  Component Value Date   CHOL 168 10/25/2019   HDL 37 (L) 10/25/2019   LDLCALC 112 (H) 10/25/2019   TRIG 88 10/25/2019   CHOLHDL 4.5 10/25/2019   Lab Results  Component Value Date   HGBA1C 5.4 10/25/2019   Lab Results  Component Value Date   IDPOEUMP53 614 10/25/2019   Lab Results  Component Value Date   TSH  1.53 10/25/2019      ASSESSMENT AND PLAN  65 y.o. year old male  has a past medical history of Acid reflux, Allergy, Anemia, Anxiety, History of hiatal hernia, Hypertension, Insomnia, RLS (restless legs syndrome), Sensorineural hearing loss, and Von Willebrand disease. here with   PLMD (periodic limb movement disorder)  Derris is doing well on Neupro patch daily and ropinirole 0.25mg  BID as needed. He notes significant improvement in leg pain and movements. We will continue current treatment plan. He will continue to alternate site to prevent skin irritation. May use Flonase at site prior to application if he wishes. We have discussed concerns of daytime sleepiness. I suspect limiting caffeine and medication side effects could be contributing to worsening sleepiness. We have discussed role of stimulants and possible side effects. He is hesitant to consider at this time. He wishes to repeat sleep study to evaluate for worsening OSA. He is not sure if he snores. Sleeps with head of bed elevated. Will discuss with Dr Rexene Alberts. He was advised not to drive if he feels tired and sleepy. He was  encouraged to continue working on healthy lifestyle habits. He will follow up with me pending sleep study results. He verbalizes understanding and agreement with this plan.    Debbora Presto, MSN, FNP-C 01/02/2021, 9:28 AM  [facility] [address] [place], [place] [address] [phone]

## 2021-01-02 ENCOUNTER — Ambulatory Visit (INDEPENDENT_AMBULATORY_CARE_PROVIDER_SITE_OTHER): Payer: Medicare Other | Admitting: Family Medicine

## 2021-01-02 ENCOUNTER — Encounter: Payer: Self-pay | Admitting: Family Medicine

## 2021-01-02 VITALS — BP 121/65 | HR 56 | Ht 69.0 in | Wt 172.0 lb

## 2021-01-02 DIAGNOSIS — G4761 Periodic limb movement disorder: Secondary | ICD-10-CM | POA: Diagnosis not present

## 2021-01-02 DIAGNOSIS — G2581 Restless legs syndrome: Secondary | ICD-10-CM

## 2021-01-02 MED ORDER — NEUPRO 1 MG/24HR TD PT24: 1.0000 mg | MEDICATED_PATCH | Freq: Every day | TRANSDERMAL | 3 refills | Status: DC

## 2021-01-02 MED ORDER — ROPINIROLE HCL 0.25 MG PO TABS: 0.2500 mg | ORAL_TABLET | Freq: Two times a day (BID) | ORAL | 3 refills | Status: DC

## 2021-02-03 ENCOUNTER — Encounter: Payer: Self-pay | Admitting: Nurse Practitioner

## 2021-02-03 ENCOUNTER — Encounter: Payer: Self-pay | Admitting: Family Medicine

## 2021-02-03 ENCOUNTER — Other Ambulatory Visit: Payer: Self-pay | Admitting: Family Medicine

## 2021-02-05 ENCOUNTER — Other Ambulatory Visit: Payer: Self-pay | Admitting: Neurology

## 2021-02-05 DIAGNOSIS — G2581 Restless legs syndrome: Secondary | ICD-10-CM

## 2021-02-05 DIAGNOSIS — G4761 Periodic limb movement disorder: Secondary | ICD-10-CM

## 2021-02-05 NOTE — Telephone Encounter (Signed)
Please advise, thanks.

## 2021-02-06 ENCOUNTER — Other Ambulatory Visit: Payer: Self-pay | Admitting: Family Medicine

## 2021-02-06 NOTE — Progress Notes (Signed)
Cut neupro patch in half

## 2021-02-13 ENCOUNTER — Other Ambulatory Visit: Payer: Self-pay | Admitting: Family Medicine

## 2021-02-19 ENCOUNTER — Ambulatory Visit (INDEPENDENT_AMBULATORY_CARE_PROVIDER_SITE_OTHER): Payer: Medicare Other | Admitting: Neurology

## 2021-02-19 DIAGNOSIS — G4733 Obstructive sleep apnea (adult) (pediatric): Secondary | ICD-10-CM | POA: Diagnosis not present

## 2021-02-19 DIAGNOSIS — G2581 Restless legs syndrome: Secondary | ICD-10-CM

## 2021-02-19 DIAGNOSIS — G4761 Periodic limb movement disorder: Secondary | ICD-10-CM

## 2021-02-19 DIAGNOSIS — R001 Bradycardia, unspecified: Secondary | ICD-10-CM

## 2021-02-20 NOTE — Progress Notes (Signed)
° °  [facility]  HOME SLEEP TEST (Watch PAT) REPORT  STUDY DATE: 02/19/2021  DOB: [birth year]  MRN: [id number]  ORDERING CLINICIAN: Star Age, MD, PhD   REFERRING CLINICIAN: Debbora Presto, NP  CLINICAL INFORMATION/HISTORY: 65 year old male with underlying history of reflux disease, anemia, allergies, hypertension, hearing loss, and restless leg syndrome, who reports worsening daytime somnolence.  BMI: 25.5 kg/m  FINDINGS:   Sleep Summary:   Total Recording Time (hours, min): 6 hours, 38 minutes  Total Sleep Time (hours, min):  5 hours, 47 minutes   Percent REM (%):    21.3%   Respiratory Indices:   Calculated pAHI (per hour):  7/hour         REM pAHI:    18.3/hour       NREM pAHI: 4/hour  Oxygen Saturation Statistics:    Oxygen Saturation (%) Mean: 93%   Minimum oxygen saturation (%):                 81%   O2 Saturation Range (%): 81-99%    O2 Saturation (minutes) <=88%: 0.7 min  Pulse Rate Statistics:   Pulse Mean (bpm):    47/min    Pulse Range (35-97/min)   IMPRESSION: OSA (obstructive sleep apnea), mild Bradycardia  RECOMMENDATION:  This home sleep test demonstrates overall mild obstructive sleep apnea with a total AHI of 7/hour and O2 nadir of 81%.  Bradycardia was noted.  The patient has a history of bradycardia.  Snoring was noted intermittently, ranging from mild to loud. Given the patient's medical history and sleep related complaints, treatment with positive airway pressure is recommended. This can be achieved in the form of autoPAP trial/titration at home. A full night CPAP titration study may help with proper treatment settings and mask fitting if needed, down the road. Alternative treatments include weight loss along with avoidance of the supine sleep position, or an oral appliance in appropriate candidates.   Please note that untreated obstructive sleep apnea may carry additional perioperative morbidity. Patients with significant  obstructive sleep apnea should receive perioperative PAP therapy and the surgeons and particularly the anesthesiologist should be informed of the diagnosis and the severity of the sleep disordered breathing. The patient should be cautioned not to drive, work at heights, or operate dangerous or heavy equipment when tired or sleepy. Review and reiteration of good sleep hygiene measures should be pursued with any patient. Other causes of the patient's symptoms, including circadian rhythm disturbances, an underlying mood disorder, medication effect and/or an underlying medical problem cannot be ruled out based on this test. Clinical correlation is recommended.   The patient and his referring provider will be notified of the test results. The patient will be seen in follow up in sleep clinic at [facility].  I certify that I have reviewed the raw data recording prior to the issuance of this report in accordance with the standards of the American Academy of Sleep Medicine (AASM).  INTERPRETING PHYSICIAN:   Star Age, MD, PhD  Board Certified in Neurology and Sleep Medicine  [facility] [address], [place], [place] [address] [phone]

## 2021-02-21 NOTE — Procedures (Signed)
° °  [facility]  HOME SLEEP TEST (Watch PAT) REPORT  STUDY DATE: 02/19/2021  DOB: [birth year]  MRN: [id number]  ORDERING CLINICIAN: Star Age, MD, PhD   REFERRING CLINICIAN: Debbora Presto, NP  CLINICAL INFORMATION/HISTORY: 65 year old male with underlying history of reflux disease, anemia, allergies, hypertension, hearing loss, and restless leg syndrome, who reports worsening daytime somnolence.  BMI: 25.5 kg/m  FINDINGS:   Sleep Summary:   Total Recording Time (hours, min): 6 hours, 38 minutes  Total Sleep Time (hours, min):  5 hours, 47 minutes   Percent REM (%):    21.3%   Respiratory Indices:   Calculated pAHI (per hour):  7/hour         REM pAHI:    18.3/hour       NREM pAHI: 4/hour  Oxygen Saturation Statistics:    Oxygen Saturation (%) Mean: 93%   Minimum oxygen saturation (%):                 81%   O2 Saturation Range (%): 81-99%    O2 Saturation (minutes) <=88%: 0.7 min  Pulse Rate Statistics:   Pulse Mean (bpm):    47/min    Pulse Range (35-97/min)   IMPRESSION: OSA (obstructive sleep apnea), mild Bradycardia  RECOMMENDATION:  This home sleep test demonstrates overall mild obstructive sleep apnea with a total AHI of 7/hour and O2 nadir of 81%.  Bradycardia was noted.  The patient has a history of bradycardia.  Snoring was noted intermittently, ranging from mild to loud. Given the patient's medical history and sleep related complaints, treatment with positive airway pressure is recommended. This can be achieved in the form of autoPAP trial/titration at home. A full night CPAP titration study may help with proper treatment settings and mask fitting if needed, down the road. Alternative treatments include weight loss along with avoidance of the supine sleep position, or an oral appliance in appropriate candidates.   Please note that untreated obstructive sleep apnea may carry additional perioperative morbidity. Patients with significant  obstructive sleep apnea should receive perioperative PAP therapy and the surgeons and particularly the anesthesiologist should be informed of the diagnosis and the severity of the sleep disordered breathing. The patient should be cautioned not to drive, work at heights, or operate dangerous or heavy equipment when tired or sleepy. Review and reiteration of good sleep hygiene measures should be pursued with any patient. Other causes of the patient's symptoms, including circadian rhythm disturbances, an underlying mood disorder, medication effect and/or an underlying medical problem cannot be ruled out based on this test. Clinical correlation is recommended.   The patient and his referring provider will be notified of the test results. The patient will be seen in follow up in sleep clinic at [facility].  I certify that I have reviewed the raw data recording prior to the issuance of this report in accordance with the standards of the American Academy of Sleep Medicine (AASM).  INTERPRETING PHYSICIAN:   Star Age, MD, PhD  Board Certified in Neurology and Sleep Medicine  [facility] [address], [place],  [address] [phone]

## 2021-02-21 NOTE — Addendum Note (Signed)
Addended by: Star Age on: 02/21/2021 06:02 PM   Modules accepted: Orders

## 2021-02-22 ENCOUNTER — Telehealth: Payer: Self-pay | Admitting: *Deleted

## 2021-02-22 ENCOUNTER — Encounter: Payer: Self-pay | Admitting: *Deleted

## 2021-02-22 NOTE — Telephone Encounter (Addendum)
I spoke with the patient and we discussed his home sleep test results.  Patient is amenable to AutoPap therapy for his mild obstructive sleep apnea.  Patient aware we will send the order to [facility] provided they take his insurance.  Patient aware of insurance compliance requirements which includes using the machine at least 4 hours every night and being seen in the sleep clinic between 30 and 90 days after set up.  I scheduled him for Monday May 21, 2021 at 7:45 AM arrival 15-30 minutes early with his machine & power cord. Pt preferred mychart for follow-up letter.   I confirmed with [facility] that they take pt's insurance. Autopap order, last office note, insurance and demographic info, and sleep study results faxed to [facility]. Received a receipt of confirmation.   ----- Message from Star Age, MD sent at 02/21/2021  6:02 PM EST ----- Patient was last seen by Debbora Presto, NP on 01/02/2021 for his RLS, at which time he was complaining of daytime somnolence.  He had a home sleep test on 02/19/2021 which indicated mild obstructive sleep apnea.  I would like for him to consider starting AutoPap therapy.  I will place an order in his chart.  The AutoPap machine comes through a DME company. The DME representative will educate him on how to use the machine, how to put the mask on, etc. I have placed an order in the chart. Please send referral, talk to patient, send report to referring MD. We will need a FU in sleep clinic for 10 weeks post-PAP set up, please arrange that with me or one of our NPs. Thanks,   Star Age, MD, PhD [facility])

## 2021-03-11 ENCOUNTER — Encounter: Payer: Self-pay | Admitting: Family Medicine

## 2021-03-11 ENCOUNTER — Encounter: Payer: Self-pay | Admitting: Nurse Practitioner

## 2021-03-26 ENCOUNTER — Encounter: Payer: Self-pay | Admitting: Family Medicine

## 2021-03-26 ENCOUNTER — Other Ambulatory Visit: Payer: Self-pay

## 2021-03-26 ENCOUNTER — Encounter: Payer: Self-pay | Admitting: Nurse Practitioner

## 2021-03-26 ENCOUNTER — Ambulatory Visit (INDEPENDENT_AMBULATORY_CARE_PROVIDER_SITE_OTHER): Payer: Medicare Other | Admitting: Nurse Practitioner

## 2021-03-26 VITALS — BP 112/76 | HR 63 | Ht 69.0 in | Wt 169.0 lb

## 2021-03-26 DIAGNOSIS — I10 Essential (primary) hypertension: Secondary | ICD-10-CM | POA: Diagnosis not present

## 2021-03-26 DIAGNOSIS — I700 Atherosclerosis of aorta: Secondary | ICD-10-CM | POA: Diagnosis not present

## 2021-03-26 DIAGNOSIS — R002 Palpitations: Secondary | ICD-10-CM | POA: Diagnosis not present

## 2021-03-26 DIAGNOSIS — R42 Dizziness and giddiness: Secondary | ICD-10-CM

## 2021-03-26 LAB — COMPLETE METABOLIC PANEL WITH GFR
AG Ratio: 1.8 (calc) (ref 1.0–2.5)
ALT: 17 U/L (ref 9–46)
AST: 14 U/L (ref 10–35)
Albumin: 4.3 g/dL (ref 3.6–5.1)
Alkaline phosphatase (APISO): 95 U/L (ref 35–144)
BUN: 15 mg/dL (ref 7–25)
CO2: 34 mmol/L — ABNORMAL HIGH (ref 20–32)
Calcium: 9.4 mg/dL (ref 8.6–10.3)
Chloride: 103 mmol/L (ref 98–110)
Creat: 0.88 mg/dL (ref 0.70–1.35)
Globulin: 2.4 g/dL (calc) (ref 1.9–3.7)
Glucose, Bld: 56 mg/dL — ABNORMAL LOW (ref 65–99)
Potassium: 4.1 mmol/L (ref 3.5–5.3)
Sodium: 141 mmol/L (ref 135–146)
Total Bilirubin: 0.5 mg/dL (ref 0.2–1.2)
Total Protein: 6.7 g/dL (ref 6.1–8.1)
eGFR: 95 mL/min/{1.73_m2} (ref >=60)

## 2021-03-26 LAB — TSH: TSH: 1.58 mIU/L (ref 0.40–4.50)

## 2021-03-26 MED ORDER — AMLODIPINE BESYLATE 5 MG PO TABS: 10.0000 mg | ORAL_TABLET | Freq: Every day | ORAL | 1 refills | Status: DC

## 2021-03-26 MED ORDER — AMLODIPINE BESYLATE 5 MG PO TABS: 5.0000 mg | ORAL_TABLET | Freq: Every day | ORAL | 1 refills | Status: DC

## 2021-03-26 MED ORDER — LOSARTAN POTASSIUM 50 MG PO TABS: 50.0000 mg | ORAL_TABLET | Freq: Every day | ORAL | 1 refills | Status: DC

## 2021-03-26 NOTE — Progress Notes (Signed)
Subjective:    Patient ID: Grant [name], male    DOB: 03-01-1956, 66 y.o.   MRN: [id number]  HPI: Grant [name] is a 66 y.o. male presenting for follow up.  Chief Complaint  Patient presents with   Hyperlipidemia   HYPERTENSION / HYPERLIPIDEMIA Currently taking amlodipine 10 mg daily, losartan 50 mg daily.  Has not been able to tolerate atorvastatin 10 mg daily or Zetia 5 mg daily.  He even cut back to a few days per week with both these medications without improvement and side effects including joint and muscle pain.  Per chest CT April 2022-also has aortic atherosclerosis.  He does worry about plaque buildup in arteries and not being tolerant of medications like statins and Zetia.  He would like referral to advanced lipid clinic today. BP monitoring frequency: not checking Aspirin: no Recent stressors: no Recurrent headaches: no Visual changes: no Palpitations: no Dyspnea: no Chest pain: no Lower extremity edema: no Dizzy/lightheaded: yes; with position changes Myalgias:  Better now off medication LDL goal: Less than 100 BP goal: Less than 130/80 The 10-year ASCVD risk score (Arnett DK, et al., 2019) is: 13.4%   Values used to calculate the score:     Age: 21 years     Sex: Male     Is Non-Hispanic African American: No     Diabetic: No     Tobacco smoker: No     Systolic Blood Pressure: 540 mmHg     Is BP treated: Yes     HDL Cholesterol: 37 mg/dL     Total Cholesterol: 168 mg/dL   PALPITATIONS Patient reports he feels like his heart is racing in the office today.  He has noticed this more frequently the past few months.  Thinks it may be associated with stress.  Reports on the way here, somebody was tailgating him and this did make him feel stressed/anxious.  Day-to-day, he does not feel stressed/anxious. Duration: days Symptom description: heart racing Duration of episode: varies; 10 minutes to 30 minutes Frequency: multiple times per week; feels more with  anxiety Activity when event occurred: moving around Related to exertion: no Dyspnea: yes Chest pain: no Syncope: no Anxiety/stress: yes Nausea/vomiting: no Diaphoresis: no Coronary artery disease: yes Congestive heart failure: no Arrhythmia:no Thyroid disease: no Caffeine intake: 3 sodas per week; does not drink coffee Status:  stable Treatments attempted: try calming self down  Has been having some dizziness; mostly with position changes; drinks 3-4 bottles of water daily.  Does manual labor for work.  Denies passing out, syncope, vision changes, room spinning.  Reports CPAP gives him gas.  Wondering if this is normal.   Allergies  Allergen Reactions   Bee Venom Anaphylaxis, Swelling and Rash   Asa [Aspirin] Other (See Comments)    Gi upset   Atorvastatin Other (See Comments)    Joint pain   Zetia [Ezetimibe] Other (See Comments)    Joint pain    Outpatient Encounter Medications as of 03/26/2021  Medication Sig   EPINEPHRINE 0.3 mg/0.3 mL IJ SOAJ injection INJECT 0.3 ML (0.3 MG) INTO THE MUSCLE ONCE FOR 1 DOSE   pantoprazole (PROTONIX) 40 MG tablet TAKE 1 TABLET DAILY   rOPINIRole (REQUIP) 0.25 MG tablet Take 1 tablet (0.25 mg total) by mouth in the morning and at bedtime.   Rotigotine (NEUPRO) 1 MG/24HR PT24 Place 1 patch (1 mg total) onto the skin daily at 4 PM.   [DISCONTINUED] amLODipine (NORVASC) 10 MG tablet  TAKE 1 TABLET DAILY   [DISCONTINUED] ezetimibe (ZETIA) 10 MG tablet TAKE 1 TABLET DAILY   [DISCONTINUED] losartan (COZAAR) 50 MG tablet TAKE 1 TABLET DAILY   amLODipine (NORVASC) 5 MG tablet Take 1 tablet (5 mg total) by mouth daily.   losartan (COZAAR) 50 MG tablet Take 1 tablet (50 mg total) by mouth daily.   [DISCONTINUED] amLODipine (NORVASC) 5 MG tablet Take 2 tablets (10 mg total) by mouth daily.   [DISCONTINUED] bupivacaine (MARCAINE) 0.5 % (with pres) injection 50 mL    [DISCONTINUED] bupivacaine (PF) (MARCAINE) 0.25 % injection 10 mL    [DISCONTINUED]  lidocaine (XYLOCAINE) 2 % (with pres) injection 200 mg    [DISCONTINUED] lidocaine (XYLOCAINE) 2 % (with pres) injection 200 mg    No facility-administered encounter medications on file as of 03/26/2021.    Patient Active Problem List   Diagnosis Date Noted   Primary hypertension 03/26/2021   Palpitations 03/26/2021   Pain in testicle 10/20/2020   Von Willebrand disease 08/31/2020   Aortic atherosclerosis ([place]) 06/27/2020   Centrilobular emphysema ([place]) 06/27/2020   Lateral epicondylitis, left elbow 05/31/2020   Recurrent inguinal hernia of right side without obstruction or gangrene    Bilateral inguinal hernia 05/08/2018   Cubital tunnel syndrome on left 04/09/2018   Sensorineural hearing loss    Restless leg syndrome 11/27/2017   Flat feet, bilateral 04/03/2017   Insomnia     Past Medical History:  Diagnosis Date   Acid reflux    Allergy    Anemia    Anxiety    History of hiatal hernia    Hypertension    Insomnia    RLS (restless legs syndrome)    Sensorineural hearing loss    Von Willebrand disease    PT STATES THIS IS MILD AND HAS NEVER HAD TO SEE HEMATOLOGIST-PT DID SAY THAT WHEN HE WAS [place] IN [place] YE[place] A[place] HE BLED ALOT BUT IT WAS DUE TO NOT FOLLOWING POST OP INSTRUCTIONS     Relevant past medical, surgical, family and social history reviewed and updated as indicated. Interim medical history since our last visit reviewed.  Review of Systems Per HPI unless specifically indicated above     Objective:    BP 112/76    Pulse 63    Ht 5\' 9"  (1.753 m)    Wt 169 lb (76.7 kg)    SpO2 97%    BMI 24.96 kg/m   Wt Readings from Last 3 Encounters:  03/26/21 169 lb (76.7 kg)  01/02/21 172 lb (78 kg)  10/20/20 169 lb 9.6 oz (76.9 kg)    Physical Exam Vitals and nursing note reviewed.  Constitutional:      General: He is not in acute distress.    Appearance: Normal appearance. He is obese. He is not toxic-appearing.  Eyes:     General: No scleral icterus.     Extraocular Movements: Extraocular movements intact.  Neck:     Vascular: No carotid bruit.  Cardiovascular:     Rate and Rhythm: Normal rate and regular rhythm.     Heart sounds: Normal heart sounds. No murmur heard. Pulmonary:     Effort: Pulmonary effort is normal. No respiratory distress.     Breath sounds: Normal breath sounds. No wheezing or rhonchi.  Musculoskeletal:     Cervical back: Normal range of motion.  Skin:    General: Skin is warm and dry.     Coloration: Skin is not jaundiced or pale.  Findings: No erythema.  Neurological:     Mental Status: He is alert and oriented to person, place, and time.     Motor: No weakness.     Gait: Gait normal.  Psychiatric:        Mood and Affect: Mood normal.        Behavior: Behavior normal.        Thought Content: Thought content normal.        Judgment: Judgment normal.      Assessment & Plan:   Problem List Items Addressed This Visit       Cardiovascular and Mediastinum   Primary hypertension    Chronic.  Stable with amlodipine 10 mg daily and losartan 50 mg daily.  Given dizziness, I suspect blood pressure may be overtreated.  Will decrease amlodipine to 5 mg daily and continue losartan 50 mg daily.  Patient to check BP at home and bring a log to next appointment with new PCP.  Check kidney function with electrolytes today.       Relevant Medications   losartan (COZAAR) 50 MG tablet   amLODipine (NORVASC) 5 MG tablet   Other Relevant Orders   COMPLETE METABOLIC PANEL WITH GFR   Aortic atherosclerosis ([facility]) - Primary    Chronic.  Patient has not been able to tolerate atorvastatin or Zetia.  He is worried about cardiovascular risk and is interested in injectable like Repatha.  Referral to lipid clinic placed.      Relevant Medications   losartan (COZAAR) 50 MG tablet   amLODipine (NORVASC) 5 MG tablet   Other Relevant Orders   AMB Referral to [facility]   Ambulatory referral to Cardiology      Other   Palpitations    Acute, worsening.  EKG today normal - actually showed sinus bradycardia.  Reassurance given to patient.  Suspect stress/anxiety may be a component to perception of elevated heart rate.  Check electrolytes with kidney function and TSH.  Referral placed to Cardiology per patient request.      Relevant Orders   TSH   EKG 12-Lead (Completed)   Ambulatory referral to Cardiology   Other Visit Diagnoses     Dizziness       Suspect orthostatic hypotension, decrease amlodipine to 5 mg daily and monitor BP at home   Relevant Orders   TSH   EKG 12-Lead (Completed)   Ambulatory referral to Cardiology        Follow up plan: Return for with new PCP as planned.

## 2021-03-26 NOTE — Assessment & Plan Note (Signed)
Chronic.  Patient has not been able to tolerate atorvastatin or Zetia.  He is worried about cardiovascular risk and is interested in injectable like Repatha.  Referral to lipid clinic placed.

## 2021-03-26 NOTE — Assessment & Plan Note (Signed)
Acute, worsening.  EKG today normal - actually showed sinus bradycardia.  Reassurance given to patient.  Suspect stress/anxiety may be a component to perception of elevated heart rate.  Check electrolytes with kidney function and TSH.  Referral placed to Cardiology per patient request.

## 2021-03-26 NOTE — Assessment & Plan Note (Signed)
Chronic.  Stable with amlodipine 10 mg daily and losartan 50 mg daily.  Given dizziness, I suspect blood pressure may be overtreated.  Will decrease amlodipine to 5 mg daily and continue losartan 50 mg daily.  Patient to check BP at home and bring a log to next appointment with new PCP.  Check kidney function with electrolytes today.

## 2021-03-27 ENCOUNTER — Encounter: Payer: Self-pay | Admitting: Nurse Practitioner

## 2021-03-28 NOTE — Progress Notes (Signed)
Cardiology Office Note   Date:  03/29/2021   ID:  Grant [name], Grant [name] 09/25/1955, MRN [id number]  PCP:  Eulogio Bear, NP  Cardiologist:   Kathlyn Sacramento, MD   Chief Complaint  Patient presents with   Other    Atherosclerosis/dizziness and sob no complaints today. Meds reviewed verbally with pt.      History of Present Illness: Grant [name] is a 66 y.o. male who was referred by Noemi Chapel for evaluation of chronic calcifications and hyperlipidemia.  He has known history of essential hypertension, hyperlipidemia with intolerance to statins, previous tobacco use, COPD, GERD and sleep apnea on CPAP.  He had CT scan of the lungs in April of last year which was personally reviewed by me.  It showed evidence of three-vessel coronary artery calcifications as well as aortic calcifications. The patient had a recent episode of substernal chest pain described as burning sensation and tightness that was associated with dizziness lasted for about 45 minutes.  Since then, he had no recurrent symptoms but has noticed increased exertional dyspnea. He quit smoking 10 years ago.  He was tried on atorvastatin and Zetia but did not tolerate both of these medications due to joint pain. CT scan of the abdomen in 2020 showed no evidence of abdominal aortic aneurysm.   Past Medical History:  Diagnosis Date   Acid reflux    Allergy    Anemia    Anxiety    History of hiatal hernia    Hypertension    Insomnia    RLS (restless legs syndrome)    Sensorineural hearing loss    Von Willebrand disease    PT STATES THIS IS MILD AND HAS NEVER HAD TO SEE HEMATOLOGIST-PT DID SAY THAT WHEN [place] HE BLED ALOT BUT IT WAS DUE TO NOT FOLLOWING POST OP INSTRUCTIONS     Past Surgical History:  Procedure Laterality Date   CIRCUMCISION     AS AN ADULT   COLONOSCOPY     FOOT SURGERY Right    arch support   HERNIA REPAIR     INCISION AND DRAINAGE Right 02/04/2013    Procedure: INCISION AND DRAINAGE;  Surgeon: Linna Hoff, MD;  Location: [facility];  Service: Orthopedics;  Laterality: Right;   INGUINAL HERNIA REPAIR Bilateral 05/13/2018   Procedure: LAPAROSCOPIC BILATERAL INGUINAL HERNIA REPAIR;  Surgeon: Olean Ree, MD;  Location: [facility];  Service: General;  Laterality: Bilateral;   INGUINAL HERNIA REPAIR Right 06/17/2019   Procedure: HERNIA REPAIR INGUINAL ADULT WITH MESH-- Recurrent;  Surgeon: Olean Ree, MD;  Location: [facility];  Service: General;  Laterality: Right;   OPEN REDUCTION INTERNAL FIXATION (ORIF) FINGER WITH RADIAL BONE GRAFT Right 02/04/2013   Procedure: OPEN REDUCTION INTERNAL FIXATION (ORIF) right ring and small fingers;  Surgeon: Linna Hoff, MD;  Location: [facility];  Service: Orthopedics;  Laterality: Right;     Current Outpatient Medications  Medication Sig Dispense Refill   amLODipine (NORVASC) 5 MG tablet Take 1 tablet (5 mg total) by mouth daily. 90 tablet 1   EPINEPHRINE 0.3 mg/0.3 mL IJ SOAJ injection INJECT 0.3 ML (0.3 MG) INTO THE MUSCLE ONCE FOR 1 DOSE 2 each 11   losartan (COZAAR) 50 MG tablet Take 1 tablet (50 mg total) by mouth daily. 90 tablet 1   metoprolol tartrate (LOPRESSOR) 25 MG tablet Take 1 tablet (25 mg) 2 hours prior to Cardiac CTA 1 tablet 0   pantoprazole (  PROTONIX) 40 MG tablet TAKE 1 TABLET DAILY 90 tablet 3   rOPINIRole (REQUIP) 0.25 MG tablet Take 1 tablet (0.25 mg total) by mouth in the morning and at bedtime. 180 tablet 3   Rotigotine (NEUPRO) 1 MG/24HR PT24 Place 1 patch (1 mg total) onto the skin daily at 4 PM. 90 patch 3   No current facility-administered medications for this visit.    Allergies:   Bee venom, Asa [aspirin], Atorvastatin, and Zetia [ezetimibe]    Social History:  The patient  reports that he quit smoking about 4 years ago. His smoking use included cigarettes. He has a 20.00 pack-year smoking history. He has never used smokeless tobacco. He reports current drug use. Drug: Marijuana.  He reports that he does not drink alcohol.   Family History:  The patient's family history includes Hyperlipidemia in his father and mother; Hypertension in his father and mother; Stroke in his father.    ROS:  Please see the history of present illness.   Otherwise, review of systems are positive for none.   All other systems are reviewed and negative.    PHYSICAL EXAM: VS:  BP 128/74 (BP Location: Right Arm, Patient Position: Sitting, Cuff Size: Normal)    Pulse 62    Ht 5\' 9"  (1.753 m)    Wt 173 lb (78.5 kg)    SpO2 98%    BMI 25.55 kg/m  , BMI Body mass index is 25.55 kg/m. GEN: Well nourished, well developed, in no acute distress  HEENT: normal  Neck: no JVD,  or masses.  Right carotid bruit. Cardiac: RRR; no murmurs, rubs, or gallops,no edema  Respiratory:  clear to auscultation bilaterally, normal work of breathing GI: soft, nontender, nondistended, + BS MS: no deformity or atrophy  Skin: warm and dry, no rash Neuro:  Strength and sensation are intact Psych: euthymic mood, full affect Vascular: Femoral pulses +2 bilaterally.  Posterior tibialis +2 bilaterally.  Dorsalis pedis is diminished on both sides.   EKG:  EKG is notordered today. I personally viewed his recent EKG done on the 16th which showed sinus bradycardia with no significant ST or T wave changes.   Recent Labs: 06/16/2020: Hemoglobin 14.3; Platelets 266 03/26/2021: ALT 17; BUN 15; Creat 0.88; Potassium 4.1; Sodium 141; TSH 1.58    Lipid Panel    Component Value Date/Time   CHOL 168 10/25/2019 0817   TRIG 88 10/25/2019 0817   HDL 37 (L) 10/25/2019 0817   CHOLHDL 4.5 10/25/2019 0817   VLDL 10 06/17/2016 0945   LDLCALC 112 (H) 10/25/2019 0817      Wt Readings from Last 3 Encounters:  03/29/21 173 lb (78.5 kg)  03/26/21 169 lb (76.7 kg)  01/02/21 172 lb (78 kg)      No flowsheet data found.    ASSESSMENT AND PLAN:  1.  Chest pain and exertional dyspnea worrisome for new onset angina: Multiple  risk factors for coronary artery disease with known history of coronary calcifications and atherosclerosis on CT scan done last year.  Given these new symptoms, recommend evaluation with cardiac CTA and FFR if needed.  2.  Right carotid bruit: No previous evaluation.  I requested carotid Doppler.  3.  Aortic atherosclerosis, the patient does complain of leg pain that is not consistent with claudication.  His pulses are palpable throughout and I doubt significant underlying peripheral arterial disease.  In addition, the patient did have CT imaging in 2020 that showed no evidence of abdominal aortic aneurysm.  4.  Hyperlipidemia: The patient will require treatment of some form.  So far he did not tolerate atorvastatin or Zetia due to joint pain.  I requested a follow-up lipid profile to guide treatment.  I might consider small dose rosuvastatin.  If he is not able to tolerate that, the neck step would be a PCSK9 inhibitor.    Disposition:   FU with me in 1 month  Signed,  Kathlyn Sacramento, MD  03/29/2021 11:42 AM    [place]

## 2021-03-29 ENCOUNTER — Ambulatory Visit (INDEPENDENT_AMBULATORY_CARE_PROVIDER_SITE_OTHER): Payer: Medicare Other | Admitting: Cardiovascular Disease

## 2021-03-29 ENCOUNTER — Encounter: Payer: Self-pay | Admitting: Cardiovascular Disease

## 2021-03-29 ENCOUNTER — Other Ambulatory Visit: Payer: Self-pay

## 2021-03-29 VITALS — BP 128/74 | HR 62 | Ht 69.0 in | Wt 173.0 lb

## 2021-03-29 DIAGNOSIS — I700 Atherosclerosis of aorta: Secondary | ICD-10-CM | POA: Diagnosis not present

## 2021-03-29 DIAGNOSIS — E785 Hyperlipidemia, unspecified: Secondary | ICD-10-CM | POA: Diagnosis not present

## 2021-03-29 DIAGNOSIS — R072 Precordial pain: Secondary | ICD-10-CM | POA: Diagnosis not present

## 2021-03-29 DIAGNOSIS — R0989 Other specified symptoms and signs involving the circulatory and respiratory systems: Secondary | ICD-10-CM

## 2021-03-29 MED ORDER — METOPROLOL TARTRATE 25 MG PO TABS: ORAL_TABLET | ORAL | 0 refills | Status: DC

## 2021-03-29 NOTE — Patient Instructions (Signed)
Medication Instructions:  Your physician recommends that you continue on your current medications as directed. Please refer to the Current Medication list given to you today.  A one time dose of Metoprolol 25 mg to be taken 2 hours prior to your Cardiac CTA has been sent to [facility]  *If you need a refill on your cardiac medications before your next appointment, please call your pharmacy*   Lab Work: Your physician recommends that you return for a FASTING lipid profile   If you have labs (blood work) drawn today and your tests are completely normal, you will receive your results only by: [place] (if you have MyChart) OR A paper copy in the mail If you have any lab test that is abnormal or we need to change your treatment, we will call you to review the results.   Testing/Procedures: Your physician has requested that you have a carotid duplex. This test is an ultrasound of the carotid arteries in your neck. It looks at blood flow through these arteries that supply the brain with blood. Allow one hour for this exam. There are no restrictions or special instructions.  Your physician has requested that you have cardiac CT. Cardiac computed tomography (CT) is a painless test that uses an x-ray machine to take clear, detailed pictures of your heart. For further information please visit [email or web address].[email or web address]. Please follow instruction sheet as given.     Follow-Up: At [facility], you and your health needs are our priority.  As part of our continuing mission to provide you with exceptional heart care, we have created designated Provider Care Teams.  These Care Teams include your primary Cardiologist (physician) and Advanced Practice Providers (APPs -  Physician Assistants and Nurse Practitioners) who all work together to provide you with the care you need, when you need it.  We recommend signing up for the patient portal called "MyChart".  Sign up information is provided on this  After Visit Summary.  MyChart is used to connect with patients for Virtual Visits (Telemedicine).  Patients are able to view lab/test results, encounter notes, upcoming appointments, etc.  Non-urgent messages can be sent to your provider as well.   To learn more about what you can do with MyChart, go to [email or web address].    Your next appointment:   After test  The format for your next appointment:   In Person  Provider:   You may see Kathlyn Sacramento, MD or one of the following Advanced Practice Providers on your designated Care Team:   Murray Hodgkins, NP Christell Faith, PA-C Cadence Kathlen Mody, PA-C{    Other Instructions   Your cardiac CT will be scheduled at one of the below locations:   [facility] [address] [place], [place] [address] [phone]  [facility] [address] [place], [place] [address] [phone]  If scheduled at [facility], please arrive at the [facility] main entrance (entrance A) of [facility] 30 minutes prior to test start time. You can use the FREE valet parking offered at the main entrance (encouraged to control the heart rate for the test) Proceed to the [facility] Radiology Department (first floor) to check-in and test prep.  If scheduled at [facility], please arrive 15 mins early for check-in and test prep.  Please follow these instructions carefully (unless otherwise directed):  Hold all erectile dysfunction medications at least 3 days (72 hrs) prior to test.  On  the Night Before the Test: Be sure to Drink plenty of water. Do not consume any caffeinated/decaffeinated beverages or chocolate 12 hours prior to your test.   On the Day of the Test: Drink plenty of water until 1 hour prior to the test. Do not eat any food 4 hours prior to the test. You may take your regular medications prior to the test.  Take metoprolol  (Lopressor) two hours prior to test.        After the Test: Drink plenty of water. After receiving IV contrast, you may experience a mild flushed feeling. This is normal. On occasion, you may experience a mild rash up to 24 hours after the test. This is not dangerous. If this occurs, you can take Benadryl 25 mg and increase your fluid intake. If you experience trouble breathing, this can be serious. If it is severe call 911 IMMEDIATELY. If it is mild, please call our office. If you take any of these medications: Glipizide/Metformin, Avandament, Glucavance, please do not take 48 hours after completing test unless otherwise instructed.  We will call to schedule your test 2-4 weeks out understanding that some insurance companies will need an authorization prior to the service being performed.   For non-scheduling related questions, please contact the cardiac imaging nurse navigator should you have any questions/concerns: Marchia Bond, Cardiac Imaging Nurse Navigator Gordy Clement, Cardiac Imaging Nurse Navigator [facility] Direct Office Dial: [phone]   For scheduling needs, including cancellations and rescheduling, please call [place], [phone].

## 2021-04-05 ENCOUNTER — Encounter: Payer: Self-pay | Admitting: Nurse Practitioner

## 2021-04-13 ENCOUNTER — Telehealth (HOSPITAL_COMMUNITY): Payer: Self-pay | Admitting: *Deleted

## 2021-04-13 NOTE — Telephone Encounter (Signed)
Reaching out to patient to offer assistance regarding upcoming cardiac imaging study; pt verbalizes understanding of appt date/time, parking situation and where to check in, pre-test NPO status and medications ordered; name and call back number provided for further questions should they arise  Gordy Clement RN Navigator Cardiac Imaging [facility] [phone] office [phone] cell  Patient to take 25mg  metoprolol tartrate two hours prior to his cardiac CT scan.

## 2021-04-16 ENCOUNTER — Other Ambulatory Visit: Payer: Self-pay | Admitting: Cardiovascular Disease

## 2021-04-16 ENCOUNTER — Ambulatory Visit
Admission: RE | Admit: 2021-04-16 | Discharge: 2021-04-16 | Disposition: A | Discharge status: Home / Self Care | Payer: Medicare Other | Source: Ambulatory Visit | Attending: Cardiovascular Disease | Admitting: Cardiovascular Disease

## 2021-04-16 ENCOUNTER — Other Ambulatory Visit: Payer: Self-pay

## 2021-04-16 DIAGNOSIS — R072 Precordial pain: Secondary | ICD-10-CM | POA: Insufficient documentation

## 2021-04-16 DIAGNOSIS — R931 Abnormal findings on diagnostic imaging of heart and coronary circulation: Secondary | ICD-10-CM

## 2021-04-16 DIAGNOSIS — I2510 Atherosclerotic heart disease of native coronary artery without angina pectoris: Secondary | ICD-10-CM | POA: Diagnosis not present

## 2021-04-16 MED ORDER — NITROGLYCERIN 0.4 MG SL SUBL
0.8000 mg | SUBLINGUAL_TABLET | Freq: Once | SUBLINGUAL | Status: AC
Start: 2021-04-16 — End: 2021-04-16
  Administered 2021-04-16: 0.8 mg via SUBLINGUAL

## 2021-04-16 MED ORDER — IOHEXOL 350 MG/ML SOLN
75.0000 mL | Freq: Once | INTRAVENOUS | Status: AC | PRN
  Administered 2021-04-16: 75 mL via INTRAVENOUS

## 2021-04-16 NOTE — Progress Notes (Signed)
Patient tolerated procedure well. Ambulate w/o difficulty. Denies light headedness or being dizzy. Sitting in chair, refused water at this time. Encouraged to drink extra water today and reasoning explained. Verbalized understanding. All questions answered. ABC intact. No further needs. Discharge from procedure area w/o issues.

## 2021-04-17 DIAGNOSIS — R931 Abnormal findings on diagnostic imaging of heart and coronary circulation: Secondary | ICD-10-CM | POA: Diagnosis not present

## 2021-04-20 ENCOUNTER — Telehealth: Payer: Self-pay

## 2021-04-20 NOTE — Telephone Encounter (Signed)
Okay he can hold off on aspirin for now. I am aware of his previous history with atorvastatin and Zetia but many patients can tolerate other statins.  If he does not want to try rosuvastatin then I recommend Repatha 140 mg subcu every 2 weeks

## 2021-04-20 NOTE — Telephone Encounter (Signed)
-----   Message from Wellington Hampshire, MD sent at 04/20/2021  7:54 AM EST ----- Inform patient that cardiac CTA showed mild to moderate coronary artery disease which does not seem to be flow-limiting and should not be causing symptoms but we do have to treat his risk factors to prevent progression. Recommend adding rosuvastatin 10 mg daily and aspirin 81 mg daily.  Keep follow-up as planned.

## 2021-04-20 NOTE — Telephone Encounter (Signed)
Patient made aware of Cardiac CTA results and Dr. Tyrell Antonio recommendation.  Patient sts that he is intolerant to statins as listed on his allergy list. He has attempted Atorvastatin and Zetia in the past which caused him severe joint pain.  Patient also sts that he has a hx of of GI bleed after surgery and was told that he should never take Aspirin which he says is also on his allergy list.  Adv the patient that I will fwd his concerns to Dr. Fletcher Anon and call him back if Dr. Fletcher Anon has additional recommendations prior to his 04/27/21 f/u appt.  Patient agreeable with the plan and voiced appreciation for the call.

## 2021-04-24 ENCOUNTER — Other Ambulatory Visit: Payer: Self-pay

## 2021-04-24 ENCOUNTER — Other Ambulatory Visit (INDEPENDENT_AMBULATORY_CARE_PROVIDER_SITE_OTHER): Payer: Medicare Other

## 2021-04-24 ENCOUNTER — Ambulatory Visit (INDEPENDENT_AMBULATORY_CARE_PROVIDER_SITE_OTHER): Payer: Medicare Other

## 2021-04-24 DIAGNOSIS — I700 Atherosclerosis of aorta: Secondary | ICD-10-CM | POA: Diagnosis not present

## 2021-04-24 DIAGNOSIS — R0989 Other specified symptoms and signs involving the circulatory and respiratory systems: Secondary | ICD-10-CM | POA: Diagnosis not present

## 2021-04-24 NOTE — Progress Notes (Addendum)
Cardiology Office Note:    Date:  04/27/2021   ID:  [name], DOB 1955-06-02, MRN [id number]  PCP:  Ailene Ards, NP  [facility] Cardiologist:  Kathlyn Sacramento, MD  [place] Electrophysiologist:  None   Referring MD: Eulogio Bear, NP   Chief Complaint: testing follow-up  History of Present Illness:    Grant [name] is a 66 y.o. male with a hx of chronic calcifications, HLD, HTN, intolerance to statins, previous tobacco use, Von Willebrands not currently on ASA, COPD, GERD, OSA on CPAP who presents for follow-up.   Patient had a CT scan of the lungs in April which showed 3V coronary artery calcifications and aortic calcifications.   The patient was seen 03/29/21 for chest pain. Cardiac CTA was ordered which showed no significant stenosis. US carotid dopplers showed no stenosis.  Today, he shares that he has had some dizziness/lightheadedness.  This was thought to be due to hypotension and once his blood pressure medications were adjusted and improved.  He spends a lot of time up and down on ladders for work.  He has not had any syncope or presyncope.  He does not have any chest pain.  He does endorse DOE.  He does have COPD and OSA and sleeps with a CPAP machine.  He explains today that he is not taking aspirin.  He thinks it might be due to his history of von Willebrand's disease.  Apparently this was diagnosed when he had a circumcision performed as an adult and hemorrhaged.  I would defer to his PCP for further work-up.  We will continue to hold aspirin for now.  His blood pressure is well controlled today in the clinic 130/70.  He is not on any cholesterol medicine currently and we discussed potentially starting one of the injectables since he has statin intolerance as well as Zetia intolerance.  We have plan to get him tied in with one of our lipid clinic pharmacist for appropriate education and dosing.  Reports no chest pain, pressure, or tightness. No edema,  orthopnea, PND. Reports no palpitations.     Past Medical History:  Diagnosis Date   Acid reflux    Allergy    Anemia    Anxiety    History of hiatal hernia    Hypertension    Insomnia    RLS (restless legs syndrome)    Sensorineural hearing loss    Von Willebrand disease    PT STATES THIS IS MILD AND HAS NEVER HAD TO SEE HEMATOLOGIST-PT DID SAY THAT WHEN [place] HE BLED ALOT BUT IT WAS DUE TO NOT FOLLOWING POST OP INSTRUCTIONS     Past Surgical History:  Procedure Laterality Date   CIRCUMCISION     AS AN ADULT   COLONOSCOPY     FOOT SURGERY Right    arch support   HERNIA REPAIR     INCISION AND DRAINAGE Right 02/04/2013   Procedure: INCISION AND DRAINAGE;  Surgeon: Linna Hoff, MD;  Location: [facility];  Service: Orthopedics;  Laterality: Right;   INGUINAL HERNIA REPAIR Bilateral 05/13/2018   Procedure: LAPAROSCOPIC BILATERAL INGUINAL HERNIA REPAIR;  Surgeon: Olean Ree, MD;  Location: [facility];  Service: General;  Laterality: Bilateral;   INGUINAL HERNIA REPAIR Right 06/17/2019   Procedure: HERNIA REPAIR INGUINAL ADULT WITH MESH-- Recurrent;  Surgeon: Olean Ree, MD;  Location: [facility];  Service: General;  Laterality: Right;   OPEN REDUCTION INTERNAL FIXATION (ORIF)  FINGER WITH RADIAL BONE GRAFT Right 02/04/2013   Procedure: OPEN REDUCTION INTERNAL FIXATION (ORIF) right ring and small fingers;  Surgeon: Linna Hoff, MD;  Location: [facility];  Service: Orthopedics;  Laterality: Right;    Current Medications: Current Meds  Medication Sig   amLODipine (NORVASC) 5 MG tablet Take 1 tablet (5 mg total) by mouth daily.   EPINEPHRINE 0.3 mg/0.3 mL IJ SOAJ injection INJECT 0.3 ML (0.3 MG) INTO THE MUSCLE ONCE FOR 1 DOSE   losartan (COZAAR) 50 MG tablet Take 1 tablet (50 mg total) by mouth daily.   pantoprazole (PROTONIX) 40 MG tablet TAKE 1 TABLET DAILY   rOPINIRole (REQUIP) 0.25 MG tablet Take 1 tablet (0.25 mg total) by mouth in the morning and  at bedtime.   Rotigotine (NEUPRO) 1 MG/24HR PT24 Place 1 patch (1 mg total) onto the skin daily at 4 PM.     Allergies:   Bee venom, Asa [aspirin], Atorvastatin, and Zetia [ezetimibe]   Social History   Socioeconomic History   Marital status: Married    Spouse name: Not on file   Number of children: 4   Years of education: 12   Highest education level: Not on file  Occupational History   Occupation: Sales executive   Tobacco Use   Smoking status: Former    Packs/day: 0.50    Years: 40.00    Pack years: 20.00    Types: Cigarettes    Quit date: 05/10/2016    Years since quitting: 4.9   Smokeless tobacco: Never  Vaping Use   Vaping Use: Former   Quit date: 05/10/2016  Substance and Sexual Activity   Alcohol use: No   Drug use: Yes    Types: Marijuana    Comment: uses daily   Sexual activity: Not on file  Other Topics Concern   Not on file  Social History Narrative   Denies caffeine use    Social Determinants of Health   Financial Resource Strain: Not on file  Food Insecurity: Not on file  Transportation Needs: Not on file  Physical Activity: Not on file  Stress: Not on file  Social Connections: Not on file     Family History: The patient's family history includes Hyperlipidemia in his father and mother; Hypertension in his father and mother; Stroke in his father. There is no history of Colon cancer, Esophageal cancer, Rectal cancer, or Stomach cancer.  ROS:   Please see the history of present illness.     All other systems reviewed and are negative.  EKGs/Labs/Other Studies Reviewed:    The following studies were reviewed today:  Carotid US 04/24/2021 Summary:  Right Carotid: There was no evidence of thrombus, dissection,  atherosclerotic                plaque or stenosis in the cervical carotid system.   Left Carotid: There was no evidence of thrombus, dissection,  atherosclerotic               plaque or stenosis in the cervical carotid system.    Vertebrals:  Bilateral vertebral arteries demonstrate antegrade flow.  Subclavians: Normal flow hemodynamics were seen in bilateral subclavian               arteries.   Cardiac CTA 04/16/21 1. Left Main:  No significant stenosis.   2. LAD: No significant stenosis.  FFRct 0.84 3. LCX: No significant stenosis.  FFRct 0.92 4. RCA: No significant stenosis.  FFRct 0.94   IMPRESSION:  1.  CT FFR analysis didn't show any significant stenosis.    EKG:  EKG is not ordered today.  Recent Labs: 06/16/2020: Hemoglobin 14.3; Platelets 266 03/26/2021: ALT 17; BUN 15; Creat 0.88; Potassium 4.1; Sodium 141; TSH 1.58  Recent Lipid Panel    Component Value Date/Time   CHOL 152 04/24/2021 0744   TRIG 56 04/24/2021 0744   HDL 43 04/24/2021 0744   CHOLHDL 3.5 04/24/2021 0744   CHOLHDL 4.5 10/25/2019 0817   VLDL 10 06/17/2016 0945   LDLCALC 97 04/24/2021 0744   LDLCALC 112 (H) 10/25/2019 0817       Physical Exam:    VS:  BP 130/70 (BP Location: Left Arm, Patient Position: Sitting, Cuff Size: Normal)    Pulse 62    Ht 5\' 9"  (1.753 m)    Wt 170 lb (77.1 kg)    SpO2 97%    BMI 25.10 kg/m     Wt Readings from Last 3 Encounters:  04/27/21 170 lb (77.1 kg)  03/29/21 173 lb (78.5 kg)  03/26/21 169 lb (76.7 kg)     GEN:  Well nourished, well developed in no acute distress HEENT: Normal NECK: No JVD; No carotid bruits LYMPHATICS: No lymphadenopathy CARDIAC: RRR, no murmurs, rubs, gallops RESPIRATORY:  Clear to auscultation without rales, wheezing or rhonchi  ABDOMEN: Soft, non-tender, non-distended MUSCULOSKELETAL:  No edema; No deformity  SKIN: Warm and dry NEUROLOGIC:  Alert and oriented x 3 PSYCHIATRIC:  Normal affect   ASSESSMENT:    1. Essential hypertension   2. Hyperlipidemia LDL goal <70   3. Cessation of tobacco use in previous 12 months   4. Pulmonary emphysema, unspecified emphysema type ([place])   5. OSA on CPAP    PLAN:    In order of problems listed above:  Chest  pain -No further episodes -Continue current medication regimen -Not on aspirin due to suspected von Willebrand's disease-would refer to PCP for further work-up -Recent cardiac CTA without any CAD  2. Right carotid bruit -recent US negative   3. Aortic atherosclerosis -tight lipid control  4. HLD -LDL 97 which is above goal  -Discussed injectable options and referred to pharmacy since he cannot tolerate statins  Disposition: Follow up 6 months with Dr. Fletcher Anon or APP  Signed, Elgie Collard, PA-C  04/27/2021 4:19 PM    [facility]

## 2021-04-25 LAB — LIPID PANEL
Chol/HDL Ratio: 3.5 ratio (ref 0.0–5.0)
Cholesterol, Total: 152 mg/dL (ref 100–199)
HDL: 43 mg/dL (ref >39)
LDL Chol Calc (NIH): 97 mg/dL (ref 0–99)
Triglycerides: 56 mg/dL (ref 0–149)
VLDL Cholesterol Cal: 12 mg/dL (ref 5–40)

## 2021-04-27 ENCOUNTER — Other Ambulatory Visit: Payer: Self-pay | Admitting: Family Medicine

## 2021-04-27 ENCOUNTER — Ambulatory Visit (INDEPENDENT_AMBULATORY_CARE_PROVIDER_SITE_OTHER): Payer: Medicare Other | Admitting: Physician Assistant

## 2021-04-27 ENCOUNTER — Other Ambulatory Visit: Payer: Self-pay

## 2021-04-27 ENCOUNTER — Encounter: Payer: Self-pay | Admitting: Medical

## 2021-04-27 VITALS — BP 130/70 | HR 62 | Ht 69.0 in | Wt 170.0 lb

## 2021-04-27 DIAGNOSIS — Z87891 Personal history of nicotine dependence: Secondary | ICD-10-CM | POA: Diagnosis not present

## 2021-04-27 DIAGNOSIS — I10 Essential (primary) hypertension: Secondary | ICD-10-CM

## 2021-04-27 DIAGNOSIS — G4733 Obstructive sleep apnea (adult) (pediatric): Secondary | ICD-10-CM

## 2021-04-27 DIAGNOSIS — E785 Hyperlipidemia, unspecified: Secondary | ICD-10-CM | POA: Diagnosis not present

## 2021-04-27 DIAGNOSIS — Z9989 Dependence on other enabling machines and devices: Secondary | ICD-10-CM

## 2021-04-27 DIAGNOSIS — J439 Emphysema, unspecified: Secondary | ICD-10-CM

## 2021-04-27 MED ORDER — NIRMATRELVIR/RITONAVIR (PAXLOVID)TABLET: 3.0000 | ORAL_TABLET | Freq: Two times a day (BID) | ORAL | 0 refills | Status: AC

## 2021-04-27 MED ORDER — NIRMATRELVIR/RITONAVIR (PAXLOVID)TABLET: 3.0000 | ORAL_TABLET | Freq: Two times a day (BID) | ORAL | 0 refills | Status: DC

## 2021-04-27 NOTE — Patient Instructions (Signed)
Medication Instructions:   Your physician recommends that you continue on your current medications as directed. Please refer to the Current Medication list given to you today.   *If you need a refill on your cardiac medications before your next appointment, please call your pharmacy*   Lab Work: None ordered  If you have labs (blood work) drawn today and your tests are completely normal, you will receive your results only by: [place] (if you have MyChart) OR A paper copy in the mail If you have any lab test that is abnormal or we need to change your treatment, we will call you to review the results.   Testing/Procedures: None ordered   Follow-Up: At [facility], you and your health needs are our priority.  As part of our continuing mission to provide you with exceptional heart care, we have created designated Provider Care Teams.  These Care Teams include your primary Cardiologist (physician) and Advanced Practice Providers (APPs -  Physician Assistants and Nurse Practitioners) who all work together to provide you with the care you need, when you need it.  We recommend signing up for the patient portal called "MyChart".  Sign up information is provided on this After Visit Summary.  MyChart is used to connect with patients for Virtual Visits (Telemedicine).  Patients are able to view lab/test results, encounter notes, upcoming appointments, etc.  Non-urgent messages can be sent to your provider as well.   To learn more about what you can do with MyChart, go to [email or web address].    Your next appointment:   6 month(s)  The format for your next appointment:   In Person  Provider:   Kathlyn [place], MD    Other Instructions N/A

## 2021-04-30 ENCOUNTER — Encounter: Payer: Self-pay | Admitting: Nurse Practitioner

## 2021-05-06 ENCOUNTER — Encounter: Payer: Self-pay | Admitting: Family Medicine

## 2021-05-07 NOTE — Telephone Encounter (Signed)
Called pt to reschedule appt. Pt states it was already rescheduled to 05/17/21 and that he is okay to wait until then to discuss the medication

## 2021-05-14 ENCOUNTER — Encounter: Payer: Self-pay | Admitting: Neurology

## 2021-05-14 ENCOUNTER — Other Ambulatory Visit: Payer: Self-pay

## 2021-05-14 ENCOUNTER — Ambulatory Visit (INDEPENDENT_AMBULATORY_CARE_PROVIDER_SITE_OTHER): Payer: Medicare Other | Admitting: Neurology

## 2021-05-14 VITALS — BP 123/73 | HR 62 | Ht 66.0 in | Wt 172.2 lb

## 2021-05-14 DIAGNOSIS — G4733 Obstructive sleep apnea (adult) (pediatric): Secondary | ICD-10-CM | POA: Diagnosis not present

## 2021-05-14 DIAGNOSIS — G2581 Restless legs syndrome: Secondary | ICD-10-CM | POA: Diagnosis not present

## 2021-05-14 DIAGNOSIS — Z9989 Dependence on other enabling machines and devices: Secondary | ICD-10-CM

## 2021-05-14 NOTE — Progress Notes (Signed)
Subjective:    Patient ID: Grant [name] is a 66 y.o. male.  HPI    Interim history:   Grant [name] is a 66 year old right-handed gentleman with an underlying medical history of hypertension, reflux disease, allergies, and overweight state, who Presents for follow-up consultation of his obstructive sleep apnea after interim home sleep testing and starting AutoPap therapy.  The patient is unaccompanied today.  I last saw him on 07/15/2017, at which time he was on ropinirole for his restless legs. He saw Grant Raid, NP on 11/27/2017. He saw Grant Presto, NP on 04/01/2019. He saw Lomax, NP again on 07/01/2019.  We switched blood patch in July 2021.  He is to immediately extend 01/03/2020 at which time he was stable on his medication.   He saw Grant Presto, NP on 01/02/2021, at which time he complained of daytime somnolence.  We proceeded with a home sleep test.  He had a home sleep test on 02/19/2021 which indicated mild obstructive sleep apnea, AHI was 7/h, O2 nadir 81%.  He was advised to start AutoPap therapy.  Set up date was 03/09/2022.  Today, 05/14/2021: I reviewed his compliance data for the past month, he used his machine 23 and 30 days with percent use days greater than 4 hours at 60%, slightly suboptimal compliance for more than 4 hours, average AHI at goal at 3/h, average pressure for the 95th percentile at 10.2 cm with a range of 5 to 11 cm.  Maximum pressure of 10.7 cm, leak on the higher side with the 95th percentile at 27.9 L/min.  He reports still adjusting to treatment.  He could not use his machine for a week last month because of his respiratory infection and congestion.  He is motivated to continue with treatment.  Restless leg symptoms are stable on Neupro patch 1 mg once daily and ropinirole 0.25 mg as needed.  He does have some difficulty with bloating from the AutoPap.  He uses a full facemask.  He could not use the nasal interface.  So far, he has noticed a modest improvement in his  daytime somnolence.  The patient's allergies, current medications, family history, past medical history, past social history, past surgical history and problem list were reviewed and updated as appropriate.    Previously (copied from previous notes for reference):    I saw him on 11/19/2016, at which time he was taking ropinirole 0.25 mg strength at 3 different times. He had recently suffered serious dog bites that became infected and needed treatment for this as an inpatient. I suggested cautious increase in his Requip 0.25 mg strength 2 pills 3 times a day. He was advised regarding augmentation.    I first met him on 07/15/2016 at the request of his primary care physician, at which time the patient reported snoring and excessive daytime somnolence, difficulty falling asleep, sleep disruption. He was invited for sleep study. He had a baseline sleep study on 07/31/2016. I went over his test results with him in detail today. Sleep efficiency was 76.7%, sleep latency 36 minutes, wake after sleep onset was 75 minutes with mild to moderate sleep fragmentation noted. He had an increased percentage of stage II sleep, slow-wave sleep at 16.9%, REM sleep was 15%. He had mild snoring. EKG showed normal sinus rhythm. Total AHI was 2.1 per hour, REM AHI was 14 per hour, supine AHI was 6.8 per hour. Average oxygen saturation was 95%, nadir was 85%. He had severe PLMS with an index of 65.6  per hour, associated with mild arousals of 9.2 per hour.   07/15/2016: He reports snoring and excessive daytime somnolence. I reviewed your office note from 06/17/2016. His Epworth sleepiness score is 16 out of 24, his fatigue score is 47 out of 63. He reports waking up with a sense of choking, difficulty falling asleep and sleep disruption. He estimates that he gets about 4-5 hours of sleep on any given day. He is married and lives with his wife. He has 4 children. He was a fairly heavy 1-2 pack per day smoker and quit in November  2017. He denies drinking alcohol or using illicit drugs. He denies utilizing caffeine on a day-to-day basis. He has been sleeping in a recliner for years, recently got a hospital twin bed and started sleeping in it, slightly elevated.  He had been on temazepam for about 3 years and recently weaned off of it.  He has had intermittent RLS symptoms. He has not been sleeping in the same bedroom with his wife due to both snoring.  He has had anxiety issues and has been taking OTC CBD oil.  Bedtime is between 11 and 12, he often takes melatonin for sleep. She denies night to night nocturia or morning headaches or family history of OSA. His wake up time is between 5:30 and 6 AM. He works part-time as an Clinical biochemist. He has a TV in the bedroom but does not rely on it staying on at night. They have one dog but the poodle does not sleep in his bedroom. His Past Medical History Is Significant For: Past Medical History:  Diagnosis Date   Acid reflux    Allergy    Anemia    Anxiety    History of hiatal hernia    Hypertension    Insomnia    RLS (restless legs syndrome)    Sensorineural hearing loss    Von Willebrand disease    PT STATES THIS IS MILD AND HAS NEVER HAD TO SEE HEMATOLOGIST-PT DID SAY THAT WHEN [place] A[place] HE BLED ALOT BUT IT WAS DUE TO NOT FOLLOWING POST OP INSTRUCTIONS     His Past Surgical History Is Significant For: Past Surgical History:  Procedure Laterality Date   CIRCUMCISION     AS AN ADULT   COLONOSCOPY     FOOT SURGERY Right    arch support   HERNIA REPAIR     INCISION AND DRAINAGE Right 02/04/2013   Procedure: INCISION AND DRAINAGE;  Surgeon: Linna Hoff, MD;  Location: [place];  Service: Orthopedics;  Laterality: Right;   INGUINAL HERNIA REPAIR Bilateral 05/13/2018   Procedure: LAPAROSCOPIC BILATERAL INGUINAL HERNIA REPAIR;  Surgeon: Olean Ree, MD;  Location: [facility];  Service: General;  Laterality: Bilateral;   INGUINAL HERNIA REPAIR  Right 06/17/2019   Procedure: HERNIA REPAIR INGUINAL ADULT WITH MESH-- Recurrent;  Surgeon: Olean Ree, MD;  Location: [facility];  Service: General;  Laterality: Right;   OPEN REDUCTION INTERNAL FIXATION (ORIF) FINGER WITH RADIAL BONE GRAFT Right 02/04/2013   Procedure: OPEN REDUCTION INTERNAL FIXATION (ORIF) right ring and small fingers;  Surgeon: Linna Hoff, MD;  Location: [place];  Service: Orthopedics;  Laterality: Right;    His Family History Is Significant For: Family History  Problem Relation Age of Onset   Hypertension Mother    Hyperlipidemia Mother    Hypertension Father    Hyperlipidemia Father    Stroke Father        x2  Colon cancer Neg Hx    Esophageal cancer Neg Hx    Rectal cancer Neg Hx    Stomach cancer Neg Hx    Sleep apnea Neg Hx     His Social History Is Significant For: Social History   Socioeconomic History   Marital status: Married    Spouse name: Not on file   Number of children: 4   Years of education: 12   Highest education level: Not on file  Occupational History   Occupation: Sales executive   Tobacco Use   Smoking status: Former    Packs/day: 0.50    Years: 40.00    Pack years: 20.00    Types: Cigarettes    Quit date: 05/10/2016    Years since quitting: 5.0   Smokeless tobacco: Never  Vaping Use   Vaping Use: Former   Quit date: 05/10/2016  Substance and Sexual Activity   Alcohol use: No   Drug use: Yes    Types: Marijuana    Comment: uses daily   Sexual activity: Not on file  Other Topics Concern   Not on file  Social History Narrative   Denies caffeine use    Social Determinants of Health   Financial Resource Strain: Not on file  Food Insecurity: Not on file  Transportation Needs: Not on file  Physical Activity: Not on file  Stress: Not on file  Social Connections: Not on file    His Allergies Are:  Allergies  Allergen Reactions   Bee Venom Anaphylaxis, Swelling and Rash   Asa [Aspirin] Other (See Comments)    Gi  upset   Atorvastatin Other (See Comments)    Joint pain   Zetia [Ezetimibe] Other (See Comments)    Joint pain  :   His Current Medications Are:  Outpatient Encounter Medications as of 05/14/2021  Medication Sig   amLODipine (NORVASC) 5 MG tablet Take 1 tablet (5 mg total) by mouth daily. (Patient taking differently: Take 10 mg by mouth daily.)   EPINEPHRINE 0.3 mg/0.3 mL IJ SOAJ injection INJECT 0.3 ML (0.3 MG) INTO THE MUSCLE ONCE FOR 1 DOSE   losartan (COZAAR) 50 MG tablet Take 1 tablet (50 mg total) by mouth daily. (Patient taking differently: Take 25 mg by mouth daily.)   pantoprazole (PROTONIX) 40 MG tablet TAKE 1 TABLET DAILY   rOPINIRole (REQUIP) 0.25 MG tablet Take 1 tablet (0.25 mg total) by mouth in the morning and at bedtime.   Rotigotine (NEUPRO) 1 MG/24HR PT24 Place 1 patch (1 mg total) onto the skin daily at 4 PM.   No facility-administered encounter medications on file as of 05/14/2021.  :  Review of Systems:  Out of a complete 14 point review of systems, all are reviewed and negative with the exception of these symptoms as listed below:  Review of Systems  Neurological:        Pt is here for CPAP follow . Pt had some issues with his mask. Pt states he has a full face mask and is doing better . Pt is not happy with [facility] . Pt states he is getting a lot more sleep time Pt states his  pressure is too much and is causing him to have a bloated stomach    Objective:  Neurological Exam  Physical Exam Physical Examination:   Vitals:   05/14/21 0829  BP: 123/73  Pulse: 62    General Examination: The patient is a very pleasant 66 y.o. male in no acute distress. He  appears well-developed and well-nourished and well groomed.   HEENT: Normocephalic, atraumatic, pupils are equal, round and reactive to light, called for follow-up of his implants, mild bilateral cataracts, tracking well-preserved, hearing is grossly intact.  Face is symmetric with normal facial animation,  airway examination reveals mild to moderate mouth dryness, nearly edentulous.  He reports that he needs the rest of his teeth pulled.  Speech is clear, edentulous sounding.  No carotid bruits. Tongue protrudes centrally and palate elevates symmetrically.  He has a beard.   Chest: Clear to auscultation without wheezing, rhonchi or crackles noted.   Heart: S1+S2+0, regular and normal without murmurs, rubs or gallops noted.    Abdomen: Soft, non-tender and non-distended.   Extremities: There is no obvious edema.    Skin: Warm and dry without trophic changes noted. There are no varicose veins.    Musculoskeletal: exam reveals no obvious joint deformities.    Neurologically:  Mental status: The patient is awake, alert and oriented in all 4 spheres. His immediate and remote memory, attention, language skills and fund of knowledge are appropriate. There is no evidence of aphasia, agnosia, apraxia or anomia. Speech is clear with normal prosody and enunciation. Thought process is linear. Mood is normal and affect is normal.  Cranial nerves II - XII are as described above under HEENT exam. Motor exam: Normal bulk, strength and tone is noted. There is no tremor. Fine motor skills and coordination: grossly intact.  Cerebellar testing: No dysmetria or intention tremor.  Sensory exam: intact to light touch.  Gait, station and balance: He stands easily. No veering to one side is noted. No leaning to one side is noted. Posture is age-appropriate and stance is narrow based. Gait shows normal stride length and normal pace. No problems turning are noted.      Assessment and Plan:  In summary, HALVOR BEHREND is a very pleasant 66 year old male with an underlying medical history of hypertension, reflux disease, allergies, and overweight state, who presents for follow up consultation of his sleep disordered breathing.  He was diagnosed with overall mild obstructive sleep apnea via home sleep test on 02/19/2021 and  received an AutoPap machine on 03/09/2021.  He has been compliant with treatment, last month had a 1 week lapse because of a respiratory infection.  He is motivated to continue with treatment and noticed some improvement in his daytime somnolence.  His restless leg symptoms are stable on the current medication regimen.  He has encouraged to try to be fully compliant with his AutoPap, he is advised to continue with his medications for restless legs.  He is advised to follow-up in this clinic to see Lomax, NP in 3 to 4 months routinely.  I answered all his questions today and he was in agreement. I spent 30 minutes in total face-to-face time and in reviewing records during pre-charting, more than 50% of which was spent in counseling and coordination of care, reviewing test results, reviewing medications and treatment regimen and/or in discussing or reviewing the diagnosis of OSA, RLS, the prognosis and treatment options. Pertinent laboratory and imaging test results that were available during this visit with the patient were reviewed by me and considered in my medical decision making (see chart for details).

## 2021-05-14 NOTE — Patient Instructions (Signed)
Please continue using your autoPAP regularly. While your insurance requires that you use PAP at least 4 hours each night on 70% of the nights, I recommend, that you not skip any nights and use it throughout the night if you can. Getting used to PAP and staying with the treatment long term does take time and patience and discipline. Untreated obstructive sleep apnea when it is moderate to severe can have an adverse impact on cardiovascular health and raise her risk for heart disease, arrhythmias, hypertension, congestive heart failure, stroke and diabetes. Untreated obstructive sleep apnea causes sleep disruption, nonrestorative sleep, and sleep deprivation. This can have an impact on your day to day functioning and cause daytime sleepiness and impairment of cognitive function, memory loss, mood disturbance, and problems focussing. Using PAP regularly can improve these symptoms. ? ?Please follow up with Debbora Presto, NP in about 3-4 mo. ?

## 2021-05-17 ENCOUNTER — Ambulatory Visit (INDEPENDENT_AMBULATORY_CARE_PROVIDER_SITE_OTHER): Payer: Medicare Other | Admitting: Nurse Practitioner

## 2021-05-17 ENCOUNTER — Encounter: Payer: Self-pay | Admitting: Nurse Practitioner

## 2021-05-17 ENCOUNTER — Other Ambulatory Visit: Payer: Self-pay

## 2021-05-17 VITALS — BP 128/64 | HR 60 | Temp 97.9°F | Ht 66.0 in | Wt 174.0 lb

## 2021-05-17 DIAGNOSIS — I10 Essential (primary) hypertension: Secondary | ICD-10-CM | POA: Diagnosis not present

## 2021-05-17 DIAGNOSIS — I700 Atherosclerosis of aorta: Secondary | ICD-10-CM

## 2021-05-17 LAB — COMPREHENSIVE METABOLIC PANEL
ALT: 32 U/L (ref 0–53)
AST: 26 U/L (ref 0–37)
Albumin: 4.2 g/dL (ref 3.5–5.2)
Alkaline Phosphatase: 81 U/L (ref 39–117)
BUN: 17 mg/dL (ref 6–23)
CO2: 28 mEq/L (ref 19–32)
Calcium: 9.3 mg/dL (ref 8.4–10.5)
Chloride: 105 mEq/L (ref 96–112)
Creatinine, Ser: 0.76 mg/dL (ref 0.40–1.50)
GFR: 93.31 mL/min (ref >60.00)
Glucose, Bld: 79 mg/dL (ref 70–99)
Potassium: 4.4 mEq/L (ref 3.5–5.1)
Sodium: 139 mEq/L (ref 135–145)
Total Bilirubin: 0.5 mg/dL (ref 0.2–1.2)
Total Protein: 6.6 g/dL (ref 6.0–8.3)

## 2021-05-17 MED ORDER — LOSARTAN POTASSIUM 25 MG PO TABS: 25.0000 mg | ORAL_TABLET | Freq: Every day | ORAL | 1 refills | Status: DC

## 2021-05-17 MED ORDER — AMLODIPINE BESYLATE 10 MG PO TABS: 10.0000 mg | ORAL_TABLET | Freq: Every day | ORAL | 1 refills | Status: DC

## 2021-05-17 NOTE — Assessment & Plan Note (Signed)
He seems to be tolerating the amlodipine at 10 mg by mouth daily and losartan at 25 mg by mouth daily.  We will make changes to his prescription so he no longer has to double up on tablets or cut in half tablets.  He was told to notify me if dizziness returns.  We will recheck metabolic panel today. ?

## 2021-05-17 NOTE — Progress Notes (Signed)
? ? ? ?Subjective:  ?Patient ID: Grant [name], male    DOB: 02-23-1956  Age: 66 y.o. MRN: [id number] ? ?CC:  ?Chief Complaint  ?Patient presents with  ? Transitions Of Care  ?  Pt states he has been having a lot going on with his health the past 3 months. He has been recently diagnosed with heart disease and COPD. He would like to also discuss his medications.  ?  ? ? ?HPI  ?This patient arrives today for the above. ? ?He is here to transfer care to myself.  His previous primary provider changed to a different practice.  Main concern today is that he want to discuss his antihypertensive dosing.  He has been taking amlodipine 10 mg by mouth daily and losartan 25 mg by mouth daily.  He currently has 5 mg tablets of amlodipine and 50 mg tablets of losartan, so want to see if he can get the tablet strength changed.  He is tolerating these dosages well.  He was having dizziness previously when his dosages were 10 mg of amlodipine and 50 mg of losartan daily, but the dizziness has resolved since reducing his losartan dose to 25 mg/day. ? ?He also tells me that he is waiting to hear from the advanced lipid disorder clinic which he was referred to by his cardiologist.  Last lipid panel shows LDL of 97.  He is statin intolerant is also has not been able to tolerate Zetia.  He has a history of three-vessel coronary artery calcifications and aortic calcifications. ? ?Otherwise, he reports feeling well overall. ? ?Past Medical History:  ?Diagnosis Date  ? Acid reflux   ? Allergy   ? Anemia   ? Anxiety   ? History of hiatal hernia   ? Hypertension   ? Insomnia   ? RLS (restless legs syndrome)   ? Sensorineural hearing loss   ? Von Willebrand disease   ? PT STATES THIS IS MILD AND HAS NEVER HAD TO SEE HEMATOLOGIST-PT DID SAY THAT WHEN [place] HE BLED ALOT BUT IT WAS DUE TO NOT FOLLOWING POST OP INSTRUCTIONS   ? ? ? ? ?Family History  ?Problem Relation Age of Onset  ? Hypertension Mother   ?  Hyperlipidemia Mother   ? Hypertension Father   ? Hyperlipidemia Father   ? Stroke Father   ?     x2  ? Colon cancer Neg Hx   ? Esophageal cancer Neg Hx   ? Rectal cancer Neg Hx   ? Stomach cancer Neg Hx   ? Sleep apnea Neg Hx   ? ? ?Social History  ? ?Social History Narrative  ? Denies caffeine use   ? ?Social History  ? ?Tobacco Use  ? Smoking status: Former  ?  Packs/day: 0.50  ?  Years: 40.00  ?  Pack years: 20.00  ?  Types: Cigarettes  ?  Quit date: 05/10/2016  ?  Years since quitting: 5.0  ? Smokeless tobacco: Never  ?Substance Use Topics  ? Alcohol use: No  ? ? ? ?Current Meds  ?Medication Sig  ? EPINEPHRINE 0.3 mg/0.3 mL IJ SOAJ injection INJECT 0.3 ML (0.3 MG) INTO THE MUSCLE ONCE FOR 1 DOSE  ? pantoprazole (PROTONIX) 40 MG tablet TAKE 1 TABLET DAILY  ? rOPINIRole (REQUIP) 0.25 MG tablet Take 1 tablet (0.25 mg total) by mouth in the morning and at bedtime.  ? Rotigotine (NEUPRO) 1 MG/24HR PT24 Place 1 patch (  1 mg total) onto the skin daily at 4 PM.  ? [DISCONTINUED] amLODipine (NORVASC) 5 MG tablet Take 1 tablet (5 mg total) by mouth daily. (Patient taking differently: Take 10 mg by mouth daily.)  ? [DISCONTINUED] losartan (COZAAR) 50 MG tablet Take 1 tablet (50 mg total) by mouth daily. (Patient taking differently: Take 25 mg by mouth daily.)  ? ? ?ROS:  ?Review of Systems  ?Constitutional:  Negative for malaise/fatigue.  ?Respiratory:  Negative for shortness of breath.   ?Cardiovascular:  Negative for chest pain.  ?Neurological:  Positive for dizziness (occasional, much improved sine reducing losartan).  ? ? ?Objective:  ? ?Today's Vitals: BP 128/64 (BP Location: Left Arm, Patient Position: Sitting, Cuff Size: Large)   Pulse 60   Temp 97.9 ?F (36.6 ?C) (Oral)   Ht '5\' 6"'  (1.676 m)   Wt 174 lb (78.9 kg)   SpO2 98%   BMI 28.08 kg/m?  ?Vitals with BMI 05/17/2021 05/14/2021 04/27/2021  ?Height '5\' 6"'  '5\' 6"'  '5\' 9"'   ?Weight 174 lbs 172 lbs 3 oz 170 lbs  ?BMI 28.1 27.81 25.09  ?Systolic 326 712 458  ?Diastolic 64 73  70  ?Pulse 60 62 62  ?Some encounter information is confidential and restricted. Go to Review Flowsheets activity to see all data.  ?  ? ?Physical Exam ?Vitals reviewed.  ?Constitutional:   ?   Appearance: Normal appearance.  ?HENT:  ?   Head: Normocephalic and atraumatic.  ?Cardiovascular:  ?   Rate and Rhythm: Normal rate and regular rhythm.  ?Pulmonary:  ?   Effort: Pulmonary effort is normal.  ?   Breath sounds: Normal breath sounds.  ?Musculoskeletal:  ?   Cervical back: Neck supple.  ?Skin: ?   General: Skin is warm and dry.  ?Neurological:  ?   Mental Status: He is alert and oriented to person, place, and time.  ?Psychiatric:     ?   Mood and Affect: Mood normal.     ?   Behavior: Behavior normal.     ?   Thought Content: Thought content normal.     ?   Judgment: Judgment normal.  ? ? ? ? ? ? ? ?Assessment and Plan  ? ?1. Aortic atherosclerosis ([place])   ?2. Hypertension, unspecified type   ? ? ? ?Plan: ?See plan via problem list below. ? ? ? ?Tests ordered ?Orders Placed This Encounter  ?Procedures  ? Comp Met (CMET)  ? ? ? ? ?Meds ordered this encounter  ?Medications  ? losartan (COZAAR) 25 MG tablet  ?  Sig: Take 1 tablet (25 mg total) by mouth daily.  ?  Dispense:  90 tablet  ?  Refill:  1  ?  Order Specific Question:   Supervising Provider  ?  Answer:   Binnie Rail [[id number]]  ? amLODipine (NORVASC) 10 MG tablet  ?  Sig: Take 1 tablet (10 mg total) by mouth daily.  ?  Dispense:  90 tablet  ?  Refill:  1  ?  Order Specific Question:   Supervising Provider  ?  Answer:   Binnie Rail [[id number]]  ? ? ?Patient to follow-up in 3 months, or sooner as needed. ? ?Grant Ards, NP ? ?

## 2021-05-17 NOTE — Assessment & Plan Note (Signed)
Per chart review I see referral to lipid clinic has been ordered.  It appears that is also been approved.  I have provided him with the phone number to call the lipid clinic on his own to see if he can get scheduled.  He reports he will give his cardiologist a couple more days to see if the lipid clinic will call him, but if they do not he will try to call them next week. ?

## 2021-05-17 NOTE — Patient Instructions (Signed)
Lipid Clinic: [phone] ?

## 2021-05-18 NOTE — Telephone Encounter (Signed)
Pt would like clarification that potassium and everything else looks okay ?

## 2021-05-21 ENCOUNTER — Ambulatory Visit: Payer: Medicare Other | Admitting: Neurology

## 2021-05-28 ENCOUNTER — Telehealth: Payer: Self-pay | Admitting: Neurology

## 2021-05-28 NOTE — Telephone Encounter (Signed)
[place] Chinita Pester) requesting chart notes for 05/15/11 for insurance. If you have any questions can contact at [phone]. ?Fax to: [phone] ?

## 2021-05-31 ENCOUNTER — Encounter: Payer: Self-pay | Admitting: Family Medicine

## 2021-05-31 ENCOUNTER — Encounter: Payer: Self-pay | Admitting: Nurse Practitioner

## 2021-05-31 NOTE — Telephone Encounter (Signed)
Patient scheduled with provider on 06/07/2021 ?

## 2021-06-07 ENCOUNTER — Encounter: Payer: Self-pay | Admitting: Family Medicine

## 2021-06-07 ENCOUNTER — Ambulatory Visit (INDEPENDENT_AMBULATORY_CARE_PROVIDER_SITE_OTHER): Payer: Medicare Other | Admitting: Nurse Practitioner

## 2021-06-07 ENCOUNTER — Encounter: Payer: Self-pay | Admitting: Nurse Practitioner

## 2021-06-07 ENCOUNTER — Other Ambulatory Visit: Payer: Self-pay | Admitting: Nurse Practitioner

## 2021-06-07 VITALS — BP 132/68 | HR 57 | Temp 98.0°F | Ht 66.0 in | Wt 176.0 lb

## 2021-06-07 DIAGNOSIS — R42 Dizziness and giddiness: Secondary | ICD-10-CM

## 2021-06-07 DIAGNOSIS — M79672 Pain in left foot: Secondary | ICD-10-CM | POA: Diagnosis not present

## 2021-06-07 DIAGNOSIS — G629 Polyneuropathy, unspecified: Secondary | ICD-10-CM | POA: Diagnosis not present

## 2021-06-07 DIAGNOSIS — I10 Essential (primary) hypertension: Secondary | ICD-10-CM

## 2021-06-07 DIAGNOSIS — R413 Other amnesia: Secondary | ICD-10-CM | POA: Insufficient documentation

## 2021-06-07 DIAGNOSIS — M79671 Pain in right foot: Secondary | ICD-10-CM | POA: Insufficient documentation

## 2021-06-07 LAB — COMPREHENSIVE METABOLIC PANEL
ALT: 48 U/L (ref 0–53)
AST: 32 U/L (ref 0–37)
Albumin: 4.4 g/dL (ref 3.5–5.2)
Alkaline Phosphatase: 96 U/L (ref 39–117)
BUN: 19 mg/dL (ref 6–23)
CO2: 28 mEq/L (ref 19–32)
Calcium: 9.4 mg/dL (ref 8.4–10.5)
Chloride: 106 mEq/L (ref 96–112)
Creatinine, Ser: 0.82 mg/dL (ref 0.40–1.50)
GFR: 91.16 mL/min (ref >60.00)
Glucose, Bld: 95 mg/dL (ref 70–99)
Potassium: 4.2 mEq/L (ref 3.5–5.1)
Sodium: 141 mEq/L (ref 135–145)
Total Bilirubin: 0.4 mg/dL (ref 0.2–1.2)
Total Protein: 6.9 g/dL (ref 6.0–8.3)

## 2021-06-07 LAB — CBC
HCT: 42.3 % (ref 39.0–52.0)
Hemoglobin: 14.3 g/dL (ref 13.0–17.0)
MCHC: 33.8 g/dL (ref 30.0–36.0)
MCV: 91.3 fl (ref 78.0–100.0)
Platelets: 253 10*3/uL (ref 150.0–400.0)
RBC: 4.64 Mil/uL (ref 4.22–5.81)
RDW: 13.7 % (ref 11.5–15.5)
WBC: 6 10*3/uL (ref 4.0–10.5)

## 2021-06-07 LAB — B12 AND FOLATE PANEL
Folate: >24.2 ng/mL (ref >5.9)
Vitamin B-12: 520 pg/mL (ref 211–911)

## 2021-06-07 LAB — URIC ACID: Uric Acid, Serum: 4.3 mg/dL (ref 4.0–7.8)

## 2021-06-07 MED ORDER — AMLODIPINE BESYLATE 5 MG PO TABS: 5.0000 mg | ORAL_TABLET | Freq: Every day | ORAL | 1 refills | Status: DC

## 2021-06-07 MED ORDER — LOSARTAN POTASSIUM 50 MG PO TABS: 50.0000 mg | ORAL_TABLET | Freq: Every day | ORAL | 1 refills | Status: DC

## 2021-06-07 NOTE — Assessment & Plan Note (Signed)
Change amlodipine to 5 mg a mouth daily and losartan 50 mg a mouth daily.  Follow-up in 2 weeks for close monitoring.  Consider rechecking metabolic panel at that time. ?

## 2021-06-07 NOTE — Assessment & Plan Note (Signed)
See plan under neuropathy. ?

## 2021-06-07 NOTE — Assessment & Plan Note (Addendum)
Pain in his feet sounds more like neuropathy as opposed to gout.  No gait shuffling noted, no facial mask noted. However, will check uric acid level as well as B12.  We had discussion regarding possibly increasing Requip if blood work comes back normal.  He was also encouraged to discuss this with his neurologist as he may be a candidate for EMG testing.  Further recommendations may be made based on blood work results. ?

## 2021-06-07 NOTE — Patient Instructions (Signed)
Call the Neurologist regarding the pain in your feet and memory concerns ? ?Call cardiology about dizziness in the setting of bradycardia (slow heart rate) ?

## 2021-06-07 NOTE — Assessment & Plan Note (Signed)
Overall symptoms improving, however patient is bradycardic on exam and EKG.  He was encouraged to call his cardiologist for further evaluation of his bradycardia.  We will also readdress his antihypertensives so that he will take 5 mg of amlodipine and 50 mg of losartan.  We will recheck CBC and CMP today as well as get B12 level for further evaluation, further recommendations may be made based upon these results.  Patient will follow-up in 2 weeks for close monitoring. ?

## 2021-06-07 NOTE — Progress Notes (Signed)
? ? ? ?Subjective:  ?Patient ID: Grant [name], male    DOB: 1955/10/26  Age: 66 y.o. MRN: [id number] ? ?CC:  ?Chief Complaint  ?Patient presents with  ? Dizziness  ?  ? ? ?HPI  ?This patient arrives today for multiple concerns. ? ?Dizziness: He continues to have intermittent dizzy spells.  Only trigger that he has noticed his stress.  We did make changes to his antihypertensive medications at last office visit and tells me since making these changes spells seem to be less severe.  For example he used to have to grab onto objects to help keep his balance, but tells me this is no longer occurring.  Spells last 5 to 10 minutes at a time.  Sometimes will be caused by position changes but not always.  Denies any chest pain or obvious shortness of breath.  He denies any weakness, does report seeing black spots in his vision sometimes with dizziness.  He will usually stop and rest and the dizziness will resolve.  He also reports that he smokes marijuana multiple times a day.  He is currently on amlodipine 10 mg a mouth daily and losartan 25 mg by mouth daily.  Last blood work showed no anemia, metabolic panel with normal electrolytes and EGFR. ? ?Bilateral foot pain/numbness: He also reports that he has intermittent pain to the ball of bilateral feet.  He also sometimes experience tingling or numbness.  No personal history of diabetes, he does have restless leg syndrome for which he takes Requip at night (0.25-0.6m based on if his legs are bothering him; he reports being on Requip for about 2 years) and wears a rotigotine patch during the day.  He has taken gabapentin in the past, which helped his symptoms of restless leg.  However he took a drug holiday from this but when he went to restart it caused suicidal ideation.  About 10 years ago he had his circulation checked in his legs and was told that everything looked normal.  He is concerned he may have gout.  No hyperglycemia noted on patient's last blood  work. ? ?Short-term memory loss: Over the last year he has noticed some short-term memory loss.  His main concern is that he will be driving and forget where he is going.  He quickly remembers within a few minutes.  He denies any difficulties with word finding or losing objects.  He has been experiencing quite a bit of stress at his job. ? ?Past Medical History:  ?Diagnosis Date  ? Acid reflux   ? Allergy   ? Anemia   ? Anxiety   ? History of hiatal hernia   ? Hypertension   ? Insomnia   ? RLS (restless legs syndrome)   ? Sensorineural hearing loss   ? Von Willebrand disease   ? PT STATES THIS IS MILD AND HAS NEVER HAD TO SEE HEMATOLOGIST-PT DID SAY THAT WHEN HE WAS [place] AGO HE BLED ALOT BUT IT WAS DUE TO NOT FOLLOWING POST OP INSTRUCTIONS   ? ? ? ? ?Family History  ?Problem Relation Age of Onset  ? Hypertension Mother   ? Hyperlipidemia Mother   ? Hypertension Father   ? Hyperlipidemia Father   ? Stroke Father   ?     x2  ? Colon cancer Neg Hx   ? Esophageal cancer Neg Hx   ? Rectal cancer Neg Hx   ? Stomach cancer Neg Hx   ? Sleep apnea  Neg Hx   ? ? ?Social History  ? ?Social History Narrative  ? Denies caffeine use   ? ?Social History  ? ?Tobacco Use  ? Smoking status: Former  ?  Packs/day: 0.50  ?  Years: 40.00  ?  Pack years: 20.00  ?  Types: Cigarettes  ?  Quit date: 05/10/2016  ?  Years since quitting: 5.0  ? Smokeless tobacco: Never  ?Substance Use Topics  ? Alcohol use: No  ? ? ? ?Current Meds  ?Medication Sig  ? EPINEPHRINE 0.3 mg/0.3 mL IJ SOAJ injection INJECT 0.3 ML (0.3 MG) INTO THE MUSCLE ONCE FOR 1 DOSE  ? pantoprazole (PROTONIX) 40 MG tablet TAKE 1 TABLET DAILY  ? rOPINIRole (REQUIP) 0.25 MG tablet Take 1 tablet (0.25 mg total) by mouth in the morning and at bedtime.  ? Rotigotine (NEUPRO) 1 MG/24HR PT24 Place 1 patch (1 mg total) onto the skin daily at 4 PM.  ? [DISCONTINUED] amLODipine (NORVASC) 10 MG tablet Take 1 tablet (10 mg total) by mouth daily.  ? [DISCONTINUED]  losartan (COZAAR) 25 MG tablet Take 1 tablet (25 mg total) by mouth daily.  ? ? ?ROS:  ?Review of Systems  ?Eyes:  Negative for blurred vision and double vision.  ?     (+) "black spots" in vision "now and again" with dizzy episodes  ?Respiratory:  Negative for shortness of breath.   ?Cardiovascular:  Negative for chest pain.  ?Neurological:  Positive for dizziness and sensory change (has numbness in feet, chronic does not seem to occur with dizzy episodes). Negative for tingling, seizures, loss of consciousness and weakness.  ? ? ?Objective:  ? ?Today's Vitals: BP 132/68 (BP Location: Left Arm, Patient Position: Sitting, Cuff Size: Large)   Pulse (!) 57   Temp 98 ?F (36.7 ?C) (Oral)   Ht 5' 6" (1.676 m)   Wt 176 lb (79.8 kg)   SpO2 97%   BMI 28.41 kg/m?  ? ?  06/07/2021  ?  7:59 AM 05/17/2021  ? 10:39 AM 05/14/2021  ?  8:29 AM  ?Vitals with BMI  ?Height 5' 6" 5' 6" 5' 6"  ?Weight 176 lbs 174 lbs 172 lbs 3 oz  ?BMI 28.42 28.1 27.81  ?Systolic 035 597 416  ?Diastolic 68 64 73  ?Pulse 57 60 62  ?  ? ? ?  06/07/2021  ?  9:13 AM 07/20/2020  ?  8:28 AM  ?6CIT Screen  ?What Year? 0 points 0 points  ?What month? 0 points 0 points  ?What time? 0 points 0 points  ?Count back from 20 0 points 0 points  ?Months in reverse 4 points 0 points  ?Repeat phrase 0 points 4 points  ?Total Score 4 points 4 points  ? ? ? ? ?Physical Exam ?Vitals reviewed.  ?Constitutional:   ?   Appearance: Normal appearance.  ?HENT:  ?   Head: Normocephalic and atraumatic.  ?Cardiovascular:  ?   Rate and Rhythm: Regular rhythm. Bradycardia present.  ?Pulmonary:  ?   Effort: Pulmonary effort is normal.  ?   Breath sounds: Normal breath sounds.  ?Musculoskeletal:  ?   Cervical back: Neck supple.  ?Skin: ?   General: Skin is warm and dry.  ?Neurological:  ?   General: No focal deficit present.  ?   Mental Status: He is alert and oriented to person, place, and time.  ?   Motor: Motor function is intact.  ?   Gait: Gait is intact.  ?  Psychiatric:     ?   Mood  and Affect: Mood normal.     ?   Behavior: Behavior normal.     ?   Thought Content: Thought content normal.     ?   Judgment: Judgment normal.  ? ? ? ? ?Orthostatic BP: sitting: 128/60; standing: 122/78 ?EKG: sinus bradycardia (HR 51), possible sign of incomplete RBBB on aVL, aVF, III.  ? ? ?Assessment and Plan  ? ?1. Dizziness   ?2. Neuropathy   ?3. Pain in both feet   ?4. Hypertension, unspecified type   ?5. Short-term memory loss   ? ? ? ?Plan: ?See plan via problem list below. ?3. See plan under neuropathy ? ? ? ?Tests ordered ?Orders Placed This Encounter  ?Procedures  ? CBC  ? Uric acid  ? B12 and Folate Panel  ? Comp Met (CMET)  ? EKG 12-Lead  ? ? ? ? ?Meds ordered this encounter  ?Medications  ? amLODipine (NORVASC) 5 MG tablet  ?  Sig: Take 1 tablet (5 mg total) by mouth daily.  ?  Dispense:  90 tablet  ?  Refill:  1  ?  Order Specific Question:   Supervising Provider  ?  Answer:   Binnie Rail [[id number]]  ? losartan (COZAAR) 50 MG tablet  ?  Sig: Take 1 tablet (50 mg total) by mouth daily.  ?  Dispense:  90 tablet  ?  Refill:  1  ?  Order Specific Question:   Supervising Provider  ?  Answer:   Binnie Rail [[id number]]  ? ? ?Patient to follow-up in 2 weeks or sooner as needed ? ?Ailene Ards, NP ? ?

## 2021-06-07 NOTE — Assessment & Plan Note (Addendum)
6Cit is reassuring today score of 4.  Patient encouraged to discuss further with neurology as he is quite concerned regarding his short-term memory loss.  Also reassuring that patient did not have any Gait shuffling nor facial mask.  However he is experiencing dizziness as well as bradycardia in the setting of someone with restless leg syndrome and concerns with short-term memory loss. Concerns may be related to stress, but he may need to be evaluated for other etiologies with neurology.  He was encouraged to call his neurologist office regarding these concerns and he is agreeable to this. ?

## 2021-06-08 NOTE — Telephone Encounter (Signed)
BP overall looks well controlled. Heart rates low normal. Given dizziness, EKG in primary care showing bradycardia not on AV nodal blocking agents - please mail patient 14 day ZIO-XT monitor and follow up with Dr. Fletcher Anon or APP in 6 weeks. ? ?Loel Dubonnet, NP  ?

## 2021-06-11 ENCOUNTER — Encounter: Payer: Self-pay | Admitting: Internal Medicine

## 2021-06-11 ENCOUNTER — Ambulatory Visit (INDEPENDENT_AMBULATORY_CARE_PROVIDER_SITE_OTHER): Payer: Medicare Other | Admitting: Internal Medicine

## 2021-06-11 ENCOUNTER — Ambulatory Visit (INDEPENDENT_AMBULATORY_CARE_PROVIDER_SITE_OTHER): Payer: Medicare Other

## 2021-06-11 ENCOUNTER — Telehealth: Payer: Self-pay | Admitting: Cardiovascular Disease

## 2021-06-11 ENCOUNTER — Other Ambulatory Visit: Payer: Self-pay | Admitting: *Deleted

## 2021-06-11 VITALS — BP 125/60 | HR 59 | Ht 66.0 in | Wt 175.6 lb

## 2021-06-11 DIAGNOSIS — R001 Bradycardia, unspecified: Secondary | ICD-10-CM

## 2021-06-11 DIAGNOSIS — E785 Hyperlipidemia, unspecified: Secondary | ICD-10-CM

## 2021-06-11 DIAGNOSIS — I10 Essential (primary) hypertension: Secondary | ICD-10-CM | POA: Diagnosis not present

## 2021-06-11 DIAGNOSIS — R931 Abnormal findings on diagnostic imaging of heart and coronary circulation: Secondary | ICD-10-CM

## 2021-06-11 DIAGNOSIS — I700 Atherosclerosis of aorta: Secondary | ICD-10-CM | POA: Diagnosis not present

## 2021-06-11 DIAGNOSIS — R42 Dizziness and giddiness: Secondary | ICD-10-CM

## 2021-06-11 MED ORDER — ROSUVASTATIN CALCIUM 5 MG PO TABS
5.0000 mg | ORAL_TABLET | Freq: Every day | ORAL | 3 refills | Status: DC
Start: 2021-06-11 — End: 2021-07-02

## 2021-06-11 NOTE — Progress Notes (Signed)
? ? ?LIPID CLINIC CONSULT NOTE ? ?Chief Complaint:  ?Manage dyslipidemia ? ?Primary Care Physician: ?Grant Ards, NP ? ?Primary Cardiologist:  ?Grant Sacramento, MD ? ?HPI:  ?Grant [name] is a 66 y.o. male who is being seen today for the evaluation of dyslipidemia at the request of Grant [name], Cadence H, PA-C.  This is a pleasant 66 year old veteran patient at [facility] who is currently referred for evaluation management of dyslipidemia.  He recently had had some dizziness and other symptoms underwent coronary CT angiography.  This demonstrated aortic atherosclerosis and moderate nonobstructive coronary disease with a calcium score of 122, 46 percentile for age and sex matched controls.  There was moderate stenosis in the mid LAD and mild stenosis in the proximal LAD and RCA.  Aggressive medical therapy was recommended.  Unfortunately has been intolerant to statins in the past including high and low-dose atorvastatin and then was also tried on ezetimibe, both of which caused myalgias.  He has not tried any other therapies.  His most recent untreated lipid in February showed total cholesterol 152, HDL 43, LDL 97 and triglycerides 56. ? ?PMHx:  ?Past Medical History:  ?Diagnosis Date  ? Acid reflux   ? Allergy   ? Anemia   ? Anxiety   ? History of hiatal hernia   ? Hypertension   ? Insomnia   ? RLS (restless legs syndrome)   ? Sensorineural hearing loss   ? Von Willebrand disease ([place])   ? PT STATES THIS IS MILD AND HAS NEVER HAD TO SEE HEMATOLOGIST-PT DID SAY THAT WHEN HE WAS CIR[place] AGO HE BLED ALOT BUT IT WAS DUE TO NOT FOLLOWING POST OP INSTRUCTIONS   ? ? ?Past Surgical History:  ?Procedure Laterality Date  ? CIRCUMCISION    ? AS AN ADULT  ? COLONOSCOPY    ? FOOT SURGERY Right   ? arch support  ? HERNIA REPAIR    ? INCISION AND DRAINAGE Right 02/04/2013  ? Procedure: INCISION AND DRAINAGE;  Surgeon: Grant Hoff, MD;  Location: [place];  Service: Orthopedics;  Laterality: Right;  ?  INGUINAL HERNIA REPAIR Bilateral 05/13/2018  ? Procedure: LAPAROSCOPIC BILATERAL INGUINAL HERNIA REPAIR;  Surgeon: Grant Ree, MD;  Location: [facility];  Service: General;  Laterality: Bilateral;  ? INGUINAL HERNIA REPAIR Right 06/17/2019  ? Procedure: HERNIA REPAIR INGUINAL ADULT WITH MESH-- Recurrent;  Surgeon: Grant Ree, MD;  Location: [facility];  Service: General;  Laterality: Right;  ? OPEN REDUCTION INTERNAL FIXATION (ORIF) FINGER WITH RADIAL BONE GRAFT Right 02/04/2013  ? Procedure: OPEN REDUCTION INTERNAL FIXATION (ORIF) right ring and small fingers;  Surgeon: Grant Hoff, MD;  Location: [place];  Service: Orthopedics;  Laterality: Right;  ? ? ?FAMHx:  ?Family History  ?Problem Relation Age of Onset  ? Hypertension Mother   ? Hyperlipidemia Mother   ? Hypertension Father   ? Hyperlipidemia Father   ? Stroke Father   ?     x2  ? Colon cancer Neg Hx   ? Esophageal cancer Neg Hx   ? Rectal cancer Neg Hx   ? Stomach cancer Neg Hx   ? Sleep apnea Neg Hx   ? ? ?SOCHx:  ? reports that he quit smoking about 5 years ago. His smoking use included cigarettes. He has a 20.00 pack-year smoking history. He has never used smokeless tobacco. He reports current drug use. Drug: Marijuana. He reports that he does not drink alcohol. ? ?ALLERGIES:  ?  Allergies  ?Allergen Reactions  ? Bee Venom Anaphylaxis, Swelling and Rash  ? Asa [Aspirin] Other (See Comments)  ?  Gi upset  ? Atorvastatin Other (See Comments)  ?  Joint pain  ? Zetia [Ezetimibe] Other (See Comments)  ?  Joint pain  ? ? ?ROS: ?Pertinent items noted in HPI and remainder of comprehensive ROS otherwise negative. ? ?HOME MEDS: ?Current Outpatient Medications on File Prior to Visit  ?Medication Sig Dispense Refill  ? amLODipine (NORVASC) 5 MG tablet Take 1 tablet (5 mg total) by mouth daily. 90 tablet 1  ? EPINEPHRINE 0.3 mg/0.3 mL IJ SOAJ injection INJECT 0.3 ML (0.3 MG) INTO THE MUSCLE ONCE FOR 1 DOSE 2 each 11  ? losartan (COZAAR) 50 MG tablet Take 1 tablet (50 mg  total) by mouth daily. 90 tablet 1  ? pantoprazole (PROTONIX) 40 MG tablet TAKE 1 TABLET DAILY 90 tablet 3  ? rOPINIRole (REQUIP) 0.25 MG tablet Take 1 tablet (0.25 mg total) by mouth in the morning and at bedtime. 180 tablet 3  ? Rotigotine (NEUPRO) 1 MG/24HR PT24 Place 1 patch (1 mg total) onto the skin daily at 4 PM. 90 patch 3  ? ?No current facility-administered medications on file prior to visit.  ? ? ?LABS/IMAGING: ?No results found for this or any previous visit (from the past 48 hour(s)). ?No results found. ? ?LIPID PANEL: ?   ?Component Value Date/Time  ? CHOL 152 04/24/2021 0744  ? TRIG 56 04/24/2021 0744  ? HDL 43 04/24/2021 0744  ? CHOLHDL 3.5 04/24/2021 0744  ? CHOLHDL 4.5 10/25/2019 0817  ? VLDL 10 06/17/2016 0945  ? [place] 97 04/24/2021 0744  ? LDLCALC 112 (H) 10/25/2019 0817  ? ? ?WEIGHTS: ?Wt Readings from Last 3 Encounters:  ?06/11/21 175 lb 9.6 oz (79.7 kg)  ?06/07/21 176 lb (79.8 kg)  ?05/17/21 174 lb (78.9 kg)  ? ? ?VITALS: ?BP 125/60   Pulse (!) 59   Ht '5\' 6"'$  (1.676 m)   Wt 175 lb 9.6 oz (79.7 kg)   SpO2 98%   BMI 28.34 kg/m?  ? ?EXAM: ?Deferred ? ?EKG: ?Deferred ? ?ASSESSMENT: ?Mixed dyslipidemia, goal LDL less than 70 ?Mild to moderate nonobstructive coronary disease by CTA, CAC score 122 (56 percentile)-04/16/2021 ?Hypertension ? ?PLAN: ?1.   Grant [name] is a mixed dyslipidemia and actually is fairly close to his a target LDL less than 70.  He was not tolerant of atorvastatin but has not tried any other statins at this point.  He was also tried on ezetimibe however had muscle pains with that.  I think he would likely tolerate low-dose rosuvastatin 5 mg daily we had a long discussion about that today.  We will go ahead and send a 30-day supply to his pharmacy.  If its not tolerated then he should notify us and given his insurance, he might be a candidate for Leqvio.  Otherwise, if tolerated will prescribe through [facility] and plan repeat lipid in about 3 to 4 months with follow-up  at that time.  I am not sure at this point he would qualify for PCSK9 inhibitor since he is has not failed more than 2 statins, has a calcium score less than 300 and has had no prior stroke or heart attack or intervention. ? ?Thanks again for the kind referral. ? ?Pixie Casino, MD, [facility], FACP  ?[place]  ?Medical Director of the Advanced Lipid Disorders &  ?Cardiovascular Risk Reduction Clinic ?Diplomate of  the American Board of Clinical Lipidology ?Attending Cardiologist  ?Direct Dial: [phone]  Fax: [phone]  ?Website:  [email or web address] ? ?Nadean Corwin Kathyrn [place]uth ?06/11/2021, 9:04 AM ?

## 2021-06-11 NOTE — Patient Instructions (Signed)
Medication Instructions:  ?START rosuvastatin (crestor) '5mg'$  daily  ? ?*If you need a refill on your cardiac medications before your next appointment, please call your pharmacy* ? ? ?Lab Work: ?FASTING lab work to check cholesterol in about 3-4 months ?-- NMR Lipoprofile ?-- complete about 1 week before your next visit with Dr. Debara Pickett  ? ?If you have labs (blood work) drawn today and your tests are completely normal, you will receive your results only by: ?MyChart Message (if you have MyChart) OR ?A paper copy in the mail ?If you have any lab test that is abnormal or we need to change your treatment, we will call you to review the results. ? ? ?Testing/Procedures: ?NONE ? ? ?Follow-Up: ?At [facility], you and your health needs are our priority.  As part of our continuing mission to provide you with exceptional heart care, we have created designated Provider Care Teams.  These Care Teams include your primary Cardiologist (physician) and Advanced Practice Providers (APPs -  Physician Assistants and Nurse Practitioners) who all work together to provide you with the care you need, when you need it. ? ?We recommend signing up for the patient portal called "MyChart".  Sign up information is provided on this After Visit Summary.  MyChart is used to connect with patients for Virtual Visits (Telemedicine).  Patients are able to view lab/test results, encounter notes, upcoming appointments, etc.  Non-urgent messages can be sent to your provider as well.   ?To learn more about what you can do with MyChart, go to [email or web address].   ? ?Your next appointment:   ?3-4 months with Dr. Debara Pickett  ?-- lipid clinic ?-- in person or video visit ? ?

## 2021-06-11 NOTE — Telephone Encounter (Signed)
ERROR

## 2021-06-11 NOTE — Progress Notes (Signed)
Zio XT heart monitor order placed.  ? ? ?

## 2021-06-13 DIAGNOSIS — R42 Dizziness and giddiness: Secondary | ICD-10-CM | POA: Diagnosis not present

## 2021-06-13 DIAGNOSIS — R001 Bradycardia, unspecified: Secondary | ICD-10-CM

## 2021-06-14 ENCOUNTER — Encounter: Payer: Self-pay | Admitting: Neurology

## 2021-06-21 ENCOUNTER — Encounter: Payer: Self-pay | Admitting: Nurse Practitioner

## 2021-06-21 ENCOUNTER — Ambulatory Visit: Payer: Medicare Other | Admitting: Nurse Practitioner

## 2021-06-21 ENCOUNTER — Ambulatory Visit (INDEPENDENT_AMBULATORY_CARE_PROVIDER_SITE_OTHER): Payer: Medicare Other | Admitting: Nurse Practitioner

## 2021-06-21 VITALS — BP 110/62 | HR 54 | Temp 98.0°F | Ht 66.0 in | Wt 179.0 lb

## 2021-06-21 DIAGNOSIS — L989 Disorder of the skin and subcutaneous tissue, unspecified: Secondary | ICD-10-CM | POA: Insufficient documentation

## 2021-06-21 DIAGNOSIS — I700 Atherosclerosis of aorta: Secondary | ICD-10-CM

## 2021-06-21 DIAGNOSIS — I10 Essential (primary) hypertension: Secondary | ICD-10-CM

## 2021-06-21 DIAGNOSIS — R21 Rash and other nonspecific skin eruption: Secondary | ICD-10-CM | POA: Insufficient documentation

## 2021-06-21 MED ORDER — TRIAMCINOLONE ACETONIDE 0.1 % EX CREA
1.0000 | TOPICAL_CREAM | Freq: Two times a day (BID) | CUTANEOUS | 0 refills | Status: DC
Start: 2021-06-21 — End: 2022-02-25

## 2021-06-21 NOTE — Assessment & Plan Note (Signed)
Lesion is stable.  Etiology unclear.  It does look consistent with possible squamous cell carcinoma.  Referral to dermatology made today.  Patient prescribed triamcinolone cream that he can apply twice a day for the next week or 2 to see if this helps the lesion as well as help with the itching.  Patient was encouraged to make sure he follows up with dermatology for evaluation, he reports understanding. ?

## 2021-06-21 NOTE — Progress Notes (Signed)
? ? ? ?Subjective:  ?Patient ID: Grant [name], male    DOB: 1955/11/18  Age: 66 y.o. MRN: [id number] ? ?CC:  ?Chief Complaint  ?Patient presents with  ? Follow-up  ? Hyperlipidemia  ?  ? ? ?HPI  ?This patient arrives today for the above. ? ?Hyperlipidemia/CAD: Patient referred to Dr. Debara Pickett.  He has failed atorvastatin and ezetimibe.  Dr. Berdine Addison to start him on rosuvastatin 5 mg a mouth daily.  Patient reports has been taking it for about a week and is tolerating it well so far. ? ?Dizziness: Dizziness seems to have mostly resolved since adjusting his antihypertensives.  He is currently on amlodipine 5 mg a mouth daily and losartan 50 mg a mouth daily.  He is currently wearing a cardiac monitor for evaluation. ? ?Skin lesion: He has a skin lesion located to the left ankle, lateral aspect.  He reports has been present for a few months.  It is itching.  He denies any bleeding.  He denies that is getting any bigger. ? ?Past Medical History:  ?Diagnosis Date  ? Acid reflux   ? Allergy   ? Anemia   ? Anxiety   ? History of hiatal hernia   ? Hypertension   ? Insomnia   ? RLS (restless legs syndrome)   ? Sensorineural hearing loss   ? Von Willebrand disease ([place])   ? PT STATES THIS IS MILD AND HAS NEVER HAD TO SEE HEMATOLOGIST-PT DID SAY THAT WHEN [place] HE BLED ALOT BUT IT WAS DUE TO NOT FOLLOWING POST OP INSTRUCTIONS   ? ? ? ? ?Family History  ?Problem Relation Age of Onset  ? Hypertension Mother   ? Hyperlipidemia Mother   ? Hypertension Father   ? Hyperlipidemia Father   ? Stroke Father   ?     x2  ? Colon cancer Neg Hx   ? Esophageal cancer Neg Hx   ? Rectal cancer Neg Hx   ? Stomach cancer Neg Hx   ? Sleep apnea Neg Hx   ? ? ?Social History  ? ?Social History Narrative  ? Denies caffeine use   ? ?Social History  ? ?Tobacco Use  ? Smoking status: Former  ?  Packs/day: 0.50  ?  Years: 40.00  ?  Pack years: 20.00  ?  Types: Cigarettes  ?  Quit date: 05/10/2016  ?  Years since quitting: 5.1   ? Smokeless tobacco: Never  ?Substance Use Topics  ? Alcohol use: No  ? ? ? ?Current Meds  ?Medication Sig  ? amLODipine (NORVASC) 5 MG tablet Take 1 tablet (5 mg total) by mouth daily.  ? EPINEPHRINE 0.3 mg/0.3 mL IJ SOAJ injection INJECT 0.3 ML (0.3 MG) INTO THE MUSCLE ONCE FOR 1 DOSE  ? losartan (COZAAR) 50 MG tablet Take 1 tablet (50 mg total) by mouth daily.  ? pantoprazole (PROTONIX) 40 MG tablet TAKE 1 TABLET DAILY  ? rOPINIRole (REQUIP) 0.25 MG tablet Take 1 tablet (0.25 mg total) by mouth in the morning and at bedtime.  ? rosuvastatin (CRESTOR) 5 MG tablet Take 1 tablet (5 mg total) by mouth daily.  ? Rotigotine (NEUPRO) 1 MG/24HR PT24 Place 1 patch (1 mg total) onto the skin daily at 4 PM.  ? triamcinolone cream (KENALOG) 0.1 % Apply 1 application. topically 2 (two) times daily.  ? ? ?ROS:  ?Review of Systems  ?Respiratory:  Negative for shortness of breath.   ?Cardiovascular:  Negative for chest pain.  ?Neurological:  Negative for dizziness and headaches.  ? ? ?Objective:  ? ?Today's Vitals: BP 110/62   Pulse (!) 54   Temp 98 ?F (36.7 ?C) (Oral)   Ht '5\' 6"'$  (1.676 m)   Wt 179 lb (81.2 kg)   SpO2 94%   BMI 28.89 kg/m?  ? ?  06/21/2021  ? 12:55 PM 06/11/2021  ?  9:00 AM 06/07/2021  ?  7:59 AM  ?Vitals with BMI  ?Height '5\' 6"'$  '5\' 6"'$  '5\' 6"'$   ?Weight 179 lbs 175 lbs 10 oz 176 lbs  ?BMI 28.91 28.36 28.42  ?Systolic 366 440 347  ?Diastolic 62 60 68  ?Pulse 54 59 57  ?  ? ?Physical Exam ?Vitals reviewed.  ?Constitutional:   ?   Appearance: Normal appearance.  ?HENT:  ?   Head: Normocephalic and atraumatic.  ?Cardiovascular:  ?   Rate and Rhythm: Normal rate and regular rhythm.  ?Pulmonary:  ?   Effort: Pulmonary effort is normal.  ?   Breath sounds: Normal breath sounds.  ?Musculoskeletal:  ?   Cervical back: Neck supple.  ?Skin: ?   General: Skin is warm and dry.  ? ?    ?Neurological:  ?   Mental Status: He is alert and oriented to person, place, and time.  ?Psychiatric:     ?   Mood and Affect: Mood normal.      ?   Behavior: Behavior normal.     ?   Thought Content: Thought content normal.     ?   Judgment: Judgment normal.  ? ? ? ? ? ? ? ?Assessment and Plan  ? ?1. Skin lesion   ?2. Hypertension, unspecified type   ?3. Aortic atherosclerosis ([place])   ? ? ? ?Plan: ?See plan via problem list below. ? ? ?Tests ordered ?Orders Placed This Encounter  ?Procedures  ? Ambulatory referral to Dermatology  ? ? ? ? ?Meds ordered this encounter  ?Medications  ? triamcinolone cream (KENALOG) 0.1 %  ?  Sig: Apply 1 application. topically 2 (two) times daily.  ?  Dispense:  30 g  ?  Refill:  0  ?  Order Specific Question:   Supervising Provider  ?  Answer:   Binnie Rail [[id number]]  ? ? ?Patient to follow-up in 6 months or sooner as needed ? ?Ailene Ards, NP ? ?

## 2021-06-21 NOTE — Assessment & Plan Note (Signed)
Patient to continue on rosuvastatin 5 mg a mouth daily.  He will follow-up with Dr. Debara Pickett as scheduled.  We also discussed diet and I highly recommend he increase his intake of low-fat foods especially focus on plants. ?

## 2021-06-21 NOTE — Assessment & Plan Note (Addendum)
Chronic, stable patient to continue on amlodipine 5 mg mouth daily and losartan 50 mg by mouth daily.  Dizziness seems to have resolved.  ?

## 2021-06-22 ENCOUNTER — Other Ambulatory Visit: Payer: Self-pay | Admitting: Nurse Practitioner

## 2021-06-22 DIAGNOSIS — I10 Essential (primary) hypertension: Secondary | ICD-10-CM

## 2021-06-22 NOTE — Progress Notes (Signed)
Patient called and asked to return to office in the next week or so to have met panel drawn as his losartan dose was increased from 76m/day to 556mday. Patient is agreeable and will come by one afternoon to have his blood drawn.  ?

## 2021-06-27 ENCOUNTER — Ambulatory Visit: Payer: Medicare Other | Admitting: Neurology

## 2021-06-29 ENCOUNTER — Encounter: Payer: Self-pay | Admitting: Internal Medicine

## 2021-06-29 ENCOUNTER — Other Ambulatory Visit: Payer: Self-pay | Admitting: Nurse Practitioner

## 2021-06-29 DIAGNOSIS — L989 Disorder of the skin and subcutaneous tissue, unspecified: Secondary | ICD-10-CM

## 2021-06-29 DIAGNOSIS — G629 Polyneuropathy, unspecified: Secondary | ICD-10-CM

## 2021-06-29 DIAGNOSIS — M79671 Pain in right foot: Secondary | ICD-10-CM

## 2021-07-02 MED ORDER — ROSUVASTATIN CALCIUM 5 MG PO TABS: 5.0000 mg | ORAL_TABLET | Freq: Every day | ORAL | 3 refills | Status: DC

## 2021-07-03 ENCOUNTER — Ambulatory Visit (INDEPENDENT_AMBULATORY_CARE_PROVIDER_SITE_OTHER): Payer: Medicare Other | Admitting: Neurology

## 2021-07-03 ENCOUNTER — Encounter: Payer: Self-pay | Admitting: Neurology

## 2021-07-03 VITALS — BP 120/59 | HR 57 | Ht 66.0 in | Wt 179.4 lb

## 2021-07-03 DIAGNOSIS — M79671 Pain in right foot: Secondary | ICD-10-CM

## 2021-07-03 DIAGNOSIS — M545 Low back pain, unspecified: Secondary | ICD-10-CM | POA: Diagnosis not present

## 2021-07-03 DIAGNOSIS — R202 Paresthesia of skin: Secondary | ICD-10-CM | POA: Diagnosis not present

## 2021-07-03 DIAGNOSIS — R200 Anesthesia of skin: Secondary | ICD-10-CM

## 2021-07-03 DIAGNOSIS — M79672 Pain in left foot: Secondary | ICD-10-CM

## 2021-07-03 DIAGNOSIS — G8929 Other chronic pain: Secondary | ICD-10-CM

## 2021-07-03 NOTE — Progress Notes (Signed)
Subjective:  ?  ?Patient ID: Grant [name] is a 66 y.o. male. ? ?HPI ? ? ? ?Interim history:  ? ?Grant [name] is a 66 year old right-handed gentleman with an underlying medical history of hypertension, reflux disease, allergies, and overweight state, who presents for evaluation of his foot pain, concern for neuropathy.  He is referred by his primary care nurse practitioner, Jeralyn Ruths.  The referral also mentions memory loss as well as dizziness but he would like to concentrate on his feet today.  I last saw him on 05/14/21, at which time we talked about his sleep apnea and he presented for his first compliance visit.  He was still adjusting to treatment, overall 4-hour compliance was slightly suboptimal.  Average AHI was at goal at 3/h.  He felt stable on Neupro patch 1 mg strength once daily and ropinirole 0.25 mg as needed.  He had noticed some improvement in his daytime somnolence.  ? ?Today, 07/03/21: He reports several year history of numbness and tingling in both feet.  In the past 6 months he has had pain in the balls of his feet.  He has not fallen.  He is on his feet for work.  He is not having any numbness or tingling or burning sensation in his hands or fingers.  He was told in the past that he may have gout.  He has not seen a rheumatologist for this and is not on any treatment for gout.  He has not had any weakness.  His restless legs symptoms are stable.  Years ago he was on gabapentin, unclear dose and unclear indication but had side effects including suicidal ideations.  He also tried Lyrica after that which caused similar side effects, unclear what dose he was on and for what indication.  He has a longstanding history of low back pain, currently without sciatica type symptoms, no bowel or bladder dysfunction.   ?He had blood work his primary care recently, in January 2023 his TSH was normal at 1.58, on 06/07/2021 his vitamin B12 was 520, folate above 24.2, uric acid on 06/07/2021 was 4.3.  ? ?The patient's  allergies, current medications, family history, past medical history, past social history, past surgical history and problem list were reviewed and updated as appropriate.  ?  ?Previously (copied from previous notes for reference):  ? ?I saw him on 07/15/2017, at which time he was kept on ropinirole for his restless legs. ?He saw Gilford Raid, NP on 11/27/2017. ?He saw Debbora Presto, NP on 04/01/2019. ?He saw Lomax, NP again on 07/01/2019.  ?We started Neupro patch in July 2021.  ?He saw Debbora Presto, NP on 01/03/2020, at which time he was stable on his medication.   ?He saw Debbora Presto, NP on 01/02/2021, at which time he complained of daytime somnolence.  We proceeded with a home sleep test. He had a home sleep test on 02/19/2021 which indicated mild obstructive sleep apnea, AHI was 7/h, O2 nadir 81%.  He was advised to start AutoPap therapy.  Set up date was 03/09/2022. ?  ?I reviewed his compliance data for the past month, he used his machine 23 and 30 days with percent use days greater than 4 hours at 60%, slightly suboptimal compliance for more than 4 hours, average AHI at goal at 3/h, average pressure for the 95th percentile at 10.2 cm with a range of 5 to 11 cm.  Maximum pressure of 10.7 cm, leak on the higher side with the 95th percentile at 27.9  L/min.    ?  ?I saw him on 11/19/2016, at which time he was taking ropinirole 0.25 mg strength at 3 different times. He had recently suffered serious dog bites that became infected and needed treatment for this as an inpatient. I suggested cautious increase in his Requip 0.25 mg strength 2 pills 3 times a day. He was advised regarding augmentation.  ?  ?I first met him on 07/15/2016 at the request of his primary care physician, at which time the patient reported snoring and excessive daytime somnolence, difficulty falling asleep, sleep disruption. He was invited for sleep study. He had a baseline sleep study on 07/31/2016. I went over his test results with him in detail today.  Sleep efficiency was 76.7%, sleep latency 36 minutes, wake after sleep onset was 75 minutes with mild to moderate sleep fragmentation noted. He had an increased percentage of stage II sleep, slow-wave sleep at 16.9%, REM sleep was 15%. He had mild snoring. EKG showed normal sinus rhythm. Total AHI was 2.1 per hour, REM AHI was 14 per hour, supine AHI was 6.8 per hour. Average oxygen saturation was 95%, nadir was 85%. He had severe PLMS with an index of 65.6 per hour, associated with mild arousals of 9.2 per hour. ?  ?07/15/2016: He reports snoring and excessive daytime somnolence. I reviewed your office note from 06/17/2016. ?His Epworth sleepiness score is 16 out of 24, his fatigue score is 47 out of 63. He reports waking up with a sense of choking, difficulty falling asleep and sleep disruption. He estimates that he gets about 4-5 hours of sleep on any given day. He is married and lives with his wife. He has 4 children. He was a fairly heavy 1-2 pack per day smoker and quit in November 2017. He denies drinking alcohol or using illicit drugs. He denies utilizing caffeine on a day-to-day basis. ?He has been sleeping in a recliner for years, recently got a hospital twin bed and started sleeping in it, slightly elevated.  ?He had been on temazepam for about 3 years and recently weaned off of it.  ?He has had intermittent RLS symptoms. He has not been sleeping in the same bedroom with his wife due to both snoring.  ?He has had anxiety issues and has been taking OTC CBD oil.  ?Bedtime is between 11 and 12, he often takes melatonin for sleep. She denies night to night nocturia or morning headaches or family history of OSA. His wake up time is between 5:30 and 6 AM. He works part-time as an Clinical biochemist. He has a TV in the bedroom but does not rely on it staying on at night. They have one dog but the poodle does not sleep in his bedroom. ? ?His Past Medical History Is Significant For: ?Past Medical History:  ?Diagnosis  Date  ? Acid reflux   ? Allergy   ? Anemia   ? Anxiety   ? History of hiatal hernia   ? Hypertension   ? Insomnia   ? RLS (restless legs syndrome)   ? Sensorineural hearing loss   ? Von Willebrand disease ([place])   ? PT STATES THIS IS MILD AND HAS NEVER HAD TO SEE HEMATOLOGIST-PT DID SAY THAT WHEN [place] AGO HE BLED ALOT BUT IT WAS DUE TO NOT FOLLOWING POST OP INSTRUCTIONS   ? ? ?His Past Surgical History Is Significant For: ?Past Surgical History:  ?Procedure Laterality Date  ? CIRCUMCISION    ?  AS AN ADULT  ? COLONOSCOPY    ? FOOT SURGERY Right   ? arch support  ? HERNIA REPAIR    ? INCISION AND DRAINAGE Right 02/04/2013  ? Procedure: INCISION AND DRAINAGE;  Surgeon: Linna Hoff, MD;  Location: [facility];  Service: Orthopedics;  Laterality: Right;  ? INGUINAL HERNIA REPAIR Bilateral 05/13/2018  ? Procedure: LAPAROSCOPIC BILATERAL INGUINAL HERNIA REPAIR;  Surgeon: Olean Ree, MD;  Location: [facility];  Service: General;  Laterality: Bilateral;  ? INGUINAL HERNIA REPAIR Right 06/17/2019  ? Procedure: HERNIA REPAIR INGUINAL ADULT WITH MESH-- Recurrent;  Surgeon: Olean Ree, MD;  Location: [facility];  Service: General;  Laterality: Right;  ? OPEN REDUCTION INTERNAL FIXATION (ORIF) FINGER WITH RADIAL BONE GRAFT Right 02/04/2013  ? Procedure: OPEN REDUCTION INTERNAL FIXATION (ORIF) right ring and small fingers;  Surgeon: Linna Hoff, MD;  Location: [facility];  Service: Orthopedics;  Laterality: Right;  ? ? ?His Family History Is Significant For: ?Family History  ?Problem Relation Age of Onset  ? Hypertension Mother   ? Hyperlipidemia Mother   ? Hypertension Father   ? Hyperlipidemia Father   ? Stroke Father   ?     x2  ? Colon cancer Neg Hx   ? Esophageal cancer Neg Hx   ? Rectal cancer Neg Hx   ? Stomach cancer Neg Hx   ? Sleep apnea Neg Hx   ? Neuropathy Neg Hx   ? ? ?His Social History Is Significant For: ?Social History  ? ?Socioeconomic History  ? Marital status: Married  ?  Spouse name:  Not on file  ? Number of children: 4  ? Years of education: 23  ? Highest education level: Not on file  ?Occupational History  ? Occupation: Sales executive   ?Tobacco Use  ? Smoking status: Former  ?  P

## 2021-07-03 NOTE — Patient Instructions (Addendum)
It was nice to see you again today.  ? ?You may have a condition called peripheral neuropathy, i. e. nerve damage. Unfortunately, as I mentioned, there is no specific treatment for most neuropathies.  You have already tried gabapentin and Lyrica in the past and had side effects.  Most other medications that help with nerve pain can unfortunately exacerbate restless leg symptoms. ?The most common cause for neuropathy is diabetes in this country, in which case, tight glucose control is key.  Some studies suggest that obesity and prediabetes can also cause nerve damage even in the absence of a formal diagnosis of diabetes.  ?Other causes include thyroid disease, and some vitamin deficiencies. Certain medications such as chemotherapy agents and other chemicals or toxins including alcohol can cause neuropathy. There are some genetic conditions or hereditary neuropathies. Typically patients will report a family history of neuropathy in those conditions. There are cases associated with cancers and autoimmune conditions. Most neuropathies are progressive unless a root cause can be found and treated, which is rare, as I explained. For most neuropathies there is no actual cure or reversing of symptoms. Painful neuropathy can be difficult to treat symptomatically, but there are some medications available to ease the symptoms.  Thankfully, you have no significant pain symptoms at this time and can be monitored for symptoms.   ? ?Electrophysiologic testing with nerve conduction velocity studies and EMG (muscle testing) do not always pick up neuropathies that affect the smallest fibers. Other common tests include different type of blood work, and rarely, spinal fluid testing, and sometimes we resort to asking for a nerve and muscle biopsy. ? ?We can also consider specialist input from a neuromuscular specialist, sometimes we make referrals to [facility] or [facility] or [facility]. ? ?For now, as discussed, we will proceed with  further work-up from my end of things: We will check blood work today and call you with the test results. ? ?You have chronic low back pain and have not had a recent MRI of the lumbar spine, I will order an MRI and we will call you with the results.  This test does need insurance authorization which we will request first, before scheduling.   ? ?For now, we will keep you posted as to your test results by phone call. ?

## 2021-07-04 ENCOUNTER — Telehealth: Payer: Self-pay | Admitting: Neurology

## 2021-07-04 NOTE — Telephone Encounter (Signed)
Medicare/tricare order sent to [facility], [facility] they will reach out to the patient to schedule.  °

## 2021-07-05 ENCOUNTER — Encounter: Admitting: Nurse Practitioner

## 2021-07-06 ENCOUNTER — Telehealth: Payer: Self-pay

## 2021-07-06 ENCOUNTER — Encounter: Payer: Self-pay | Admitting: Nurse Practitioner

## 2021-07-06 NOTE — Telephone Encounter (Signed)
Patient made aware of zio results with verbalized understanding. ?

## 2021-07-06 NOTE — Telephone Encounter (Signed)
-----   Message from Wellington Hampshire, MD sent at 07/06/2021  2:54 PM EDT ----- ?Inform patient that outpatient monitor showed only short runs of supraventricular tachycardia.  These are not significant and do not require treatment.  Most of his reported symptoms while he was wearing the monitor did not correlate with arrhythmia. ?

## 2021-07-07 ENCOUNTER — Ambulatory Visit
Admission: RE | Admit: 2021-07-07 | Discharge: 2021-07-07 | Disposition: A | Discharge status: Home / Self Care | Payer: Medicare Other | Source: Ambulatory Visit | Attending: Neurology | Admitting: Neurology

## 2021-07-07 ENCOUNTER — Other Ambulatory Visit: Payer: Self-pay | Admitting: Neurology

## 2021-07-07 DIAGNOSIS — M79671 Pain in right foot: Secondary | ICD-10-CM

## 2021-07-07 DIAGNOSIS — G8929 Other chronic pain: Secondary | ICD-10-CM

## 2021-07-07 DIAGNOSIS — M545 Low back pain, unspecified: Secondary | ICD-10-CM

## 2021-07-07 DIAGNOSIS — Z77018 Contact with and (suspected) exposure to other hazardous metals: Secondary | ICD-10-CM

## 2021-07-07 DIAGNOSIS — R200 Anesthesia of skin: Secondary | ICD-10-CM

## 2021-07-09 LAB — MULTIPLE MYELOMA PANEL, SERUM
Albumin SerPl Elph-Mcnc: 4 g/dL (ref 2.9–4.4)
Albumin/Glob SerPl: 1.5 (ref 0.7–1.7)
Alpha 1: 0.2 g/dL (ref 0.0–0.4)
Alpha2 Glob SerPl Elph-Mcnc: 0.7 g/dL (ref 0.4–1.0)
B-Globulin SerPl Elph-Mcnc: 1 g/dL (ref 0.7–1.3)
Gamma Glob SerPl Elph-Mcnc: 0.8 g/dL (ref 0.4–1.8)
Globulin, Total: 2.7 g/dL (ref 2.2–3.9)
IgA/Immunoglobulin A, Serum: 191 mg/dL (ref 61–437)
IgG (Immunoglobin G), Serum: 835 mg/dL (ref 603–1613)
IgM (Immunoglobulin M), Srm: 72 mg/dL (ref 20–172)

## 2021-07-09 LAB — COMPREHENSIVE METABOLIC PANEL
ALT: 41 IU/L (ref 0–44)
AST: 35 IU/L (ref 0–40)
Albumin/Globulin Ratio: 2.4 — ABNORMAL HIGH (ref 1.2–2.2)
Albumin: 4.7 g/dL (ref 3.8–4.8)
Alkaline Phosphatase: 122 IU/L — ABNORMAL HIGH (ref 44–121)
BUN/Creatinine Ratio: 16 (ref 10–24)
BUN: 14 mg/dL (ref 8–27)
Bilirubin Total: 0.4 mg/dL (ref 0.0–1.2)
CO2: 23 mmol/L (ref 20–29)
Calcium: 9.5 mg/dL (ref 8.6–10.2)
Chloride: 103 mmol/L (ref 96–106)
Creatinine, Ser: 0.86 mg/dL (ref 0.76–1.27)
Globulin, Total: 2 g/dL (ref 1.5–4.5)
Glucose: 113 mg/dL — ABNORMAL HIGH (ref 70–99)
Potassium: 4.1 mmol/L (ref 3.5–5.2)
Sodium: 142 mmol/L (ref 134–144)
Total Protein: 6.7 g/dL (ref 6.0–8.5)
eGFR: 95 mL/min/{1.73_m2} (ref >59)

## 2021-07-09 LAB — ANA W/REFLEX: Anti Nuclear Antibody (ANA): NEGATIVE

## 2021-07-09 LAB — VITAMIN B6: Vitamin B6: 15 ug/L (ref 3.4–65.2)

## 2021-07-09 LAB — HEAVY METALS PROFILE II, BLOOD
Arsenic: 2 ug/L (ref 0–9)
Cadmium: <0.5 ug/L (ref 0.0–1.2)
Lead, Blood: <1 ug/dL (ref 0.0–3.4)
Mercury: <1 ug/L (ref 0.0–14.9)

## 2021-07-09 LAB — VITAMIN B1: Thiamine: 138.6 nmol/L (ref 66.5–200.0)

## 2021-07-09 LAB — HGB A1C W/O EAG: Hgb A1c MFr Bld: 5.6 % (ref 4.8–5.6)

## 2021-07-09 LAB — CK: Total CK: 253 U/L (ref 41–331)

## 2021-07-09 LAB — RPR: RPR Ser Ql: NONREACTIVE

## 2021-07-09 LAB — SEDIMENTATION RATE: Sed Rate: 4 mm/hr (ref 0–30)

## 2021-07-10 ENCOUNTER — Other Ambulatory Visit: Payer: Medicare Other

## 2021-07-10 ENCOUNTER — Ambulatory Visit
Admission: RE | Admit: 2021-07-10 | Discharge: 2021-07-10 | Disposition: A | Discharge status: Home / Self Care | Payer: Medicare Other | Source: Ambulatory Visit | Attending: Neurology | Admitting: Neurology

## 2021-07-10 DIAGNOSIS — Z77018 Contact with and (suspected) exposure to other hazardous metals: Secondary | ICD-10-CM

## 2021-07-11 ENCOUNTER — Ambulatory Visit
Admission: RE | Admit: 2021-07-11 | Discharge: 2021-07-11 | Disposition: A | Discharge status: Home / Self Care | Payer: Medicare Other | Source: Ambulatory Visit | Attending: Neurology | Admitting: Neurology

## 2021-07-11 DIAGNOSIS — R200 Anesthesia of skin: Secondary | ICD-10-CM | POA: Diagnosis not present

## 2021-07-18 ENCOUNTER — Ambulatory Visit: Payer: Medicare Other | Admitting: Neurology

## 2021-07-22 ENCOUNTER — Encounter: Payer: Self-pay | Admitting: Neurology

## 2021-07-23 NOTE — Telephone Encounter (Signed)
I discussed Grant [name] symptoms with Dr. Krista Blue who kindly agreed to provide an opinion, he may benefit from a skin biopsy versus EMG and nerve conduction velocity testing.  Please notify patient and offer appointment with Dr. Krista Blue. ?

## 2021-07-24 ENCOUNTER — Ambulatory Visit (INDEPENDENT_AMBULATORY_CARE_PROVIDER_SITE_OTHER): Payer: Medicare Other | Admitting: Neurology

## 2021-07-24 ENCOUNTER — Encounter: Payer: Self-pay | Admitting: Neurology

## 2021-07-24 VITALS — BP 131/65 | HR 49 | Ht 69.0 in | Wt 183.0 lb

## 2021-07-24 DIAGNOSIS — G629 Polyneuropathy, unspecified: Secondary | ICD-10-CM | POA: Diagnosis not present

## 2021-07-24 MED ORDER — DULOXETINE HCL 60 MG PO CPEP: 60.0000 mg | ORAL_CAPSULE | Freq: Every day | ORAL | 6 refills | Status: DC

## 2021-07-24 MED ORDER — DICLOFENAC SODIUM 1.5 % EX SOLN: CUTANEOUS | 11 refills | Status: DC

## 2021-07-24 MED ORDER — LIDOCAINE-PRILOCAINE 2.5-2.5 % EX CREA: TOPICAL_CREAM | CUTANEOUS | 11 refills | Status: DC

## 2021-07-24 NOTE — Patient Instructions (Signed)
Meds ordered this encounter  ?Medications  ? DULoxetine (CYMBALTA) 60 MG capsule  after meal  ?  Sig: Take 1 capsule (60 mg total) by mouth daily.  ?  Dispense:  30 capsule  ?  Refill:  6  ? lidocaine-prilocaine (EMLA) cream  ?  Sig: 1 gram qid prn  ?  Dispense:  30 g  ?  Refill:  11  ? Diclofenac Sodium 1.5 % SOLN  ?  Sig: 2 gram qid prn  ?  Dispense:  150 mL  ?  Refill:  11  ?   ?

## 2021-07-24 NOTE — Progress Notes (Addendum)
Chief Complaint  Patient presents with   Follow-up    Rm 15. Alone. second opinion on testing for neuropathy symptoms; pt of Dr Rexene Alberts.      ASSESSMENT AND PLAN  Grant [name] is a 66 y.o. male   Painful small fiber neuropathy  Mildly length-dependent sensory changes, hairline receding to distal shin level,  More extensive laboratory evaluation to rule out treatable etiology, B12, SSA/B antibody, paraneoplastic antibody  Could not tolerate gabapentin, Lyrica in the past,  Will try Cymbalta 60 mg in the morning, the other option are lamotrigine, Effexor,  Emla gel, may combine with diclofenac gel,  Skin biopsy   DIAGNOSTIC DATA (LABS, IMAGING, TESTING) - I reviewed patient records, labs, notes, testing and imaging myself where available.   MEDICAL HISTORY:  Grant [name] is a 66 year old male, seen in request by my colleague Dr. Rexene Alberts for evaluation of peripheral neuropathy, initially evaluation was on Jul 24, 2021  I reviewed and summarized the referring note. PMHX HTN HLD GERD Von Willebrand Disease--post surgery hemorrhage. Obstructive sleep apnea, using CPAP  He began to noticed bilateral feet numbness since 2019, numbness tingling mainly involving plantar feet, toes, gradually getting worse, began to ascending to ankle level, he felt constant swelling sensation of his feet, also began to feel burning painful since 2022, sometimes sharp radiating pain brings him to tears, normally at 2 out of 10,  He has tried gabapentin and Lyrica in the past, which seems to help his symptoms but developed suicidal ideas  He denies significant depression, he does electronic work, still able to climbing up ladders without much difficulty, he does have low back pain,  He also complains of restless leg symptoms since 2021, urged to move his leg when sitting still trying to go to bed, he denies history of smoking, or alcohol use  His bilateral feet symptoms has become so bothersome for  him since 2022, he can barely get through his day sometimes, is too painful left prolonged walking  He is now taking neuro patch, Requip 0.25 mg 2 tablets every night, which has helped his restless leg syndrome some,  He had extensive laboratory evaluation in 2023, normal B1, protein electrophoresis, ESR, C-reactive protein, B6, ANA, CPK, heavy metal, A1c, CMP, CBC, uric acid, TSH,   PHYSICAL EXAM:   Vitals:   07/24/21 0858  BP: 131/65  Pulse: (!) 49  Weight: 183 lb (83 kg)  Height: 5' 9" (1.753 m)   Not recorded     Body mass index is 27.02 kg/m.  PHYSICAL EXAMNIATION:  Gen: NAD, conversant, well nourised, well groomed                     Cardiovascular: Regular rate rhythm, no peripheral edema, warm, nontender. Eyes: Conjunctivae clear without exudates or hemorrhage Neck: Supple, no carotid bruits. Pulmonary: Clear to auscultation bilaterally   NEUROLOGICAL EXAM:  MENTAL STATUS: Speech/cognition: Awake, alert, oriented to history taking and casual conversation CRANIAL NERVES: CN II: Visual fields are full to confrontation. Pupils are round equal and briskly reactive to light. CN III, IV, VI: extraocular movement are normal. No ptosis. CN V: Facial sensation is intact to light touch CN VII: Face is symmetric with normal eye closure  CN VIII: Hearing is normal to causal conversation. CN IX, X: Phonation is normal. CN XI: Head turning and shoulder shrug are intact  MOTOR: There is no pronator drift of out-stretched arms. Muscle bulk and tone are normal. Muscle strength is  normal.  REFLEXES: Reflexes are 2+ and symmetric at the biceps, triceps, knees, and trace at ankles. Plantar responses are flexor.  SENSORY: Mild erythematous discoloration of toes, slight length-dependent decreased light touch pinprick to mid foot level, preserved toe vibratory and proprioception, hairline receding to distal shin level  COORDINATION: There is no trunk or limb dysmetria  noted.  GAIT/STANCE: Posture is normal. Gait is steady with normal steps, base, arm swing, and turning. Heel and toe walking are normal. Tandem gait is normal.  Romberg is absent.  REVIEW OF SYSTEMS:  Full 14 system review of systems performed and notable only for as above All other review of systems were negative.   ALLERGIES: Allergies  Allergen Reactions   Bee Venom Anaphylaxis, Swelling and Rash   Asa [Aspirin] Other (See Comments)    Gi upset   Atorvastatin Other (See Comments)    Joint pain   Zetia [Ezetimibe] Other (See Comments)    Joint pain    HOME MEDICATIONS: Current Outpatient Medications  Medication Sig Dispense Refill   amLODipine (NORVASC) 5 MG tablet Take 1 tablet (5 mg total) by mouth daily. 90 tablet 1   EPINEPHRINE 0.3 mg/0.3 mL IJ SOAJ injection INJECT 0.3 ML (0.3 MG) INTO THE MUSCLE ONCE FOR 1 DOSE 2 each 11   losartan (COZAAR) 50 MG tablet Take 1 tablet (50 mg total) by mouth daily. 90 tablet 1   pantoprazole (PROTONIX) 40 MG tablet TAKE 1 TABLET DAILY 90 tablet 3   rOPINIRole (REQUIP) 0.25 MG tablet Take 1 tablet (0.25 mg total) by mouth in the morning and at bedtime. 180 tablet 3   rosuvastatin (CRESTOR) 5 MG tablet Take 1 tablet (5 mg total) by mouth daily. 90 tablet 3   Rotigotine (NEUPRO) 1 MG/24HR PT24 Place 1 patch (1 mg total) onto the skin daily at 4 PM. 90 patch 3   triamcinolone cream (KENALOG) 0.1 % Apply 1 application. topically 2 (two) times daily. 30 g 0   No current facility-administered medications for this visit.    PAST MEDICAL HISTORY: Past Medical History:  Diagnosis Date   Acid reflux    Allergy    Anemia    Anxiety    History of hiatal hernia    Hypertension    Insomnia    RLS (restless legs syndrome)    Sensorineural hearing loss    Von Willebrand disease ([place])    PT STATES THIS IS MILD AND HAS NEVER HAD TO SEE HEMATOLOGIST-PT DID SAY THAT WHEN HE WAS [place] AGO HE BLED ALOT BUT IT WAS DUE TO NOT  FOLLOWING POST OP INSTRUCTIONS     PAST SURGICAL HISTORY: Past Surgical History:  Procedure Laterality Date   CIRCUMCISION     AS AN ADULT   COLONOSCOPY     FOOT SURGERY Right    arch support   HERNIA REPAIR     INCISION AND DRAINAGE Right 02/04/2013   Procedure: INCISION AND DRAINAGE;  Surgeon: Linna Hoff, MD;  Location: [facility];  Service: Orthopedics;  Laterality: Right;   INGUINAL HERNIA REPAIR Bilateral 05/13/2018   Procedure: LAPAROSCOPIC BILATERAL INGUINAL HERNIA REPAIR;  Surgeon: Olean Ree, MD;  Location: [facility];  Service: General;  Laterality: Bilateral;   INGUINAL HERNIA REPAIR Right 06/17/2019   Procedure: HERNIA REPAIR INGUINAL ADULT WITH MESH-- Recurrent;  Surgeon: Olean Ree, MD;  Location: [facility];  Service: General;  Laterality: Right;   OPEN REDUCTION INTERNAL FIXATION (ORIF) FINGER WITH RADIAL BONE GRAFT Right  02/04/2013   Procedure: OPEN REDUCTION INTERNAL FIXATION (ORIF) right ring and small fingers;  Surgeon: Linna Hoff, MD;  Location: [facility];  Service: Orthopedics;  Laterality: Right;    FAMILY HISTORY: Family History  Problem Relation Age of Onset   Hypertension Mother    Hyperlipidemia Mother    Hypertension Father    Hyperlipidemia Father    Stroke Father        x2   Colon cancer Neg Hx    Esophageal cancer Neg Hx    Rectal cancer Neg Hx    Stomach cancer Neg Hx    Sleep apnea Neg Hx    Neuropathy Neg Hx     SOCIAL HISTORY: Social History   Socioeconomic History   Marital status: Married    Spouse name: Not on file   Number of children: 4   Years of education: 12   Highest education level: Not on file  Occupational History   Occupation: Sales executive   Tobacco Use   Smoking status: Former    Packs/day: 0.50    Years: 40.00    Pack years: 20.00    Types: Cigarettes    Quit date: 05/10/2016    Years since quitting: 5.2   Smokeless tobacco: Never  Vaping Use   Vaping Use: Former   Quit date: 05/10/2016  Substance and Sexual  Activity   Alcohol use: No   Drug use: Yes    Types: Marijuana    Comment: uses daily   Sexual activity: Not on file  Other Topics Concern   Not on file  Social History Narrative   Denies caffeine use    Social Determinants of Health   Financial Resource Strain: Not on file  Food Insecurity: Not on file  Transportation Needs: Not on file  Physical Activity: Not on file  Stress: Not on file  Social Connections: Not on file  Intimate Partner Violence: Not on file      Marcial Pacas, M.D. Ph.D.  [facility] [address], [place], [place] [address] Ph: [phone] Fax: [phone]  CC:  Ailene Ards, NP [address] [place],  [place] [address]  Ailene Ards, NP

## 2021-07-26 ENCOUNTER — Encounter: Payer: Self-pay | Admitting: Nurse Practitioner

## 2021-07-26 LAB — PARANEOPLASTIC AB
AGNA-1: NEGATIVE
Amphiphysin Antibody: NEGATIVE
Anti-Hu Ab: NEGATIVE
Anti-Ri Ab: NEGATIVE
Anti-Yo Ab: NEGATIVE
Antineruonal nuclear Ab Type 3: NEGATIVE
CASPR2 Antibody,Cell-based IFA: NEGATIVE
CRMP-5 IgG: NEGATIVE
Interpretation: NEGATIVE
LGI1 Antibody, Cell-based IFA: NEGATIVE
Purkinje Cell Cyto Ab Type 2: NEGATIVE
Purkinje Cell Cyto Ab Type Tr: NEGATIVE
VGCC Antibody: <1 pmol/L (ref 0.0–30.0)

## 2021-07-26 LAB — VITAMIN B12: Vitamin B-12: 539 pg/mL (ref 232–1245)

## 2021-07-26 LAB — SJOGRENS SYNDROME-A EXTRACTABLE NUCLEAR ANTIBODY: ENA SSA (RO) Ab: <0.2 AI (ref 0.0–0.9)

## 2021-07-26 LAB — SJOGRENS SYNDROME-B EXTRACTABLE NUCLEAR ANTIBODY: ENA SSB (LA) Ab: <0.2 AI (ref 0.0–0.9)

## 2021-07-30 ENCOUNTER — Ambulatory Visit: Payer: Medicare Other | Admitting: Neurology

## 2021-08-02 ENCOUNTER — Ambulatory Visit: Payer: Medicare Other | Admitting: Neurology

## 2021-08-02 ENCOUNTER — Ambulatory Visit (INDEPENDENT_AMBULATORY_CARE_PROVIDER_SITE_OTHER): Payer: Medicare Other | Admitting: Cardiovascular Disease

## 2021-08-02 ENCOUNTER — Encounter: Payer: Self-pay | Admitting: Cardiovascular Disease

## 2021-08-02 VITALS — BP 126/74 | HR 52 | Ht 66.0 in | Wt 182.0 lb

## 2021-08-02 DIAGNOSIS — I700 Atherosclerosis of aorta: Secondary | ICD-10-CM | POA: Diagnosis not present

## 2021-08-02 DIAGNOSIS — R0989 Other specified symptoms and signs involving the circulatory and respiratory systems: Secondary | ICD-10-CM | POA: Diagnosis not present

## 2021-08-02 DIAGNOSIS — E785 Hyperlipidemia, unspecified: Secondary | ICD-10-CM | POA: Diagnosis not present

## 2021-08-02 DIAGNOSIS — R001 Bradycardia, unspecified: Secondary | ICD-10-CM | POA: Diagnosis not present

## 2021-08-02 NOTE — Progress Notes (Signed)
Cardiology Office Note   Date:  08/02/2021   ID:  Rocket, Gunderson 1956/01/12, MRN [id number]  PCP:  Ailene Ards, NP  Cardiologist:   Kathlyn Sacramento, MD   Chief Complaint  Patient presents with   Follow-up    F/U after wearing ZIO monitor. Patient states he was unable to wear it for the full 14 days because it fell off and the adhesive would not allow him to reapply it.      History of Present Illness: Grant [name] is a 66 y.o. male who is here today for follow-up visit regarding coronary artery disease and hyperlipidemia.  He has known history of essential hypertension, hyperlipidemia with intolerance to statins, previous tobacco use, COPD, GERD and sleep apnea on CPAP.  He had CT scan of the lungs in April of last year which showed evidence of three-vessel coronary artery calcifications as well as aortic calcifications.CT scan of the abdomen in 2020 showed no evidence of abdominal aortic aneurysm.  He quit smoking in 2018 but uses marijuana on a regular basis. The patient was seen earlier this year for chest pain with some exertional component. He underwent cardiac CTA in February which showed a calcium score of 122.  There was mild to moderate LAD disease .  CT FFR was negative. Carotid Doppler showed normal carotid arteries.  He had some palpitations and thus had 2-week ZIO monitor which showed sinus rhythm with average heart rate of 61 bpm.  He had short runs of SVT the longest lasted only 7 beats.  Most triggered events did not correlate with arrhythmia. He is tolerating small dose rosuvastatin.  He has been doing reasonably well with no recurrent chest pain or shortness of breath.  Past Medical History:  Diagnosis Date   Acid reflux    Allergy    Anemia    Anxiety    History of hiatal hernia    Hypertension    Insomnia    RLS (restless legs syndrome)    Sensorineural hearing loss    Von Willebrand disease ([place])    PT STATES THIS IS MILD AND HAS NEVER HAD TO SEE  HEMATOLOGIST-PT DID SAY THAT WHEN [place] HE BLED ALOT BUT IT WAS DUE TO NOT FOLLOWING POST OP INSTRUCTIONS     Past Surgical History:  Procedure Laterality Date   CIRCUMCISION     AS AN ADULT   COLONOSCOPY     FOOT SURGERY Right    arch support   HERNIA REPAIR     INCISION AND DRAINAGE Right 02/04/2013   Procedure: INCISION AND DRAINAGE;  Surgeon: Linna Hoff, MD;  Location: [facility];  Service: Orthopedics;  Laterality: Right;   INGUINAL HERNIA REPAIR Bilateral 05/13/2018   Procedure: LAPAROSCOPIC BILATERAL INGUINAL HERNIA REPAIR;  Surgeon: Olean Ree, MD;  Location: [facility];  Service: General;  Laterality: Bilateral;   INGUINAL HERNIA REPAIR Right 06/17/2019   Procedure: HERNIA REPAIR INGUINAL ADULT WITH MESH-- Recurrent;  Surgeon: Olean Ree, MD;  Location: [facility];  Service: General;  Laterality: Right;   OPEN REDUCTION INTERNAL FIXATION (ORIF) FINGER WITH RADIAL BONE GRAFT Right 02/04/2013   Procedure: OPEN REDUCTION INTERNAL FIXATION (ORIF) right ring and small fingers;  Surgeon: Linna Hoff, MD;  Location: [facility];  Service: Orthopedics;  Laterality: Right;     Current Outpatient Medications  Medication Sig Dispense Refill   amLODipine (NORVASC) 5 MG tablet Take 1 tablet (5 mg total) by  mouth daily. 90 tablet 1   Diclofenac Sodium 1.5 % SOLN 2 gram qid prn 150 mL 11   DULoxetine (CYMBALTA) 60 MG capsule Take 1 capsule (60 mg total) by mouth daily. 30 capsule 6   EPINEPHRINE 0.3 mg/0.3 mL IJ SOAJ injection INJECT 0.3 ML (0.3 MG) INTO THE MUSCLE ONCE FOR 1 DOSE 2 each 11   lidocaine-prilocaine (EMLA) cream 1 gram qid prn 30 g 11   losartan (COZAAR) 50 MG tablet Take 1 tablet (50 mg total) by mouth daily. 90 tablet 1   pantoprazole (PROTONIX) 40 MG tablet TAKE 1 TABLET DAILY 90 tablet 3   rOPINIRole (REQUIP) 0.25 MG tablet Take 1 tablet (0.25 mg total) by mouth in the morning and at bedtime. 180 tablet 3   rosuvastatin (CRESTOR) 5 MG tablet  Take 1 tablet (5 mg total) by mouth daily. 90 tablet 3   Rotigotine (NEUPRO) 1 MG/24HR PT24 Place 1 patch (1 mg total) onto the skin daily at 4 PM. 90 patch 3   triamcinolone cream (KENALOG) 0.1 % Apply 1 application. topically 2 (two) times daily. 30 g 0   No current facility-administered medications for this visit.    Allergies:   Bee venom, Asa [aspirin], Atorvastatin, and Zetia [ezetimibe]    Social History:  The patient  reports that he quit smoking about 5 years ago. His smoking use included cigarettes. He has a 20.00 pack-year smoking history. He has never used smokeless tobacco. He reports current drug use. Drug: Marijuana. He reports that he does not drink alcohol.   Family History:  The patient's family history includes Hyperlipidemia in his father and mother; Hypertension in his father and mother; Stroke in his father.    ROS:  Please see the history of present illness.   Otherwise, review of systems are positive for none.   All other systems are reviewed and negative.    PHYSICAL EXAM: VS:  There were no vitals taken for this visit. , BMI There is no height or weight on file to calculate BMI. GEN: Well nourished, well developed, in no acute distress  HEENT: normal  Neck: no JVD,  or masses.  Right carotid bruit. Cardiac: RRR; no murmurs, rubs, or gallops,no edema  Respiratory:  clear to auscultation bilaterally, normal work of breathing GI: soft, nontender, nondistended, + BS MS: no deformity or atrophy  Skin: warm and dry, no rash Neuro:  Strength and sensation are intact Psych: euthymic mood, full affect Vascular: Femoral pulses +2 bilaterally.  Posterior tibialis +2 bilaterally.  Dorsalis pedis is diminished on both sides.   EKG:  EKG is ordered today. EKG showed sinus bradycardia with no significant ST or T wave changes.   Recent Labs: 03/26/2021: TSH 1.58 06/07/2021: Hemoglobin 14.3; Platelets 253.0 07/03/2021: ALT 41; BUN 14; Creatinine, Ser 0.86; Potassium 4.1;  Sodium 142    Lipid Panel    Component Value Date/Time   CHOL 152 04/24/2021 0744   TRIG 56 04/24/2021 0744   HDL 43 04/24/2021 0744   CHOLHDL 3.5 04/24/2021 0744   CHOLHDL 4.5 10/25/2019 0817   VLDL 10 06/17/2016 0945   LDLCALC 97 04/24/2021 0744   LDLCALC 112 (H) 10/25/2019 0817      Wt Readings from Last 3 Encounters:  07/24/21 183 lb (83 kg)  07/03/21 179 lb 6.4 oz (81.4 kg)  06/21/21 179 lb (81.2 kg)          View : No data to display.  ASSESSMENT AND PLAN:  1.  Mild nonobstructive coronary artery disease: Currently with no anginal symptoms.  Continue medical therapy.  His coronary calcium score was 122 which is below the threshold of 300 above which is not considered CAD equivalent disease.  2.  Right carotid bruit: Normal carotid arteries by Doppler.  3.  Aortic atherosclerosis, no claudication.  4.  Hyperlipidemia: He was seen by Dr. Debara Pickett and currently tolerating small dose rosuvastatin.  5.  Palpitations: Outpatient monitor showed only short runs of SVT.  He has baseline bradycardia so I do not recommend any medications for this.    Disposition:   FU with me in 1 year  Signed,  Kathlyn Sacramento, MD  08/02/2021 9:05 AM    [place]

## 2021-08-02 NOTE — Patient Instructions (Signed)

## 2021-08-04 ENCOUNTER — Encounter: Payer: Self-pay | Admitting: Internal Medicine

## 2021-08-04 ENCOUNTER — Encounter: Payer: Self-pay | Admitting: Neurology

## 2021-08-13 ENCOUNTER — Other Ambulatory Visit: Payer: Self-pay

## 2021-08-13 MED ORDER — LIDOCAINE-PRILOCAINE 2.5-2.5 % EX CREA: TOPICAL_CREAM | CUTANEOUS | 11 refills | Status: DC

## 2021-08-13 NOTE — Progress Notes (Signed)
Rx filled with [facility].

## 2021-08-14 ENCOUNTER — Encounter: Payer: Self-pay | Admitting: Neurology

## 2021-08-14 ENCOUNTER — Ambulatory Visit (INDEPENDENT_AMBULATORY_CARE_PROVIDER_SITE_OTHER): Payer: Medicare Other | Admitting: Neurology

## 2021-08-14 VITALS — BP 126/69 | HR 64 | Ht 69.0 in | Wt 177.0 lb

## 2021-08-14 DIAGNOSIS — M79671 Pain in right foot: Secondary | ICD-10-CM

## 2021-08-14 DIAGNOSIS — R202 Paresthesia of skin: Secondary | ICD-10-CM | POA: Diagnosis not present

## 2021-08-14 DIAGNOSIS — M79672 Pain in left foot: Secondary | ICD-10-CM | POA: Diagnosis not present

## 2021-08-14 DIAGNOSIS — R200 Anesthesia of skin: Secondary | ICD-10-CM

## 2021-08-14 NOTE — Patient Instructions (Signed)
Grant [name]. Doran Durand, MD [email or web address] Address: [address], [place], [place] [address] Phone: [phone]

## 2021-08-15 ENCOUNTER — Encounter: Payer: Self-pay | Admitting: Neurology

## 2021-08-17 ENCOUNTER — Ambulatory Visit: Payer: Medicare Other | Admitting: Nurse Practitioner

## 2021-08-19 NOTE — Progress Notes (Signed)
Patient was in left lateral recombinant position. Sterile technique. 1% lidocaine with epinephrine was used for local anesthesia. Punctuated skin biopsy was performed. 3 mm skin sample were obtained at at right foot, above right extensor digitorum brevis, and right lateral calf, 10 cm above lateral malleolus, lateral thigh, 20 cm below superior iliac spine.  Patient tolerated the procedure well.  The wound was covered with neosporin antibiotic cream and bandage. 

## 2021-08-22 ENCOUNTER — Telehealth: Payer: Self-pay | Admitting: Neurology

## 2021-08-22 NOTE — Telephone Encounter (Signed)
Referral for Orthopedics sent to [facility] [phone]. 

## 2021-08-28 NOTE — Progress Notes (Deleted)
No chief complaint on file.  HISTORY OF PRESENT ILLNESS:  08/28/21 ALL: Grant [name] returns for follow up. He was last seen by me 12/2020 for RLS and EDS with ESS 14/24. He wished to repeat sleep study. HST 02/19/2021 showed mild OSA with AHI 7/hr and O2 nadir 81%. autoPAP advised. He was seen for initial compliance review with Dr Rexene Alberts 05/2021. Compliance was sub optimal at 60% and had been limited by respiratory illness. He was encouraged to continue therapy. He called reporting new dizziness and feeling lightheaded 05/2021 and was advised to follow up with PCP/cardiology. New referral sent 06/2021 for bilateral foot pain. He was seen by Dr Rexene Alberts again 07/03/2021 for several year history of numbness and tingling of bilateral feet. MRI lumbar spine and lab eval unremarkable. He had failed gabapentin and Lyrica in the past. He was referred to Dr Krista Blue for additional workup. He was seen 07/24/2021 and started on duloxetine '60mg'$  as well and Emla and diclofenac gel. He was unable to tolerate duloxetine but did note relief with topical creams. Skin biopsy 08/14/2021.   Since, he has continued Neupro patch and ropinirole '2mg'$  QHS. RLS is   01/02/2021 ALL: Grant [name] returns for follow up for PLMD. He continues Neupro patch '1mg'$  daily and ropinirole 0.'25mg'$  1-2 times daily. He reports significant improvement in restless leg symptoms. He is sleeping better but does continue to wake up multiple times during the night. No obvious etiology. He monitors sleep on a Fitbit like device. He sleeps for about 5-6 hours a night. Device records that he is getting about 20% REM sleep. He feels that he continues to have excessive daytime sleepiness. He states that sleepiness waxes and wanes. ESS score is 16/24 today and he feels this is pretty accurate reflexion of his sleepiness. He has a hard time staying awake if sitting still and states he has felt like he was going to nod off while driving on occasion. He has never actually fallen asleep  while driving. Split night study in 2018 that showed overall normal AHI with mild OSA in supine and REM sleep. He drinks about 2 cans of green tea daily. He is drinking 4-6 bottles of water daily. He feels decreased caffeine helps with RLS but he is more sleepy. He does wake up 2-3 times a night, usually to use restroom. Most of the time he can get back to sleep but sometimes it can take about an hour. He took melatonin several years ago but wasn't sure it helped but admits that he was having more difficulty with RLS then. He reports getting addicted to "sleep meds" with previous PCP. He thinks it was a hypnotic.   01/03/2020 ALL:  Grant [name] is a 66 y.o. male here today for follow up for PLMD and RLS. He was switched to Neupro patch in 10/2019. He continues ropinirole 0.'25mg'$  BID as needed. He has adjusted well to using patch. He feels daytime sleepiness has improved. He was started back on gabapentin with GI, however, he did not tolerate it well. He reports more depression when taking it. He has discontinued it and feeling well.    HISTORY (copied from my note on 07/01/2019)  Grant [name] is a 66 y.o. male here today for follow up for RLS. Sleep study in 07/2016 showed normal AHI but severe PMLD. He was previously taking 13 tablets of 0.'25mg'$  ropinirole throughout the day. We swithced to extended release in 10/2018. He reports taking ropinirole XL 2 mg twice daily  for a couple of months. He continues to have leg twitching. He has tried multiple different dosing times.  Most recently, he has taken ropinirole ER 2 mg at bedtime as well as ropinirole IR 0.25 mg 1-2 times throughout the day.  He feels that this regimen has been most beneficial.  Total daily dosing of 2.5 mg.    HISTORY: (copied from my note on 04/01/2019)   Grant [name] is a 66 y.o. male here today for follow up. 3,3 2at 5, 2at 9 and 3 at bedtime . He continues to have trouble with restlessness and jerking of both legs. He is taking 3  tablets every morning, 3 at lunch, 2 around 5pm, 2 at 9pm and 3 at bedtime for a total of 13 tablets daily (3.'25mg'$ .) He has had dizziness for the past couple of years but does not feel it is related to ropinirole. No side effects noted after taking ropinirole. Has tried Lyrica and gabapentin in the past but reports that he did not feel well on these. He reports suicidal ideations with both.     HISTORY: (copied from [employer] note on 11/27/2017)   Grant [name] is a 66 year old right-handed gentleman with an underlying medical history of hypertension, reflux disease, allergies, and overweight state, who presents for follow up consultation of his restless legs and PLMS. The patient is unaccompanied today. I last saw him on 11/19/2016, at which time he was taking ropinirole 0.25 mg strength at 3 different times. He had recently suffered serious dog bites that became infected and needed treatment for this as an inpatient. I suggested cautious increase in his Requip 0.25 mg strength 2 pills 3 times a day. He was advised regarding augmentation.    Today, 07/15/2017 (all dictated new, as well as above notes, some dictation done in note pad or Word, outside of chart, may appear as copied):  He reports feeling about the same, maybe a little better. He takes requip 1 pill at 6 AM (2 pills was too much in AM), 1 to 2 at 4:30 and 2 at BT, which ranges from 10 PM to MN. Sometimes he has symptoms in his arms. Sx of RLS date back to several years ago. He may have tried gabapentin in the past for Back pain, not sure if he tolerated it. He would be willing to retry it.    UPDATE 9/19/2019CM  Grant [name], 66 year old male returns for follow-up with history of restless leg syndrome.  He claims that his restless legs are a little bit worse.  He thinks his Toprol is making his restless legs worse so he has stopped the medication and wants [place] to make a suggestion however in reviewing side effects of Toprol restless legs is not a  side effect.  His blood pressure is elevated in the office today at 156/61.  He was encouraged to follow-up with his primary care regarding his blood pressure.  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  His legs are more bothersome today because he had to climb a ladder.  He was started back on gabapentin after his last visit in May with Dr. Rexene Alberts, however patient states once he got to 300 mg he had suicidal idealizations and he cut it back to 100 mg.  He has not had further suicidal idealzations on that dose.  He currently takes Requip 0.'25mg'$  3 tablets twice a day.  He has a history of anemia in the past but his most recent CBC with hemoglobin  of 13.5.  He returns for reevaluation    REVIEW OF SYSTEMS: Out of a complete 14 system review of symptoms, the patient complains only of the following symptoms, RLS, daytime sleepiness, and all other reviewed systems are negative.  ESS: 16/24   ALLERGIES: Allergies  Allergen Reactions   Bee Venom Anaphylaxis, Swelling and Rash   Asa [Aspirin] Other (See Comments)    Gi upset   Atorvastatin Other (See Comments)    Joint pain   Duloxetine Other (See Comments)    GI upset   Zetia [Ezetimibe] Other (See Comments)    Joint pain     HOME MEDICATIONS: Outpatient Medications Prior to Visit  Medication Sig Dispense Refill   amLODipine (NORVASC) 5 MG tablet Take 1 tablet (5 mg total) by mouth daily. 90 tablet 1   Diclofenac Sodium 1.5 % SOLN 2 gram qid prn 150 mL 11   EPINEPHRINE 0.3 mg/0.3 mL IJ SOAJ injection INJECT 0.3 ML (0.3 MG) INTO THE MUSCLE ONCE FOR 1 DOSE 2 each 11   lidocaine-prilocaine (EMLA) cream 1 gram qid prn 30 g 11   losartan (COZAAR) 50 MG tablet Take 1 tablet (50 mg total) by mouth daily. 90 tablet 1   pantoprazole (PROTONIX) 40 MG tablet TAKE 1 TABLET DAILY 90 tablet 3   rOPINIRole (REQUIP) 0.25 MG tablet Take 1 tablet (0.25 mg total) by mouth in the morning and at bedtime. 180 tablet 3   rosuvastatin (CRESTOR) 5 MG tablet  Take 1 tablet (5 mg total) by mouth daily. 90 tablet 3   Rotigotine (NEUPRO) 1 MG/24HR PT24 Place 1 patch (1 mg total) onto the skin daily at 4 PM. 90 patch 3   triamcinolone cream (KENALOG) 0.1 % Apply 1 application. topically 2 (two) times daily. 30 g 0   No facility-administered medications prior to visit.     PAST MEDICAL HISTORY: Past Medical History:  Diagnosis Date   Acid reflux    Allergy    Anemia    Anxiety    History of hiatal hernia    Hypertension    Insomnia    RLS (restless legs syndrome)    Sensorineural hearing loss    Von Willebrand disease ([place])    PT STATES THIS IS MILD AND HAS NEVER HAD TO SEE HEMATOLOGIST-PT DID SAY THAT WHEN [place] HE BLED ALOT BUT IT WAS DUE TO NOT FOLLOWING POST OP INSTRUCTIONS      PAST SURGICAL HISTORY: Past Surgical History:  Procedure Laterality Date   CIRCUMCISION     AS AN ADULT   COLONOSCOPY     FOOT SURGERY Right    arch support   HERNIA REPAIR     INCISION AND DRAINAGE Right 02/04/2013   Procedure: INCISION AND DRAINAGE;  Surgeon: Linna Hoff, MD;  Location: [facility];  Service: Orthopedics;  Laterality: Right;   INGUINAL HERNIA REPAIR Bilateral 05/13/2018   Procedure: LAPAROSCOPIC BILATERAL INGUINAL HERNIA REPAIR;  Surgeon: Olean Ree, MD;  Location: [facility];  Service: General;  Laterality: Bilateral;   INGUINAL HERNIA REPAIR Right 06/17/2019   Procedure: HERNIA REPAIR INGUINAL ADULT WITH MESH-- Recurrent;  Surgeon: Olean Ree, MD;  Location: [facility];  Service: General;  Laterality: Right;   OPEN REDUCTION INTERNAL FIXATION (ORIF) FINGER WITH RADIAL BONE GRAFT Right 02/04/2013   Procedure: OPEN REDUCTION INTERNAL FIXATION (ORIF) right ring and small fingers;  Surgeon: Linna Hoff, MD;  Location: [facility];  Service: Orthopedics;  Laterality: Right;  FAMILY HISTORY: Family History  Problem Relation Age of Onset   Hypertension Mother    Hyperlipidemia Mother    Hypertension  Father    Hyperlipidemia Father    Stroke Father        x2   Colon cancer Neg Hx    Esophageal cancer Neg Hx    Rectal cancer Neg Hx    Stomach cancer Neg Hx    Sleep apnea Neg Hx    Neuropathy Neg Hx      SOCIAL HISTORY: Social History   Socioeconomic History   Marital status: Married    Spouse name: Not on file   Number of children: 4   Years of education: 12   Highest education level: Not on file  Occupational History   Occupation: Sales executive   Tobacco Use   Smoking status: Former    Packs/day: 0.50    Years: 40.00    Total pack years: 20.00    Types: Cigarettes    Quit date: 05/10/2016    Years since quitting: 5.3   Smokeless tobacco: Never  Vaping Use   Vaping Use: Former   Quit date: 05/10/2016  Substance and Sexual Activity   Alcohol use: No   Drug use: Yes    Types: Marijuana    Comment: uses daily   Sexual activity: Not on file  Other Topics Concern   Not on file  Social History Narrative   Denies caffeine use    Social Determinants of Health   Financial Resource Strain: Not on file  Food Insecurity: Not on file  Transportation Needs: Not on file  Physical Activity: Not on file  Stress: Not on file  Social Connections: Not on file  Intimate Partner Violence: Not on file      PHYSICAL EXAM  There were no vitals filed for this visit.   There is no height or weight on file to calculate BMI.   Generalized: Well developed, in no acute distress   Neurological examination  Mentation: Alert oriented to time, place, history taking. Follows all commands speech and language fluent Cranial nerve II-XII: Pupils were equal round reactive to light. Extraocular movements were full, visual field were full on confrontational test. Facial sensation and strength were normal. Head turning and shoulder shrug  were normal and symmetric. Motor: The motor testing reveals 5 over 5 strength of all 4 extremities. Good symmetric motor tone is noted throughout.   Sensory: Sensory testing is intact to soft touch on all 4 extremities. No evidence of extinction is noted.  Coordination: Cerebellar testing reveals good finger-nose-finger and heel-to-shin bilaterally.  Gait and station: Gait is normal.  Reflexes: Deep tendon reflexes are symmetric and normal bilaterally.     DIAGNOSTIC DATA (LABS, IMAGING, TESTING) - I reviewed patient records, labs, notes, testing and imaging myself where available.  Lab Results  Component Value Date   WBC 6.0 06/07/2021   HGB 14.3 06/07/2021   HCT 42.3 06/07/2021   MCV 91.3 06/07/2021   PLT 253.0 06/07/2021      Component Value Date/Time   NA 142 07/03/2021 1535   K 4.1 07/03/2021 1535   CL 103 07/03/2021 1535   CO2 23 07/03/2021 1535   GLUCOSE 113 (H) 07/03/2021 1535   GLUCOSE 95 06/07/2021 0934   BUN 14 07/03/2021 1535   CREATININE 0.86 07/03/2021 1535   CREATININE 0.88 03/26/2021 1106   CALCIUM 9.5 07/03/2021 1535   PROT 6.7 07/03/2021 1535   ALBUMIN 4.7 07/03/2021 1535  AST 35 07/03/2021 1535   ALT 41 07/03/2021 1535   ALKPHOS 122 (H) 07/03/2021 1535   BILITOT 0.4 07/03/2021 1535   GFRNONAA 83 10/25/2019 0817   GFRAA 96 10/25/2019 0817   Lab Results  Component Value Date   CHOL 152 04/24/2021   HDL 43 04/24/2021   LDLCALC 97 04/24/2021   TRIG 56 04/24/2021   CHOLHDL 3.5 04/24/2021   Lab Results  Component Value Date   HGBA1C 5.6 07/03/2021   Lab Results  Component Value Date   NOIBBCWU88 916 07/24/2021   Lab Results  Component Value Date   TSH 1.58 03/26/2021      ASSESSMENT AND PLAN  66 y.o. year old male  has a past medical history of Acid reflux, Allergy, Anemia, Anxiety, History of hiatal hernia, Hypertension, Insomnia, RLS (restless legs syndrome), Sensorineural hearing loss, and Von Willebrand disease ([place]). here with   No diagnosis found.  Kayton is doing well on Neupro patch daily and ropinirole 0.'25mg'$  BID as needed. He notes significant improvement in leg pain and  movements. We will continue current treatment plan. He will continue to alternate site to prevent skin irritation. May use Flonase at site prior to application if he wishes. We have discussed concerns of daytime sleepiness. I suspect limiting caffeine and medication side effects could be contributing to worsening sleepiness. We have discussed role of stimulants and possible side effects. He is hesitant to consider at this time. He wishes to repeat sleep study to evaluate for worsening OSA. He is not sure if he snores. Sleeps with head of bed elevated. Will discuss with Dr Rexene Alberts. He was advised not to drive if he feels tired and sleepy. He was encouraged to continue working on healthy lifestyle habits. He will follow up with me pending sleep study results. He verbalizes understanding and agreement with this plan.    Debbora Presto, MSN, FNP-C 08/28/2021, 11:20 AM  [facility] [address] [place], [place] [address] [phone]

## 2021-09-02 ENCOUNTER — Encounter: Payer: Self-pay | Admitting: Nurse Practitioner

## 2021-09-03 ENCOUNTER — Ambulatory Visit: Payer: Medicare Other | Admitting: Family Medicine

## 2021-09-03 ENCOUNTER — Telehealth: Payer: Self-pay | Admitting: Family Medicine

## 2021-09-03 ENCOUNTER — Other Ambulatory Visit: Payer: Self-pay | Admitting: Nurse Practitioner

## 2021-09-03 DIAGNOSIS — G4733 Obstructive sleep apnea (adult) (pediatric): Secondary | ICD-10-CM

## 2021-09-03 DIAGNOSIS — G2581 Restless legs syndrome: Secondary | ICD-10-CM

## 2021-09-03 DIAGNOSIS — G629 Polyneuropathy, unspecified: Secondary | ICD-10-CM

## 2021-09-03 DIAGNOSIS — I10 Essential (primary) hypertension: Secondary | ICD-10-CM

## 2021-09-03 MED ORDER — AMLODIPINE BESYLATE 5 MG PO TABS: 5.0000 mg | ORAL_TABLET | Freq: Every day | ORAL | 1 refills | Status: DC

## 2021-09-05 NOTE — Telephone Encounter (Signed)
Skin biopsy on August 14, 2021, consistent with small fiber neuropathy with length-dependent pattern, there is no etiology identified

## 2021-09-05 NOTE — Telephone Encounter (Signed)
I spoke to the patient and provided him with the skin biopsy results. He had intolerable side effects with gabapentin, pregabalin and duloxetine in the past. He is currently using diclofenac and EMLA cream in the mornings and evenings. This routine seems seems to be controlling his symptoms at this time. He is also working less which may be helpful too. He does not wish to try another oral medication right now. He will call back if his discomfort worsens. At last visit, lamotrigine and venlafaxine were mentioned as next step options.

## 2021-09-06 ENCOUNTER — Telehealth: Payer: Self-pay | Admitting: Nurse Practitioner

## 2021-09-06 ENCOUNTER — Encounter: Payer: Self-pay | Admitting: Nurse Practitioner

## 2021-09-06 NOTE — Telephone Encounter (Signed)
Note not needed 

## 2021-09-07 ENCOUNTER — Other Ambulatory Visit: Payer: Self-pay | Admitting: Nurse Practitioner

## 2021-09-07 DIAGNOSIS — K921 Melena: Secondary | ICD-10-CM

## 2021-09-13 ENCOUNTER — Ambulatory Visit: Payer: Medicare Other | Admitting: Nurse Practitioner

## 2021-09-16 ENCOUNTER — Encounter: Payer: Self-pay | Admitting: Neurology

## 2021-09-16 ENCOUNTER — Encounter: Payer: Self-pay | Admitting: Nurse Practitioner

## 2021-09-16 DIAGNOSIS — I10 Essential (primary) hypertension: Secondary | ICD-10-CM

## 2021-09-17 ENCOUNTER — Other Ambulatory Visit: Payer: Self-pay | Admitting: *Deleted

## 2021-09-17 MED ORDER — DICLOFENAC SODIUM 1.5 % EX SOLN: CUTANEOUS | 3 refills | Status: DC

## 2021-09-18 ENCOUNTER — Other Ambulatory Visit: Payer: Self-pay | Admitting: *Deleted

## 2021-09-18 MED ORDER — LOSARTAN POTASSIUM 50 MG PO TABS: 50.0000 mg | ORAL_TABLET | Freq: Every day | ORAL | 1 refills | Status: DC

## 2021-09-18 MED ORDER — DICLOFENAC SODIUM 1 % EX GEL: 2.0000 g | Freq: Four times a day (QID) | CUTANEOUS | 3 refills | Status: DC | PRN

## 2021-10-02 MED ORDER — LOSARTAN POTASSIUM 50 MG PO TABS: 50.0000 mg | ORAL_TABLET | Freq: Every day | ORAL | 1 refills | Status: DC

## 2021-10-02 NOTE — Addendum Note (Signed)
Addended by: Earnstine Regal on: 10/02/2021 09:42 AM   Modules accepted: Orders

## 2021-10-09 ENCOUNTER — Encounter: Payer: Self-pay | Admitting: Family Medicine

## 2021-10-15 ENCOUNTER — Encounter: Payer: Self-pay | Admitting: Nurse Practitioner

## 2021-10-15 NOTE — Telephone Encounter (Signed)
Received a message from the patient regarding a referral. Please advise.

## 2021-10-17 ENCOUNTER — Other Ambulatory Visit: Payer: Self-pay | Admitting: Nurse Practitioner

## 2021-10-17 DIAGNOSIS — L989 Disorder of the skin and subcutaneous tissue, unspecified: Secondary | ICD-10-CM

## 2021-10-30 ENCOUNTER — Ambulatory Visit: Payer: Medicare Other | Admitting: Cardiovascular Disease

## 2021-11-01 ENCOUNTER — Other Ambulatory Visit: Payer: Self-pay | Admitting: Internal Medicine

## 2021-11-01 DIAGNOSIS — E785 Hyperlipidemia, unspecified: Secondary | ICD-10-CM

## 2021-11-03 ENCOUNTER — Other Ambulatory Visit: Payer: Self-pay | Admitting: Family Medicine

## 2021-11-05 NOTE — Progress Notes (Unsigned)
11/07/2021 Grant [name] [id number] 06-19-1955  Referring provider: Ailene Ards, NP Primary GI doctor: Dr. Bryan Lemma  ASSESSMENT AND PLAN:   Assessment and Plan: 66 y.o. male here for assessment of the following: 1. Hemorrhoids, unspecified hemorrhoid type   2. Diverticulosis of colon without hemorrhage   3. Von Willebrand disease ([place])   4. Chronic idiopathic constipation   5. History of adenomatous polyp of colon    05/11/2019 colonoscopy for screening purposes excellent prep 3 per hyperplastic polyps 2 to 3 mm size rectosigmoid colon, 8 mm adenomatous polyp in the rectum, diverticulosis sigmoid and transverse, nonbleeding internal hemorrhoids.  Recall 7 years Patient reassured at this OV  Patient with history of von willebrands, has never seen hematology and seems to be mild.  -Sitz baths, increase fiber, increase water, need to fix toilet habits.  -Hydrocortisone supp give and external cream sent in.  -We discussed hemorrhoid banding here in the office if hemorrhoids do not improve with conservative treatment - follow up for evaluation with Dr. Cathleen Corti  Plan:  Meds ordered this encounter  Medications   DISCONTD: hydrocortisone (ANUSOL-HC) 25 MG suppository    Sig: Place 1 suppository (25 mg total) rectally 2 (two) times daily.    Dispense:  12 suppository    Refill:  0   hydrocortisone (ANUSOL-HC) 2.5 % rectal cream    Sig: Place 1 Application rectally 2 (two) times daily.    Dispense:  30 g    Refill:  2   hydrocortisone (ANUSOL-HC) 25 MG suppository    Sig: Place 1 suppository (25 mg total) rectally 2 (two) times daily.    Dispense:  12 suppository    Refill:  0    History of Present Illness:  66 y.o. male  with a past medical history of HTN, von willebrand disease ( has never seen hematologist), anemia, hiatal hernia, and others listed below, returns to clinic today for evaluation of blood in stool.  05/11/2019 colonoscopy for screening purposes  excellent prep 3 per hyperplastic polyps 2 to 3 mm size rectosigmoid colon, 8 mm adenomatous polyp in the rectum, diverticulosis sigmoid and transverse, nonbleeding internal hemorrhoids.  Recall 7 years  Blood in stool intermittent for 2-3 months, has had some BRB on the TP. One episode was large volume in the toliet with BRB.  States his BM's are between soft and hard, can have some straining.  He denies any rectal pain, occ rectal itching with the hemorrhoids.  He is not on fiber or colace.   He  reports that he quit smoking about 5 years ago. His smoking use included cigarettes. He has a 20.00 pack-year smoking history. He has never used smokeless tobacco. He reports current drug use. Drug: Marijuana. He reports that he does not drink alcohol. His family history includes Hyperlipidemia in his father and mother; Hypertension in his father and mother; Stroke in his father.   Current Medications:    Current Outpatient Medications (Cardiovascular):    amLODipine (NORVASC) 5 MG tablet, Take 1 tablet (5 mg total) by mouth daily.   EPINEPHRINE 0.3 mg/0.3 mL IJ SOAJ injection, INJECT 0.3 ML (0.3 MG) INTO THE MUSCLE ONCE FOR 1 DOSE   losartan (COZAAR) 50 MG tablet, Take 1 tablet (50 mg total) by mouth daily.   rosuvastatin (CRESTOR) 5 MG tablet, Take 1 tablet (5 mg total) by mouth daily.     Current Outpatient Medications (Other):    diclofenac Sodium (VOLTAREN) 1 % GEL, Apply 2 g topically  4 (four) times daily as needed.   hydrocortisone (ANUSOL-HC) 2.5 % rectal cream, Place 1 Application rectally 2 (two) times daily.   hydrocortisone (ANUSOL-HC) 25 MG suppository, Place 1 suppository (25 mg total) rectally 2 (two) times daily.   lidocaine-prilocaine (EMLA) cream, 1 gram qid prn   pantoprazole (PROTONIX) 40 MG tablet, TAKE 1 TABLET DAILY   rOPINIRole (REQUIP) 0.25 MG tablet, Take 1 tablet (0.25 mg total) by mouth in the morning and at bedtime.   Rotigotine (NEUPRO) 1 MG/24HR PT24, Place 1  patch (1 mg total) onto the skin daily at 4 PM.   triamcinolone cream (KENALOG) 0.1 %, Apply 1 application. topically 2 (two) times daily. (Patient not taking: Reported on 11/07/2021)  Surgical History:  He  has a past surgical history that includes Open reduction internal fixation (orif) finger with radial bone graft (Right, 02/04/2013); Incision and drainage (Right, 02/04/2013); Circumcision; Foot surgery (Right); Inguinal hernia repair (Bilateral, 05/13/2018); Colonoscopy; Hernia repair; and Inguinal hernia repair (Right, 06/17/2019).  Current Medications, Allergies, Past Medical History, Past Surgical History, Family History and Social History were reviewed in [employer] record.  Physical Exam: BP (!) 110/58   Pulse 60   Ht '5\' 9"'$  (1.753 m)   Wt 180 lb 4 oz (81.8 kg)   BMI 26.62 kg/m  General:   Pleasant, well developed male in no acute distress Heart : Regular rate and rhythm; no murmurs Pulm: Clear anteriorly; no wheezing Abdomen:  Soft, Obese AB, Active bowel sounds. mild tenderness in the lower abdomen. Without guarding and Without rebound, No organomegaly appreciated. Rectal: Normal external rectal exam, normal rectal tone, appreciated internal hemorrhoids, non-tender, no masses, , brown stool, hemoccult Negative Extremities:  without  edema. Neurologic:  Alert and  oriented x4;  No focal deficits.  Psych:  Cooperative. Normal mood and affect.   Vladimir Crofts, PA-C 11/07/21

## 2021-11-07 ENCOUNTER — Encounter: Payer: Self-pay | Admitting: Family Medicine

## 2021-11-07 ENCOUNTER — Ambulatory Visit (INDEPENDENT_AMBULATORY_CARE_PROVIDER_SITE_OTHER): Payer: Medicare Other | Admitting: Physician Assistant

## 2021-11-07 ENCOUNTER — Encounter: Payer: Self-pay | Admitting: Physician Assistant

## 2021-11-07 VITALS — BP 110/58 | HR 60 | Ht 69.0 in | Wt 180.2 lb

## 2021-11-07 DIAGNOSIS — K5904 Chronic idiopathic constipation: Secondary | ICD-10-CM | POA: Diagnosis not present

## 2021-11-07 DIAGNOSIS — K5730 Diverticulosis of large intestine without perforation or abscess without bleeding: Secondary | ICD-10-CM

## 2021-11-07 DIAGNOSIS — D6800 Von Willebrand disease, unspecified: Secondary | ICD-10-CM | POA: Diagnosis not present

## 2021-11-07 DIAGNOSIS — Z86010 Personal history of colonic polyps: Secondary | ICD-10-CM

## 2021-11-07 DIAGNOSIS — K649 Unspecified hemorrhoids: Secondary | ICD-10-CM

## 2021-11-07 MED ORDER — HYDROCORTISONE ACETATE 25 MG RE SUPP: 25.0000 mg | Freq: Two times a day (BID) | RECTAL | 0 refills | Status: DC

## 2021-11-07 MED ORDER — HYDROCORTISONE (PERIANAL) 2.5 % EX CREA
1.0000 | TOPICAL_CREAM | Freq: Two times a day (BID) | CUTANEOUS | 2 refills | Status: DC
Start: 2021-11-07 — End: 2022-05-07

## 2021-11-07 NOTE — Patient Instructions (Addendum)
Please do sitz baths- these can be found at the pharmacy. It is a English as a second language teacher that is put in your toliet.  Please increase fiber or add benefiber, increase water and increase acitivity.  Can add on colace Will send in hydrocoritsone suppository, cheapest with [employer]RX from [employer], [employer], [employer] or [employer] if your insurance does not pay for it. If the hemorrhoid suppository sent in is too expensive you can do this over the counter trick.  Apply a pea size amount of Anusol HC cream to the tip of an over the counter PrepH suppository and insert rectally once every night for at least 7 nights.  If this does not improve there are procedures that can be done.   About Hemorrhoids  Hemorrhoids are swollen veins in the lower rectum and anus.  Also called piles, hemorrhoids are a common problem.  Hemorrhoids may be internal (inside the rectum) or external (around the anus).  Internal Hemorrhoids  Internal hemorrhoids are often painless, but they rarely cause bleeding.  The internal veins may stretch and fall down (prolapse) through the anus to the outside of the body.  The veins may then become irritated and painful.  External Hemorrhoids  External hemorrhoids can be easily seen or felt around the anal opening.  They are under the skin around the anus.  When the swollen veins are scratched or broken by straining, rubbing or wiping they sometimes bleed.  How Hemorrhoids Occur  Veins in the rectum and around the anus tend to swell under pressure.  Hemorrhoids can result from increased pressure in the veins of your anus or rectum.  Some sources of pressure are:  Straining to have a bowel movement because of constipation Waiting too long to have a bowel movement Coughing and sneezing often Sitting for extended periods of time, including on the toilet Diarrhea Obesity Trauma or injury to the anus Some liver diseases Stress Family history of hemorrhoids Pregnancy  Pregnant women should  try to avoid becoming constipated, because they are more likely to have hemorrhoids during pregnancy.  In the last trimester of pregnancy, the enlarged uterus may press on blood vessels and causes hemorrhoids.  In addition, the strain of childbirth sometimes causes hemorrhoids after the birth.  Symptoms of Hemorrhoids  Some symptoms of hemorrhoids include: Swelling and/or a tender lump around the anus Itching, mild burning and bleeding around the anus Painful bowel movements with or without constipation Bright red blood covering the stool, on toilet paper or in the toilet bowel.   Symptoms usually go away within a few days.  Always talk to your doctor about any bleeding to make sure it is not from some other causes.  Diagnosing and Treating Hemorrhoids  Diagnosis is made by an examination by your healthcare provider.  Special test can be performed by your doctor.    Most cases of hemorrhoids can be treated with: High-fiber diet: Eat more high-fiber foods, which help prevent constipation.  Ask for more detailed fiber information on types and sources of fiber from your healthcare provider. Fluids: Drink plenty of water.  This helps soften bowel movements so they are easier to pass. Sitz baths and cold packs: Sitting in lukewarm water two or three times a day for 15 minutes cleases the anal area and may relieve discomfort.  If the water is too hot, swelling around the anus will get worse.  Placing a cloth-covered ice pack on the anus for ten minutes four times a day can also help reduce selling.  Gently pushing a prolapsed hemorrhoid back inside after the bath or ice pack can be helpful. Medications: For mild discomfort, your healthcare provider may suggest over-the-counter pain medication or prescribe a cream or ointment for topical use.  The cream may contain witch hazel, zinc oxide or petroleum jelly.  Medicated suppositories are also a treatment option.  Always consult your doctor before  applying medications or creams. Procedures and surgeries: There are also a number of procedures and surgeries to shrink or remove hemorrhoids in more serious cases.  Talk to your physician about these options.  You can often prevent hemorrhoids or keep them from becoming worse by maintaining a healthy lifestyle.  Eat a fiber-rich diet of fruits, vegetables and whole grains.  Also, drink plenty of water and exercise regularly.   2007, [employer] Doc.30  Diverticulosis Diverticulosis is a condition that develops when small pouches (diverticula) form in the wall of the large intestine (colon). The colon is where water is absorbed and stool (feces) is formed. The pouches form when the inside layer of the colon pushes through weak spots in the outer layers of the colon. You may have a few pouches or many of them. The pouches usually do not cause problems unless they become inflamed or infected. When this happens, the condition is called diverticulitis- this is left lower quadrant pain, diarrhea, fever, chills, nausea or vomiting.  If this occurs please call the office or go to the hospital. Sometimes these patches without inflammation can also have painless bleeding associated with them, if this happens please call the office or go to the hospital. Preventing constipation and increasing fiber can help reduce diverticula and prevent complications. Even if you feel you have a high-fiber diet, suggest getting on Benefiber or Cirtracel 2 times daily.

## 2021-11-08 ENCOUNTER — Encounter: Payer: Self-pay | Admitting: Internal Medicine

## 2021-11-08 ENCOUNTER — Ambulatory Visit: Payer: Medicare Other | Attending: Internal Medicine | Admitting: Internal Medicine

## 2021-11-08 VITALS — BP 112/58 | HR 56 | Ht 66.0 in | Wt 179.4 lb

## 2021-11-08 DIAGNOSIS — I700 Atherosclerosis of aorta: Secondary | ICD-10-CM

## 2021-11-08 DIAGNOSIS — E785 Hyperlipidemia, unspecified: Secondary | ICD-10-CM | POA: Diagnosis not present

## 2021-11-08 DIAGNOSIS — R931 Abnormal findings on diagnostic imaging of heart and coronary circulation: Secondary | ICD-10-CM | POA: Diagnosis not present

## 2021-11-08 NOTE — Progress Notes (Signed)
LIPID CLINIC CONSULT NOTE  Chief Complaint:  Manage dyslipidemia  Primary Care Physician: Ailene Ards, NP  Primary Cardiologist:  Kathlyn [place], Grant [name]  HPI:  Grant [name] is a 66 y.o. male who is being seen today for the evaluation of dyslipidemia at the request of Ailene Ards, NP.  This is a pleasant 66 year old veteran patient at [facility] who is currently referred for evaluation management of dyslipidemia.  He recently had had some dizziness and other symptoms underwent coronary CT angiography.  This demonstrated aortic atherosclerosis and moderate nonobstructive coronary disease with a calcium score of 122, 22 percentile for age and sex matched controls.  There was moderate stenosis in the mid LAD and mild stenosis in the proximal LAD and RCA.  Aggressive medical therapy was recommended.  Unfortunately has been intolerant to statins in the past including high and low-dose atorvastatin and then was also tried on ezetimibe, both of which caused myalgias.  He has not tried any other therapies.  His most recent untreated lipid in February showed total cholesterol 152, HDL 43, LDL 97 and triglycerides 56.  11/08/2021  Grant [name] returns today for follow-up.  He reports tolerating low-dose rosuvastatin 5 mg daily without issues.  Unfortunately he did not get the reminder regarding getting his labs until just yesterday.  He is fasting today, however and we will plan to repeat his labs.  PMHx:  Past Medical History:  Diagnosis Date   Acid reflux    Allergy    Anemia    Anxiety    Basal cell carcinoma (BCC) of nasal tip    History of hiatal hernia    Hypertension    Insomnia    RLS (restless legs syndrome)    Sensorineural hearing loss    Von Willebrand disease ([place])    PT STATES THIS IS MILD AND HAS NEVER HAD TO SEE HEMATOLOGIST-PT DID SAY THAT WHEN [place] HE BLED ALOT BUT IT WAS DUE TO NOT FOLLOWING POST OP INSTRUCTIONS     Past Surgical  History:  Procedure Laterality Date   CIRCUMCISION     AS AN ADULT   COLONOSCOPY     FOOT SURGERY Right    arch support   HERNIA REPAIR     INCISION AND DRAINAGE Right 02/04/2013   Procedure: INCISION AND DRAINAGE;  Surgeon: Linna Hoff, Grant [name];  Location: [facility];  Service: Orthopedics;  Laterality: Right;   INGUINAL HERNIA REPAIR Bilateral 05/13/2018   Procedure: LAPAROSCOPIC BILATERAL INGUINAL HERNIA REPAIR;  Surgeon: Olean Ree, Grant [name];  Location: [facility];  Service: General;  Laterality: Bilateral;   INGUINAL HERNIA REPAIR Right 06/17/2019   Procedure: HERNIA REPAIR INGUINAL ADULT WITH MESH-- Recurrent;  Surgeon: Olean Ree, Grant [name];  Location: [facility];  Service: General;  Laterality: Right;   OPEN REDUCTION INTERNAL FIXATION (ORIF) FINGER WITH RADIAL BONE GRAFT Right 02/04/2013   Procedure: OPEN REDUCTION INTERNAL FIXATION (ORIF) right ring and small fingers;  Surgeon: Linna Hoff, Grant [name];  Location: [facility];  Service: Orthopedics;  Laterality: Right;    FAMHx:  Family History  Problem Relation Age of Onset   Hypertension Mother    Hyperlipidemia Mother    Hypertension Father    Hyperlipidemia Father    Stroke Father        x2   Colon cancer Neg Hx    Esophageal cancer Neg Hx    Rectal cancer Neg Hx    Stomach cancer Neg Hx  Sleep apnea Neg Hx    Neuropathy Neg Hx    Pancreatic cancer Neg Hx     SOCHx:   reports that he quit smoking about 5 years ago. His smoking use included cigarettes. He has a 20.00 pack-year smoking history. He has never used smokeless tobacco. He reports current drug use. Drug: Marijuana. He reports that he does not drink alcohol.  ALLERGIES:  Allergies  Allergen Reactions   Bee Venom Anaphylaxis, Swelling and Rash   Asa [Aspirin] Other (See Comments)    Gi upset   Atorvastatin Other (See Comments)    Joint pain   Duloxetine Other (See Comments)    GI upset   Zetia [Ezetimibe] Other (See Comments)    Joint pain    ROS: Pertinent items noted in  HPI and remainder of comprehensive ROS otherwise negative.  HOME MEDS: Current Outpatient Medications on File Prior to Visit  Medication Sig Dispense Refill   amLODipine (NORVASC) 5 MG tablet Take 1 tablet (5 mg total) by mouth daily. 90 tablet 1   diclofenac Sodium (VOLTAREN) 1 % GEL Apply 2 g topically 4 (four) times daily as needed. 450 g 3   EPINEPHRINE 0.3 mg/0.3 mL IJ SOAJ injection INJECT 0.3 ML (0.3 MG) INTO THE MUSCLE ONCE FOR 1 DOSE 2 each 2   hydrocortisone (ANUSOL-HC) 2.5 % rectal cream Place 1 Application rectally 2 (two) times daily. 30 g 2   hydrocortisone (ANUSOL-HC) 25 MG suppository Place 1 suppository (25 mg total) rectally 2 (two) times daily. 12 suppository 0   lidocaine-prilocaine (EMLA) cream 1 gram qid prn 30 g 11   losartan (COZAAR) 50 MG tablet Take 1 tablet (50 mg total) by mouth daily. 90 tablet 1   pantoprazole (PROTONIX) 40 MG tablet TAKE 1 TABLET DAILY 90 tablet 3   rOPINIRole (REQUIP) 0.25 MG tablet Take 1 tablet (0.25 mg total) by mouth in the morning and at bedtime. 180 tablet 3   rosuvastatin (CRESTOR) 5 MG tablet Take 1 tablet (5 mg total) by mouth daily. 90 tablet 3   Rotigotine (NEUPRO) 1 MG/24HR PT24 Place 1 patch (1 mg total) onto the skin daily at 4 PM. 90 patch 3   triamcinolone cream (KENALOG) 0.1 % Apply 1 application. topically 2 (two) times daily. 30 g 0   No current facility-administered medications on file prior to visit.    LABS/IMAGING: No results found for this or any previous visit (from the past 48 hour(s)). No results found.  LIPID PANEL:    Component Value Date/Time   CHOL 152 04/24/2021 0744   TRIG 56 04/24/2021 0744   HDL 43 04/24/2021 0744   CHOLHDL 3.5 04/24/2021 0744   CHOLHDL 4.5 10/25/2019 0817   VLDL 10 06/17/2016 0945   LDLCALC 97 04/24/2021 0744   LDLCALC 112 (H) 10/25/2019 0817    WEIGHTS: Wt Readings from Last 3 Encounters:  11/08/21 179 lb 6.4 oz (81.4 kg)  11/07/21 180 lb 4 oz (81.8 kg)  08/14/21 177 lb  (80.3 kg)    VITALS: BP (!) 112/58   Pulse (!) 56   Ht '5\' 6"'$  (1.676 m)   Wt 179 lb 6.4 oz (81.4 kg)   SpO2 97%   BMI 28.96 kg/m   EXAM: Deferred  EKG: Deferred  ASSESSMENT: Mixed dyslipidemia, goal LDL less than 70 Mild to moderate nonobstructive coronary disease by CTA, CAC score 122 (56 percentile)-04/16/2021 Hypertension  PLAN: 1.   Grant [name] seems to be tolerating low-dose rosuvastatin.  Hopefully this will  give him enough lipid-lowering to reach target LDL less than 70.  He will have labs today since he is fasting.  I will contact him with those results.  If he is reached target he could continue to follow with Grant [name] and see me on an as-needed basis.  Thanks again for allowing me to participate in his care.  Grant Casino, Grant [name], [facility] [place] Director of the Advanced Lipid Disorders &  Cardiovascular Risk Reduction Clinic Diplomate of the American Board of Clinical Lipidology Attending Cardiologist  Direct Dial: [phone]  Fax: [phone]  Website:  [email or web address]  Grant [name] 11/08/2021, 8:10 AM

## 2021-11-08 NOTE — Progress Notes (Signed)
Agree with the assessment and plan as outlined by Amanda Collier, PA-C. ? ?Abeer Iversen, DO, FACG ? ?

## 2021-11-08 NOTE — Patient Instructions (Signed)
Medication Instructions:  Your physician recommends that you continue on your current medications as directed. Please refer to the Current Medication list given to you today.  *If you need a refill on your cardiac medications before your next appointment, please call your pharmacy*  Lab Work: NMR lipoprofile today  If you have labs (blood work) drawn today and your tests are completely normal, you will receive your results only by: [place] (if you have MyChart) OR A paper copy in the mail If you have any lab test that is abnormal or we need to change your treatment, we will call you to review the results.  Follow-Up: At [facility], you and your health needs are our priority.  As part of our continuing mission to provide you with exceptional heart care, we have created designated Provider Care Teams.  These Care Teams include your primary Cardiologist (physician) and Advanced Practice Providers (APPs -  Physician Assistants and Nurse Practitioners) who all work together to provide you with the care you need, when you need it.  We recommend signing up for the patient portal called "MyChart".  Sign up information is provided on this After Visit Summary.  MyChart is used to connect with patients for Virtual Visits (Telemedicine).  Patients are able to view lab/test results, encounter notes, upcoming appointments, etc.  Non-urgent messages can be sent to your provider as well.   To learn more about what you can do with MyChart, go to [email or web address].    Your next appointment:   Pending results

## 2021-11-09 LAB — NMR, LIPOPROFILE
Cholesterol, Total: 114 mg/dL (ref 100–199)
HDL Particle Number: 32.2 umol/L (ref >=30.5)
HDL-C: 43 mg/dL (ref >39)
LDL Particle Number: 610 nmol/L (ref <1000)
LDL Size: 20.5 nm — ABNORMAL LOW (ref >20.5)
LDL-C (NIH Calc): 55 mg/dL (ref 0–99)
LP-IR Score: 59 — ABNORMAL HIGH (ref <=45)
Small LDL Particle Number: 257 nmol/L (ref <=527)
Triglycerides: 78 mg/dL (ref 0–149)

## 2021-11-10 ENCOUNTER — Encounter: Payer: Self-pay | Admitting: Internal Medicine

## 2021-11-24 ENCOUNTER — Encounter: Payer: Self-pay | Admitting: Nurse Practitioner

## 2021-11-28 ENCOUNTER — Other Ambulatory Visit: Payer: Self-pay | Admitting: Family Medicine

## 2021-11-28 NOTE — Telephone Encounter (Signed)
Not a pt in this practice. Requested Prescriptions  Pending Prescriptions Disp Refills  . pantoprazole (PROTONIX) 40 MG tablet [Pharmacy Med Name: PANTOPRAZOLE SODIUM DR TABS '40MG'$ ] 90 tablet 3    Sig: TAKE 1 TABLET DAILY     Gastroenterology: Proton Pump Inhibitors Passed - 11/28/2021  9:10 AM      Passed - Valid encounter within last 12 months    Recent Outpatient Visits          8 months ago Aortic atherosclerosis ([place])   [place] Eulogio Bear, NP   1 year ago Acute rhinitis   [place] Eulogio Bear, NP   1 year ago [place] Eulogio Bear, NP   1 year ago Lymphadenopathy of head and neck   [place] Eulogio Bear, NP   1 year ago Ulnar neuropathy of left upper extremity   [place] Pickard, Cammie Mcgee, MD      Future Appointments            In 3 weeks Ailene Ards, NP [place] at [facility]

## 2021-11-30 ENCOUNTER — Encounter: Payer: Self-pay | Admitting: Nurse Practitioner

## 2021-12-04 ENCOUNTER — Encounter: Payer: Self-pay | Admitting: Nurse Practitioner

## 2021-12-07 ENCOUNTER — Other Ambulatory Visit: Payer: Self-pay

## 2021-12-07 MED ORDER — PANTOPRAZOLE SODIUM 40 MG PO TBEC: 40.0000 mg | DELAYED_RELEASE_TABLET | Freq: Every day | ORAL | 3 refills | Status: DC

## 2021-12-12 ENCOUNTER — Ambulatory Visit (INDEPENDENT_AMBULATORY_CARE_PROVIDER_SITE_OTHER): Payer: Medicare Other

## 2021-12-12 VITALS — BP 124/80 | HR 56 | Temp 98.3°F | Ht 66.0 in | Wt 180.0 lb

## 2021-12-12 DIAGNOSIS — Z0000 Encounter for general adult medical examination without abnormal findings: Secondary | ICD-10-CM | POA: Diagnosis not present

## 2021-12-12 NOTE — Patient Instructions (Addendum)
Grant [name] , Thank you for taking time to come for your Medicare Wellness Visit. I appreciate your ongoing commitment to your health goals. Please review the following plan we discussed and let me know if I can assist you in the future.   These are the goals we discussed:  Goals      My goal is to get off some of my medication.        This is a list of the screening recommended for you and due dates:  Health Maintenance  Topic Date Due   COVID-19 Vaccine (1) Never done   Pneumonia Vaccine (1 - PCV) Never done   Zoster (Shingles) Vaccine (1 of 2) Never done   Flu Shot  Never done   Tetanus Vaccine  02/05/2023   Colon Cancer Screening  05/11/2026   Hepatitis C Screening: USPSTF Recommendation to screen - Ages 18-79 yo.  Completed   HPV Vaccine  Aged Out    Advanced directives: Yes; needs to be updated  Conditions/risks identified: Yes  Next appointment: Follow up in one year for your annual wellness visit.   Preventive Care 25 Years and Older, Male  Preventive care refers to lifestyle choices and visits with your health care provider that can promote health and wellness. What does preventive care include? A yearly physical exam. This is also called an annual well check. Dental exams once or twice a year. Routine eye exams. Ask your health care provider how often you should have your eyes checked. Personal lifestyle choices, including: Daily care of your teeth and gums. Regular physical activity. Eating a healthy diet. Avoiding tobacco and drug use. Limiting alcohol use. Practicing safe sex. Taking low doses of aspirin every day. Taking vitamin and mineral supplements as recommended by your health care provider. What happens during an annual well check? The services and screenings done by your health care provider during your annual well check will depend on your age, overall health, lifestyle risk factors, and family history of disease. Counseling  Your health care  provider may ask you questions about your: Alcohol use. Tobacco use. Drug use. Emotional well-being. Home and relationship well-being. Sexual activity. Eating habits. History of falls. Memory and ability to understand (cognition). Work and work Statistician. Screening  You may have the following tests or measurements: Height, weight, and BMI. Blood pressure. Lipid and cholesterol levels. These may be checked every 5 years, or more frequently if you are over 78 years old. Skin check. Lung cancer screening. You may have this screening every year starting at age 69 if you have a 30-pack-year history of smoking and currently smoke or have quit within the past 15 years. Fecal occult blood test (FOBT) of the stool. You may have this test every year starting at age 71. Flexible sigmoidoscopy or colonoscopy. You may have a sigmoidoscopy every 5 years or a colonoscopy every 10 years starting at age 1. Prostate cancer screening. Recommendations will vary depending on your family history and other risks. Hepatitis C blood test. Hepatitis B blood test. Sexually transmitted disease (STD) testing. Diabetes screening. This is done by checking your blood sugar (glucose) after you have not eaten for a while (fasting). You may have this done every 1-3 years. Abdominal aortic aneurysm (AAA) screening. You may need this if you are a current or former smoker. Osteoporosis. You may be screened starting at age 74 if you are at high risk. Talk with your health care provider about your test results, treatment options, and if  necessary, the need for more tests. Vaccines  Your health care provider may recommend certain vaccines, such as: Influenza vaccine. This is recommended every year. Tetanus, diphtheria, and acellular pertussis (Tdap, Td) vaccine. You may need a Td booster every 10 years. Zoster vaccine. You may need this after age 23. Pneumococcal 13-valent conjugate (PCV13) vaccine. One dose is  recommended after age 68. Pneumococcal polysaccharide (PPSV23) vaccine. One dose is recommended after age 42. Talk to your health care provider about which screenings and vaccines you need and how often you need them. This information is not intended to replace advice given to you by your health care provider. Make sure you discuss any questions you have with your health care provider. Document Released: 03/24/2015 Document Revised: 11/15/2015 Document Reviewed: 12/27/2014 Elsevier Interactive Patient Education  2017 [employer] Prevention in the Home Falls can cause injuries. They can happen to people of all ages. There are many things you can do to make your home safe and to help prevent falls. What can I do on the outside of my home? Regularly fix the edges of walkways and driveways and fix any cracks. Remove anything that might make you trip as you walk through a door, such as a raised step or threshold. Trim any bushes or trees on the path to your home. Use bright outdoor lighting. Clear any walking paths of anything that might make someone trip, such as rocks or tools. Regularly check to see if handrails are loose or broken. Make sure that both sides of any steps have handrails. Any raised decks and porches should have guardrails on the edges. Have any leaves, snow, or ice cleared regularly. Use sand or salt on walking paths during winter. Clean up any spills in your garage right away. This includes oil or grease spills. What can I do in the bathroom? Use night lights. Install grab bars by the toilet and in the tub and shower. Do not use towel bars as grab bars. Use non-skid mats or decals in the tub or shower. If you need to sit down in the shower, use a plastic, non-slip stool. Keep the floor dry. Clean up any water that spills on the floor as soon as it happens. Remove soap buildup in the tub or shower regularly. Attach bath mats securely with double-sided non-slip rug  tape. Do not have throw rugs and other things on the floor that can make you trip. What can I do in the bedroom? Use night lights. Make sure that you have a light by your bed that is easy to reach. Do not use any sheets or blankets that are too big for your bed. They should not hang down onto the floor. Have a firm chair that has side arms. You can use this for support while you get dressed. Do not have throw rugs and other things on the floor that can make you trip. What can I do in the kitchen? Clean up any spills right away. Avoid walking on wet floors. Keep items that you use a lot in easy-to-reach places. If you need to reach something above you, use a strong step stool that has a grab bar. Keep electrical cords out of the way. Do not use floor polish or wax that makes floors slippery. If you must use wax, use non-skid floor wax. Do not have throw rugs and other things on the floor that can make you trip. What can I do with my stairs? Do not leave any items  on the stairs. Make sure that there are handrails on both sides of the stairs and use them. Fix handrails that are broken or loose. Make sure that handrails are as long as the stairways. Check any carpeting to make sure that it is firmly attached to the stairs. Fix any carpet that is loose or worn. Avoid having throw rugs at the top or bottom of the stairs. If you do have throw rugs, attach them to the floor with carpet tape. Make sure that you have a light switch at the top of the stairs and the bottom of the stairs. If you do not have them, ask someone to add them for you. What else can I do to help prevent falls? Wear shoes that: Do not have high heels. Have rubber bottoms. Are comfortable and fit you well. Are closed at the toe. Do not wear sandals. If you use a stepladder: Make sure that it is fully opened. Do not climb a closed stepladder. Make sure that both sides of the stepladder are locked into place. Ask someone to  hold it for you, if possible. Clearly mark and make sure that you can see: Any grab bars or handrails. First and last steps. Where the edge of each step is. Use tools that help you move around (mobility aids) if they are needed. These include: Canes. Walkers. Scooters. Crutches. Turn on the lights when you go into a dark area. Replace any light bulbs as soon as they burn out. Set up your furniture so you have a clear path. Avoid moving your furniture around. If any of your floors are uneven, fix them. If there are any pets around you, be aware of where they are. Review your medicines with your doctor. Some medicines can make you feel dizzy. This can increase your chance of falling. Ask your doctor what other things that you can do to help prevent falls. This information is not intended to replace advice given to you by your health care provider. Make sure you discuss any questions you have with your health care provider. Document Released: 12/22/2008 Document Revised: 08/03/2015 Document Reviewed: 04/01/2014 Elsevier Interactive Patient Education  2017 [employer].

## 2021-12-12 NOTE — Progress Notes (Signed)
Subjective:   Grant [name] is a 66 y.o. male who presents for Medicare Annual/Subsequent preventive examination.  Review of Systems     Cardiac Risk Factors include: advanced age (>29mn, >>25women);hypertension;dyslipidemia;male gender     Objective:    Today's Vitals   12/12/21 0848  BP: 124/80  Pulse: (!) 56  Temp: 98.3 F (36.8 C)  SpO2: 97%  Weight: 180 lb (81.6 kg)  Height: '5\' 6"'$  (1.676 m)  PainSc: 0-No pain   Body mass index is 29.05 kg/m.     12/12/2021    9:05 AM 06/01/2020    9:33 AM 06/17/2019    8:01 AM 06/10/2019    8:47 AM 05/13/2018   10:28 AM 11/08/2016    4:13 PM 06/30/2015    7:20 AM  Advanced Directives  Does Patient Have a Medical Advance Directive? No No  No No No No  Would patient like information on creating a medical advance directive? No - Patient declined Yes (MAU/Ambulatory/Procedural Areas - Information given)  No - Patient declined No - Patient declined Yes (ED - Information included in AVS) No - patient declined information     Information is confidential and restricted. Go to Review Flowsheets to unlock data.    Current Medications (verified) Outpatient Encounter Medications as of 12/12/2021  Medication Sig   amLODipine (NORVASC) 5 MG tablet Take 1 tablet (5 mg total) by mouth daily.   diclofenac Sodium (VOLTAREN) 1 % GEL Apply 2 g topically 4 (four) times daily as needed.   EPINEPHRINE 0.3 mg/0.3 mL IJ SOAJ injection INJECT 0.3 ML (0.3 MG) INTO THE MUSCLE ONCE FOR 1 DOSE   hydrocortisone (ANUSOL-HC) 2.5 % rectal cream Place 1 Application rectally 2 (two) times daily.   hydrocortisone (ANUSOL-HC) 25 MG suppository Place 1 suppository (25 mg total) rectally 2 (two) times daily.   lidocaine-prilocaine (EMLA) cream 1 gram qid prn   losartan (COZAAR) 50 MG tablet Take 1 tablet (50 mg total) by mouth daily.   pantoprazole (PROTONIX) 40 MG tablet Take 1 tablet (40 mg total) by mouth daily.   rOPINIRole (REQUIP) 0.25 MG tablet Take 1 tablet (0.25  mg total) by mouth in the morning and at bedtime.   rosuvastatin (CRESTOR) 5 MG tablet Take 1 tablet (5 mg total) by mouth daily.   Rotigotine (NEUPRO) 1 MG/24HR PT24 Place 1 patch (1 mg total) onto the skin daily at 4 PM.   triamcinolone cream (KENALOG) 0.1 % Apply 1 application. topically 2 (two) times daily.   No facility-administered encounter medications on file as of 12/12/2021.    Allergies (verified) Bee venom, Asa [aspirin], Atorvastatin, Duloxetine, and Zetia [ezetimibe]   History: Past Medical History:  Diagnosis Date   Acid reflux    Allergy    Anemia    Anxiety    Basal cell carcinoma (BCC) of nasal tip    History of hiatal hernia    Hypertension    Insomnia    RLS (restless legs syndrome)    Sensorineural hearing loss    Von Willebrand disease ([place]    PT STATES THIS IS MILD AND HAS NEVER HAD TO SEE HEMATOLOGIST-PT DID SAY THAT WHEN [place] HE BLED ALOT BUT IT WAS DUE TO NOT FOLLOWING POST OP INSTRUCTIONS    Past Surgical History:  Procedure Laterality Date   CIRCUMCISION     AS AN ADULT   COLONOSCOPY     FOOT SURGERY Right    arch support  HERNIA REPAIR     INCISION AND DRAINAGE Right 02/04/2013   Procedure: INCISION AND DRAINAGE;  Surgeon: Linna Hoff, MD;  Location: [facility];  Service: Orthopedics;  Laterality: Right;   INGUINAL HERNIA REPAIR Bilateral 05/13/2018   Procedure: LAPAROSCOPIC BILATERAL INGUINAL HERNIA REPAIR;  Surgeon: Olean Ree, MD;  Location: [facility];  Service: General;  Laterality: Bilateral;   INGUINAL HERNIA REPAIR Right 06/17/2019   Procedure: HERNIA REPAIR INGUINAL ADULT WITH MESH-- Recurrent;  Surgeon: Olean Ree, MD;  Location: [facility];  Service: General;  Laterality: Right;   OPEN REDUCTION INTERNAL FIXATION (ORIF) FINGER WITH RADIAL BONE GRAFT Right 02/04/2013   Procedure: OPEN REDUCTION INTERNAL FIXATION (ORIF) right ring and small fingers;  Surgeon: Linna Hoff, MD;  Location: [facility];   Service: Orthopedics;  Laterality: Right;   Family History  Problem Relation Age of Onset   Hypertension Mother    Hyperlipidemia Mother    Hypertension Father    Hyperlipidemia Father    Stroke Father        x2   Colon cancer Neg Hx    Esophageal cancer Neg Hx    Rectal cancer Neg Hx    Stomach cancer Neg Hx    Sleep apnea Neg Hx    Neuropathy Neg Hx    Pancreatic cancer Neg Hx    Social History   Socioeconomic History   Marital status: Married    Spouse name: Not on file   Number of children: 4   Years of education: 12   Highest education level: Not on file  Occupational History   Occupation: Sales executive   Tobacco Use   Smoking status: Former    Packs/day: 0.50    Years: 40.00    Total pack years: 20.00    Types: Cigarettes    Quit date: 05/10/2016    Years since quitting: 5.5   Smokeless tobacco: Never  Vaping Use   Vaping Use: Former   Quit date: 05/10/2016  Substance and Sexual Activity   Alcohol use: No   Drug use: Yes    Types: Marijuana    Comment: uses daily   Sexual activity: Not on file  Other Topics Concern   Not on file  Social History Narrative   Denies caffeine use    Social Determinants of Health   Financial Resource Strain: Low Risk  (12/12/2021)   Overall Financial Resource Strain (CARDIA)    Difficulty of Paying Living Expenses: Not hard at all  Food Insecurity: No Food Insecurity (12/12/2021)   Hunger Vital Sign    Worried About Running Out of Food in the Last Year: Never true    Ran Out of Food in the Last Year: Never true  Transportation Needs: No Transportation Needs (12/12/2021)   PRAPARE - Hydrologist (Medical): No    Lack of Transportation (Non-Medical): No  Physical Activity: Not on file  Stress: No Stress Concern Present (12/12/2021)   [place]    Feeling of Stress : Not at all  Social Connections: [place] (12/12/2021)    Social Connection and Isolation Panel [NHANES]    Frequency of Communication with Friends and Family: More than three times a week    Frequency of Social Gatherings with Friends and Family: More than three times a week    Attends Religious Services: More than 4 times per year    Active Member of [employer] or Organizations: Yes  Attends Music therapist: More than 4 times per year    Marital Status: Married    Tobacco Counseling Counseling given: Not Answered   Clinical Intake:  Pre-visit preparation completed: Yes  Pain : No/denies pain Pain Score: 0-No pain     Nutritional Risks: None Diabetes: No  How often do you need to have someone help you when you read instructions, pamphlets, or other written materials from your doctor or pharmacy?: 1 - Never What is the last grade level you completed in school?: HSG  Diabetic? no  Interpreter Needed?: No  Information entered by :: Lisette Abu, LPN.   Activities of Daily Living    12/12/2021    8:50 AM  In your present state of health, do you have any difficulty performing the following activities:  Hearing? 0  Vision? 0  Difficulty concentrating or making decisions? 0  Walking or climbing stairs? 0  Dressing or bathing? 0  Doing errands, shopping? 0  Preparing Food and eating ? N  Using the Toilet? N  In the past six months, have you accidently leaked urine? N  Do you have problems with loss of bowel control? N  Managing your Medications? N  Managing your Finances? N  Housekeeping or managing your Housekeeping? N    Patient Care Team: Ailene Ards, NP as PCP - General (Nurse Practitioner) Wellington Hampshire, MD as PCP - Cardiology (Cardiology) Olean Ree, MD as Consulting Physician (General Surgery)  Indicate any recent Medical Services you may have received from other than [facility] providers in the past year (date may be approximate).     Assessment:   This is a routine wellness examination  for Grant [name].  Hearing/Vision screen Hearing Screening - Comments:: Patient has tinnitus; has hearing aids. Vision Screening - Comments:: Patient wears eyeglasses.  [facility] by: [facility]  Dietary issues and exercise activities discussed: Current Exercise Habits: The patient has a physically strenuous job, but has no regular exercise apart from work.;Home exercise routine, Type of exercise: walking, Time (Minutes): 60, Frequency (Times/Week): 7, Weekly Exercise (Minutes/Week): 420, Intensity: Moderate, Exercise limited by: respiratory conditions(s)   Goals Addressed             This Visit's Progress    My goal is to get off some of my medication.        Depression Screen    12/12/2021    8:50 AM 05/17/2021   10:44 AM 07/20/2020    9:02 AM 03/23/2020    8:14 AM 01/07/2020    7:53 AM 12/30/2017    8:01 AM  PHQ 2/9 Scores  PHQ - 2 Score 0 0 3 0 0 0    Fall Risk    12/12/2021    8:50 AM 05/17/2021   10:44 AM 07/20/2020    8:26 AM 06/16/2020   12:13 PM 05/05/2020   12:06 PM  [place] in the past year? 0 0 0  0  Number falls in past yr: 0 0 0 0 0  Injury with Fall? 0 0 0 0 0  Risk for fall due to : No Fall Risks      Follow up Falls prevention discussed        FALL RISK PREVENTION PERTAINING TO THE HOME:  Any stairs in or around the home? No  If so, are there any without handrails? No  Home free of loose throw rugs in walkways, pet beds, electrical cords, etc? Yes  Adequate lighting in  your home to reduce risk of falls? Yes   ASSISTIVE DEVICES UTILIZED TO PREVENT FALLS:  Life alert? No  Use of a cane, walker or w/c? No  Grab bars in the bathroom? Yes  Shower chair or bench in shower? Yes  Elevated toilet seat or a handicapped toilet? Yes   TIMED UP AND GO:  Was the test performed? Yes .  Length of time to ambulate 10 feet: 6 sec.   Gait steady and fast without use of assistive device  Cognitive Function:        12/12/2021    8:50 AM 06/07/2021     9:13 AM 07/20/2020    8:28 AM  6CIT Screen  What Year? 0 points 0 points 0 points  What month? 0 points 0 points 0 points  What time? 0 points 0 points 0 points  Count back from 20 0 points 0 points 0 points  Months in reverse 0 points 4 points 0 points  Repeat phrase 0 points 0 points 4 points  Total Score 0 points 4 points 4 points    Immunizations Immunization History  Administered Date(s) Administered   Tdap 02/04/2013    TDAP status: Up to date  Flu Vaccine status: Declined, Education has been provided regarding the importance of this vaccine but patient still declined. Advised may receive this vaccine at local pharmacy or Health Dept. Aware to provide a copy of the vaccination record if obtained from local pharmacy or Health Dept. Verbalized acceptance and understanding.  Pneumococcal vaccine status: Declined,  Education has been provided regarding the importance of this vaccine but patient still declined. Advised may receive this vaccine at local pharmacy or Health Dept. Aware to provide a copy of the vaccination record if obtained from local pharmacy or Health Dept. Verbalized acceptance and understanding.   Covid-19 vaccine status: Declined, Education has been provided regarding the importance of this vaccine but patient still declined. Advised may receive this vaccine at local pharmacy or Health Dept.or vaccine clinic. Aware to provide a copy of the vaccination record if obtained from local pharmacy or Health Dept. Verbalized acceptance and understanding.  Qualifies for Shingles Vaccine? No   Zostavax completed No   Shingrix Completed?: No.    Education has been provided regarding the importance of this vaccine. Patient has been advised to call insurance company to determine out of pocket expense if they have not yet received this vaccine. Advised may also receive vaccine at local pharmacy or Health Dept. Verbalized acceptance and understanding.  Screening Tests Health  Maintenance  Topic Date Due   COVID-19 Vaccine (1) Never done   Pneumonia Vaccine 54+ Years old (1 - PCV) Never done   Zoster Vaccines- Shingrix (1 of 2) Never done   INFLUENZA VACCINE  Never done   TETANUS/TDAP  02/05/2023   COLONOSCOPY (Pts 45-49yr Insurance coverage will need to be confirmed)  05/11/2026   Hepatitis C Screening  Completed   HPV VACCINES  Aged Out    Health Maintenance  Health Maintenance Due  Topic Date Due   COVID-19 Vaccine (1) Never done   Pneumonia Vaccine 68 Years old (1 - PCV) Never done   Zoster Vaccines- Shingrix (1 of 2) Never done   INFLUENZA VACCINE  Never done    Colorectal cancer screening: Type of screening: Colonoscopy. Completed 05/11/2019. Repeat every 7 years  Lung Cancer Screening: (Low Dose CT Chest recommended if Age 67-80years, 30 pack-year currently smoking OR have quit w/in 15years.) does not qualify.  Lung Cancer Screening Referral: no  Additional Screening:  Hepatitis C Screening: does qualify; Completed 06/17/2016  Vision Screening: Recommended annual ophthalmology exams for early detection of glaucoma and other disorders of the eye. Is the patient up to date with their annual eye exam?  Yes  Who is the provider or what is the name of the office in which the patient attends annual eye exams? [facility] If pt is not established with a provider, would they like to be referred to a provider to establish care? No .   Dental Screening: Recommended annual dental exams for proper oral hygiene  Community Resource Referral / Chronic Care Management: CRR required this visit?  No   CCM required this visit?  No      Plan:     I have personally reviewed and noted the following in the patient's chart:   Medical and social history Use of alcohol, tobacco or illicit drugs  Current medications and supplements including opioid prescriptions. Patient is not currently taking opioid prescriptions. Functional ability and  status Nutritional status Physical activity Advanced directives List of other physicians Hospitalizations, surgeries, and ER visits in previous 12 months Vitals Screenings to include cognitive, depression, and falls Referrals and appointments  In addition, I have reviewed and discussed with patient certain preventive protocols, quality metrics, and best practice recommendations. A written personalized care plan for preventive services as well as general preventive health recommendations were provided to patient.     Sheral Flow, LPN   [id number]/08/[id number]   Nurse Notes: N/A

## 2021-12-20 ENCOUNTER — Ambulatory Visit (INDEPENDENT_AMBULATORY_CARE_PROVIDER_SITE_OTHER): Payer: Medicare Other | Admitting: Nurse Practitioner

## 2021-12-20 VITALS — BP 120/64 | HR 63 | Temp 98.2°F | Ht 66.0 in | Wt 180.1 lb

## 2021-12-20 DIAGNOSIS — I10 Essential (primary) hypertension: Secondary | ICD-10-CM | POA: Diagnosis not present

## 2021-12-20 DIAGNOSIS — J432 Centrilobular emphysema: Secondary | ICD-10-CM | POA: Diagnosis not present

## 2021-12-20 DIAGNOSIS — R748 Abnormal levels of other serum enzymes: Secondary | ICD-10-CM

## 2021-12-20 DIAGNOSIS — L989 Disorder of the skin and subcutaneous tissue, unspecified: Secondary | ICD-10-CM

## 2021-12-20 DIAGNOSIS — G2581 Restless legs syndrome: Secondary | ICD-10-CM

## 2021-12-20 LAB — COMPREHENSIVE METABOLIC PANEL
ALT: 24 U/L (ref 0–53)
AST: 19 U/L (ref 0–37)
Albumin: 4.1 g/dL (ref 3.5–5.2)
Alkaline Phosphatase: 91 U/L (ref 39–117)
BUN: 19 mg/dL (ref 6–23)
CO2: 29 mEq/L (ref 19–32)
Calcium: 9.3 mg/dL (ref 8.4–10.5)
Chloride: 105 mEq/L (ref 96–112)
Creatinine, Ser: 0.88 mg/dL (ref 0.40–1.50)
GFR: 88.9 mL/min (ref >60.00)
Glucose, Bld: 106 mg/dL — ABNORMAL HIGH (ref 70–99)
Potassium: 4.3 mEq/L (ref 3.5–5.1)
Sodium: 139 mEq/L (ref 135–145)
Total Bilirubin: 0.4 mg/dL (ref 0.2–1.2)
Total Protein: 6.8 g/dL (ref 6.0–8.3)

## 2021-12-20 NOTE — Assessment & Plan Note (Signed)
Chronic, well controlled, asymptomatic. Encouraged to consider flu and pneumonia shot in the future.

## 2021-12-20 NOTE — Assessment & Plan Note (Signed)
Chronic, stable and at goal. Continue amlodipine '5mg'$ /day and losartan '50mg'$ /day.

## 2021-12-20 NOTE — Assessment & Plan Note (Signed)
Incidental finding on last labs, no symptoms. Recheck CMP today, further recommendations may be made based on results.

## 2021-12-20 NOTE — Assessment & Plan Note (Signed)
Chronic, well controlled on neuropro and as needed requip.

## 2021-12-20 NOTE — Assessment & Plan Note (Signed)
Resolved, recommend f/u with dermatology if lesion returns.

## 2021-12-20 NOTE — Progress Notes (Signed)
Established Patient Office Visit  Subjective   Patient ID: Grant [name], male    DOB: May 23, 1955  Age: 66 y.o. MRN: [id number]  Chief Complaint  Patient presents with   Restless leg    Patient arrives for the above. He has restless leg syndrome currently using neuropro patches daily and requip as needed. Symptoms are stable. Has skin reaction to adhesive on neuropro patch, uses flonase nasal spray on skin prior to placing patch which prevents rash.   Also has emphysema. Not currently on medication for this. Was found incidentally on a imaging scan per patient. Declines offer to administer flu and pneumonia vaccine today. Not on oxygen.   Has hypertension. Continues on amlodipine and losartan. Tolerating well, no longer experiencing dizziness.   Reported skin lesion to left lower extremity 6 months ago, referred to dermatology and prescribed triacinolone. Unclear if he went to see them for this. Has seen them in the past for basal cell carcinoma of nose. Today, reports lesion is no longer present.  Per chart review alkaline phosphatase elevated last time labs were drawn.      Review of Systems  Respiratory:  Negative for shortness of breath.   Cardiovascular:  Negative for chest pain.  Skin:  Negative for rash.      Objective:     BP 120/64   Pulse 63   Temp 98.2 F (36.8 C) (Oral)   Ht '5\' 6"'$  (1.676 m)   Wt 180 lb 2 oz (81.7 kg)   SpO2 95%   BMI 29.07 kg/m  BP Readings from Last 3 Encounters:  12/20/21 120/64  12/12/21 124/80  11/08/21 (!) 112/58   Wt Readings from Last 3 Encounters:  12/20/21 180 lb 2 oz (81.7 kg)  12/12/21 180 lb (81.6 kg)  11/08/21 179 lb 6.4 oz (81.4 kg)      Physical Exam Vitals reviewed.  Constitutional:      Appearance: Normal appearance.  HENT:     Head: Normocephalic and atraumatic.  Cardiovascular:     Rate and Rhythm: Normal rate and regular rhythm.  Pulmonary:     Effort: Pulmonary effort is normal.     Breath sounds:  Normal breath sounds.  Musculoskeletal:     Cervical back: Neck supple.  Skin:    General: Skin is warm and dry.     Findings: No lesion.  Neurological:     Mental Status: He is alert and oriented to person, place, and time.  Psychiatric:        Mood and Affect: Mood normal.        Behavior: Behavior normal.        Thought Content: Thought content normal.        Judgment: Judgment normal.      No results found for any visits on 12/20/21.    The ASCVD Risk score (Arnett DK, et al., 2019) failed to calculate for the following reasons:   The valid total cholesterol range is 130 to 320 mg/dL    Assessment & Plan:   Problem List Items Addressed This Visit       Cardiovascular and Mediastinum   Hypertension    Chronic, stable and at goal. Continue amlodipine '5mg'$ /day and losartan '50mg'$ /day.         Respiratory   Centrilobular emphysema (HCC)    Chronic, well controlled, asymptomatic. Encouraged to consider flu and pneumonia shot in the future.         Musculoskeletal and Integument   RESOLVED: Skin  lesion    Resolved, recommend f/u with dermatology if lesion returns.         Other   Restless leg syndrome    Chronic, well controlled on neuropro and as needed requip.       High alkaline phosphatase - Primary    Incidental finding on last labs, no symptoms. Recheck CMP today, further recommendations may be made based on results.       Relevant Orders   Comprehensive metabolic panel    Return in about 6 months (around 06/21/2022) for F/U with Tina Temme.    Ailene Ards, NP

## 2022-01-20 ENCOUNTER — Encounter: Payer: Self-pay | Admitting: Family Medicine

## 2022-01-20 ENCOUNTER — Other Ambulatory Visit: Payer: Self-pay | Admitting: Nurse Practitioner

## 2022-01-20 DIAGNOSIS — I10 Essential (primary) hypertension: Secondary | ICD-10-CM

## 2022-01-21 ENCOUNTER — Other Ambulatory Visit: Payer: Self-pay | Admitting: *Deleted

## 2022-01-21 DIAGNOSIS — G4761 Periodic limb movement disorder: Secondary | ICD-10-CM

## 2022-01-21 DIAGNOSIS — G2581 Restless legs syndrome: Secondary | ICD-10-CM

## 2022-01-21 MED ORDER — NEUPRO 1 MG/24HR TD PT24: 1.0000 mg | MEDICATED_PATCH | Freq: Every day | TRANSDERMAL | 1 refills | Status: DC

## 2022-01-22 ENCOUNTER — Encounter: Payer: Self-pay | Admitting: Family Medicine

## 2022-01-25 ENCOUNTER — Encounter: Payer: Self-pay | Admitting: Gastroenterology

## 2022-01-25 ENCOUNTER — Ambulatory Visit (INDEPENDENT_AMBULATORY_CARE_PROVIDER_SITE_OTHER): Payer: Medicare Other | Admitting: Gastroenterology

## 2022-01-25 VITALS — BP 110/60 | HR 56 | Ht 66.0 in | Wt 178.0 lb

## 2022-01-25 DIAGNOSIS — K219 Gastro-esophageal reflux disease without esophagitis: Secondary | ICD-10-CM

## 2022-01-25 DIAGNOSIS — Z86010 Personal history of colonic polyps: Secondary | ICD-10-CM

## 2022-01-25 DIAGNOSIS — K649 Unspecified hemorrhoids: Secondary | ICD-10-CM | POA: Diagnosis not present

## 2022-01-25 NOTE — Progress Notes (Signed)
Chief Complaint:    Symptomatic Internal Hemorrhoids; Hemorrhoid Band Ligation  GI History: 66 year old male with history of von Willebrand's disease, anemia, HTN, dyslipidemia, CAD, emphysema, RLS, anxiety, GERD, hiatal hernia, BCC, was in GI clinic for the following:  - 05/11/2019 colonoscopy for screening purposes excellent prep 3 per hyperplastic polyps 2 to 3 mm size rectosigmoid colon, 8 mm adenomatous polyp in the rectum, diverticulosis sigmoid and transverse, nonbleeding internal hemorrhoids.  Recall 7 years  - 11/07/2021: Evaluation in GI clinic for intermittent BRBPR x2-3 months.  Occasional straining to have BM.  Exam with internal hemorrhoids.  Was treated with Anusol suppositories and cream, recommended increased fiber, hydration, sitz bath, with plan for hemorrhoid banding if no significant improvement   GERD well controlled with Protonix 40 mg/day.  HPI:     Patient is a 66 y.o. malewith a history of symptomatic internal hemorrhoids presenting to the Gastroenterology Clinic for follow-up and ongoing treatment. The patient presents with symptomatic (perianal itching, intermittent BRB on tissue paper) grade 2 hemorrhoids, unresponsive to maximal medical therapy, requesting rubber band ligation of symptomatic hemorrhoidal disease.  Some improvement with Anusol supp and cream as above. No recent bleeding, but continues to have perianal itching.  No change in medical or surgical history, medications, allergies, social history since last appointment with me.   Review of systems:     No chest pain, no SOB, no fevers, no urinary sx   Past Medical History:  Diagnosis Date   Acid reflux    Allergy    Anemia    Anxiety    Basal cell carcinoma (BCC) of nasal tip    History of hiatal hernia    Hypertension    Insomnia    RLS (restless legs syndrome)    Sensorineural hearing loss    Von Willebrand disease ([place])    PT STATES THIS IS MILD AND HAS NEVER HAD TO SEE HEMATOLOGIST-PT  DID SAY THAT WHEN HE WAS CIRCUMCIS[place] IN [place] [employer] YE[place] AGO HE BLED ALOT BUT IT WAS DUE TO NOT FOLLOWING POST OP INSTRUCTIONS     Patient's surgical history, family medical history, social history, medications and allergies were all reviewed in Epic    Current Outpatient Medications  Medication Sig Dispense Refill   amLODipine (NORVASC) 5 MG tablet Take 1 tablet (5 mg total) by mouth daily. 90 tablet 1   diclofenac Sodium (VOLTAREN) 1 % GEL Apply 2 g topically 4 (four) times daily as needed. 450 g 3   EPINEPHRINE 0.3 mg/0.3 mL IJ SOAJ injection INJECT 0.3 ML (0.3 MG) INTO THE MUSCLE ONCE FOR 1 DOSE 2 each 2   hydrocortisone (ANUSOL-HC) 2.5 % rectal cream Place 1 Application rectally 2 (two) times daily. (Patient not taking: Reported on 12/20/2021) 30 g 2   hydrocortisone (ANUSOL-HC) 25 MG suppository Place 1 suppository (25 mg total) rectally 2 (two) times daily. (Patient not taking: Reported on 12/20/2021) 12 suppository 0   lidocaine-prilocaine (EMLA) cream 1 gram qid prn 30 g 11   losartan (COZAAR) 50 MG tablet Take 1 tablet (50 mg total) by mouth daily. 90 tablet 1   pantoprazole (PROTONIX) 40 MG tablet Take 1 tablet (40 mg total) by mouth daily. 90 tablet 3   rOPINIRole (REQUIP) 0.25 MG tablet Take 1 tablet (0.25 mg total) by mouth in the morning and at bedtime. 180 tablet 3   rosuvastatin (CRESTOR) 5 MG tablet Take 1 tablet (5 mg total) by mouth daily. 90 tablet 3   Rotigotine (NEUPRO) 1  MG/24HR PT24 Place 1 patch (1 mg total) onto the skin daily at 4 PM. 90 patch 1   triamcinolone cream (KENALOG) 0.1 % Apply 1 application. topically 2 (two) times daily. 30 g 0   No current facility-administered medications for this visit.    Physical Exam:     There were no vitals taken for this visit.  GENERAL:  Pleasant male in NAD PSYCH: : Cooperative, normal affect NEURO: Alert and oriented x 3, no focal neurologic deficits Rectal exam: Sensation intact and preserved anal wink.  Small  external skin tag.  Grade 2 hemorrhoids noted in all positions on anoscopy.  No external anal fissures noted. Normal sphincter tone. No palpable mass. No blood on the exam glove. (Chaperone: Renee Rival, CMA).   IMPRESSION and PLAN:    #1.  Symptomatic internal hemorrhoids: PROCEDURE NOTE: The patient presents with symptomatic grade 2 hemorrhoids, unresponsive to maximal medical therapy, requesting rubber band ligation of symptomatic hemorrhoidal disease.  All risks, benefits and alternative forms of therapy were described and informed consent was obtained.  In the Left Lateral Decubitus position, anoscopic examination revealed grade 2 hemorrhoids in the all position(s).  The anorectum was pre-medicated with RectiCare. The decision was made to band the LL internal hemorrhoid, and the [place] was used to perform band ligation without complication.  Digital anorectal examination was then performed to assure proper positioning of the band, and to adjust the banded tissue as required.  The patient was discharged home without pain or other issues.  Dietary and behavioral recommendations were given and along with follow-up instructions.    The following adjunctive treatments were recommended:  -Resume high-fiber diet with fiber supplement (i.e. Citrucel or Benefiber) with goal for soft stools without straining to have a BM. -Resume adequate fluid intake.  The patient will return in 4 for follow-up and possible additional banding as required. No complications were encountered and the patient tolerated the procedure well.      #2.  History of colon polyps - Repeat colonoscopy in 2028 for ongoing polyp surveillance  #3.  GERD - Well-controlled on current therapy      Lavena Bullion ,DO, FACG 01/25/2022, 10:57 AM

## 2022-01-25 NOTE — Patient Instructions (Addendum)
You have been scheduled for an appointment with Dr. Bryan Lemma on 03/19/22 at 1100 am . Please arrive 10 minutes early for your appointment.   If you are age 67 or older, your body mass index should be between 23-30. Your Body mass index is 28.73 kg/m. If this is out of the aforementioned range listed, please consider follow up with your Primary Care Provider.  HEMORRHOID BANDING PROCEDURE    FOLLOW-UP CARE   The procedure you have had should have been relatively painless since the banding of the area involved does not have nerve endings and there is no pain sensation.  The rubber band cuts off the blood supply to the hemorrhoid and the band may fall off as soon as 48 hours after the banding (the band may occasionally be seen in the toilet bowl following a bowel movement). You may notice a temporary feeling of fullness in the rectum which should respond adequately to plain Tylenol or Motrin.  Following the banding, avoid strenuous exercise that evening and resume full activity the next day.  A sitz bath (soaking in a warm tub) or bidet is soothing, and can be useful for cleansing the area after bowel movements.     To avoid constipation, take two tablespoons of natural wheat bran, natural oat bran, flax, Benefiber or any over the counter fiber supplement and increase your water intake to 7-8 glasses daily.    Unless you have been prescribed anorectal medication, do not put anything inside your rectum for two weeks: No suppositories, enemas, fingers, etc.  Occasionally, you may have more bleeding than usual after the banding procedure.  This is often from the untreated hemorrhoids rather than the treated one.  Don't be concerned if there is a tablespoon or so of blood.  If there is more blood than this, lie flat with your bottom higher than your head and apply an ice pack to the area. If the bleeding does not stop within a half an hour or if you feel faint, call our office at [phone] or go  to the emergency room.  Problems are not common; however, if there is a substantial amount of bleeding, severe pain, chills, fever or difficulty passing urine (very rare) or other problems, you should call us at [phone] or report to the nearest emergency room.  Do not stay seated continuously for more than 2-3 hours for a day or two after the procedure.  Tighten your buttock muscles 10-15 times every two hours and take 10-15 deep breaths every 1-2 hours.  Do not spend more than a few minutes on the toilet if you cannot empty your bowel; instead re-visit the toilet at a later time.    __________________________________________________________  The [facility] providers would like to encourage you to use [facility] to communicate with providers for non-urgent requests or questions.  Due to long hold times on the telephone, sending your provider a message by [facility] may be a faster and more efficient way to get a response.  Please allow 48 business hours for a response.  Please remember that this is for non-urgent requests.

## 2022-01-27 IMAGING — CT CT CHEST LUNG CANCER SCREENING LOW DOSE W/O CM
2 of 5 series · 15 of 40 positions shown, 18 images · non-contrast
Comparison: No priors.

CLINICAL DATA: 65-year-old male former smoker (quit 5 years ago)
with 50 pack-year history of smoking. Lung cancer screening
examination.

EXAM:
CT CHEST WITHOUT CONTRAST LOW-DOSE FOR LUNG CANCER SCREENING
TECHNIQUE: Multidetector CT imaging of the chest was performed following the
standard protocol without IV contrast.

[Series 4: lung 1.00 br44 cor · coronal · 0.66mm/px · 3 of 283 slices shown]
[im 57/283  lung]
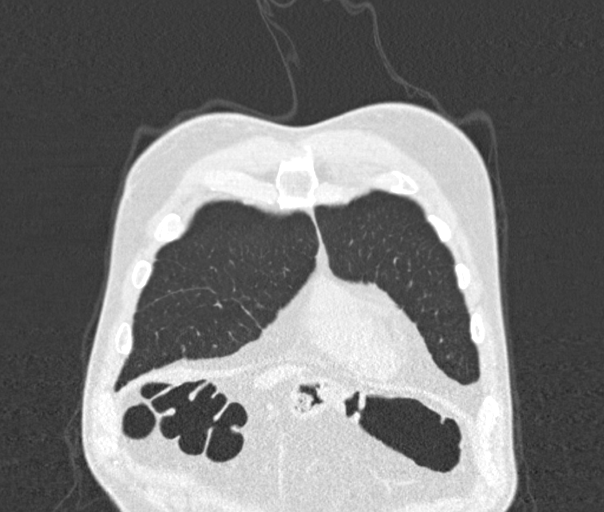
[im 113/283  lung]
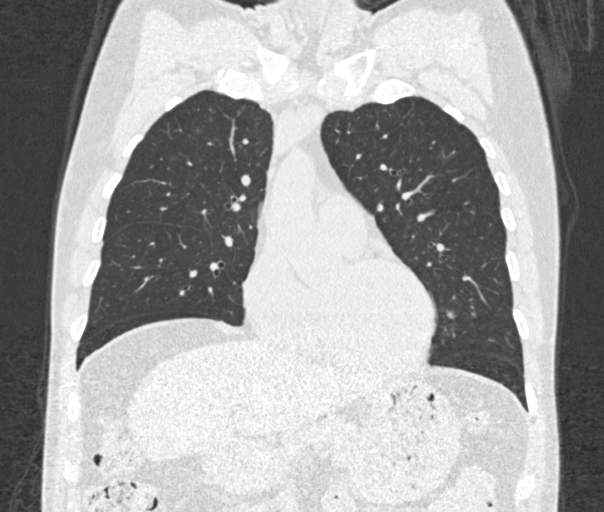
[im 170/283  lung]
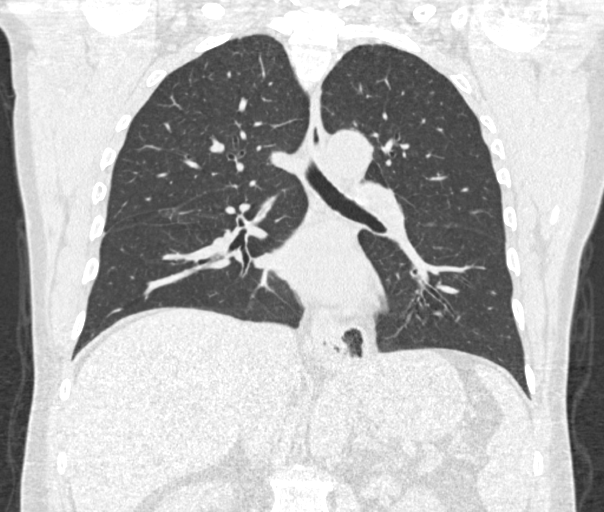

[Series 9: lung 1.00 br60 axial · axial · 0.74mm/px · z∈[-1123,-816]mm · 12 of 339 slices shown, 15 images]
[im 16/339  mediastinal]
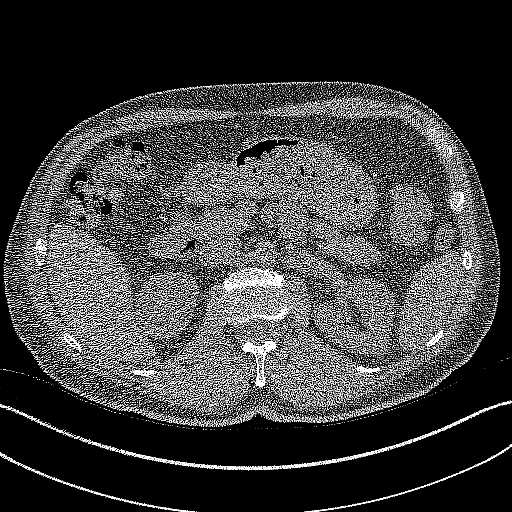
[im 16/339  lung]
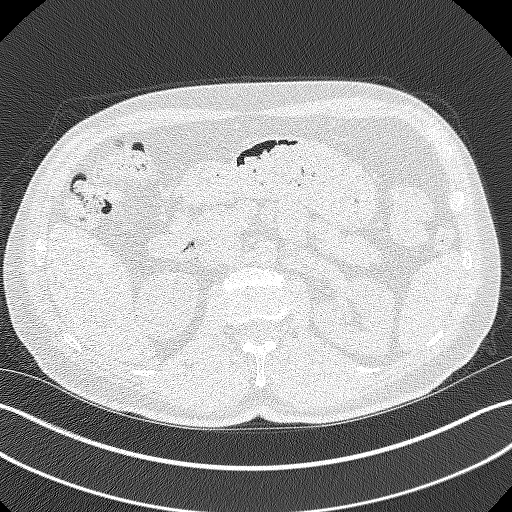
[im 47/339  lung]
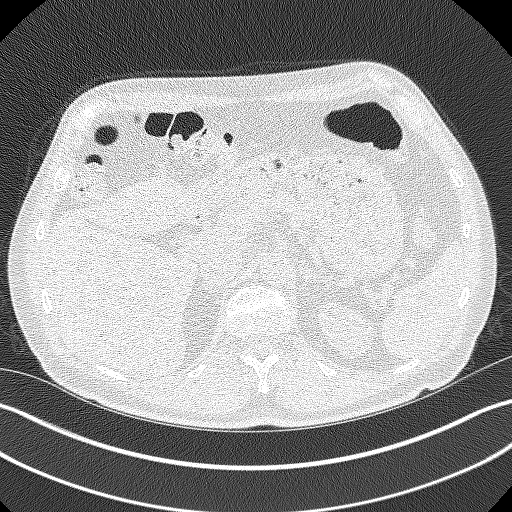
[im 77/339  lung]
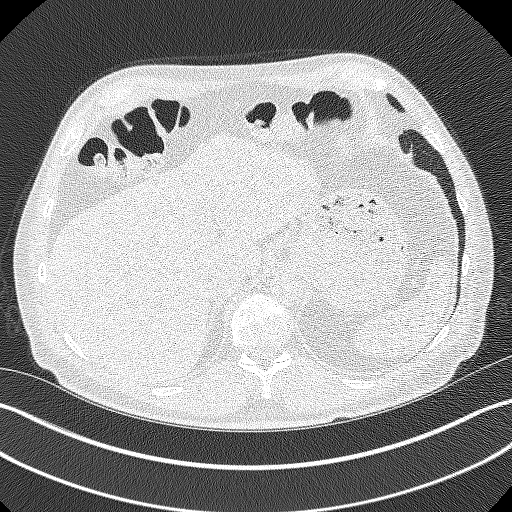
[im 108/339  lung]
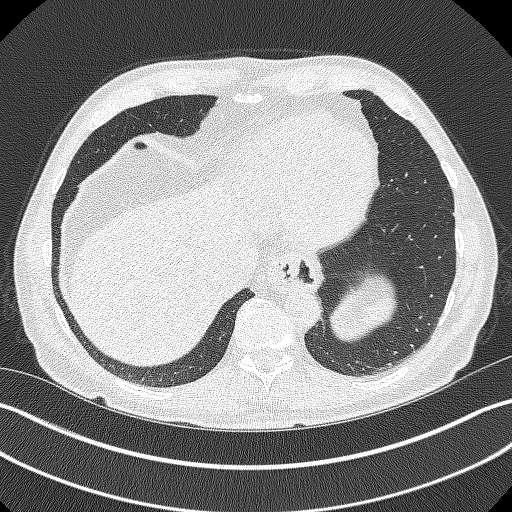
[im 123/339  mediastinal]
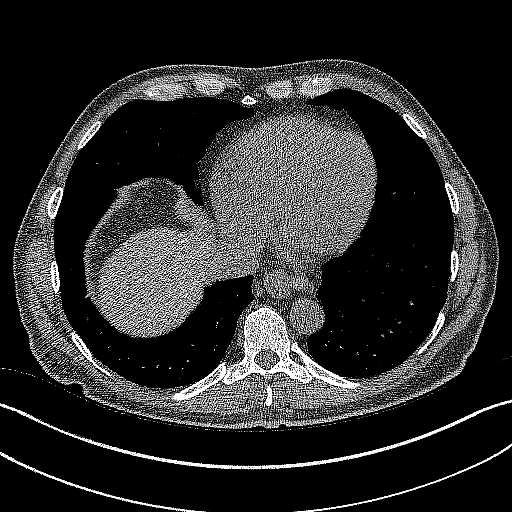
[im 123/339  lung]
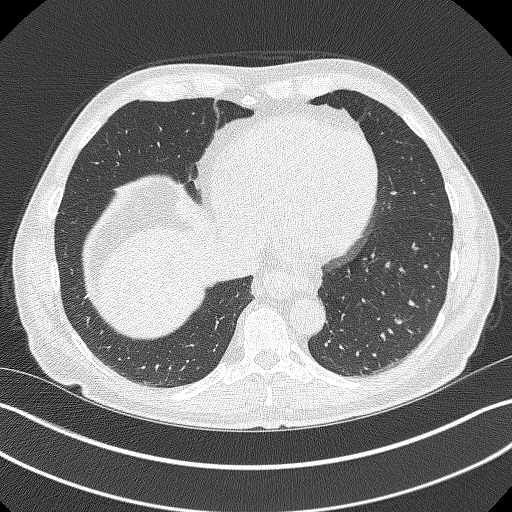
[im 154/339  lung]
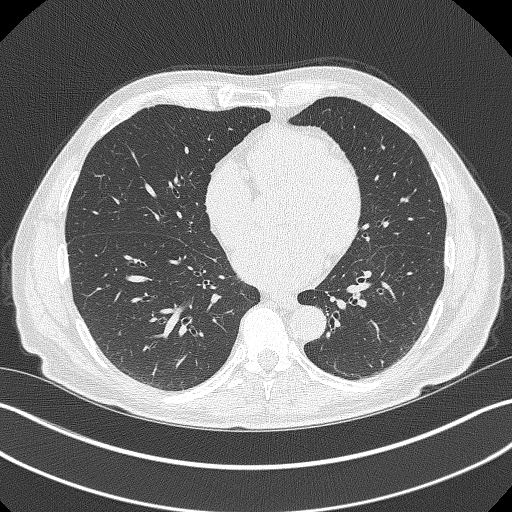
[im 185/339  lung]
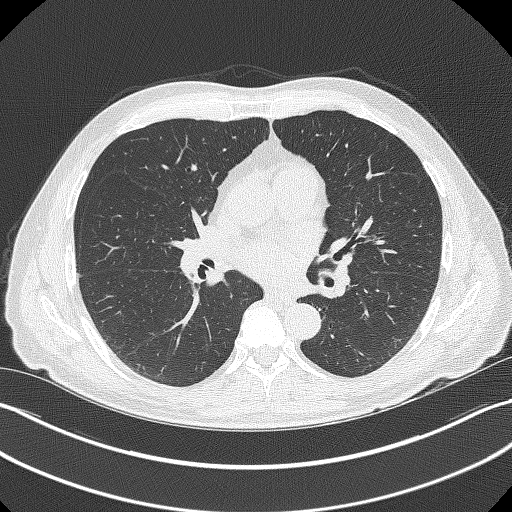
[im 216/339  lung]
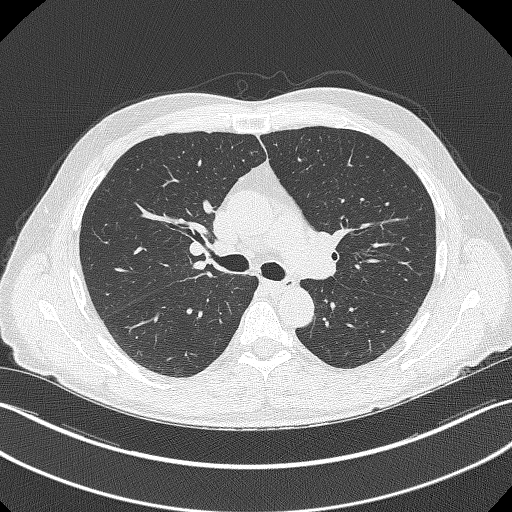
[im 231/339  mediastinal]
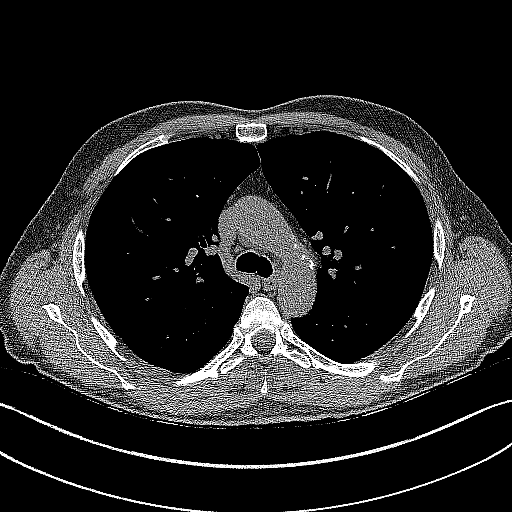
[im 231/339  lung]
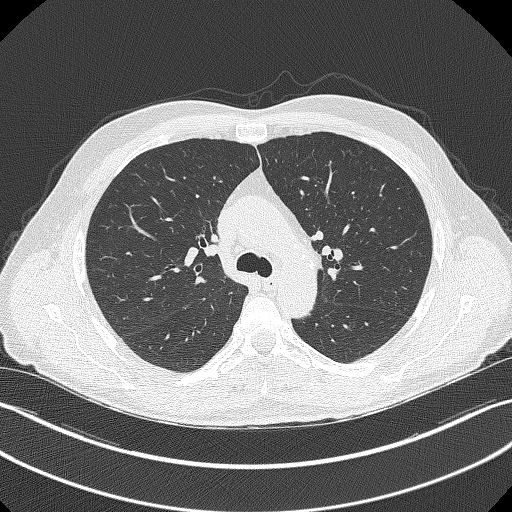
[im 262/339  lung]
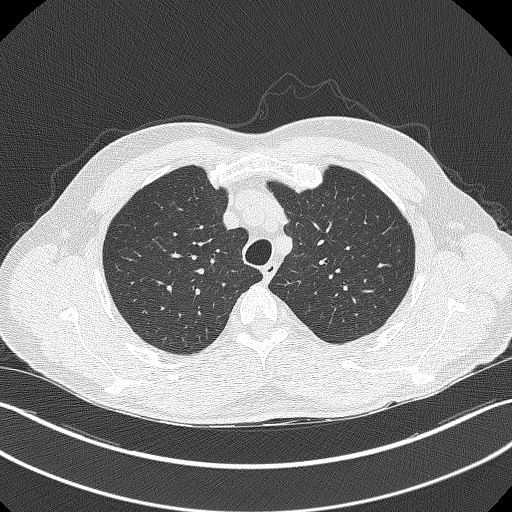
[im 292/339  lung]
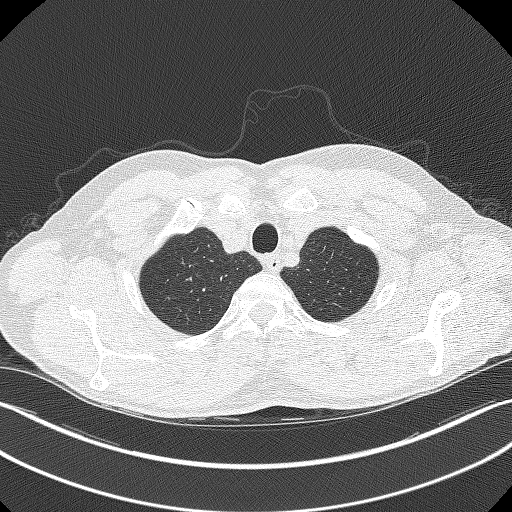
[im 323/339  lung]
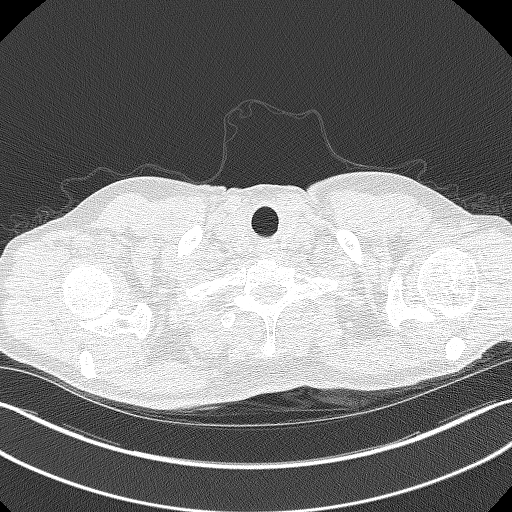

[15 of 40 positions shown; findings below may reference images not displayed]

FINDINGS: Cardiovascular: Heart size is normal. There is no significant
pericardial fluid, thickening or pericardial calcification. There is
aortic atherosclerosis, as well as atherosclerosis of the great
vessels of the mediastinum and the coronary arteries, including
calcified atherosclerotic plaque in the left main, left anterior
descending, left circumflex and right coronary arteries.

Mediastinum/Nodes: No pathologically enlarged mediastinal or hilar
lymph nodes. Please note that accurate exclusion of hilar adenopathy
is limited on noncontrast CT scans. Small hiatal hernia. No axillary
lymphadenopathy.

Lungs/Pleura: Small pulmonary nodule in the periphery of the left
upper lobe (axial image 73 of series 3), with a volume derived mean
diameter of only 1.5 mm. No other larger more suspicious appearing
pulmonary nodules or masses are noted. No acute consolidative
airspace disease. No pleural effusions. Mild diffuse bronchial wall
thickening with very mild centrilobular and paraseptal emphysema.

Upper Abdomen: Aortic atherosclerosis.

Musculoskeletal: There are no aggressive appearing lytic or blastic
lesions noted in the visualized portions of the skeleton.
IMPRESSION: 1. Lung-RADS 2S, benign appearance or behavior. Continue annual
screening with low-dose chest CT without contrast in 12 months.
2. The "S" modifier above refers to potentially clinically
significant non lung cancer related findings. Specifically, there is
aortic atherosclerosis, in addition to left main and 3 vessel
coronary artery disease. Please note that although the presence of
coronary artery calcium documents the presence of coronary artery
disease, the severity of this disease and any potential stenosis
cannot be assessed on this non-gated CT examination. Assessment for
potential risk factor modification, dietary therapy or pharmacologic
therapy may be warranted, if clinically indicated.
3. Mild diffuse bronchial wall thickening with very mild
centrilobular and paraseptal emphysema; imaging findings suggestive
of underlying COPD.

Aortic Atherosclerosis (G02PC-66T.T) and Emphysema (G02PC-5V7.0).

## 2022-01-30 ENCOUNTER — Encounter: Payer: Self-pay | Admitting: Neurology

## 2022-01-30 MED ORDER — LIDOCAINE-PRILOCAINE 2.5-2.5 % EX CREA
TOPICAL_CREAM | CUTANEOUS | 11 refills | Status: DC
Start: 2022-01-30 — End: 2023-11-26

## 2022-02-09 ENCOUNTER — Encounter: Payer: Self-pay | Admitting: Nurse Practitioner

## 2022-02-18 ENCOUNTER — Ambulatory Visit: Payer: Medicare Other | Admitting: Dermatology

## 2022-02-20 NOTE — Patient Instructions (Signed)
Below is our plan:  We will continue Neupro patch, ropinirole 0.'25mg'$  twice daily and diclofenac and lidocaine cream as needed.   Please continue using your CPAP regularly. While your insurance requires that you use CPAP at least 4 hours each night on 70% of the nights, I recommend, that you not skip any nights and use it throughout the night if you can. Getting used to CPAP and staying with the treatment long term does take time and patience and discipline. Untreated obstructive sleep apnea when it is moderate to severe can have an adverse impact on cardiovascular health and raise her risk for heart disease, arrhythmias, hypertension, congestive heart failure, stroke and diabetes. Untreated obstructive sleep apnea causes sleep disruption, nonrestorative sleep, and sleep deprivation. This can have an impact on your day to day functioning and cause daytime sleepiness and impairment of cognitive function, memory loss, mood disturbance, and problems focussing. Using CPAP regularly can improve these symptoms.  Please make sure you are staying well hydrated. I recommend 50-60 ounces daily. Well balanced diet and regular exercise encouraged. Consistent sleep schedule with 6-8 hours recommended.   Please continue follow up with care team as directed.   Follow up with me in 1 year   You may receive a survey regarding today's visit. I encourage you to leave honest feed back as I do use this information to improve patient care. Thank you for seeing me today!

## 2022-02-20 NOTE — Progress Notes (Signed)
Chief Complaint  Patient presents with   Follow-up    Pt in room #1 and alone. Pt here today to f/u with his Restless leg syndrome.    HISTORY OF PRESENT ILLNESS:  02/25/22 ALL: Grant [name] returns for follow up for OSA on CPAP, neuropathy and PLMD. He was last seen by Grant [name]. Biopsy negative. He has continued diclofenac and lidocaine cream. Neuropathy pain is improved. R>L. Neupro continued for RLS. He also takes ropinirole 0.'25mg'$  BID.RLS is well managed. He has to use Flonase at application site but feels that works well. He rotates sites. No obvious skin irritation noted. He is doing well with CPAP therapy. Now using nasal pillow. He does feel he rests better with therapy.     01/02/2021 ALL: Grant [name] returns for follow up for PLMD. He continues Neupro patch '1mg'$  daily and ropinirole 0.'25mg'$  1-2 times daily. He reports significant improvement in restless leg symptoms. He is sleeping better but does continue to wake up multiple times during the night. No obvious etiology. He monitors sleep on a Fitbit like device. He sleeps for about 5-6 hours a night. Device records that he is getting about 20% REM sleep. He feels that he continues to have excessive daytime sleepiness. He states that sleepiness waxes and wanes. ESS score is 16/24 today and he feels this is pretty accurate reflexion of his sleepiness. He has a hard time staying awake if sitting still and states he has felt like he was going to nod off while driving on occasion. He has never actually fallen asleep while driving. Split night study in 2018 that showed overall normal AHI with mild OSA in supine and REM sleep. He drinks about 2 cans of green tea daily. He is drinking 4-6 bottles of water daily. He feels decreased caffeine helps with RLS but he is more sleepy. He does wake up 2-3 times a night, usually to use restroom. Most of the time he can get back to sleep but sometimes it can take about an hour. He took melatonin several years ago but  wasn't sure it helped but admits that he was having more difficulty with RLS then. He reports getting addicted to "sleep meds" with previous PCP. He thinks it was a hypnotic.   01/03/2020 ALL:  Grant [name] is a 66 y.o. male here today for follow up for PLMD and RLS. He was switched to Neupro patch in 10/2019. He continues ropinirole 0.'25mg'$  BID as needed. He has adjusted well to using patch. He feels daytime sleepiness has improved. He was started back on gabapentin with GI, however, he did not tolerate it well. He reports more depression when taking it. He has discontinued it and feeling well.    HISTORY (copied from my note on 07/01/2019)  Grant [name] is a 66 y.o. male here today for follow up for RLS. Sleep study in 07/2016 showed normal AHI but severe PMLD. He was previously taking 13 tablets of 0.'25mg'$  ropinirole throughout the day. We swithced to extended release in 10/2018. He reports taking ropinirole XL 2 mg twice daily for a couple of months. He continues to have leg twitching. He has tried multiple different dosing times.  Most recently, he has taken ropinirole ER 2 mg at bedtime as well as ropinirole IR 0.25 mg 1-2 times throughout the day.  He feels that this regimen has been most beneficial.  Total daily dosing of 2.5 mg.    HISTORY: (copied from my note on 04/01/2019)  Grant [name] is a 66 y.o. male here today for follow up. 3,3 2at 5, 2at 9 and 3 at bedtime . He continues to have trouble with restlessness and jerking of both legs. He is taking 3 tablets every morning, 3 at lunch, 2 around 5pm, 2 at 9pm and 3 at bedtime for a total of 13 tablets daily (3.'25mg'$ .) He has had dizziness for the past couple of years but does not feel it is related to ropinirole. No side effects noted after taking ropinirole. Has tried Lyrica and gabapentin in the past but reports that he did not feel well on these. He reports suicidal ideations with both.     HISTORY: (copied from [employer] note on  11/27/2017)   Grant [name] is a 66 year old right-handed gentleman with an underlying medical history of hypertension, reflux disease, allergies, and overweight state, who presents for follow up consultation of his restless legs and PLMS. The patient is unaccompanied today. I last saw him on 11/19/2016, at which time he was taking ropinirole 0.25 mg strength at 3 different times. He had recently suffered serious dog bites that became infected and needed treatment for this as an inpatient. I suggested cautious increase in his Requip 0.25 mg strength 2 pills 3 times a day. He was advised regarding augmentation.    Today, 07/15/2017 (all dictated new, as well as above notes, some dictation done in note pad or Word, outside of chart, may appear as copied):  He reports feeling about the same, maybe a little better. He takes requip 1 pill at 6 AM (2 pills was too much in AM), 1 to 2 at 4:30 and 2 at BT, which ranges from 10 PM to MN. Sometimes he has symptoms in his arms. Sx of RLS date back to several years ago. He may have tried gabapentin in the past for Back pain, not sure if he tolerated it. He would be willing to retry it.    UPDATE 9/19/2019CM  Grant [name], 66 year old male returns for follow-up with history of restless leg syndrome.  He claims that his restless legs are a little bit worse.  He thinks his Toprol is making his restless legs worse so he has stopped the medication and wants [place] to make a suggestion however in reviewing side effects of Toprol restless legs is not a side effect.  His blood pressure is elevated in the office today at 156/61.  He was encouraged to follow-up with his primary care regarding his blood pressure.  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  His legs are more bothersome today because he had to climb a ladder.  He was started back on gabapentin after his last visit in May with Grant. Rexene Alberts, however patient states once he got to 300 mg he had suicidal idealizations and he cut  it back to 100 mg.  He has not had further suicidal idealzations on that dose.  He currently takes Requip 0.'25mg'$  3 tablets twice a day.  He has a history of anemia in the past but his most recent CBC with hemoglobin of 13.5.  He returns for reevaluation    REVIEW OF SYSTEMS: Out of a complete 14 system review of symptoms, the patient complains only of the following symptoms, RLS, daytime sleepiness, and all other reviewed systems are negative.  ESS: 8-9/24, previously 16/24   ALLERGIES: Allergies  Allergen Reactions   Bee Venom Anaphylaxis, Swelling and Rash   Asa [Aspirin] Other (See Comments)    Gi  upset   Atorvastatin Other (See Comments)    Joint pain   Duloxetine Other (See Comments)    GI upset   Zetia [Ezetimibe] Other (See Comments)    Joint pain     HOME MEDICATIONS: Outpatient Medications Prior to Visit  Medication Sig Dispense Refill   amLODipine (NORVASC) 5 MG tablet Take 1 tablet (5 mg total) by mouth daily. 90 tablet 1   diclofenac Sodium (VOLTAREN) 1 % GEL Apply 2 g topically 4 (four) times daily as needed. 450 g 3   EPINEPHRINE 0.3 mg/0.3 mL IJ SOAJ injection INJECT 0.3 ML (0.3 MG) INTO THE MUSCLE ONCE FOR 1 DOSE 2 each 2   hydrocortisone (ANUSOL-HC) 2.5 % rectal cream Place 1 Application rectally 2 (two) times daily. 30 g 2   hydrocortisone (ANUSOL-HC) 25 MG suppository Place 1 suppository (25 mg total) rectally 2 (two) times daily. 12 suppository 0   lidocaine-prilocaine (EMLA) cream 1 gram qid prn 30 g 11   losartan (COZAAR) 50 MG tablet Take 1 tablet (50 mg total) by mouth daily. 90 tablet 1   pantoprazole (PROTONIX) 40 MG tablet Take 1 tablet (40 mg total) by mouth daily. 90 tablet 3   rosuvastatin (CRESTOR) 5 MG tablet Take 1 tablet (5 mg total) by mouth daily. 90 tablet 3   Rotigotine (NEUPRO) 1 MG/24HR PT24 Place 1 patch (1 mg total) onto the skin daily at 4 PM. 90 patch 1   rOPINIRole (REQUIP) 0.25 MG tablet Take 1 tablet (0.25 mg total) by mouth in the  morning and at bedtime. 180 tablet 3   triamcinolone cream (KENALOG) 0.1 % Apply 1 application. topically 2 (two) times daily. 30 g 0   No facility-administered medications prior to visit.     PAST MEDICAL HISTORY: Past Medical History:  Diagnosis Date   Acid reflux    Allergy    Anemia    Anxiety    Basal cell carcinoma (BCC) of nasal tip    History of hiatal hernia    Hypertension    Insomnia    RLS (restless legs syndrome)    Sensorineural hearing loss    Von Willebrand disease ([place])    PT STATES THIS IS MILD AND HAS NEVER HAD TO SEE HEMATOLOGIST-PT DID SAY THAT WHEN HE WAS [place] AGO HE BLED ALOT BUT IT WAS DUE TO NOT FOLLOWING POST OP INSTRUCTIONS      PAST SURGICAL HISTORY: Past Surgical History:  Procedure Laterality Date   CIRCUMCISION     AS AN ADULT   COLONOSCOPY     FOOT SURGERY Right    arch support   HERNIA REPAIR     INCISION AND DRAINAGE Right 02/04/2013   Procedure: INCISION AND DRAINAGE;  Surgeon: Linna Hoff, MD;  Location: [place];  Service: Orthopedics;  Laterality: Right;   INGUINAL HERNIA REPAIR Bilateral 05/13/2018   Procedure: LAPAROSCOPIC BILATERAL INGUINAL HERNIA REPAIR;  Surgeon: Olean Ree, MD;  Location: [facility];  Service: General;  Laterality: Bilateral;   INGUINAL HERNIA REPAIR Right 06/17/2019   Procedure: HERNIA REPAIR INGUINAL ADULT WITH MESH-- Recurrent;  Surgeon: Olean Ree, MD;  Location: [facility];  Service: General;  Laterality: Right;   OPEN REDUCTION INTERNAL FIXATION (ORIF) FINGER WITH RADIAL BONE GRAFT Right 02/04/2013   Procedure: OPEN REDUCTION INTERNAL FIXATION (ORIF) right ring and small fingers;  Surgeon: Linna Hoff, MD;  Location: [place];  Service: Orthopedics;  Laterality: Right;     FAMILY HISTORY: Family History  Problem Relation Age of Onset   Hypertension Mother    Hyperlipidemia Mother    Hypertension Father    Hyperlipidemia Father    Stroke Father        x2   Colon cancer Neg Hx     Esophageal cancer Neg Hx    Rectal cancer Neg Hx    Stomach cancer Neg Hx    Sleep apnea Neg Hx    Neuropathy Neg Hx    Pancreatic cancer Neg Hx      SOCIAL HISTORY: Social History   Socioeconomic History   Marital status: Married    Spouse name: Not on file   Number of children: 4   Years of education: 12   Highest education level: Not on file  Occupational History   Occupation: Sales executive   Tobacco Use   Smoking status: Former    Packs/day: 0.50    Years: 40.00    Total pack years: 20.00    Types: Cigarettes    Quit date: 05/10/2016    Years since quitting: 5.8   Smokeless tobacco: Never  Vaping Use   Vaping Use: Former   Quit date: 05/10/2016  Substance and Sexual Activity   Alcohol use: No   Drug use: Yes    Types: Marijuana    Comment: uses daily   Sexual activity: Not on file  Other Topics Concern   Not on file  Social History Narrative   Denies caffeine use    Social Determinants of Health   Financial Resource Strain: Low Risk  (12/12/2021)   Overall Financial Resource Strain (CARDIA)    Difficulty of Paying Living Expenses: Not hard at all  Food Insecurity: No Food Insecurity (12/12/2021)   Hunger Vital Sign    Worried About Running Out of Food in the Last Year: Never true    Ran Out of Food in the Last Year: Never true  Transportation Needs: No Transportation Needs (12/12/2021)   PRAPARE - Hydrologist (Medical): No    Lack of Transportation (Non-Medical): No  Physical Activity: Not on file  Stress: No Stress Concern Present (12/12/2021)   [place]    Feeling of Stress : Not at all  Social Connections: [place] (12/12/2021)   Social Connection and Isolation Panel [NHANES]    Frequency of Communication with Friends and Family: More than three times a week    Frequency of Social Gatherings with Friends and Family: More than three times a week     Attends Religious Services: More than 4 times per year    Active Member of [employer] or Organizations: Yes    Attends Music therapist: More than 4 times per year    Marital Status: Married  Human resources officer Violence: Not At Risk (12/12/2021)   Humiliation, Afraid, Rape, and Kick questionnaire    Fear of Current or Ex-Partner: No    Emotionally Abused: No    Physically Abused: No    Sexually Abused: No      PHYSICAL EXAM  Vitals:   02/25/22 1432  BP: 122/68  Pulse: 64  Weight: 175 lb 8 oz (79.6 kg)  Height: '5\' 6"'$  (1.676 m)     Body mass index is 28.33 kg/m.   Generalized: Well developed, in no acute distress   Neurological examination  Mentation: Alert oriented to time, place, history taking. Follows all commands speech and language fluent Cranial nerve II-XII: Pupils were equal  round reactive to light. Extraocular movements were full, visual field were full on confrontational test. Facial sensation and strength were normal. Head turning and shoulder shrug  were normal and symmetric. Motor: The motor testing reveals 5 over 5 strength of all 4 extremities. Good symmetric motor tone is noted throughout.  Sensory: Sensory testing is intact to soft touch on all 4 extremities. No evidence of extinction is noted.  Coordination: Cerebellar testing reveals good finger-nose-finger and heel-to-shin bilaterally.  Gait and station: Gait is normal.  Reflexes: Deep tendon reflexes are symmetric and normal bilaterally.     DIAGNOSTIC DATA (LABS, IMAGING, TESTING) - I reviewed patient records, labs, notes, testing and imaging myself where available.  Lab Results  Component Value Date   WBC 6.0 06/07/2021   HGB 14.3 06/07/2021   HCT 42.3 06/07/2021   MCV 91.3 06/07/2021   PLT 253.0 06/07/2021      Component Value Date/Time   NA 139 12/20/2021 0835   NA 142 07/03/2021 1535   K 4.3 12/20/2021 0835   CL 105 12/20/2021 0835   CO2 29 12/20/2021 0835   GLUCOSE 106 (H)  12/20/2021 0835   BUN 19 12/20/2021 0835   BUN 14 07/03/2021 1535   CREATININE 0.88 12/20/2021 0835   CREATININE 0.88 03/26/2021 1106   CALCIUM 9.3 12/20/2021 0835   PROT 6.8 12/20/2021 0835   PROT 6.7 07/03/2021 1535   ALBUMIN 4.1 12/20/2021 0835   ALBUMIN 4.7 07/03/2021 1535   AST 19 12/20/2021 0835   ALT 24 12/20/2021 0835   ALKPHOS 91 12/20/2021 0835   BILITOT 0.4 12/20/2021 0835   BILITOT 0.4 07/03/2021 1535   GFRNONAA 83 10/25/2019 0817   GFRAA 96 10/25/2019 0817   Lab Results  Component Value Date   CHOL 152 04/24/2021   HDL 43 04/24/2021   LDLCALC 97 04/24/2021   TRIG 56 04/24/2021   CHOLHDL 3.5 04/24/2021   Lab Results  Component Value Date   HGBA1C 5.6 07/03/2021   Lab Results  Component Value Date   VITAMINB12 539 07/24/2021   Lab Results  Component Value Date   TSH 1.58 03/26/2021      ASSESSMENT AND PLAN  66 y.o. year old male  has a past medical history of Acid reflux, Allergy, Anemia, Anxiety, Basal cell carcinoma (BCC) of nasal tip, History of hiatal hernia, Hypertension, Insomnia, RLS (restless legs syndrome), Sensorineural hearing loss, and Von Willebrand disease ([place]). here with   OSA on CPAP - Plan: For home use only DME continuous positive airway pressure (CPAP)  PLMD (periodic limb movement disorder) - Plan: rOPINIRole (REQUIP) 0.25 MG tablet  Neuropathy  Restless leg syndrome - Plan: rOPINIRole (REQUIP) 0.25 MG tablet  Tom is doing well on Neupro patch daily and ropinirole 0.'25mg'$  BID as needed. He notes significant improvement in leg pain and movements. We will continue current treatment plan. He will continue to alternate site to prevent skin irritation. May use Flonase at site prior to application if he wishes. He will continue diclofenac and lidocaine topically for neuropathy. Compliance data shows excellent compliance. AHI well managed. He was encouraged to continue using CPAP nightly for at least 4 hours. He was encouraged to continue  working on healthy lifestyle habits. He will follow up with me in 1 year. He verbalizes understanding and agreement with this plan.   Orders Placed This Encounter  Procedures   For home use only DME continuous positive airway pressure (CPAP)    Supplies    Order Specific Question:  Length of Need    Answer:   Lifetime    Order Specific Question:   Patient has OSA or probable OSA    Answer:   Yes    Order Specific Question:   Is the patient currently using CPAP in the home    Answer:   Yes    Order Specific Question:   Settings    Answer:   Other see comments    Order Specific Question:   CPAP supplies needed    Answer:   Mask, headgear, cushions, filters, heated tubing and water chamber     Meds ordered this encounter  Medications   rOPINIRole (REQUIP) 0.25 MG tablet    Sig: Take 1 tablet (0.25 mg total) by mouth in the morning and at bedtime.    Dispense:  180 tablet    Refill:  3    Order Specific Question:   Supervising Provider    Answer:   Melvenia Beam [[id number]]      Debbora Presto, MSN, FNP-C 02/25/2022, 3:08 PM  [facility] [address] [place], [place] [address] [phone]

## 2022-02-25 ENCOUNTER — Ambulatory Visit (INDEPENDENT_AMBULATORY_CARE_PROVIDER_SITE_OTHER): Payer: Medicare Other | Admitting: Family Medicine

## 2022-02-25 ENCOUNTER — Ambulatory Visit: Payer: Medicare Other | Admitting: Family Medicine

## 2022-02-25 ENCOUNTER — Encounter: Payer: Self-pay | Admitting: Family Medicine

## 2022-02-25 VITALS — BP 122/68 | HR 64 | Ht 66.0 in | Wt 175.5 lb

## 2022-02-25 DIAGNOSIS — G4733 Obstructive sleep apnea (adult) (pediatric): Secondary | ICD-10-CM

## 2022-02-25 DIAGNOSIS — G2581 Restless legs syndrome: Secondary | ICD-10-CM | POA: Diagnosis not present

## 2022-02-25 DIAGNOSIS — G629 Polyneuropathy, unspecified: Secondary | ICD-10-CM | POA: Diagnosis not present

## 2022-02-25 DIAGNOSIS — G4761 Periodic limb movement disorder: Secondary | ICD-10-CM

## 2022-02-25 MED ORDER — ROPINIROLE HCL 0.25 MG PO TABS: 0.2500 mg | ORAL_TABLET | Freq: Two times a day (BID) | ORAL | 3 refills | Status: DC

## 2022-02-26 ENCOUNTER — Telehealth: Payer: Self-pay

## 2022-03-13 ENCOUNTER — Other Ambulatory Visit: Payer: Self-pay | Admitting: Nurse Practitioner

## 2022-03-13 DIAGNOSIS — I10 Essential (primary) hypertension: Secondary | ICD-10-CM

## 2022-03-19 ENCOUNTER — Encounter: Payer: Self-pay | Admitting: Gastroenterology

## 2022-03-19 ENCOUNTER — Ambulatory Visit (INDEPENDENT_AMBULATORY_CARE_PROVIDER_SITE_OTHER): Payer: Medicare Other | Admitting: Gastroenterology

## 2022-03-19 VITALS — BP 130/70 | HR 52 | Ht 66.0 in | Wt 176.8 lb

## 2022-03-19 DIAGNOSIS — K649 Unspecified hemorrhoids: Secondary | ICD-10-CM

## 2022-03-19 NOTE — Progress Notes (Signed)
Chief Complaint:    Symptomatic Internal Hemorrhoids; Hemorrhoid Band Ligation  GI History: 67 year old male with history of von Willebrand's disease, anemia, HTN, dyslipidemia, CAD, emphysema, RLS, anxiety, GERD, hiatal hernia, BCC, was in GI clinic for the following:   - 05/11/2019 colonoscopy for screening purposes excellent prep 3 per hyperplastic polyps 2 to 3 mm size rectosigmoid colon, 8 mm adenomatous polyp in the rectum, diverticulosis sigmoid and transverse, nonbleeding internal hemorrhoids.  Recall 7 years  - 11/07/2021: Evaluation in GI clinic for intermittent BRBPR x2-3 months.  Occasional straining to have BM.  Exam with internal hemorrhoids.  Was treated with Anusol suppositories and cream, recommended increased fiber, hydration, sitz bath, with plan for hemorrhoid banding if no significant improvement - 01/25/2022: Banding of LL hemorrhoid      GERD well controlled with Protonix 40 mg/day.  HPI:     Patient is a 67 y.o. malewith a history of symptomatic internal hemorrhoids presenting to the Gastroenterology Clinic for follow-up and ongoing treatment. The patient presents with symptomatic grade 2 hemorrhoids, unresponsive to maximal medical therapy, requesting rubber band ligation of symptomatic hemorrhoidal disease.  Has had improvement in hemorrhoidal symptoms with the first hemorrhoid banding.  No change in medical or surgical history, medications, allergies, social history since last appointment with me.   Review of systems:     No chest pain, no SOB, no fevers, no urinary sx   Past Medical History:  Diagnosis Date   Acid reflux    Allergy    Anemia    Anxiety    Basal cell carcinoma (BCC) of nasal tip    History of hiatal hernia    Hypertension    Insomnia    RLS (restless legs syndrome)    Sensorineural hearing loss    Von Willebrand disease ([place])    PT STATES THIS IS MILD AND HAS NEVER HAD TO SEE HEMATOLOGIST-PT DID SAY THAT WHEN HE WAS [place]  [place] AGO HE BLED ALOT BUT IT WAS DUE TO NOT FOLLOWING POST OP INSTRUCTIONS     Patient's surgical history, family medical history, social history, medications and allergies were all reviewed in Epic    Current Outpatient Medications  Medication Sig Dispense Refill   amLODipine (NORVASC) 5 MG tablet Take 1 tablet (5 mg total) by mouth daily. 90 tablet 1   diclofenac Sodium (VOLTAREN) 1 % GEL Apply 2 g topically 4 (four) times daily as needed. 450 g 3   EPINEPHRINE 0.3 mg/0.3 mL IJ SOAJ injection INJECT 0.3 ML (0.3 MG) INTO THE MUSCLE ONCE FOR 1 DOSE 2 each 2   hydrocortisone (ANUSOL-HC) 2.5 % rectal cream Place 1 Application rectally 2 (two) times daily. 30 g 2   hydrocortisone (ANUSOL-HC) 25 MG suppository Place 1 suppository (25 mg total) rectally 2 (two) times daily. 12 suppository 0   lidocaine-prilocaine (EMLA) cream 1 gram qid prn 30 g 11   losartan (COZAAR) 50 MG tablet Take 1 tablet (50 mg total) by mouth daily. 90 tablet 1   pantoprazole (PROTONIX) 40 MG tablet Take 1 tablet (40 mg total) by mouth daily. 90 tablet 3   rOPINIRole (REQUIP) 0.25 MG tablet Take 1 tablet (0.25 mg total) by mouth in the morning and at bedtime. 180 tablet 3   rosuvastatin (CRESTOR) 5 MG tablet Take 1 tablet (5 mg total) by mouth daily. 90 tablet 3   Rotigotine (NEUPRO) 1 MG/24HR PT24 Place 1 patch (1 mg total) onto the skin daily at 4 PM. 90  patch 1   No current facility-administered medications for this visit.    Physical Exam:     BP 130/70 (BP Location: Right Arm, Patient Position: Sitting, Cuff Size: Normal)   Pulse (!) 52   Ht '5\' 6"'$  (1.676 m)   Wt 176 lb 12.8 oz (80.2 kg)   SpO2 97%   BMI 28.54 kg/m   GENERAL:  Pleasant male in NAD PSYCH: : Cooperative, normal affect NEURO: Alert and oriented x 3, no focal neurologic deficits Rectal exam: Sensation intact and preserved anal wink.  Grade 2 hemorrhoids noted RP and RA positions on exam.  No external anal fissures noted. Normal  sphincter tone. No palpable mass. No blood on the exam glove. (Chaperone: Renee Rival, CMA).   IMPRESSION and PLAN:    #1.  Symptomatic internal hemorrhoids: PROCEDURE NOTE: The patient presents with symptomatic grade 2 hemorrhoids, unresponsive to maximal medical therapy, requesting rubber band ligation of symptomatic hemorrhoidal disease.  All risks, benefits and alternative forms of therapy were described and informed consent was obtained.  In the Left Lateral Decubitus position, anoscopic examination revealed grade 2 hemorrhoids in the RP and RA position(s).  The anorectum was pre-medicated with RectiCare. The decision was made to band the RP internal hemorrhoid, and the [place] was used to perform band ligation without complication.  Digital anorectal examination was then performed to assure proper positioning of the band, and to adjust the banded tissue as required.  A few minutes after band placement, he did develop exquisite discomfort.  The band was adjusted (not removed) with quick resolution of pain. The patient was discharged home without pain or other issues.  Dietary and behavioral recommendations were given and along with follow-up instructions.     The following adjunctive treatments were recommended:  -Resume high-fiber diet with fiber supplement (i.e. Citrucel or Benefiber) with goal for soft stools without straining to have a BM. -Resume adequate fluid intake.  The patient will return in 4 for follow-up and possible additional banding as required.  The patient was called later in the afternoon and confirmed that the pain had not returned and he was back to feeling his usual state of health again and very appreciative for the follow-up phone call.      #2.  History of colon polyps - Repeat colonoscopy in 2028 for ongoing polyp surveillance   #3.  GERD - Well-controlled on current therapy      Lavena Bullion ,DO, FACG 03/19/2022, 11:15 AM

## 2022-03-19 NOTE — Patient Instructions (Addendum)
You have been scheduled for an appointment with Dr. Bryan Lemma on 04/26/21  at 11:20 am . Please arrive 10 minutes early for your appointment.    HEMORRHOID BANDING PROCEDURE    FOLLOW-UP CARE   The procedure you have had should have been relatively painless since the banding of the area involved does not have nerve endings and there is no pain sensation.  The rubber band cuts off the blood supply to the hemorrhoid and the band may fall off as soon as 48 hours after the banding (the band may occasionally be seen in the toilet bowl following a bowel movement). You may notice a temporary feeling of fullness in the rectum which should respond adequately to plain Tylenol or Motrin.  Following the banding, avoid strenuous exercise that evening and resume full activity the next day.  A sitz bath (soaking in a warm tub) or bidet is soothing, and can be useful for cleansing the area after bowel movements.     To avoid constipation, take two tablespoons of natural wheat bran, natural oat bran, flax, Benefiber or any over the counter fiber supplement and increase your water intake to 7-8 glasses daily.    Unless you have been prescribed anorectal medication, do not put anything inside your rectum for two weeks: No suppositories, enemas, fingers, etc.  Occasionally, you may have more bleeding than usual after the banding procedure.  This is often from the untreated hemorrhoids rather than the treated one.  Don't be concerned if there is a tablespoon or so of blood.  If there is more blood than this, lie flat with your bottom higher than your head and apply an ice pack to the area. If the bleeding does not stop within a half an hour or if you feel faint, call our office at [phone] or go to the emergency room.  Problems are not common; however, if there is a substantial amount of bleeding, severe pain, chills, fever or difficulty passing urine (very rare) or other problems, you should call us at  [phone] or report to the nearest emergency room.  Do not stay seated continuously for more than 2-3 hours for a day or two after the procedure.  Tighten your buttock muscles 10-15 times every two hours and take 10-15 deep breaths every 1-2 hours.  Do not spend more than a few minutes on the toilet if you cannot empty your bowel; instead re-visit the toilet at a later time.    If you are age 64 or younger, your body mass index should be between 19-25. Your Body mass index is 28.54 kg/m. If this is out of the aformentioned range listed, please consider follow up with your Primary Care Provider.   __________________________________________________________  The [facility] providers would like to encourage you to use [facility] to communicate with providers for non-urgent requests or questions.  Due to long hold times on the telephone, sending your provider a message by [facility] may be a faster and more efficient way to get a response.  Please allow 48 business hours for a response.  Please remember that this is for non-urgent requests.   Due to recent changes in healthcare laws, you may see the results of your imaging and laboratory studies on MyChart before your provider has had a chance to review them.  We understand that in some cases there may be results that are confusing or concerning to you. Not all laboratory results come back in the same time frame and  the provider may be waiting for multiple results in order to interpret others.  Please give [place] 48 hours in order for your provider to thoroughly review all the results before contacting the office for clarification of your results.

## 2022-04-02 ENCOUNTER — Encounter: Payer: Self-pay | Admitting: Nurse Practitioner

## 2022-04-03 ENCOUNTER — Other Ambulatory Visit: Payer: Self-pay

## 2022-04-03 DIAGNOSIS — I10 Essential (primary) hypertension: Secondary | ICD-10-CM

## 2022-04-03 MED ORDER — LOSARTAN POTASSIUM 50 MG PO TABS: 50.0000 mg | ORAL_TABLET | Freq: Every day | ORAL | 1 refills | Status: DC

## 2022-04-08 ENCOUNTER — Other Ambulatory Visit: Payer: Self-pay

## 2022-04-08 ENCOUNTER — Encounter: Payer: Self-pay | Admitting: Internal Medicine

## 2022-04-08 DIAGNOSIS — I10 Essential (primary) hypertension: Secondary | ICD-10-CM

## 2022-04-08 MED ORDER — AMLODIPINE BESYLATE 5 MG PO TABS: 5.0000 mg | ORAL_TABLET | Freq: Every day | ORAL | 1 refills | Status: DC

## 2022-04-08 MED ORDER — ROSUVASTATIN CALCIUM 5 MG PO TABS: 5.0000 mg | ORAL_TABLET | Freq: Every day | ORAL | 3 refills | Status: DC

## 2022-04-08 NOTE — Telephone Encounter (Signed)
Medication was send in, pt said [facility] contacted our office in regards to refill, let pt know we did not receive their called but medication was send in ealry this month, but seem to not have went through, medication resend and made pt aware to contact us if there is an issue

## 2022-04-09 ENCOUNTER — Encounter: Payer: Self-pay | Admitting: Family Medicine

## 2022-04-09 ENCOUNTER — Encounter: Payer: Self-pay | Admitting: Nurse Practitioner

## 2022-04-13 ENCOUNTER — Encounter: Payer: Self-pay | Admitting: Family Medicine

## 2022-04-25 ENCOUNTER — Encounter: Payer: Self-pay | Admitting: Gastroenterology

## 2022-04-26 ENCOUNTER — Ambulatory Visit: Payer: Medicare Other | Admitting: Gastroenterology

## 2022-05-07 ENCOUNTER — Ambulatory Visit (INDEPENDENT_AMBULATORY_CARE_PROVIDER_SITE_OTHER): Payer: Medicare Other | Admitting: Gastroenterology

## 2022-05-07 ENCOUNTER — Encounter: Payer: Self-pay | Admitting: Gastroenterology

## 2022-05-07 VITALS — BP 120/60 | HR 75 | Ht 66.0 in | Wt 179.0 lb

## 2022-05-07 DIAGNOSIS — K649 Unspecified hemorrhoids: Secondary | ICD-10-CM | POA: Diagnosis not present

## 2022-05-07 NOTE — Progress Notes (Signed)
Chief Complaint:    Symptomatic Internal Hemorrhoids; Hemorrhoid Band Ligation  GI History: 67 year old male with history of von Willebrand's disease, anemia, HTN, dyslipidemia, CAD, emphysema, RLS, anxiety, GERD, hiatal hernia, BCC, was in GI clinic for the following:   - 05/11/2019 colonoscopy for screening purposes excellent prep 3 per hyperplastic polyps 2 to 3 mm size rectosigmoid colon, 8 mm adenomatous polyp in the rectum, diverticulosis sigmoid and transverse, nonbleeding internal hemorrhoids.  Recall 7 years  - 11/07/2021: Evaluation in GI clinic for intermittent BRBPR x2-3 months.  Occasional straining to have BM.  Exam with internal hemorrhoids.  Was treated with Anusol suppositories and cream, recommended increased fiber, hydration, sitz bath, with plan for hemorrhoid banding if no significant improvement - 01/25/2022: Banding of LL hemorrhoid - 03/19/2022: Banding of RP hemorrhoid - 05/07/2022: Presents for hemorrhoid banding #3       GERD well controlled with Protonix 40 mg/day.  HPI:     Patient is a 67 y.o. malewith a history of symptomatic internal hemorrhoids presenting to the Gastroenterology Clinic for follow-up and ongoing treatment. The patient presents with symptomatic grade 2 hemorrhoids, unresponsive to maximal medical therapy, requesting rubber band ligation of symptomatic hemorrhoidal disease.  Has done well with each of the first 2 hemorrhoid banding's.  Significant improvement in hemorrhoidal symptoms, now with only rare pruritus ani.  No change in medical or surgical history, medications, allergies, social history since last appointment with me.   Review of systems:     No chest pain, no SOB, no fevers, no urinary sx   Past Medical History:  Diagnosis Date   Acid reflux    Allergy    Anemia    Anxiety    Basal cell carcinoma (BCC) of nasal tip    History of hiatal hernia    Hypertension    Insomnia    RLS (restless legs syndrome)    Sensorineural  hearing loss    Von Willebrand disease ([place])    PT STATES THIS IS MILD AND HAS NEVER HAD TO SEE HEMATOLOGIST-PT DID SAY THAT WHEN HE WAS CIRC[place] IN [place] YE[place] AGO HE BLED ALOT BUT IT WAS DUE TO NOT FOLLOWING POST OP INSTRUCTIONS     Patient's surgical history, family medical history, social history, medications and allergies were all reviewed in Epic    Current Outpatient Medications  Medication Sig Dispense Refill   amLODipine (NORVASC) 5 MG tablet Take 1 tablet (5 mg total) by mouth daily. 90 tablet 1   diclofenac Sodium (VOLTAREN) 1 % GEL Apply 2 g topically 4 (four) times daily as needed. 450 g 3   EPINEPHRINE 0.3 mg/0.3 mL IJ SOAJ injection INJECT 0.3 ML (0.3 MG) INTO THE MUSCLE ONCE FOR 1 DOSE 2 each 2   hydrocortisone (ANUSOL-HC) 2.5 % rectal cream Place 1 Application rectally 2 (two) times daily. 30 g 2   hydrocortisone (ANUSOL-HC) 25 MG suppository Place 1 suppository (25 mg total) rectally 2 (two) times daily. 12 suppository 0   lidocaine-prilocaine (EMLA) cream 1 gram qid prn 30 g 11   losartan (COZAAR) 50 MG tablet Take 1 tablet (50 mg total) by mouth daily. 90 tablet 1   pantoprazole (PROTONIX) 40 MG tablet Take 1 tablet (40 mg total) by mouth daily. 90 tablet 3   rOPINIRole (REQUIP) 0.25 MG tablet Take 1 tablet (0.25 mg total) by mouth in the morning and at bedtime. 180 tablet 3   rosuvastatin (CRESTOR) 5 MG tablet Take 1 tablet (5 mg total) by  mouth daily. 90 tablet 3   Rotigotine (NEUPRO) 1 MG/24HR PT24 Place 1 patch (1 mg total) onto the skin daily at 4 PM. 90 patch 1   No current facility-administered medications for this visit.    Physical Exam:     There were no vitals taken for this visit.  GENERAL:  Pleasant male in NAD PSYCH: : Cooperative, normal affect Rectal exam: Sensation intact and preserved anal wink.  Grade 1-2 hemorrhoid noted in RA position only on exam.  No external anal fissures noted. Normal sphincter tone. No palpable mass. No blood on the  exam glove. (Chaperone: Renee Rival, CMA).   IMPRESSION and PLAN:    #1.  Symptomatic internal hemorrhoids: PROCEDURE NOTE: The patient presents with symptomatic grade 2 hemorrhoids, unresponsive to maximal medical therapy, requesting rubber band ligation of symptomatic hemorrhoidal disease.  All risks, benefits and alternative forms of therapy were described and informed consent was obtained.  In the Left Lateral Decubitus position, anoscopic examination revealed grade 1-2 hemorrhoids in the RA position(s).  The anorectum was pre-medicated with RectiCare. The decision was made to band the RA internal hemorrhoid, and the [place] was used to perform band ligation without complication.  Digital anorectal examination was then performed to assure proper positioning of the band, and to adjust the banded tissue as required.  The patient was discharged home without pain or other issues.  Dietary and behavioral recommendations were given and along with follow-up instructions.     The following adjunctive treatments were recommended:  -Resume high-fiber diet with fiber supplement (i.e. Citrucel or Benefiber) with goal for soft stools without straining to have a BM. -Resume adequate fluid intake.  The patient will return as needed if any recurrence of hemorrhoidal symptoms for evaluation and possible additional banding as required. No complications were encountered and the patient tolerated the procedure well.   #2.  History of colon polyps - Repeat colonoscopy in 2028 for ongoing polyp surveillance   #3.  GERD - Well-controlled on current therapy   RTC prn      Lavena Bullion ,DO, FACG 05/07/2022, 8:22 AM

## 2022-05-07 NOTE — Patient Instructions (Signed)
_______________________________________________________  If your blood pressure at your visit was 140/90 or greater, please contact your primary care physician to follow up on this.  _______________________________________________________  If you are age 68 or older, your body mass index should be between 23-30. Your Body mass index is 28.89 kg/m. If this is out of the aforementioned range listed, please consider follow up with your Primary Care Provider.  __________________________________________________________  The [facility] providers would like to encourage you to use [facility] to communicate with providers for non-urgent requests or questions.  Due to long hold times on the telephone, sending your provider a message by [facility] may be a faster and more efficient way to get a response.  Please allow 48 business hours for a response.  Please remember that this is for non-urgent requests.   Due to recent changes in healthcare laws, you may see the results of your imaging and laboratory studies on MyChart before your provider has had a chance to review them.  We understand that in some cases there may be results that are confusing or concerning to you. Not all laboratory results come back in the same time frame and the provider may be waiting for multiple results in order to interpret others.  Please give [place] 48 hours in order for your provider to thoroughly review all the results before contacting the office for clarification of your results.   HEMORRHOID BANDING PROCEDURE    FOLLOW-UP CARE   The procedure you have had should have been relatively painless since the banding of the area involved does not have nerve endings and there is no pain sensation.  The rubber band cuts off the blood supply to the hemorrhoid and the band may fall off as soon as 48 hours after the banding (the band may occasionally be seen in the toilet bowl following a bowel movement). You may notice a temporary feeling of  fullness in the rectum which should respond adequately to plain Tylenol or Motrin.  Following the banding, avoid strenuous exercise that evening and resume full activity the next day.  A sitz bath (soaking in a warm tub) or bidet is soothing, and can be useful for cleansing the area after bowel movements.     To avoid constipation, take two tablespoons of natural wheat bran, natural oat bran, flax, Benefiber or any over the counter fiber supplement and increase your water intake to 7-8 glasses daily.    Unless you have been prescribed anorectal medication, do not put anything inside your rectum for two weeks: No suppositories, enemas, fingers, etc.  Occasionally, you may have more bleeding than usual after the banding procedure.  This is often from the untreated hemorrhoids rather than the treated one.  Don't be concerned if there is a tablespoon or so of blood.  If there is more blood than this, lie flat with your bottom higher than your head and apply an ice pack to the area. If the bleeding does not stop within a half an hour or if you feel faint, call our office at [phone] or go to the emergency room.  Problems are not common; however, if there is a substantial amount of bleeding, severe pain, chills, fever or difficulty passing urine (very rare) or other problems, you should call us at [phone] or report to the nearest emergency room.  Do not stay seated continuously for more than 2-3 hours for a day or two after the procedure.  Tighten your buttock muscles 10-15 times every  two hours and take 10-15 deep breaths every 1-2 hours.  Do not spend more than a few minutes on the toilet if you cannot empty your bowel; instead re-visit the toilet at a later time.    Thank you for choosing me and [facility].  Vito Cirigliano, D.O.

## 2022-05-09 ENCOUNTER — Encounter: Payer: Self-pay | Admitting: Family Medicine

## 2022-05-09 ENCOUNTER — Ambulatory Visit (INDEPENDENT_AMBULATORY_CARE_PROVIDER_SITE_OTHER): Payer: Medicare Other | Admitting: Nurse Practitioner

## 2022-05-09 VITALS — BP 124/62 | HR 63 | Temp 98.6°F | Ht 66.0 in | Wt 180.0 lb

## 2022-05-09 DIAGNOSIS — R251 Tremor, unspecified: Secondary | ICD-10-CM | POA: Diagnosis not present

## 2022-05-09 DIAGNOSIS — H2513 Age-related nuclear cataract, bilateral: Secondary | ICD-10-CM | POA: Insufficient documentation

## 2022-05-09 DIAGNOSIS — H40013 Open angle with borderline findings, low risk, bilateral: Secondary | ICD-10-CM | POA: Insufficient documentation

## 2022-05-09 DIAGNOSIS — Z461 Encounter for fitting and adjustment of hearing aid: Secondary | ICD-10-CM | POA: Insufficient documentation

## 2022-05-09 DIAGNOSIS — J432 Centrilobular emphysema: Secondary | ICD-10-CM | POA: Diagnosis not present

## 2022-05-09 LAB — BASIC METABOLIC PANEL
BUN: 17 mg/dL (ref 6–23)
CO2: 28 mEq/L (ref 19–32)
Calcium: 9.3 mg/dL (ref 8.4–10.5)
Chloride: 105 mEq/L (ref 96–112)
Creatinine, Ser: 0.85 mg/dL (ref 0.40–1.50)
GFR: 89.59 mL/min (ref >60.00)
Glucose, Bld: 101 mg/dL — ABNORMAL HIGH (ref 70–99)
Potassium: 4.2 mEq/L (ref 3.5–5.1)
Sodium: 141 mEq/L (ref 135–145)

## 2022-05-09 MED ORDER — ALBUTEROL SULFATE HFA 108 (90 BASE) MCG/ACT IN AERS: 2.0000 | INHALATION_SPRAY | Freq: Four times a day (QID) | RESPIRATORY_TRACT | 0 refills | Status: AC | PRN

## 2022-05-09 NOTE — Assessment & Plan Note (Signed)
Chronic, per patient report seems to be getting worse.  CAT score significant at 19/40 today.  Will prescribe albuterol inhaler for patient to take as needed as well as preferred pulmonology for further assistance with management.  Also discussed general health maintenance including recommendations for flu, RSV, and pneumonia vaccinations today.  Patient is no longer smoking cigarettes.  He does occasionally smoke marijuana, we did discuss that this may be playing a role in worsening lung function as well.  He reports understanding.

## 2022-05-09 NOTE — Assessment & Plan Note (Signed)
He does have tremor to his right hand, referred to neurology for assistance with evaluation.

## 2022-05-09 NOTE — Progress Notes (Signed)
   Established Patient Office Visit  Subjective   Patient ID: Grant [name], male    DOB: 07-16-1955  Age: 67 y.o. MRN: [id number]  Chief Complaint  Patient presents with   COPD    Patient has history of emphysema reports that his respiratory symptoms seem to have been progressively worse recently.  Would like evaluation with pulmonologist.  CAT score today 19/40, patient not currently on any inhaler for COPD treatment also not oxygen dependent.  Reports increased shortness of breath with activity, also reports that he has been producing more sputum lately mostly in the morning.  Denies any exacerbations over the last year.  Also reports that he has a tremor to his right hand.  Occurs both at rest and with movement.  He is concerned about Parkinson's.    ROS: see HPI    Objective:     BP 124/62   Pulse 63   Temp 98.6 F (37 C) (Temporal)   Ht 5' 6"$  (1.676 m)   Wt 180 lb (81.6 kg)   SpO2 96%   BMI 29.05 kg/m    Physical Exam Vitals reviewed.  Constitutional:      Appearance: Normal appearance.  HENT:     Head: Normocephalic and atraumatic.  Cardiovascular:     Rate and Rhythm: Normal rate and regular rhythm.  Pulmonary:     Effort: Pulmonary effort is normal.     Breath sounds: Normal breath sounds.  Musculoskeletal:     Cervical back: Neck supple.  Skin:    General: Skin is warm and dry.  Neurological:     Mental Status: He is alert and oriented to person, place, and time.     Motor: Tremor (right hand) present.  Psychiatric:        Mood and Affect: Mood normal.        Behavior: Behavior normal.        Thought Content: Thought content normal.        Judgment: Judgment normal.      No results found for any visits on 05/09/22.    The ASCVD Risk score (Arnett DK, et al., 2019) failed to calculate for the following reasons:   The valid total cholesterol range is 130 to 320 mg/dL    Assessment & Plan:   Problem List Items Addressed This Visit        Respiratory   Centrilobular emphysema ([place]) - Primary    Chronic, per patient report seems to be getting worse.  CAT score significant at 19/40 today.  Will prescribe albuterol inhaler for patient to take as needed as well as preferred pulmonology for further assistance with management.  Also discussed general health maintenance including recommendations for flu, RSV, and pneumonia vaccinations today.  Patient is no longer smoking cigarettes.  He does occasionally smoke marijuana, we did discuss that this may be playing a role in worsening lung function as well.  He reports understanding.      Relevant Medications   albuterol (VENTOLIN HFA) 108 (90 Base) MCG/ACT inhaler   Other Relevant Orders   Ambulatory referral to Pulmonology     Other   Tremor    He does have tremor to his right hand, referred to neurology for assistance with evaluation.      Relevant Orders   Ambulatory referral to Neurology   Basic metabolic panel    Return in about 6 months (around 11/07/2022) for f/u with Aryn Kops.    Ailene Ards, NP

## 2022-05-21 ENCOUNTER — Encounter: Payer: Self-pay | Admitting: Pulmonary Disease

## 2022-05-21 ENCOUNTER — Ambulatory Visit (INDEPENDENT_AMBULATORY_CARE_PROVIDER_SITE_OTHER): Payer: Medicare Other | Admitting: Pulmonary Disease

## 2022-05-21 VITALS — BP 118/64 | HR 53 | Ht 66.0 in | Wt 179.4 lb

## 2022-05-21 DIAGNOSIS — R0609 Other forms of dyspnea: Secondary | ICD-10-CM | POA: Diagnosis not present

## 2022-05-21 MED ORDER — STIOLTO RESPIMAT 2.5-2.5 MCG/ACT IN AERS: 2.0000 | INHALATION_SPRAY | Freq: Every day | RESPIRATORY_TRACT | 3 refills | Status: DC

## 2022-05-21 MED ORDER — STIOLTO RESPIMAT 2.5-2.5 MCG/ACT IN AERS
2.0000 | INHALATION_SPRAY | Freq: Every day | RESPIRATORY_TRACT | 11 refills | Status: DC
Start: 2022-05-21 — End: 2022-05-21

## 2022-05-21 NOTE — Patient Instructions (Signed)
Nice to meet you  Use Stiolto 2 puffs once a day, every day, in the mornings  If it is too expensive, please send me a message and let me know.  I recommend using it for 2 to 4 weeks, if no improvement, please send me a message and let me know.  You can continue use your albuterol rescue inhaler 2 puffs as needed for shortness of breath or wheeze  I ordered pulmonary function test to further evaluate your shortness of breath and arrived at the correct diagnosis  Return to clinic in 3 months or sooner as needed with Dr. Silas Flood

## 2022-05-21 NOTE — Progress Notes (Signed)
$'@Patient'r$  ID: [name], male    DOB: 1956/02/16, 67 y.o.   MRN: [id number]  Chief Complaint  Patient presents with   Consult    Pt is here for consult for poss COPD. Pt states he does get SOB at times and he does have an albuterol inhaler and has used it a few times.     Referring provider: Ailene Ards, NP  HPI:   67 y.o. man whom we are seeing for evaluation of emphysema.  Most recent PCP 2 x 2 reviewed.  Most recent GI note reviewed.  Patient notes dyspnea on exertion onset about 2 to 3 years ago.  Relatively mild.  With multiple flights of stairs or climb up and down ladders.  Works as Clinical biochemist.  Some days are better than others.  Not to the point he needs to stop and rest but certainly notices heavier breathing than he did before.  No time of day when things are better or worse.  No position makes these better worse.  No seasonal or environmental factors he can identify to make things better or worse.  Recently prescribed albuterol.  Uses not very often, not sure how much it helps.  Most recent lung imaging, CT coronary in 2/23 reveals bronchial thickening and mild emphysematous changes, otherwise clear on my review and interpretation.  Prior CT chest lung cancer screening 06/2020 personally reviewed and interpreted as mild emphysematous changes, hyperinflation, thickened bronchioles, mild bilateral lower lobe bronchiectasis.  PMH: Hypertension, hyperlipidemia, seasonal allergies Surgical history: Reviewed, he denies any Family history: History of emphysema, CAD and personal relatives Social history: 4060-pack-year history, quit in approximately 2016, smokes marijuana, lives in [place], 20 years in the [place], works as Agricultural engineer / Pulmonary Flowsheets:   ACT:      No data to display          MMRC:     No data to display          Epworth:      No data to display          Tests:   FENO:  No results found for:  "NITRICOXIDE"  PFT:     No data to display          WALK:      No data to display          Imaging: Personally reviewed and as per EMR and discussion in this note No results found.  Lab Results: Personally reviewed CBC    Component Value Date/Time   WBC 6.0 06/07/2021 0934   RBC 4.64 06/07/2021 0934   HGB 14.3 06/07/2021 0934   HCT 42.3 06/07/2021 0934   PLT 253.0 06/07/2021 0934   MCV 91.3 06/07/2021 0934   MCV 91.0 11/16/2013 1002   MCH 30.3 06/16/2020 1236   MCHC 33.8 06/07/2021 0934   RDW 13.7 06/07/2021 0934   LYMPHSABS 1,699 06/16/2020 1236   MONOABS 0.7 11/08/2016 2128   EOSABS 252 06/16/2020 1236   BASOSABS 50 06/16/2020 1236    BMET    Component Value Date/Time   NA 141 05/09/2022 0900   NA 142 07/03/2021 1535   K 4.2 05/09/2022 0900   CL 105 05/09/2022 0900   CO2 28 05/09/2022 0900   GLUCOSE 101 (H) 05/09/2022 0900   BUN 17 05/09/2022 0900   BUN 14 07/03/2021 1535   CREATININE 0.85 05/09/2022 0900   CREATININE 0.88 03/26/2021 1106   CALCIUM 9.3 05/09/2022 0900   GFRNONAA  83 10/25/2019 0817   GFRAA 96 10/25/2019 0817    BNP No results found for: "BNP"  ProBNP No results found for: "PROBNP"  Specialty Problems       Pulmonary Problems   Centrilobular emphysema (HCC)    Allergies  Allergen Reactions   Bee Venom Anaphylaxis, Swelling and Rash   Asa [Aspirin] Other (See Comments)    Gi upset   Atorvastatin Other (See Comments)    Joint pain   Duloxetine Other (See Comments)    GI upset   Zetia [Ezetimibe] Other (See Comments)    Joint pain    Immunization History  Administered Date(s) Administered   Tdap 02/04/2013    Past Medical History:  Diagnosis Date   Acid reflux    Allergy    Anemia    Anxiety    Basal cell carcinoma (BCC) of nasal tip    History of hiatal hernia    Hypertension    Insomnia    RLS (restless legs syndrome)    Sensorineural hearing loss    Von Willebrand disease ([place])    PT STATES THIS  IS MILD AND HAS NEVER HAD TO SEE HEMATOLOGIST-PT DID SAY THAT WHEN HE WAS [place] HE BLED ALOT BUT IT WAS DUE TO NOT FOLLOWING POST OP INSTRUCTIONS     Tobacco History: Social History   Tobacco Use  Smoking Status Former   Packs/day: 0.50   Years: 40.00   Total pack years: 20.00   Types: Cigarettes   Quit date: 05/10/2016   Years since quitting: 6.0  Smokeless Tobacco Never   Counseling given: Not Answered   Continue to not smoke  Outpatient Encounter Medications as of 05/21/2022  Medication Sig   albuterol (VENTOLIN HFA) 108 (90 Base) MCG/ACT inhaler Inhale 2 puffs into the lungs every 6 (six) hours as needed for wheezing or shortness of breath.   amLODipine (NORVASC) 5 MG tablet Take 1 tablet (5 mg total) by mouth daily.   diclofenac Sodium (VOLTAREN) 1 % GEL Apply 2 g topically 4 (four) times daily as needed.   EPINEPHRINE 0.3 mg/0.3 mL IJ SOAJ injection INJECT 0.3 ML (0.3 MG) INTO THE MUSCLE ONCE FOR 1 DOSE   lidocaine-prilocaine (EMLA) cream 1 gram qid prn   losartan (COZAAR) 50 MG tablet Take 1 tablet (50 mg total) by mouth daily.   pantoprazole (PROTONIX) 40 MG tablet Take 1 tablet (40 mg total) by mouth daily.   rOPINIRole (REQUIP) 0.25 MG tablet Take 1 tablet (0.25 mg total) by mouth in the morning and at bedtime.   rosuvastatin (CRESTOR) 5 MG tablet Take 1 tablet (5 mg total) by mouth daily.   Rotigotine (NEUPRO) 1 MG/24HR PT24 Place 1 patch (1 mg total) onto the skin daily at 4 PM.   Tiotropium Bromide-Olodaterol (STIOLTO RESPIMAT) 2.5-2.5 MCG/ACT AERS Inhale 2 puffs into the lungs daily.   No facility-administered encounter medications on file as of 05/21/2022.     Review of Systems  Review of Systems  No chest pain with exertion.  No orthopnea or PND.  Comprehensive review of otherwise negative. Physical Exam  BP 118/64 (BP Location: Left Arm, Patient Position: Sitting, Cuff Size: Normal)   Pulse (!) 53   Ht '5\' 6"'$  (1.676 m)   Wt 179  lb 6.4 oz (81.4 kg)   SpO2 98%   BMI 28.96 kg/m   Wt Readings from Last 5 Encounters:  05/21/22 179 lb 6.4 oz (81.4 kg)  05/09/22 180 lb (81.6  kg)  05/07/22 179 lb (81.2 kg)  03/19/22 176 lb 12.8 oz (80.2 kg)  02/25/22 175 lb 8 oz (79.6 kg)    BMI Readings from Last 5 Encounters:  05/21/22 28.96 kg/m  05/09/22 29.05 kg/m  05/07/22 28.89 kg/m  03/19/22 28.54 kg/m  02/25/22 28.33 kg/m     Physical Exam General: Sitting in chair, no acute distress Eyes: EOMI, no icterus Neck: Supple no JVP Pulmonary clear, normal work of breathing Cardiovascular: Warm, no edema Abdomen: Nondistended bowel sounds present MSK: No synovitis, no joint effusion Neuro: Normal gait, no Psych: Normal mood, full affect  Assessment & Plan:   Dyspnea on exertion: Suspect related to smoking-related lung disease, emphysema, thickened bronchioles/signs of chronic bronchitis as well as clinical chronic bronchitis, daily cough.  Has had CTA coronary but no echocardiogram.  PFTs for further evaluation.  Stiolto prescribed today, continue albuterol as needed.  In-depth inhaler education provided by myself and RN.  Tobacco abuse in remission: Had lung cancer CT 06/2020, none since, will discuss at next visit reenrollment for lung cancer screening program.  Quit in 2018 and has 40-60-pack-year smoking history.   Return in about 3 months (around 08/21/2022).   Lanier Clam, MD 05/21/2022   This appointment required 60 minutes of patient care (this includes precharting, chart review, review of results, face-to-face care, etc.).

## 2022-05-21 NOTE — Telephone Encounter (Signed)
New script sent to [facility] for 90 day. Called [facility] and was on hold for more than 25 minutes. Msg sent to pt.

## 2022-05-23 ENCOUNTER — Ambulatory Visit (INDEPENDENT_AMBULATORY_CARE_PROVIDER_SITE_OTHER): Payer: Medicare Other | Admitting: Pulmonary Disease

## 2022-05-23 DIAGNOSIS — R0609 Other forms of dyspnea: Secondary | ICD-10-CM

## 2022-05-23 LAB — PULMONARY FUNCTION TEST
DL/VA % pred: 99 %
DL/VA: 4.13 ml/min/mmHg/L
DLCO cor % pred: 128 %
DLCO cor: 29.69 ml/min/mmHg
DLCO unc % pred: 128 %
DLCO unc: 29.69 ml/min/mmHg
FEF 25-75 Post: 4.15 L/sec
FEF 25-75 Pre: 4.2 L/sec
FEF2575-%Change-Post: -1 %
FEF2575-%Pred-Post: 187 %
FEF2575-%Pred-Pre: 189 %
FEV1-%Change-Post: 0 %
FEV1-%Pred-Post: 148 %
FEV1-%Pred-Pre: 148 %
FEV1-Post: 4.22 L
FEV1-Pre: 4.2 L
FEV1FVC-%Change-Post: 1 %
FEV1FVC-%Pred-Pre: 107 %
FEV6-%Change-Post: 0 %
FEV6-%Pred-Post: 144 %
FEV6-%Pred-Pre: 144 %
FEV6-Post: 5.23 L
FEV6-Pre: 5.24 L
FEV6FVC-%Change-Post: 1 %
FEV6FVC-%Pred-Post: 106 %
FEV6FVC-%Pred-Pre: 105 %
FVC-%Change-Post: -1 %
FVC-%Pred-Post: 136 %
FVC-%Pred-Pre: 137 %
FVC-Post: 5.23 L
FVC-Pre: 5.29 L
Post FEV1/FVC ratio: 81 %
Post FEV6/FVC ratio: 100 %
Pre FEV1/FVC ratio: 79 %
Pre FEV6/FVC Ratio: 99 %
RV % pred: 142 %
RV: 3.12 L
TLC % pred: 137 %
TLC: 8.57 L

## 2022-05-23 NOTE — Progress Notes (Signed)
Full PFT Performed Today  

## 2022-05-23 NOTE — Patient Instructions (Signed)
Full PFT Performed Today  

## 2022-05-29 ENCOUNTER — Encounter: Payer: Self-pay | Admitting: Pulmonary Disease

## 2022-05-29 ENCOUNTER — Encounter: Payer: Self-pay | Admitting: Nurse Practitioner

## 2022-05-30 ENCOUNTER — Encounter: Payer: Self-pay | Admitting: Nurse Practitioner

## 2022-05-30 NOTE — Telephone Encounter (Signed)
New message sent by pt:  [facility] (supporting Lanier Clam, MD)13 hours ago (9:42 PM)    Okay maybe I shouldn't read to much on the Internet. So I have hyperinflated lungs. But COPD is the most common cause of hyperinflated lungs. So is it worse than COPD?   Thanks Shanon Brow      Dr. Silas Flood, please advise.

## 2022-05-31 ENCOUNTER — Other Ambulatory Visit: Payer: Self-pay

## 2022-05-31 DIAGNOSIS — R251 Tremor, unspecified: Secondary | ICD-10-CM

## 2022-06-03 ENCOUNTER — Telehealth: Payer: Self-pay | Admitting: Gastroenterology

## 2022-06-03 ENCOUNTER — Encounter: Payer: Self-pay | Admitting: Pulmonary Disease

## 2022-06-03 ENCOUNTER — Other Ambulatory Visit: Payer: Self-pay

## 2022-06-03 MED ORDER — PANTOPRAZOLE SODIUM 40 MG PO TBEC: 40.0000 mg | DELAYED_RELEASE_TABLET | Freq: Every day | ORAL | 1 refills | Status: DC

## 2022-06-03 NOTE — Telephone Encounter (Signed)
Patient sent a MyChart message requesting a refill for his acid reflux medication.  Please call patient and advise.  Thank you.

## 2022-06-03 NOTE — Telephone Encounter (Signed)
Called patient back. Patient requested to take Protonix 40 MG twice daily. Stated he has been taking once daily but his reflux symptoms are not getting better. Per Dr. Bryan Lemma Protonix 40 mg Twice daily [facility]. Patient is aware.

## 2022-06-04 ENCOUNTER — Encounter: Payer: Self-pay | Admitting: Neurology

## 2022-06-04 ENCOUNTER — Encounter: Payer: Self-pay | Admitting: Gastroenterology

## 2022-06-04 ENCOUNTER — Encounter: Payer: Self-pay | Admitting: Family Medicine

## 2022-06-05 MED ORDER — PANTOPRAZOLE SODIUM 40 MG PO TBEC: 40.0000 mg | DELAYED_RELEASE_TABLET | Freq: Two times a day (BID) | ORAL | 1 refills | Status: DC

## 2022-06-17 NOTE — Progress Notes (Unsigned)
Assessment/Plan:   ***2.  Sleep apnea  -Had sleep study in 2019 and 2022.  The initial 1 showed no sleep apnea, and the second 1 showed very mild sleep apnea with an AHI of 7.  He was started on CPAP in March, 2023.  3.  Restless leg  -Patient has been on a number of different medications.  He was on a very large dose of ropinirole, presumably with augmentation.  -Patient currently on rotigotine and ropinirole.  Told patient that he really needs to stick with 1 of these, and I would prefer the rotigotine patch, 1 mg daily.  He uses Flonase on the skin to prevent the rash.  4.  Peripheral neuropathy  -Patient has had skin biopsy, consistent with small fiber neuropathy  -Has been on gabapentin, Lyrica, Cymbalta  Subjective:   Grant [name] was seen today in the movement disorders clinic for neurologic consultation at the request of Elenore Paddy, NP.  The consultation is for the evaluation of tremor.  The patient has been cared for since 2019 by [facility] neurology.  Notes are reviewed.  He started seeing Dr. Frances Furbish in 2018 for restless leg and periodic limb movement disorder.  Sleep study was ordered which was negative for sleep apnea.  He was started on ropinirole, which was titrated up over time fairly significantly.  In 2021, the patient reported that he was taking 13 tablets of ropinirole 0.25 mg (for a total of 3.25 mg daily).  He was then changed to Requip XR, 2 mg twice daily.  Gabapentin was added to this, but he did not tolerate it well.  At the end of 2021, he changed to rotigotine.  Another sleep study was ordered at the end of 2022 which apparently showed some mild sleep apnea.  AHI was apparently 7.  He was started on CPAP in March, 2023.  He continued on rotigotine, 1 mg as well as ropinirole 0.25 mg as needed.  Patient saw Dr. Frances Furbish in May, 2023 and complained about paresthesias in the feet.  Skin biopsy was obtained by Dr. Terrace Arabia in June, 2023.  She also started patient on  Cymbalta.  Tremor: {yes no:314532}   How long has it been going on? ***  At rest or with activation?  ***  When is it noted the most?  ***  Fam hx of tremor?  {yes MG:500370}  Located where?  ***  Affected by caffeine:  {yes no:314532}  Affected by alcohol:  {yes no:314532}  Affected by stress:  {yes no:314532}  Affected by fatigue:  {yes no:314532}  Spills soup if on spoon:  {yes no:314532}  Spills glass of liquid if full:  {yes no:314532}  Affects ADL's (tying shoes, brushing teeth, etc):  {yes no:314532}  Tremor inducing meds:  {yes no:314532}  Other Specific Symptoms:  Voice: *** Sleep: ***  Vivid Dreams:  {yes no:314532}  Acting out dreams:  {yes no:314532} Wet Pillows: {yes no:314532} Postural symptoms:  {yes no:314532}  Falls?  {yes no:314532} Bradykinesia symptoms: {parkinson brady:18041} Loss of smell:  {yes no:314532} Loss of taste:  {yes no:314532} Urinary Incontinence:  {yes no:314532} Difficulty Swallowing:  {yes no:314532} Handwriting, micrographia: {yes no:314532} Trouble with ADL's:  {yes no:314532}  Trouble buttoning clothing: {yes no:314532} Depression:  {yes no:314532} Memory changes:  {yes no:314532} Hallucinations:  {yes no:314532}  visual distortions: {yes no:314532} N/V:  {yes no:314532} Lightheaded:  {yes no:314532}  Syncope: {yes no:314532} Diplopia:  {yes no:314532} Dyskinesia:  {yes no:314532}  Neuroimaging of the brain  has *** previously been performed.  It *** available for my review today.  PREVIOUS MEDICATIONS: {Parkinson's RX:18200} gabapentin, Lyrica, ropinirole (tried for restless leg and got to fairly high dose for this with what sounds like augmentation)  ALLERGIES:   Allergies  Allergen Reactions   Bee Venom Anaphylaxis, Swelling and Rash   Asa [Aspirin] Other (See Comments)    Gi upset   Atorvastatin Other (See Comments)    Joint pain   Duloxetine Other (See Comments)    GI upset   Zetia [Ezetimibe] Other (See Comments)     Joint pain    CURRENT MEDICATIONS:  Current Outpatient Medications  Medication Instructions   albuterol (VENTOLIN HFA) 108 (90 Base) MCG/ACT inhaler 2 puffs, Inhalation, Every 6 hours PRN   amLODipine (NORVASC) 5 mg, Oral, Daily   diclofenac Sodium (VOLTAREN) 2 g, Topical, 4 times daily PRN   EPINEPHRINE 0.3 mg/0.3 mL IJ SOAJ injection INJECT 0.3 ML (0.3 MG) INTO THE MUSCLE ONCE FOR 1 DOSE   lidocaine-prilocaine (EMLA) cream 1 gram qid prn   losartan (COZAAR) 50 mg, Oral, Daily   Neupro 1 mg, Transdermal, Daily-1600   pantoprazole (PROTONIX) 40 mg, Oral, 2 times daily   rOPINIRole (REQUIP) 0.25 mg, Oral, 2 times daily   rosuvastatin (CRESTOR) 5 mg, Oral, Daily   Tiotropium Bromide-Olodaterol (STIOLTO RESPIMAT) 2.5-2.5 MCG/ACT AERS 2 puffs, Inhalation, Daily    Objective:   PHYSICAL EXAMINATION:    VITALS:  There were no vitals filed for this visit.  GEN:  The patient appears stated age and is in NAD. HEENT:  Normocephalic, atraumatic.  The mucous membranes are moist. The superficial temporal arteries are without ropiness or tenderness. CV:  RRR Lungs:  CTAB Neck/HEME:  There are no carotid bruits bilaterally.  Neurological examination:  Orientation: The patient is alert and oriented x3.  Cranial nerves: There is good facial symmetry.  Extraocular muscles are intact. The visual fields are full to confrontational testing. The speech is fluent and clear. Soft palate rises symmetrically and there is no tongue deviation. Hearing is intact to conversational tone. Sensation: Sensation is intact to light touch throughout (facial, trunk, extremities). Vibration is intact at the bilateral big toe. There is no extinction with double simultaneous stimulation.  Motor: Strength is 5/5 in the bilateral upper and lower extremities.   Shoulder shrug is equal and symmetric.  There is no pronator drift. Deep tendon reflexes: Deep tendon reflexes are 2/4 at the bilateral biceps, triceps,  brachioradialis, patella and achilles. Plantar responses are downgoing bilaterally.  Movement examination: Tone: There is ***tone in the bilateral upper extremities.  The tone in the lower extremities is ***.  Abnormal movements: *** Coordination:  There is *** decremation with RAM's, *** Gait and Station: The patient has *** difficulty arising out of a deep-seated chair without the use of the hands. The patient's stride length is ***.  The patient has a *** pull test.     I have reviewed and interpreted the following labs independently   Chemistry      Component Value Date/Time   NA 141 05/09/2022 0900   NA 142 07/03/2021 1535   K 4.2 05/09/2022 0900   CL 105 05/09/2022 0900   CO2 28 05/09/2022 0900   BUN 17 05/09/2022 0900   BUN 14 07/03/2021 1535   CREATININE 0.85 05/09/2022 0900   CREATININE 0.88 03/26/2021 1106      Component Value Date/Time   CALCIUM 9.3 05/09/2022 0900   ALKPHOS 91 12/20/2021 0835  AST 19 12/20/2021 0835   ALT 24 12/20/2021 0835   BILITOT 0.4 12/20/2021 0835   BILITOT 0.4 07/03/2021 1535      Lab Results  Component Value Date   TSH 1.58 03/26/2021   Lab Results  Component Value Date   WBC 6.0 06/07/2021   HGB 14.3 06/07/2021   HCT 42.3 06/07/2021   MCV 91.3 06/07/2021   PLT 253.0 06/07/2021      Total time spent on today's visit was ***greater than 60 minutes, including both face-to-face time and nonface-to-face time.  Time included that spent on review of records (prior notes available to me/labs/imaging if pertinent), discussing treatment and goals, answering patient's questions and coordinating care.  Cc:  Elenore PaddyGray, Sarah E, NP

## 2022-06-18 ENCOUNTER — Ambulatory Visit (INDEPENDENT_AMBULATORY_CARE_PROVIDER_SITE_OTHER): Payer: Medicare Other | Admitting: Neurology

## 2022-06-18 ENCOUNTER — Encounter: Payer: Self-pay | Admitting: Neurology

## 2022-06-18 VITALS — BP 126/76 | HR 62 | Wt 181.6 lb

## 2022-06-18 DIAGNOSIS — G4733 Obstructive sleep apnea (adult) (pediatric): Secondary | ICD-10-CM

## 2022-06-18 DIAGNOSIS — G2581 Restless legs syndrome: Secondary | ICD-10-CM

## 2022-06-18 DIAGNOSIS — R251 Tremor, unspecified: Secondary | ICD-10-CM

## 2022-06-18 NOTE — Patient Instructions (Signed)
It was good to see you today!  You will need to follow with neurology at least 1 time per year, sooner should things worsen.  You can follow here or with [facility].    I would recommend you continue rotigitine patch and get off of the ropinirole.    The physicians and staff at [facility] are committed to providing excellent care. You may receive a survey requesting feedback about your experience at our office. We strive to receive "very good" responses to the survey questions. If you feel that your experience would prevent you from giving the office a "very good " response, please contact our office to try to remedy the situation. We may be reached at [phone]. Thank you for taking the time out of your busy day to complete the survey.

## 2022-06-21 ENCOUNTER — Ambulatory Visit: Payer: Medicare Other | Admitting: Nurse Practitioner

## 2022-07-18 ENCOUNTER — Ambulatory Visit: Payer: Medicare Other | Admitting: Neurology

## 2022-07-18 ENCOUNTER — Telehealth: Payer: Self-pay

## 2022-07-18 NOTE — Telephone Encounter (Signed)
Fax to [facility] rejecting refill request for Duloxetine 60mg , medication was discontinued due to side effects.

## 2022-08-11 ENCOUNTER — Other Ambulatory Visit: Payer: Self-pay | Admitting: Neurology

## 2022-08-12 NOTE — Telephone Encounter (Signed)
I'm seeing  as an allergy:  Routing to work in provider to determine if refill is needed

## 2022-08-13 ENCOUNTER — Other Ambulatory Visit: Payer: Self-pay | Admitting: *Deleted

## 2022-08-13 DIAGNOSIS — G2581 Restless legs syndrome: Secondary | ICD-10-CM

## 2022-08-13 DIAGNOSIS — G4761 Periodic limb movement disorder: Secondary | ICD-10-CM

## 2022-08-13 MED ORDER — NEUPRO 1 MG/24HR TD PT24: 1.0000 mg | MEDICATED_PATCH | Freq: Every day | TRANSDERMAL | 1 refills | Status: DC

## 2022-08-13 NOTE — Telephone Encounter (Signed)
Returned call to pt who stated that he did infact see Dr. Arbutus Leas but if he had to choose he would rather stay with Dr. Terrace Arabia and yes he still uses the diclofenac gel.

## 2022-08-13 NOTE — Telephone Encounter (Signed)
Pt last seen on 02/25/22 per note "We will continue Neupro patch, ropinirole 0.25mg  twice daily and diclofenac and lidocaine cream as needed. "  Follow up scheduled on 02/25/23

## 2022-08-18 ENCOUNTER — Encounter: Payer: Self-pay | Admitting: Nurse Practitioner

## 2022-08-21 ENCOUNTER — Ambulatory Visit (INDEPENDENT_AMBULATORY_CARE_PROVIDER_SITE_OTHER): Payer: Medicare Other | Admitting: Pulmonary Disease

## 2022-08-21 ENCOUNTER — Encounter: Payer: Self-pay | Admitting: Pulmonary Disease

## 2022-08-21 VITALS — BP 112/60 | HR 58 | Temp 98.1°F | Ht 66.0 in | Wt 182.0 lb

## 2022-08-21 DIAGNOSIS — Z122 Encounter for screening for malignant neoplasm of respiratory organs: Secondary | ICD-10-CM | POA: Diagnosis not present

## 2022-08-21 DIAGNOSIS — F17211 Nicotine dependence, cigarettes, in remission: Secondary | ICD-10-CM

## 2022-08-21 NOTE — Progress Notes (Signed)
@Patient  ID: Grant [name], male    DOB: [birth year], 67 y.o.   MRN: [id number]  Chief Complaint  Patient presents with   Follow-up    Breathing is some better since the last visit. He has not had to use his albuterol.     Referring provider: Elenore Paddy, NP  HPI:   67 y.o. man whom we are seeing for evaluation of emphysema and dyspnea on exertion.  Most recent neurology note reviewed.  Overall doing well.  Prescribe Stiolto at last visit.  Has helped his breathing.  Can do a bit more.  Less short of breath.  Pleased with the results.  PFTs performed in interim reveal hyperinflation and air trapping without fixed obstruction, normal DLCO.  HPI initial visit: Patient notes dyspnea on exertion onset about 2 to 3 years ago.  Relatively mild.  With multiple flights of stairs or climb up and down ladders.  Works as Personnel officer.  Some days are better than others.  Not to the point he needs to stop and rest but certainly notices heavier breathing than he did before.  No time of day when things are better or worse.  No position makes these better worse.  No seasonal or environmental factors he can identify to make things better or worse.  Recently prescribed albuterol.  Uses not very often, not sure how much it helps.  Most recent lung imaging, CT coronary in 2/23 reveals bronchial thickening and mild emphysematous changes, otherwise clear on my review and interpretation.  Prior CT chest lung cancer screening 06/2020 personally reviewed and interpreted as mild emphysematous changes, hyperinflation, thickened bronchioles, mild bilateral lower lobe bronchiectasis.  PMH: Hypertension, hyperlipidemia, seasonal allergies Surgical history: Reviewed, he denies any Family history: History of emphysema, CAD and personal relatives Social history: 4060-pack-year history, quit in approximately 2016, smokes marijuana, lives in [place], 20 years in the [place], works as Market researcher /  Pulmonary Flowsheets:   ACT:      No data to display           MMRC:     No data to display           Epworth:      No data to display           Tests:   FENO:  No results found for: "NITRICOXIDE"  PFT:    Latest Ref Rng & Units 05/23/2022    8:06 AM  PFT Results  FVC-Pre L 5.29   FVC-Predicted Pre % 137   FVC-Post L 5.23   FVC-Predicted Post % 136   Pre FEV1/FVC % % 79   Post FEV1/FCV % % 81   FEV1-Pre L 4.20   FEV1-Predicted Pre % 148   FEV1-Post L 4.22   DLCO uncorrected ml/min/mmHg 29.69   DLCO UNC% % 128   DLCO corrected ml/min/mmHg 29.69   DLCO COR %Predicted % 128   DLVA Predicted % 99   TLC L 8.57   TLC % Predicted % 137   RV % Predicted % 142   Personally reviewed, interpreted as normal spirometry, no bronchodilator response, lung biopsy consistent with hyperinflation and air trapping, DLCO within normal limits  WALK:      No data to display           Imaging: Personally reviewed and as per EMR and discussion in this note No results found.  Lab Results: Personally reviewed CBC    Component Value Date/Time   WBC 6.0  06/07/2021 0934   RBC 4.64 06/07/2021 0934   HGB 14.3 06/07/2021 0934   HCT 42.3 06/07/2021 0934   PLT 253.0 06/07/2021 0934   MCV 91.3 06/07/2021 0934   MCV 91.0 11/16/2013 1002   MCH 30.3 06/16/2020 1236   MCHC 33.8 06/07/2021 0934   RDW 13.7 06/07/2021 0934   LYMPHSABS 1,699 06/16/2020 1236   MONOABS 0.7 11/08/2016 2128   EOSABS 252 06/16/2020 1236   BASOSABS 50 06/16/2020 1236    BMET    Component Value Date/Time   NA 141 05/09/2022 0900   NA 142 07/03/2021 1535   K 4.2 05/09/2022 0900   CL 105 05/09/2022 0900   CO2 28 05/09/2022 0900   GLUCOSE 101 (H) 05/09/2022 0900   BUN 17 05/09/2022 0900   BUN 14 07/03/2021 1535   CREATININE 0.85 05/09/2022 0900   CREATININE 0.88 03/26/2021 1106   CALCIUM 9.3 05/09/2022 0900   GFRNONAA 83 10/25/2019 0817   GFRAA 96 10/25/2019 0817    BNP No  results found for: "BNP"  ProBNP No results found for: "PROBNP"  Specialty Problems       Pulmonary Problems   Centrilobular emphysema (HCC)    Allergies  Allergen Reactions   Bee Venom Anaphylaxis, Swelling and Rash   Asa [Aspirin] Other (See Comments)    Gi upset   Atorvastatin Other (See Comments)    Joint pain   Duloxetine Other (See Comments)    GI upset   Zetia [Ezetimibe] Other (See Comments)    Joint pain    Immunization History  Administered Date(s) Administered   Tdap 02/04/2013    Past Medical History:  Diagnosis Date   Acid reflux    Allergy    Anemia    Anxiety    Basal cell carcinoma (BCC) of nasal tip    History of hiatal hernia    Hypertension    Insomnia    RLS (restless legs syndrome)    Sensorineural hearing loss    Von Willebrand disease (HCC)    PT STATES THIS IS MILD AND HAS NEVER HAD TO SEE HEMATOLOGIST-PT DID SAY THAT WHEN HE WAS CIRCUMC[place] IN [place] [employer] YE[place] AGO HE BLED ALOT BUT IT WAS DUE TO NOT FOLLOWING POST OP INSTRUCTIONS     Tobacco History: Social History   Tobacco Use  Smoking Status Former   Packs/day: 0.50   Years: 40.00   Additional pack years: 0.00   Total pack years: 20.00   Types: Cigarettes   Quit date: 05/10/2016   Years since quitting: 6.2  Smokeless Tobacco Never   Counseling given: Not Answered   Continue to not smoke  Outpatient Encounter Medications as of 08/21/2022  Medication Sig   albuterol (VENTOLIN HFA) 108 (90 Base) MCG/ACT inhaler Inhale 2 puffs into the lungs every 6 (six) hours as needed for wheezing or shortness of breath.   amLODipine (NORVASC) 5 MG tablet Take 1 tablet (5 mg total) by mouth daily.   diclofenac Sodium (VOLTAREN) 1 % GEL APPLY 2 GM TOPICALLY FOUR TIMES A DAY AS NEEDED   EPINEPHRINE 0.3 mg/0.3 mL IJ SOAJ injection INJECT 0.3 ML (0.3 MG) INTO THE MUSCLE ONCE FOR 1 DOSE   lidocaine-prilocaine (EMLA) cream 1 gram qid prn   losartan (COZAAR) 50 MG tablet Take 1 tablet (50 mg total)  by mouth daily.   pantoprazole (PROTONIX) 40 MG tablet Take 1 tablet (40 mg total) by mouth 2 (two) times daily.   rosuvastatin (CRESTOR) 5 MG tablet Take 1  tablet (5 mg total) by mouth daily.   Rotigotine (NEUPRO) 1 MG/24HR PT24 Place 1 patch (1 mg total) onto the skin daily at 4 PM.   Tiotropium Bromide-Olodaterol (STIOLTO RESPIMAT) 2.5-2.5 MCG/ACT AERS Inhale 2 puffs into the lungs daily.   [DISCONTINUED] rOPINIRole (REQUIP) 0.25 MG tablet Take 1 tablet (0.25 mg total) by mouth in the morning and at bedtime.   No facility-administered encounter medications on file as of 08/21/2022.     Review of Systems  Review of Systems  N/a Physical Exam  BP 112/60 (BP Location: Left Arm, Cuff Size: Normal)   Pulse (!) 58   Temp 98.1 F (36.7 C) (Oral)   Ht 5\' 6"  (1.676 m)   Wt 182 lb (82.6 kg)   SpO2 98% Comment: on RA  BMI 29.38 kg/m   Wt Readings from Last 5 Encounters:  08/21/22 182 lb (82.6 kg)  06/18/22 181 lb 9.6 oz (82.4 kg)  05/21/22 179 lb 6.4 oz (81.4 kg)  05/09/22 180 lb (81.6 kg)  05/07/22 179 lb (81.2 kg)    BMI Readings from Last 5 Encounters:  08/21/22 29.38 kg/m  06/18/22 29.31 kg/m  05/21/22 28.96 kg/m  05/09/22 29.05 kg/m  05/07/22 28.89 kg/m     Physical Exam General: Sitting in chair, no acute distress Eyes: EOMI, no icterus Neck: Supple no JVP Pulmonary clear, normal work of breathing Cardiovascular: Warm, no edema Abdomen: Nondistended bowel sounds present MSK: No synovitis, no joint effusion Neuro: Normal gait, no Psych: Normal mood, full affect  Assessment & Plan:   Dyspnea on exertion: Suspect related to smoking-related lung disease, emphysema, thickened bronchioles/signs of chronic bronchitis as well as clinical chronic bronchitis, daily cough.  PFTs reveal air trapping and hyperinflation without decrease in DLCO, no fixed obstruction.  Symptoms improved with Stiolto.  To continue.  Tobacco abuse in remission: Quit in 2018.  20-pack-year  history.  Had lung cancer CT 06/2020 recommended 1 year follow-up, none since.  Using a screening age, should have screening.com, patient is a 4.2% risk of developing lung cancer in the next 6 years.  Reviewed risk and benefits of lung cancer screening.  After shared decision-making he agrees to reenroll in lung cancer screening.  Low-dose CT scan ordered today.   Return in about 6 months (around 02/20/2023).   Karren Burly, MD 08/21/2022  I spent 41 minutes in the care of the patient including review of records, face-to-face visit, coordination of care.

## 2022-08-21 NOTE — Patient Instructions (Addendum)
I am glad the Stiolto is helping  No changes to medications  I ordered a CT scan for lung cancer screening  Return to clinic in 6 months or sooner as needed

## 2022-08-22 ENCOUNTER — Ambulatory Visit (INDEPENDENT_AMBULATORY_CARE_PROVIDER_SITE_OTHER): Payer: Medicare Other | Admitting: Nurse Practitioner

## 2022-08-22 ENCOUNTER — Ambulatory Visit (INDEPENDENT_AMBULATORY_CARE_PROVIDER_SITE_OTHER): Payer: Medicare Other

## 2022-08-22 VITALS — BP 108/62 | HR 53 | Temp 98.3°F | Ht 66.0 in | Wt 180.2 lb

## 2022-08-22 DIAGNOSIS — R21 Rash and other nonspecific skin eruption: Secondary | ICD-10-CM

## 2022-08-22 DIAGNOSIS — M79645 Pain in left finger(s): Secondary | ICD-10-CM | POA: Diagnosis not present

## 2022-08-22 DIAGNOSIS — M65311 Trigger thumb, right thumb: Secondary | ICD-10-CM | POA: Diagnosis not present

## 2022-08-22 DIAGNOSIS — M79644 Pain in right finger(s): Secondary | ICD-10-CM

## 2022-08-22 DIAGNOSIS — M79646 Pain in unspecified finger(s): Secondary | ICD-10-CM

## 2022-08-22 MED ORDER — TRIAMCINOLONE ACETONIDE 0.1 % EX CREA: 1.0000 | TOPICAL_CREAM | Freq: Two times a day (BID) | CUTANEOUS | 2 refills | Status: AC

## 2022-08-22 NOTE — Assessment & Plan Note (Signed)
Intermittent, etiology unclear Offered referral to dermatology but he reports he sees Dr. Melida Quitter at [facility].  Encouraged to discuss this further with dermatologist. In the meantime treat with as needed triamcinolone cream for flares for up to 14 consecutive days twice a day.  As well as daily use of Eucerin eczema cream for prevention.

## 2022-08-22 NOTE — Assessment & Plan Note (Signed)
Referral to hand surgery made today.

## 2022-08-22 NOTE — Assessment & Plan Note (Signed)
Subacute Suspect arthritis of carpometacarpal thumb joint.  However will collect x-ray of bilateral thumbs for further evaluation. For now treat with over-the-counter analgesics such as ibuprofen and/or Tylenol.

## 2022-08-22 NOTE — Patient Instructions (Addendum)
Eucerin eczema cream twice a day  Paper tape  8am: ibuprofen 200-400mg  12ppm: tylenol 500mg  4pm: ibuprofen 200-400mg  8pm: tylenol 500  Do not exceed 3000mg  of tylenol in 24 hours Do not exceed 3200mg  of ibuprofen in 24 hours

## 2022-08-22 NOTE — Progress Notes (Signed)
Established Patient Office Visit  Subjective   Patient ID: Grant [name], male    DOB: [birth year]  Age: 67 y.o. MRN: [id number]  Chief Complaint  Patient presents with   Pain    Having pain in both thumps, comes in consistently with certain movement and with out movement. Been going on for about three weeks and has progress worse       Patient arrives today for acute visit.   Thumb pain: Has intermittent bilateral thumb pain over the last 3 weeks.  2-8/10 in intensity described as sharp and deep.  Worsens with any ROM of the thumb. Has taken tylenol and used voltaren gel on the area with some improvement in the pain.  Reports history of frequent repetitive fine motor motions at his job.  Trigger finger: Reports that his right thumb gets stuck at times and he believes this is due to trigger finger.  Has had this in the past requiring surgical treatment.  Would like referral back to hand surgeon.  Rash/pruritus: Occurs intermittently on flexor surface of bilateral arms.  Causes itching.  Denies any lesions or visible vesicles, however with itching can cause some minor skin breakdown and scabs.    ROS: see HPI    Objective:     BP 108/62   Pulse (!) 53   Temp 98.3 F (36.8 C) (Temporal)   Ht 5\' 6"  (1.676 m)   Wt 180 lb 4 oz (81.8 kg)   SpO2 96%   BMI 29.09 kg/m    Physical Exam Vitals reviewed.  Constitutional:      Appearance: Normal appearance.  HENT:     Head: Normocephalic and atraumatic.  Cardiovascular:     Rate and Rhythm: Normal rate and regular rhythm.  Pulmonary:     Effort: Pulmonary effort is normal.     Breath sounds: Normal breath sounds.  Musculoskeletal:     Right hand: Tenderness present.     Left hand: Tenderness present.     Cervical back: Neck supple.     Comments: Tenderness located on palmer surface of bilateral hands near the carpometacarpal joint.   Skin:    General: Skin is warm and dry.     Comments: Bilateral upper arms: has multiple  pinpoint scabbed areas, no underlying redness or tenderness.   Neurological:     Mental Status: He is alert and oriented to person, place, and time.  Psychiatric:        Mood and Affect: Mood normal.        Behavior: Behavior normal.        Thought Content: Thought content normal.        Judgment: Judgment normal.      No results found for any visits on 08/22/22.    The ASCVD Risk score (Arnett DK, et al., 2019) failed to calculate for the following reasons:   The valid total cholesterol range is 130 to 320 mg/dL    Assessment & Plan:   Problem List Items Addressed This Visit       Musculoskeletal and Integument   Rash    Intermittent, etiology unclear Offered referral to dermatology but he reports he sees Dr. Melida Quitter at [facility].  Encouraged to discuss this further with dermatologist. In the meantime treat with as needed triamcinolone cream for flares for up to 14 consecutive days twice a day.  As well as daily use of Eucerin eczema cream for prevention.      Relevant Medications   triamcinolone cream (  KENALOG) 0.1 %   Trigger finger of right thumb - Primary    Referral to hand surgery made today.      Relevant Orders   Ambulatory referral to Hand Surgery     Other   Thumb pain    Subacute Suspect arthritis of carpometacarpal thumb joint.  However will collect x-ray of bilateral thumbs for further evaluation. For now treat with over-the-counter analgesics such as ibuprofen and/or Tylenol.      Relevant Orders   DG Finger Thumb Left   DG Finger Thumb Right    Return for as scheduled.  Total time spent on encounter today was 33 minutes including face-to-face evaluation, review of previous records, and development/discussion of treatment plan.   Elenore Paddy, NP

## 2022-08-26 ENCOUNTER — Other Ambulatory Visit: Payer: Self-pay | Admitting: Nurse Practitioner

## 2022-08-26 DIAGNOSIS — I10 Essential (primary) hypertension: Secondary | ICD-10-CM

## 2022-08-28 ENCOUNTER — Ambulatory Visit (INDEPENDENT_AMBULATORY_CARE_PROVIDER_SITE_OTHER): Payer: Medicare Other | Admitting: Orthopaedic Surgery

## 2022-08-28 DIAGNOSIS — M79645 Pain in left finger(s): Secondary | ICD-10-CM | POA: Diagnosis not present

## 2022-08-28 DIAGNOSIS — M79644 Pain in right finger(s): Secondary | ICD-10-CM | POA: Diagnosis not present

## 2022-08-28 MED ORDER — CELECOXIB 200 MG PO CAPS: 200.0000 mg | ORAL_CAPSULE | Freq: Two times a day (BID) | ORAL | 3 refills | Status: DC

## 2022-08-28 NOTE — Progress Notes (Signed)
Office Visit Note   Patient: Grant [name]           Date of Birth: Nov 27, 1955           MRN: [id number] Visit Date: 08/28/2022              Requested by: Elenore Paddy, NP [address] [place],  [place] [address] PCP: Elenore Paddy, NP   Assessment & Plan: Visit Diagnoses:  1. Bilateral thumb pain     Plan: Impression 67 year old gentleman with bilateral thumb pain likely etiology MCP and IP joint osteoarthritis without significant degenerative changes.  Disease process explained and treatment options were reviewed.  We will try Celebrex as well as diclofenac gel.  Activity modification as needed.  Follow-up as needed.  Follow-Up Instructions: No follow-ups on file.   Orders:  No orders of the defined types were placed in this encounter.  No orders of the defined types were placed in this encounter.     Procedures: No procedures performed   Clinical Data: No additional findings.   Subjective: Chief Complaint  Patient presents with   Right Hand - Pain    Trigger thumb    HPI patient is a pleasant 67 year old right-hand-dominant gentleman who comes in today with bilateral thumb pain as well as triggering to the right thumb.  Symptoms have been ongoing for years which have progressively worsened.  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  His pain is mainly concentrated around the IP and MCP joint that is worse with use of the hand.  Review of Systems  Constitutional: Negative.   HENT: Negative.    Eyes: Negative.   Respiratory: Negative.    Cardiovascular: Negative.   Gastrointestinal: Negative.   Endocrine: Negative.   Genitourinary: Negative.   Skin: Negative.   Allergic/Immunologic: Negative.   Neurological: Negative.   Hematological: Negative.   Psychiatric/Behavioral: Negative.    All other systems reviewed and are negative.    Objective: Vital Signs: There were no vitals taken for this visit.  Physical  Exam Vitals and nursing note reviewed.  Constitutional:      Appearance: He is well-developed.  HENT:     Head: Normocephalic and atraumatic.  Eyes:     Pupils: Pupils are equal, round, and reactive to light.  Pulmonary:     Effort: Pulmonary effort is normal.  Abdominal:     Palpations: Abdomen is soft.  Musculoskeletal:        General: Normal range of motion.     Cervical back: Neck supple.  Skin:    General: Skin is warm.  Neurological:     Mental Status: He is alert and oriented to person, place, and time.  Psychiatric:        Behavior: Behavior normal.        Thought Content: Thought content normal.        Judgment: Judgment normal.     Ortho Exam Examination of bilateral thumbs show pain at the IP and MCP joints with grind test.  Slight tenderness along the flexor tendon sheath without any overt locking. Specialty Comments:  No specialty comments available.  Imaging: No results found.   PMFS History: Patient Active Problem List   Diagnosis Date Noted   Trigger finger of right thumb 08/22/2022   Thumb pain 08/22/2022   Open angle with borderline findings, low risk, bilateral 05/09/2022   Encounter for fitting and adjustment of hearing aid 05/09/2022  Age-related nuclear cataract, bilateral 05/09/2022   Tremor 05/09/2022   High alkaline phosphatase 12/20/2021   Numbness and tingling of both feet 08/14/2021   Rash 06/21/2021   Neuropathy 06/07/2021   Pain in both feet 06/07/2021   Short-term memory loss 06/07/2021   Hypertension 03/26/2021   Palpitations 03/26/2021   Von Willebrand disease (HCC) 08/31/2020   Aortic atherosclerosis (HCC) 06/27/2020   Centrilobular emphysema (HCC) 06/27/2020   Lateral epicondylitis, left elbow 05/31/2020   Recurrent inguinal hernia of right side without obstruction or gangrene    Bilateral inguinal hernia 05/08/2018   Cubital tunnel syndrome on left 04/09/2018   Sensorineural hearing loss    Restless leg syndrome  11/27/2017   Flat feet, bilateral 04/03/2017   Insomnia    Past Medical History:  Diagnosis Date   Acid reflux    Allergy    Anemia    Anxiety    Basal cell carcinoma (BCC) of nasal tip    History of hiatal hernia    Hypertension    Insomnia    RLS (restless legs syndrome)    Sensorineural hearing loss    Von Willebrand disease (HCC)    PT STATES THIS IS MILD AND HAS NEVER HAD TO SEE HEMATOLOGIST-PT DID SAY THAT WHEN HE WAS CIRCUMCIS[place] [employer] YE[place] AGO HE BLED ALOT BUT IT WAS DUE TO NOT FOLLOWING POST OP INSTRUCTIONS     Family History  Problem Relation Age of Onset   Hypertension Mother    Hyperlipidemia Mother    Hypertension Father    Hyperlipidemia Father    Stroke Father        x2   Colon cancer Neg Hx    Esophageal cancer Neg Hx    Rectal cancer Neg Hx    Stomach cancer Neg Hx    Sleep apnea Neg Hx    Neuropathy Neg Hx    Pancreatic cancer Neg Hx     Past Surgical History:  Procedure Laterality Date   CIRCUMCISION     AS AN ADULT   COLONOSCOPY     FOOT SURGERY Right    arch support   HERNIA REPAIR     INCISION AND DRAINAGE Right 02/04/2013   Procedure: INCISION AND DRAINAGE;  Surgeon: Sharma Covert, MD;  Location: [facility] OR;  Service: Orthopedics;  Laterality: Right;   INGUINAL HERNIA REPAIR Bilateral 05/13/2018   Procedure: LAPAROSCOPIC BILATERAL INGUINAL HERNIA REPAIR;  Surgeon: Henrene Dodge, MD;  Location: [facility] ORS;  Service: General;  Laterality: Bilateral;   INGUINAL HERNIA REPAIR Right 06/17/2019   Procedure: HERNIA REPAIR INGUINAL ADULT WITH MESH-- Recurrent;  Surgeon: Henrene Dodge, MD;  Location: [facility] ORS;  Service: General;  Laterality: Right;   OPEN REDUCTION INTERNAL FIXATION (ORIF) FINGER WITH RADIAL BONE GRAFT Right 02/04/2013   Procedure: OPEN REDUCTION INTERNAL FIXATION (ORIF) right ring and small fingers;  Surgeon: Sharma Covert, MD;  Location: [facility] OR;  Service: Orthopedics;  Laterality: Right;   Social History   Occupational History    Occupation: Copywriter, advertising    Occupation: retired    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

## 2022-09-02 ENCOUNTER — Encounter: Payer: Self-pay | Admitting: Orthopaedic Surgery

## 2022-09-04 ENCOUNTER — Encounter: Payer: Self-pay | Admitting: Orthopaedic Surgery

## 2022-09-05 ENCOUNTER — Other Ambulatory Visit: Payer: Self-pay | Admitting: Nurse Practitioner

## 2022-09-05 ENCOUNTER — Other Ambulatory Visit: Payer: Self-pay | Admitting: Physician Assistant

## 2022-09-05 DIAGNOSIS — I10 Essential (primary) hypertension: Secondary | ICD-10-CM

## 2022-09-05 MED ORDER — AMLODIPINE BESYLATE 5 MG PO TABS
5.0000 mg | ORAL_TABLET | Freq: Every day | ORAL | 0 refills | Status: DC
Start: 2022-09-05 — End: 2023-01-30

## 2022-09-05 MED ORDER — CELECOXIB 200 MG PO CAPS: 200.0000 mg | ORAL_CAPSULE | Freq: Two times a day (BID) | ORAL | 3 refills | Status: DC

## 2022-09-05 NOTE — Telephone Encounter (Signed)
sent 

## 2022-09-10 ENCOUNTER — Other Ambulatory Visit: Payer: Self-pay | Admitting: Nurse Practitioner

## 2022-09-12 ENCOUNTER — Other Ambulatory Visit: Payer: Self-pay | Admitting: Nurse Practitioner

## 2022-09-12 DIAGNOSIS — I10 Essential (primary) hypertension: Secondary | ICD-10-CM

## 2022-09-13 ENCOUNTER — Other Ambulatory Visit: Payer: Medicare Other

## 2022-09-27 ENCOUNTER — Ambulatory Visit
Admission: RE | Admit: 2022-09-27 | Discharge: 2022-09-27 | Disposition: A | Discharge status: Home / Self Care | Payer: Medicare Other | Source: Ambulatory Visit | Attending: Pulmonary Disease | Admitting: Pulmonary Disease

## 2022-09-27 DIAGNOSIS — Z122 Encounter for screening for malignant neoplasm of respiratory organs: Secondary | ICD-10-CM

## 2022-09-27 DIAGNOSIS — F17211 Nicotine dependence, cigarettes, in remission: Secondary | ICD-10-CM

## 2022-10-06 ENCOUNTER — Encounter: Payer: Self-pay | Admitting: Cardiovascular Disease

## 2022-10-09 ENCOUNTER — Encounter (INDEPENDENT_AMBULATORY_CARE_PROVIDER_SITE_OTHER): Payer: Self-pay

## 2022-10-09 NOTE — Progress Notes (Signed)
CT scan with small nodule - recommend 1 year follow up CT scan. Good news!

## 2022-11-03 ENCOUNTER — Encounter: Payer: Self-pay | Admitting: Gastroenterology

## 2022-11-05 ENCOUNTER — Encounter: Payer: Self-pay | Admitting: Orthopaedic Surgery

## 2022-11-07 ENCOUNTER — Ambulatory Visit (INDEPENDENT_AMBULATORY_CARE_PROVIDER_SITE_OTHER): Payer: Medicare Other | Admitting: Nurse Practitioner

## 2022-11-07 VITALS — BP 134/80 | HR 60 | Temp 98.6°F | Ht 66.0 in | Wt 179.5 lb

## 2022-11-07 DIAGNOSIS — E785 Hyperlipidemia, unspecified: Secondary | ICD-10-CM

## 2022-11-07 DIAGNOSIS — M189 Osteoarthritis of first carpometacarpal joint, unspecified: Secondary | ICD-10-CM | POA: Insufficient documentation

## 2022-11-07 DIAGNOSIS — I10 Essential (primary) hypertension: Secondary | ICD-10-CM

## 2022-11-07 DIAGNOSIS — M180 Bilateral primary osteoarthritis of first carpometacarpal joints: Secondary | ICD-10-CM

## 2022-11-07 DIAGNOSIS — I700 Atherosclerosis of aorta: Secondary | ICD-10-CM

## 2022-11-07 LAB — LIPID PANEL
Cholesterol: 103 mg/dL (ref 0–200)
HDL: 43.1 mg/dL (ref >39.00)
LDL Cholesterol: 51 mg/dL (ref 0–99)
NonHDL: 59.42
Total CHOL/HDL Ratio: 2
Triglycerides: 43 mg/dL (ref 0.0–149.0)
VLDL: 8.6 mg/dL (ref 0.0–40.0)

## 2022-11-07 LAB — COMPREHENSIVE METABOLIC PANEL
ALT: 33 U/L (ref 0–53)
AST: 26 U/L (ref 0–37)
Albumin: 4.3 g/dL (ref 3.5–5.2)
Alkaline Phosphatase: 83 U/L (ref 39–117)
BUN: 17 mg/dL (ref 6–23)
CO2: 26 mEq/L (ref 19–32)
Calcium: 9.4 mg/dL (ref 8.4–10.5)
Chloride: 105 mEq/L (ref 96–112)
Creatinine, Ser: 0.9 mg/dL (ref 0.40–1.50)
GFR: 87.75 mL/min (ref >60.00)
Glucose, Bld: 100 mg/dL — ABNORMAL HIGH (ref 70–99)
Potassium: 4.3 mEq/L (ref 3.5–5.1)
Sodium: 138 mEq/L (ref 135–145)
Total Bilirubin: 0.5 mg/dL (ref 0.2–1.2)
Total Protein: 6.9 g/dL (ref 6.0–8.3)

## 2022-11-07 MED ORDER — CELECOXIB 200 MG PO CAPS: 200.0000 mg | ORAL_CAPSULE | Freq: Two times a day (BID) | ORAL | Status: DC

## 2022-11-07 NOTE — Assessment & Plan Note (Signed)
Chronic Check lipid panel today (patient is fasting).  If LDL is high, reach out to Dr. Rennis Golden regarding possibly increasing rosuvastatin 10 mg daily.  Otherwise, consider referral back to Dr. Rennis Golden.

## 2022-11-07 NOTE — Assessment & Plan Note (Signed)
Chronic, pain continues to be intermittent. He was encouraged to follow back up with orthopedics to discuss alternative treatment options. Patient reports he may trial Celebrex again in the future before seeing them.  I think this is reasonable.

## 2022-11-07 NOTE — Progress Notes (Signed)
Established Patient Office Visit  Subjective   Patient ID: Grant [name], male    DOB: 1955-11-23  Age: 67 y.o. MRN: [id number]  Chief Complaint  Patient presents with   Hyperlipidemia    Htn/HLD: Continues on amlodipine 5 mg daily and losartan 50 mg daily.  Tolerating well.  On rosuvastatin 5 mg daily tolerating well.  Allergic to Zetia.  Last LDL collected about 1 year ago was 55 Bilateral first carpometacarpal and metacarpophalangeal OA of the hands: Continues to have intermittent pain.  Trialed Celebrex, but this resulted in GI upset so he has stopped this.  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  Will apply Voltaren gel as needed.    ROS: see HPI    Objective:     BP 134/80   Pulse 60   Temp 98.6 F (37 C) (Temporal)   Ht 5\' 6"  (1.676 m)   Wt 179 lb 8 oz (81.4 kg)   SpO2 95%   BMI 28.97 kg/m  BP Readings from Last 3 Encounters:  11/07/22 134/80  08/22/22 108/62  08/21/22 112/60   Wt Readings from Last 3 Encounters:  11/07/22 179 lb 8 oz (81.4 kg)  08/22/22 180 lb 4 oz (81.8 kg)  08/21/22 182 lb (82.6 kg)      Physical Exam Vitals reviewed.  Constitutional:      Appearance: Normal appearance.  HENT:     Head: Normocephalic and atraumatic.  Cardiovascular:     Rate and Rhythm: Normal rate and regular rhythm.  Pulmonary:     Effort: Pulmonary effort is normal.     Breath sounds: Normal breath sounds.  Musculoskeletal:     Cervical back: Neck supple.  Skin:    General: Skin is warm and dry.  Neurological:     Mental Status: He is alert and oriented to person, place, and time.  Psychiatric:        Mood and Affect: Mood normal.        Behavior: Behavior normal.        Thought Content: Thought content normal.        Judgment: Judgment normal.      No results found for any visits on 11/07/22.    The ASCVD Risk score (Arnett DK, et al., 2019) failed to calculate for the following reasons:   The  valid total cholesterol range is 130 to 320 mg/dL    Assessment & Plan:   Problem List Items Addressed This Visit       Cardiovascular and Mediastinum   Aortic atherosclerosis (HCC)    Chronic Check lipid panel today (patient is fasting).  If LDL is high, reach out to Dr. Rennis Golden regarding possibly increasing rosuvastatin 10 mg daily.  Otherwise, consider referral back to Dr. Rennis Golden.      Hypertension    Chronic, stable Continue amlodipine 5 mg daily and losartan 50 mg daily        Musculoskeletal and Integument   Osteoarthritis of CMC joint of thumb    Chronic, pain continues to be intermittent. He was encouraged to follow back up with orthopedics to discuss alternative treatment options. Patient reports he may trial Celebrex again in the future before seeing them.  I think this is reasonable.      Relevant Medications   celecoxib (CELEBREX) 200 MG capsule   Other Visit Diagnoses     Hyperlipidemia, unspecified hyperlipidemia type    -  Primary  Relevant Orders   Lipid panel   Comprehensive metabolic panel       Return in about 3 months (around 02/07/2023) for 3-6 months F/U with Maralyn Sago.    Elenore Paddy, NP

## 2022-11-07 NOTE — Assessment & Plan Note (Signed)
Chronic, stable Continue amlodipine 5 mg daily and losartan 50 mg daily

## 2022-11-14 ENCOUNTER — Encounter: Payer: Self-pay | Admitting: Family Medicine

## 2022-11-14 ENCOUNTER — Telehealth: Payer: Self-pay

## 2022-11-14 NOTE — Telephone Encounter (Signed)
Called pt and assured him that his order was sent to the wrong DME and that he was double charged from both [facility] and [facility]. Told pt that [facility] and [facility], as well as our office are working to right this wrong and are trying to get him a refund from [facility]. Told pt that his last CPAP Supply order was sent to [facility] in error and he hasn't been changed to a different DME, and that he is still with [facility]. Told pt we will reach out to him once we hear something back. Pt verbalized understanding.

## 2022-11-19 ENCOUNTER — Encounter: Payer: Self-pay | Admitting: Pulmonary Disease

## 2022-11-24 ENCOUNTER — Encounter: Payer: Self-pay | Admitting: Nurse Practitioner

## 2022-11-27 ENCOUNTER — Other Ambulatory Visit: Payer: Self-pay | Admitting: Gastroenterology

## 2022-11-28 NOTE — Telephone Encounter (Signed)
Patient added to [employer] work queue if there are any cancellations.  Patient has upcoming ROV for 01/20/23 (rescheduled from 11/6 due to provider out of office)   Patient has been informed he is placed on wait list. [phone] Best contact for patient if anything further is needed.

## 2022-12-02 ENCOUNTER — Ambulatory Visit (INDEPENDENT_AMBULATORY_CARE_PROVIDER_SITE_OTHER): Payer: Medicare Other | Admitting: Pulmonary Disease

## 2022-12-02 ENCOUNTER — Encounter: Payer: Self-pay | Admitting: Family Medicine

## 2022-12-02 ENCOUNTER — Encounter: Payer: Self-pay | Admitting: Pulmonary Disease

## 2022-12-02 VITALS — BP 124/68 | HR 55 | Ht 66.0 in | Wt 180.4 lb

## 2022-12-02 DIAGNOSIS — J329 Chronic sinusitis, unspecified: Secondary | ICD-10-CM | POA: Diagnosis not present

## 2022-12-02 DIAGNOSIS — J432 Centrilobular emphysema: Secondary | ICD-10-CM | POA: Diagnosis not present

## 2022-12-02 DIAGNOSIS — J310 Chronic rhinitis: Secondary | ICD-10-CM

## 2022-12-02 MED ORDER — FLUTICASONE PROPIONATE 50 MCG/ACT NA SUSP: 1.0000 | Freq: Every day | NASAL | 5 refills | Status: DC

## 2022-12-02 NOTE — Progress Notes (Signed)
@Patient  ID: Grant [name], male    DOB: 12-23-1955, 67 y.o.   MRN: [id number]  Chief Complaint  Patient presents with   Follow-up    Pt is here to discuss documentation for [facility].    Referring provider: Elenore Paddy, NP  HPI:   67 y.o. man whom we are seeing for evaluation of emphysema and dyspnea on exertion.  Most recent PCP was note reviewed.  Overall doing well.  Continues to feel the Stiolto helps his breathing.  Still gets some short winded but Stiolto has been beneficial.  He reports issues with sinus congestion, runny nose.  Present for many years.  Make it difficult to use CPAP at times.  Seems to wax and wane throughout the year but triggered by seasonal changes, dusty environments at work etc.  Has tried antihistamines in the past but causes drowsiness, makes him feel bad, adverse events have led to him not using any longer.  HPI initial visit: Patient notes dyspnea on exertion onset about 2 to 3 years ago.  Relatively mild.  With multiple flights of stairs or climb up and down ladders.  Works as Personnel officer.  Some days are better than others.  Not to the point he needs to stop and rest but certainly notices heavier breathing than he did before.  No time of day when things are better or worse.  No position makes these better worse.  No seasonal or environmental factors he can identify to make things better or worse.  Recently prescribed albuterol.  Uses not very often, not sure how much it helps.  Most recent lung imaging, CT coronary in 2/23 reveals bronchial thickening and mild emphysematous changes, otherwise clear on my review and interpretation.  Prior CT chest lung cancer screening 06/2020 personally reviewed and interpreted as mild emphysematous changes, hyperinflation, thickened bronchioles, mild bilateral lower lobe bronchiectasis.  PMH: Hypertension, hyperlipidemia, seasonal allergies Surgical history: Reviewed, he denies any Family history: History of emphysema, CAD and  personal relatives Social history: 4060-pack-year history, quit in approximately 2016, smokes marijuana, lives in [place], 20 years in the [employer], works as Market researcher / Pulmonary Flowsheets:   ACT:      No data to display          MMRC:     No data to display          Epworth:      No data to display          Tests:   FENO:  No results found for: "NITRICOXIDE"  PFT:    Latest Ref Rng & Units 05/23/2022    8:06 AM  PFT Results  FVC-Pre L 5.29   FVC-Predicted Pre % 137   FVC-Post L 5.23   FVC-Predicted Post % 136   Pre FEV1/FVC % % 79   Post FEV1/FCV % % 81   FEV1-Pre L 4.20   FEV1-Predicted Pre % 148   FEV1-Post L 4.22   DLCO uncorrected ml/min/mmHg 29.69   DLCO UNC% % 128   DLCO corrected ml/min/mmHg 29.69   DLCO COR %Predicted % 128   DLVA Predicted % 99   TLC L 8.57   TLC % Predicted % 137   RV % Predicted % 142   Personally reviewed, interpreted as normal spirometry, no bronchodilator response, lung biopsy consistent with hyperinflation and air trapping, DLCO within normal limits  WALK:      No data to display  Imaging: Personally reviewed and as per EMR and discussion in this note No results found.  Lab Results: Personally reviewed CBC    Component Value Date/Time   WBC 6.0 06/07/2021 0934   RBC 4.64 06/07/2021 0934   HGB 14.3 06/07/2021 0934   HCT 42.3 06/07/2021 0934   PLT 253.0 06/07/2021 0934   MCV 91.3 06/07/2021 0934   MCV 91.0 11/16/2013 1002   MCH 30.3 06/16/2020 1236   MCHC 33.8 06/07/2021 0934   RDW 13.7 06/07/2021 0934   LYMPHSABS 1,699 06/16/2020 1236   MONOABS 0.7 11/08/2016 2128   EOSABS 252 06/16/2020 1236   BASOSABS 50 06/16/2020 1236    BMET    Component Value Date/Time   NA 138 11/07/2022 0837   NA 142 07/03/2021 1535   K 4.3 11/07/2022 0837   CL 105 11/07/2022 0837   CO2 26 11/07/2022 0837   GLUCOSE 100 (H) 11/07/2022 0837   BUN 17 11/07/2022 0837   BUN 14 07/03/2021  1535   CREATININE 0.90 11/07/2022 0837   CREATININE 0.88 03/26/2021 1106   CALCIUM 9.4 11/07/2022 0837   GFRNONAA 83 10/25/2019 0817   GFRAA 96 10/25/2019 0817    BNP No results found for: "BNP"  ProBNP No results found for: "PROBNP"  Specialty Problems       Pulmonary Problems   Centrilobular emphysema (HCC)    Allergies  Allergen Reactions   Bee Venom Anaphylaxis, Swelling and Rash   Asa [Aspirin] Other (See Comments)    Gi upset   Atorvastatin Other (See Comments)    Joint pain   Duloxetine Other (See Comments)    GI upset   Zetia [Ezetimibe] Other (See Comments)    Joint pain    Immunization History  Administered Date(s) Administered   Tdap 02/04/2013    Past Medical History:  Diagnosis Date   Acid reflux    Allergy    Anemia    Anxiety    Basal cell carcinoma (BCC) of nasal tip    History of hiatal hernia    Hypertension    Insomnia    RLS (restless legs syndrome)    Sensorineural hearing loss    Von Willebrand disease (HCC)    PT STATES THIS IS MILD AND HAS NEVER HAD TO SEE HEMATOLOGIST-PT DID SAY THAT WHEN HE WAS CIRCUMCISED IN THE [employer] YE[place] AGO HE BLED ALOT BUT IT WAS DUE TO NOT FOLLOWING POST OP INSTRUCTIONS     Tobacco History: Social History   Tobacco Use  Smoking Status Former   Current packs/day: 0.00   Average packs/day: 0.5 packs/day for 40.0 years (20.0 ttl pk-yrs)   Types: Cigarettes   Start date: 05/10/1976   Quit date: 05/10/2016   Years since quitting: 6.5  Smokeless Tobacco Never   Counseling given: Not Answered   Continue to not smoke  Outpatient Encounter Medications as of 12/02/2022  Medication Sig   albuterol (VENTOLIN HFA) 108 (90 Base) MCG/ACT inhaler Inhale 2 puffs into the lungs every 6 (six) hours as needed for wheezing or shortness of breath.   amLODipine (NORVASC) 5 MG tablet Take 1 tablet (5 mg total) by mouth daily. Follow-up appt due in Aug w/labs   celecoxib (CELEBREX) 200 MG capsule Take 1 capsule (200 mg  total) by mouth 2 (two) times daily.   diclofenac Sodium (VOLTAREN) 1 % GEL APPLY 2 GM TOPICALLY FOUR TIMES A DAY AS NEEDED   EPINEPHrine 0.3 mg/0.3 mL IJ SOAJ injection INJECT 0.3 ML (0.3 MG) INTO THE  MUSCLE ONCE FOR 1 DOSE   lidocaine-prilocaine (EMLA) cream 1 gram qid prn   losartan (COZAAR) 50 MG tablet TAKE 1 TABLET DAILY   pantoprazole (PROTONIX) 40 MG tablet TAKE 1 TABLET TWICE A DAY   rosuvastatin (CRESTOR) 5 MG tablet Take 1 tablet (5 mg total) by mouth daily.   Rotigotine (NEUPRO) 1 MG/24HR PT24 Place 1 patch (1 mg total) onto the skin daily at 4 PM.   Tiotropium Bromide-Olodaterol (STIOLTO RESPIMAT) 2.5-2.5 MCG/ACT AERS Inhale 2 puffs into the lungs daily.   triamcinolone cream (KENALOG) 0.1 % Apply 1 Application topically 2 (two) times daily.   No facility-administered encounter medications on file as of 12/02/2022.     Review of Systems  Review of Systems  N/a Physical Exam  BP 124/68 (BP Location: Left Arm, Cuff Size: Normal)   Pulse (!) 55   Ht 5\' 6"  (1.676 m)   Wt 180 lb 6.4 oz (81.8 kg)   SpO2 98%   BMI 29.12 kg/m   Wt Readings from Last 5 Encounters:  12/02/22 180 lb 6.4 oz (81.8 kg)  11/07/22 179 lb 8 oz (81.4 kg)  08/22/22 180 lb 4 oz (81.8 kg)  08/21/22 182 lb (82.6 kg)  06/18/22 181 lb 9.6 oz (82.4 kg)    BMI Readings from Last 5 Encounters:  12/02/22 29.12 kg/m  11/07/22 28.97 kg/m  08/22/22 29.09 kg/m  08/21/22 29.38 kg/m  06/18/22 29.31 kg/m     Physical Exam General: Sitting in chair, no acute distress Eyes: EOMI, no icterus Neck: Supple no JVP Pulmonary clear, normal work of breathing Cardiovascular: Warm, no edema Abdomen: Nondistended bowel sounds present MSK: No synovitis, no joint effusion Neuro: Normal gait, no Psych: Normal mood, full affect  Assessment & Plan:   Dyspnea on exertion: Suspect related to smoking-related lung disease, emphysema, thickened bronchioles/signs of chronic bronchitis as well as clinical chronic  bronchitis, daily cough.  PFTs reveal air trapping and hyperinflation without decrease in DLCO, no fixed obstruction.  Symptoms improved with Stiolto.  To continue.  Tobacco abuse in remission: Quit in 2018.  20-pack-year history.  Had lung cancer CT 06/2020 recommended 1 year follow-up, none since.  Using a screening age, should have [email or web address], patient is at elevated risk of developing lung cancer in the next 6 years.  Reviewed risk and benefits of lung cancer screening.  After shared decision-making he agrees to continue in lung cancer screening.  Lung RADS 2 09/2022, yearly CT scan reordered today.  Chronic sinusitis/rhinitis: Problem for many years.  Flares with change in seasons as well as environmental changes at job sites etc.  Recommend Flonase 1 spray each nostril once a day, new prescription today..  Can increase to twice a day if needed based on severity of symptoms if worsens.   Return in about 6 months (around 06/01/2023) for f/u Dr. Judeth Horn.   Karren Burly, MD 12/02/2022

## 2022-12-02 NOTE — Patient Instructions (Addendum)
Neck to see you again  Try Flonase 1 spray each nostril once a day  Can increase to twice a day in the congestion and runny nose is particularly bad related to seasonal changes or job site etc.  Return to clinic in 6 months or sooner as needed with Dr. Judeth Horn

## 2022-12-04 ENCOUNTER — Encounter: Payer: Self-pay | Admitting: Nurse Practitioner

## 2022-12-05 ENCOUNTER — Encounter: Payer: Self-pay | Admitting: Pulmonary Disease

## 2022-12-05 ENCOUNTER — Other Ambulatory Visit: Payer: Self-pay | Admitting: Nurse Practitioner

## 2022-12-05 DIAGNOSIS — G2581 Restless legs syndrome: Secondary | ICD-10-CM

## 2022-12-05 DIAGNOSIS — G4730 Sleep apnea, unspecified: Secondary | ICD-10-CM

## 2022-12-05 DIAGNOSIS — J432 Centrilobular emphysema: Secondary | ICD-10-CM

## 2022-12-06 ENCOUNTER — Other Ambulatory Visit: Payer: Self-pay

## 2022-12-06 DIAGNOSIS — G2581 Restless legs syndrome: Secondary | ICD-10-CM

## 2022-12-06 DIAGNOSIS — G4730 Sleep apnea, unspecified: Secondary | ICD-10-CM

## 2022-12-10 NOTE — Telephone Encounter (Signed)
Patient requests appt with sleep doctor for OSA. Says PCP faxed referral. Can someone help schedule a new sleep consult with Dr. Aldean Ast?

## 2022-12-13 ENCOUNTER — Encounter: Payer: Self-pay | Admitting: Nurse Practitioner

## 2022-12-13 ENCOUNTER — Ambulatory Visit (INDEPENDENT_AMBULATORY_CARE_PROVIDER_SITE_OTHER): Payer: Medicare Other | Admitting: Nurse Practitioner

## 2022-12-13 VITALS — BP 130/70 | HR 69 | Temp 98.6°F | Ht 66.0 in | Wt 182.1 lb

## 2022-12-13 DIAGNOSIS — J310 Chronic rhinitis: Secondary | ICD-10-CM

## 2022-12-13 DIAGNOSIS — R251 Tremor, unspecified: Secondary | ICD-10-CM

## 2022-12-13 DIAGNOSIS — J329 Chronic sinusitis, unspecified: Secondary | ICD-10-CM

## 2022-12-13 DIAGNOSIS — J324 Chronic pansinusitis: Secondary | ICD-10-CM | POA: Insufficient documentation

## 2022-12-13 NOTE — Assessment & Plan Note (Signed)
Neurology referral ordered today

## 2022-12-13 NOTE — Progress Notes (Signed)
This encounter was created in error - please disregard.

## 2022-12-13 NOTE — Assessment & Plan Note (Signed)
Chronic Continue flonase nasal spray  F/u with pulmonology

## 2022-12-13 NOTE — Progress Notes (Signed)
   Established Patient Office Visit  Subjective   Patient ID: Grant [name], male    DOB: Aug 03, 1955  Age: 67 y.o. MRN: [id number]  Chief Complaint  Patient presents with   Sinus Problem    Been going for a long time, get worse at night, flares randomly. Sinus issue causes him to have mucus whenever his sinus flares up    Chronic/Rhinitis: has had congestion and runny nose for years. Triggers include season change, enviornmental exposures. Doesn't tolerate antihistamines. His pulmonologist is trialing flonase nasal spray.   Tremor: Has had a tremor to right arm that is intermittent but getting progressively worse for 6-8 months. 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 Tremor can occur with or without purposeful movement. He also reports feeling not as strong as he once was.     ROS: see HPI    Objective:     BP 130/70   Pulse 69   Temp 98.6 F (37 C) (Temporal)   Ht 5\' 6"  (1.676 m)   Wt 182 lb 2 oz (82.6 kg)   SpO2 97%   BMI 29.40 kg/m    Physical Exam Vitals reviewed.  Constitutional:      Appearance: Normal appearance.  HENT:     Head: Normocephalic and atraumatic.  Cardiovascular:     Rate and Rhythm: Normal rate and regular rhythm.  Pulmonary:     Effort: Pulmonary effort is normal.     Breath sounds: Normal breath sounds.  Musculoskeletal:     Cervical back: Neck supple.  Skin:    General: Skin is warm and dry.  Neurological:     Mental Status: He is alert and oriented to person, place, and time.     Motor: Tremor present.  Psychiatric:        Mood and Affect: Mood normal.        Behavior: Behavior normal.        Thought Content: Thought content normal.        Judgment: Judgment normal.      No results found for any visits on 12/13/22.    The ASCVD Risk score (Arnett DK, et al., 2019) failed to calculate for the following reasons:   The valid total cholesterol range is 130 to 320 mg/dL    Assessment & Plan:   Problem List Items  Addressed This Visit       Respiratory   Chronic sinusitis - Primary    Chronic Continue flonase nasal spray  F/u with pulmonology       Chronic rhinitis    Chronic Continue flonase nasal spray  F/u with pulmonology         Other   Tremor    Neurology referral ordered today       Return for as scheduled.    Elenore Paddy, NP

## 2022-12-31 ENCOUNTER — Encounter: Payer: Self-pay | Admitting: Cardiovascular Disease

## 2022-12-31 ENCOUNTER — Encounter: Payer: Self-pay | Admitting: Nurse Practitioner

## 2023-01-06 ENCOUNTER — Encounter: Payer: Self-pay | Admitting: Pulmonary Disease

## 2023-01-07 ENCOUNTER — Encounter: Payer: Self-pay | Admitting: Gastroenterology

## 2023-01-10 ENCOUNTER — Encounter: Payer: Self-pay | Admitting: Nurse Practitioner

## 2023-01-15 ENCOUNTER — Ambulatory Visit: Payer: Medicare Other | Admitting: Pulmonary Disease

## 2023-01-19 ENCOUNTER — Encounter: Payer: Self-pay | Admitting: Gastroenterology

## 2023-01-20 ENCOUNTER — Ambulatory Visit: Payer: Medicare Other | Admitting: Pulmonary Disease

## 2023-01-22 ENCOUNTER — Other Ambulatory Visit: Payer: Self-pay

## 2023-01-22 DIAGNOSIS — G4761 Periodic limb movement disorder: Secondary | ICD-10-CM

## 2023-01-22 DIAGNOSIS — G2581 Restless legs syndrome: Secondary | ICD-10-CM

## 2023-01-22 MED ORDER — NEUPRO 1 MG/24HR TD PT24
1.0000 mg | MEDICATED_PATCH | Freq: Every day | TRANSDERMAL | 1 refills | Status: DC
Start: 2023-01-22 — End: 2023-07-23

## 2023-01-30 ENCOUNTER — Other Ambulatory Visit: Payer: Self-pay | Admitting: Nurse Practitioner

## 2023-01-30 ENCOUNTER — Ambulatory Visit: Payer: Medicare Other | Admitting: Nurse Practitioner

## 2023-01-30 ENCOUNTER — Encounter: Payer: Self-pay | Admitting: Nurse Practitioner

## 2023-01-30 VITALS — BP 146/70 | HR 79 | Ht 66.0 in | Wt 179.6 lb

## 2023-01-30 DIAGNOSIS — J432 Centrilobular emphysema: Secondary | ICD-10-CM

## 2023-01-30 DIAGNOSIS — J329 Chronic sinusitis, unspecified: Secondary | ICD-10-CM

## 2023-01-30 DIAGNOSIS — I10 Essential (primary) hypertension: Secondary | ICD-10-CM

## 2023-01-30 DIAGNOSIS — G4733 Obstructive sleep apnea (adult) (pediatric): Secondary | ICD-10-CM | POA: Diagnosis not present

## 2023-01-30 DIAGNOSIS — R0683 Snoring: Secondary | ICD-10-CM

## 2023-01-30 NOTE — Patient Instructions (Addendum)
Continue to use CPAP every night, minimum of 4-6 hours a night.  Change equipment as directed. Wash your tubing with warm soap and water daily, hang to dry. Wash humidifier portion weekly. Use bottled, distilled water and change daily Be aware of reduced alertness and do not drive or operate heavy machinery if experiencing this or drowsiness.  Exercise encouraged, as tolerated. Healthy weight management discussed.  Avoid or decrease alcohol consumption and medications that make you more sleepy, if possible. Notify if persistent daytime sleepiness occurs even with consistent use of PAP therapy.  We will try to adjust your CPAP settings so you are more comfortable. Bring your SD card next week so I can see what you're set on and make appropriate changes  Orders placed for an memory foam Evora Full Face mask   Given your interest in [name] and difficulties with CPAP, we will repeat your home sleep study to reassess your sleep apnea. Someone should contact you in the next 2-3 weeks to schedule this   Continue Stiolto 2 puffs daily Continue Albuterol inhaler 2 puffs every 6 hours as needed for shortness of breath or wheezing. Notify if symptoms persist despite rescue inhaler/neb use.  Continue flonase nasal spray 2 sprays each nostril daily  Follow up in 6-8 weeks with Grant Kamareon Sciandra,NP to review sleep study results. Follow up with Dr. [name] as scheduled. If symptoms do not improve or worsen, please contact office for sooner follow up or seek emergency care.

## 2023-01-30 NOTE — Assessment & Plan Note (Signed)
Continue intranasal steroid. Unable to tolerate antihistamines due to side effects. See above

## 2023-01-30 NOTE — Progress Notes (Signed)
@Patient  ID: Grant [name], male    DOB: May 05, 1955, 67 y.o.   MRN: [id number]  Chief Complaint  Patient presents with   Consult    Sleep Consult    Referring provider: Elenore Paddy, NP  HPI: 67 year old male, former smoker followed for COPD/emphysema. He is referred for sleep apnea today. He is followed by sleep apnea. Past medical history significant for HTN, chronic sinusitis, Von Willlebrand disease, insomnia, RLS, neuropathy.   TEST/EVENTS:  02/19/2021 HST: AHI 7/h, SpO2 low 81% 05/23/2022 PFT: FVC 137, FEV1 148, ratio 81, TLC 137, DLCO 128 no BD 09/27/2022 LDCT chest lung cancer screen: Atherosclerosis.  No LAD.  Tiny left upper lobe nodule, 2 mm.  No other suspicious nodules.  No acute airspace disease.  Mild diffuse bronchial wall thickening with very mild centrilobular and paraseptal emphysema.  Lung RADS 2S.  12/02/2022: OV with Dr. Judeth Horn.  Followed for emphysema and DOE.  Overall doing well.  Continues to feel the Stiolto helps with breathing.  Still gets some short windedness but inhaler has been beneficial.  Has issues with sinus congestion and runny nose.  Present for many years.  Makes it difficult to use CPAP at times.  Seems to wax and wane throughout the year but triggered by seasonal changes, dusty environments at work etc.  Has tried antihistamines in the past but causes drowsiness and makes him feel bad.  Suspect that his DOE is related to smoking-related lung disease, emphysema and thickened bronchioles with signs of bronchitis.  PFTs with air trapping and hyperinflation without decrease in DLCO and no fixed obstructions.  Continue Stiolto as he receives benefit from use.  Shared decision making to continue with low-dose lung cancer screening CTs.  Lung RADS 2 09/2022.  Yearly CT chest reordered.  Advised to try Flonase nasal spray for sinus symptoms.  01/30/2023: Today - sleep consult Patient presents today for sleep consult to establish care.  He was previously  followed by [facility] neurology and is wanting to change his sleep apnea management over to [place].  He has a history of mild sleep apnea.  He does have a CPAP at home but does not always use it. Feels better with CPAP. He does think it helps him feel more rested and have better energy levels.  His biggest problem is he feels like his sinuses do get worse with CPAP therapy so he hasn't been using it much recently.  He also has issues with indigestion and gas that seems to get worse when he uses his CPAP.  He had previously asked to have his pressures adjusted but was told that he was well-controlled and did not need this.  Without CPAP, he does feel little more tired during the day.  He has been told in the past that he snores.  Denies any issues with drowsy driving or morning headaches.  No sleep parasomnia/paralysis.  Currently wearing a nasal pillow mask has not tried a fullface mask.  He does use Flonase nasal spray for his sinus symptoms which does help.  Breathing is doing well and is at his baseline. Goes to bed between 10 PM and midnight.  Falls asleep within 10 minutes to an hour.  Some nights he does not wake up and other times he gets up 3-4 times a night.  Wakes up at 530 to 6:30 AM.  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  Weight has fluctuated 20 pounds over the  last 2 years.  Last sleep study was in 2022.  He is not sure of his current CPAP settings.  He did not bring his machine with him.  He does think he has an SD card in it at home.  He will bring this back next week.  He is also interested in inspire device if he were to be a candidate.  Wants to know if he can repeat his sleep study.  Does not use any supplemental oxygen at home.  No sleep aids. He is also treated for restless leg and neuropathy by neurology.  They currently have him on Neupro, which seems to be working well for the most part. He has a history of hypertension, controlled on antihypertensives.  No  history of stroke or diabetes. He is a former smoker, quit in 2018.  He does smoke marijuana most days.  Does not drink any alcohol.  No excessive caffeine intake.  Lives with his wife.  Works as an Personnel officer.  Family history of emphysema, allergies, asthma and heart disease.  Epworth 15  Allergies  Allergen Reactions   Bee Venom Anaphylaxis, Swelling and Rash   Asa [Aspirin] Other (See Comments)    Gi upset   Atorvastatin Other (See Comments)    Joint pain   Duloxetine Other (See Comments)    GI upset   Zetia [Ezetimibe] Other (See Comments)    Joint pain    Immunization History  Administered Date(s) Administered   Tdap 02/04/2013    Past Medical History:  Diagnosis Date   Acid reflux    Allergy    Anemia    Anxiety    Basal cell carcinoma (BCC) of nasal tip    History of hiatal hernia    Hypertension    Insomnia    RLS (restless legs syndrome)    Sensorineural hearing loss    Von Willebrand disease (HCC)    PT STATES THIS IS MILD AND HAS NEVER HAD TO SEE HEMATOLOGIST-PT DID SAY THAT WHEN HE WAS CIRCUMC[place] IN [place] YE[place] AGO HE BLED ALOT BUT IT WAS DUE TO NOT FOLLOWING POST OP INSTRUCTIONS     Tobacco History: Social History   Tobacco Use  Smoking Status Former   Current packs/day: 0.00   Average packs/day: 0.5 packs/day for 40.0 years (20.0 ttl pk-yrs)   Types: Cigarettes   Start date: 05/10/1976   Quit date: 05/10/2016   Years since quitting: 6.7  Smokeless Tobacco Never   Counseling given: Not Answered   Outpatient Medications Prior to Visit  Medication Sig Dispense Refill   albuterol (VENTOLIN HFA) 108 (90 Base) MCG/ACT inhaler Inhale 2 puffs into the lungs every 6 (six) hours as needed for wheezing or shortness of breath. 8 g 0   amLODipine (NORVASC) 5 MG tablet TAKE 1 TABLET DAILY (FOLLOW UP DUE IN AUGUST WITH LABS) 90 tablet 3   celecoxib (CELEBREX) 200 MG capsule Take 1 capsule (200 mg total) by mouth 2 (two) times daily.     diclofenac Sodium  (VOLTAREN) 1 % GEL APPLY 2 GM TOPICALLY FOUR TIMES A DAY AS NEEDED 400 g 3   EPINEPHrine 0.3 mg/0.3 mL IJ SOAJ injection INJECT 0.3 ML (0.3 MG) INTO THE MUSCLE ONCE FOR 1 DOSE 2 each 3   fluticasone (FLONASE) 50 MCG/ACT nasal spray Place 1 spray into both nostrils daily. 16 g 5   lidocaine-prilocaine (EMLA) cream 1 gram qid prn 30 g 11   losartan (COZAAR) 50 MG tablet TAKE 1 TABLET DAILY  90 tablet 3   pantoprazole (PROTONIX) 40 MG tablet TAKE 1 TABLET TWICE A DAY 180 tablet 1   rosuvastatin (CRESTOR) 5 MG tablet Take 1 tablet (5 mg total) by mouth daily. 90 tablet 3   Rotigotine (NEUPRO) 1 MG/24HR PT24 Place 1 patch (1 mg total) onto the skin daily at 4 PM. 90 patch 1   Tiotropium Bromide-Olodaterol (STIOLTO RESPIMAT) 2.5-2.5 MCG/ACT AERS Inhale 2 puffs into the lungs daily. 12 g 3   triamcinolone cream (KENALOG) 0.1 % Apply 1 Application topically 2 (two) times daily. 15 g 2   No facility-administered medications prior to visit.     Review of Systems:   Constitutional: No weight loss or gain, night sweats, fevers, chills. +fatigue HEENT: No headaches, difficulty swallowing, tooth/dental problems, or sore throat. No sneezing, itching, ear ache +nasal congestion, post nasal drip CV:  No chest pain, orthopnea, PND, swelling in lower extremities, anasarca, dizziness, palpitations, syncope Resp: +snoring, baseline shortness of breath with exertion, occasional cough (Baseline). No excess mucus or change in color of mucus. No hemoptysis. No wheezing.  No chest wall deformity GI:  +indigestion, gas. No abdominal pain, nausea, vomiting, diarrhea, change in bowel habits, loss of appetite, bloody stools.  GU: No dysuria, change in color of urine, urgency or frequency.  Skin: No rash, lesions, ulcerations MSK:  No joint pain or swelling.   Neuro: No dizziness or lightheadedness.  Psych: No depression or anxiety. Mood stable. +sleep disturbance    Physical Exam:  BP (!) 146/70 (BP Location: Right  Arm, Cuff Size: Normal)   Pulse 79   Ht 5\' 6"  (1.676 m)   Wt 179 lb 9.6 oz (81.5 kg)   SpO2 97%   BMI 28.99 kg/m   GEN: Pleasant, interactive, well-kempt; in no acute distress HEENT:  Normocephalic and atraumatic. PERRLA. Sclera white. Nasal turbinates pink, moist and patent bilaterally. No rhinorrhea present. Oropharynx pink and moist, without exudate or edema. No lesions, ulcerations, or postnasal drip. Mallampati III NECK:  Supple w/ fair ROM. No JVD present. Normal carotid impulses w/o bruits. Thyroid symmetrical with no goiter or nodules palpated. No lymphadenopathy.   CV: RRR, no m/r/g, no peripheral edema. Pulses intact, +2 bilaterally. No cyanosis, pallor or clubbing. PULMONARY:  Unlabored, regular breathing. Clear bilaterally A&P w/o wheezes/rales/rhonchi. No accessory muscle use.  GI: BS present and normoactive. Soft, non-tender to palpation. No organomegaly or masses detected.  MSK: No erythema, warmth or tenderness. Cap refil <2 sec all extrem. No deformities or joint swelling noted.  Neuro: A/Ox3. No focal deficits noted.   Skin: Warm, no lesions or rashe Psych: Normal affect and behavior. Judgement and thought content appropriate.     Lab Results:  CBC    Component Value Date/Time   WBC 6.0 06/07/2021 0934   RBC 4.64 06/07/2021 0934   HGB 14.3 06/07/2021 0934   HCT 42.3 06/07/2021 0934   PLT 253.0 06/07/2021 0934   MCV 91.3 06/07/2021 0934   MCV 91.0 11/16/2013 1002   MCH 30.3 06/16/2020 1236   MCHC 33.8 06/07/2021 0934   RDW 13.7 06/07/2021 0934   LYMPHSABS 1,699 06/16/2020 1236   MONOABS 0.7 11/08/2016 2128   EOSABS 252 06/16/2020 1236   BASOSABS 50 06/16/2020 1236    BMET    Component Value Date/Time   NA 138 11/07/2022 0837   NA 142 07/03/2021 1535   K 4.3 11/07/2022 0837   CL 105 11/07/2022 0837   CO2 26 11/07/2022 0837   GLUCOSE 100 (H) 11/07/2022  0837   BUN 17 11/07/2022 0837   BUN 14 07/03/2021 1535   CREATININE 0.90 11/07/2022 0837    CREATININE 0.88 03/26/2021 1106   CALCIUM 9.4 11/07/2022 0837   GFRNONAA 83 10/25/2019 0817   GFRAA 96 10/25/2019 0817    BNP No results found for: "BNP"   Imaging:  No results found.  Administration History     None          Latest Ref Rng & Units 05/23/2022    8:06 AM  PFT Results  FVC-Pre L 5.29   FVC-Predicted Pre % 137   FVC-Post L 5.23   FVC-Predicted Post % 136   Pre FEV1/FVC % % 79   Post FEV1/FCV % % 81   FEV1-Pre L 4.20   FEV1-Predicted Pre % 148   FEV1-Post L 4.22   DLCO uncorrected ml/min/mmHg 29.69   DLCO UNC% % 128   DLCO corrected ml/min/mmHg 29.69   DLCO COR %Predicted % 128   DLVA Predicted % 99   TLC L 8.57   TLC % Predicted % 137   RV % Predicted % 142     No results found for: "NITRICOXIDE"      Assessment & Plan:   OSA on CPAP Mild OSA on CPAP.  Suboptimal compliance due to difficulties with tolerating CPAP therapy.  The nasal mask seems to be exacerbating his chronic sinusitis symptoms.  He also feels like his pressure settings are set too high with worsening gas and indigestion in the morning.  He will bring his ST card and so we can evaluate current pressure settings and make further adjustments.  He does have a beard but also likes to wear his glasses from time to time with his CPAP mask on so he wants to make sure the new mask will allow this. Will place orders to switch him over to an [place] fullface mask.  Encouraged to work on increasing uses nightly as he does seem to receive benefit from use.  Aware of proper care/use of device.  Aware of risks of untreated sleep apnea.  Also discussed treatment options and indicated that he would not be a candidate for inspire at this time given mild severity.  He would like to reassess the status of his sleep apnea.  Home sleep study ordered for further evaluation.  Understands that he should not use his CPAP the night of the study.  Reviewed safe driving practices.  Healthy weight loss  encouraged.  Patient Instructions  Continue to use CPAP every night, minimum of 4-6 hours a night.  Change equipment as directed. Wash your tubing with warm soap and water daily, hang to dry. Wash humidifier portion weekly. Use bottled, distilled water and change daily Be aware of reduced alertness and do not drive or operate heavy machinery if experiencing this or drowsiness.  Exercise encouraged, as tolerated. Healthy weight management discussed.  Avoid or decrease alcohol consumption and medications that make you more sleepy, if possible. Notify if persistent daytime sleepiness occurs even with consistent use of PAP therapy.  We will try to adjust your CPAP settings so you are more comfortable. Bring your SD card next week so I can see what you're set on and make appropriate changes  Orders placed for an memory foam [place] Full Face mask   Given your interest in [place] and difficulties with CPAP, we will repeat your home sleep study to reassess your sleep apnea. Someone should contact you in the next 2-3 weeks to schedule  this   Continue Stiolto 2 puffs daily Continue Albuterol inhaler 2 puffs every 6 hours as needed for shortness of breath or wheezing. Notify if symptoms persist despite rescue inhaler/neb use.  Continue flonase nasal spray 2 sprays each nostril daily  Follow up in 6-8 weeks with Katie Kynleigh Artz,NP to review sleep study results. Follow up with Dr. Judeth Horn as scheduled. If symptoms do not improve or worsen, please contact office for sooner follow up or seek emergency care.    Chronic sinusitis Continue intranasal steroid. Unable to tolerate antihistamines due to side effects. See above  Centrilobular emphysema (HCC) Compensated on current regimen. Action plan in place.   Advised if symptoms do not improve or worsen, to please contact office for sooner follow up or seek emergency care.   I spent 35 minutes of dedicated to the care of this patient on the date of this  encounter to include pre-visit review of records, face-to-face time with the patient discussing conditions above, post visit ordering of testing, clinical documentation with the electronic health record, making appropriate referrals as documented, and communicating necessary findings to members of the patients care team.  Noemi Chapel, NP 01/30/2023  Pt aware and understands NP's role.

## 2023-01-30 NOTE — Assessment & Plan Note (Signed)
Mild OSA on CPAP.  Suboptimal compliance due to difficulties with tolerating CPAP therapy.  The nasal mask seems to be exacerbating his chronic sinusitis symptoms.  He also feels like his pressure settings are set too high with worsening gas and indigestion in the morning.  He will bring his ST card and so we can evaluate current pressure settings and make further adjustments.  He does have a beard but also likes to wear his glasses from time to time with his CPAP mask on so he wants to make sure the new mask will allow this. Will place orders to switch him over to an [place] fullface mask.  Encouraged to work on increasing uses nightly as he does seem to receive benefit from use.  Aware of proper care/use of device.  Aware of risks of untreated sleep apnea.  Also discussed treatment options and indicated that he would not be a candidate for inspire at this time given mild severity.  He would like to reassess the status of his sleep apnea.  Home sleep study ordered for further evaluation.  Understands that he should not use his CPAP the night of the study.  Reviewed safe driving practices.  Healthy weight loss encouraged.  Patient Instructions  Continue to use CPAP every night, minimum of 4-6 hours a night.  Change equipment as directed. Wash your tubing with warm soap and water daily, hang to dry. Wash humidifier portion weekly. Use bottled, distilled water and change daily Be aware of reduced alertness and do not drive or operate heavy machinery if experiencing this or drowsiness.  Exercise encouraged, as tolerated. Healthy weight management discussed.  Avoid or decrease alcohol consumption and medications that make you more sleepy, if possible. Notify if persistent daytime sleepiness occurs even with consistent use of PAP therapy.  We will try to adjust your CPAP settings so you are more comfortable. Bring your SD card next week so I can see what you're set on and make appropriate changes  Orders  placed for an memory foam [place] Full Face mask   Given your interest in [place] and difficulties with CPAP, we will repeat your home sleep study to reassess your sleep apnea. Someone should contact you in the next 2-3 weeks to schedule this   Continue Stiolto 2 puffs daily Continue Albuterol inhaler 2 puffs every 6 hours as needed for shortness of breath or wheezing. Notify if symptoms persist despite rescue inhaler/neb use.  Continue flonase nasal spray 2 sprays each nostril daily  Follow up in 6-8 weeks with Katie Zakye Baby,NP to review sleep study results. Follow up with Dr. Judeth Horn as scheduled. If symptoms do not improve or worsen, please contact office for sooner follow up or seek emergency care.

## 2023-01-30 NOTE — Assessment & Plan Note (Signed)
Compensated on current regimen. Action plan in place.  

## 2023-02-04 ENCOUNTER — Telehealth: Payer: Self-pay | Admitting: Nurse Practitioner

## 2023-02-04 ENCOUNTER — Encounter: Payer: Self-pay | Admitting: Nurse Practitioner

## 2023-02-04 NOTE — Telephone Encounter (Signed)
Patient is dropping off SD card for Cobb NP to get the readings off of it. Will be placed in Cobb's box inside of white envelope.

## 2023-02-05 NOTE — Telephone Encounter (Signed)
Called and spoke with pt to pick up his sd card. Compliance has been given to [facility], nfn sending as fyi

## 2023-02-05 NOTE — Telephone Encounter (Signed)
Thanks

## 2023-02-05 NOTE — Telephone Encounter (Signed)
Johny Drilling, can you help with this? Thanks

## 2023-02-05 NOTE — Telephone Encounter (Signed)
Can we not just set him up with one of our home sleep study boxes? Thanks

## 2023-02-05 NOTE — Telephone Encounter (Signed)
Explained process to patient, he is still not comfortable with providing credit card info to [employer]. [place] Spaniel he will do in lab study instead if provider is okay with it

## 2023-02-05 NOTE — Telephone Encounter (Signed)
Suboptimal compliance. We are awaiting updated sleep study. Can determine next steps after this. Thanks!

## 2023-02-08 ENCOUNTER — Encounter: Payer: Self-pay | Admitting: Nurse Practitioner

## 2023-02-12 ENCOUNTER — Ambulatory Visit: Payer: Medicare Other

## 2023-02-12 DIAGNOSIS — R0683 Snoring: Secondary | ICD-10-CM

## 2023-02-12 DIAGNOSIS — G4733 Obstructive sleep apnea (adult) (pediatric): Secondary | ICD-10-CM

## 2023-02-14 ENCOUNTER — Ambulatory Visit (INDEPENDENT_AMBULATORY_CARE_PROVIDER_SITE_OTHER): Payer: Medicare Other | Admitting: Gastroenterology

## 2023-02-14 ENCOUNTER — Encounter: Payer: Self-pay | Admitting: Gastroenterology

## 2023-02-14 ENCOUNTER — Ambulatory Visit (INDEPENDENT_AMBULATORY_CARE_PROVIDER_SITE_OTHER): Payer: Medicare Other | Admitting: Nurse Practitioner

## 2023-02-14 VITALS — BP 122/68 | HR 73 | Ht 66.0 in | Wt 181.2 lb

## 2023-02-14 VITALS — BP 128/64 | HR 75 | Temp 99.0°F | Ht 66.0 in | Wt 181.5 lb

## 2023-02-14 DIAGNOSIS — I10 Essential (primary) hypertension: Secondary | ICD-10-CM

## 2023-02-14 DIAGNOSIS — R140 Abdominal distension (gaseous): Secondary | ICD-10-CM | POA: Diagnosis not present

## 2023-02-14 DIAGNOSIS — J310 Chronic rhinitis: Secondary | ICD-10-CM

## 2023-02-14 DIAGNOSIS — R12 Heartburn: Secondary | ICD-10-CM

## 2023-02-14 DIAGNOSIS — Z860100 Personal history of colon polyps, unspecified: Secondary | ICD-10-CM

## 2023-02-14 DIAGNOSIS — K219 Gastro-esophageal reflux disease without esophagitis: Secondary | ICD-10-CM

## 2023-02-14 DIAGNOSIS — R1110 Vomiting, unspecified: Secondary | ICD-10-CM

## 2023-02-14 NOTE — Assessment & Plan Note (Signed)
Chronic, at goal, stable Continue amlodipine 5 mg daily and losartan 50 mg daily

## 2023-02-14 NOTE — Progress Notes (Signed)
Established Patient Office Visit  Subjective   Patient ID: Grant [name], male    DOB: 1955-05-28  Age: 67 y.o. MRN: [id number]  Chief Complaint  Patient presents with   Medical Management of Chronic Issues    6 month follow up and ab sinus issue    Patient has today for the above.  He has chronic rhinitis, currently treats this with Flonase nasal spray.  Despite this still has significant congestion and nasal drainage.  Feels that it gets in the way of him being able to use his CPAP properly for which she uses to treat sleep apnea.  He also reports a history of nasal polyps, tells me [facility].  I do not have these records to review today.  He would like referral to ENT for evaluation.  Also reports abdominal bloating that can cause abdominal pain significant enough to wake him up at night.  Reports having a daily bowel movement.  Does not express concerns regarding constipation.  Has an appoint with gastroenterology later today.  Continues on amlodipine 5 mg daily losartan 50 mg daily for hypertension.  Tolerating medications well.    Review of Systems  HENT:  Positive for congestion.        (+) nasal drainage  Gastrointestinal:  Positive for abdominal pain.      Objective:     BP 128/64   Pulse 75   Temp 99 F (37.2 C) (Temporal)   Ht 5\' 6"  (1.676 m)   Wt 181 lb 8 oz (82.3 kg)   SpO2 96%   BMI 29.29 kg/m  BP Readings from Last 3 Encounters:  02/14/23 128/64  01/30/23 (!) 146/70  12/13/22 130/70   Wt Readings from Last 3 Encounters:  02/14/23 181 lb 8 oz (82.3 kg)  01/30/23 179 lb 9.6 oz (81.5 kg)  12/13/22 182 lb 2 oz (82.6 kg)      Physical Exam Vitals reviewed.  Constitutional:      Appearance: Normal appearance.  HENT:     Head: Normocephalic and atraumatic.  Cardiovascular:     Rate and Rhythm: Normal rate and regular rhythm.  Pulmonary:     Effort: Pulmonary effort is normal.     Breath  sounds: Normal breath sounds.  Musculoskeletal:     Cervical back: Neck supple.  Skin:    General: Skin is warm and dry.  Neurological:     Mental Status: He is alert and oriented to person, place, and time.  Psychiatric:        Mood and Affect: Mood normal.        Behavior: Behavior normal.        Thought Content: Thought content normal.        Judgment: Judgment normal.      No results found for any visits on 02/14/23.    The ASCVD Risk score (Arnett DK, et al., 2019) failed to calculate for the following reasons:   The valid total cholesterol range is 130 to 320 mg/dL    Assessment & Plan:   Problem List Items Addressed This Visit       Cardiovascular and Mediastinum   Hypertension    Chronic, at goal, stable Continue amlodipine 5 mg daily and losartan 50 mg daily        Respiratory   Chronic rhinitis - Primary    Chronic, per patient seems to be more symptomatic and more bothersome.  Referral to ENT made today.      Relevant Orders   Ambulatory referral to ENT     Other   Bloating    Chronic Low FODMAP eating plan provided to patient today.  Encouraged him to follow-up with GI as scheduled as well.       Return in about 6 months (around 08/15/2023) for F/U with Maralyn Sago.    Elenore Paddy, NP

## 2023-02-14 NOTE — Assessment & Plan Note (Signed)
Chronic Low FODMAP eating plan provided to patient today.  Encouraged him to follow-up with GI as scheduled as well.

## 2023-02-14 NOTE — Assessment & Plan Note (Signed)
Chronic, per patient seems to be more symptomatic and more bothersome.  Referral to ENT made today.

## 2023-02-14 NOTE — Progress Notes (Signed)
Chief Complaint:    GERD  GI History: 67 year old male with history of von Willebrand's disease, anemia, HTN, dyslipidemia, CAD, emphysema, RLS, anxiety, GERD, hiatal hernia, BCC, was in GI clinic for the following:   - 05/11/2019 colonoscopy for screening purposes excellent prep 3 per hyperplastic polyps 2 to 3 mm size rectosigmoid colon, 8 mm adenomatous polyp in the rectum, diverticulosis sigmoid and transverse, nonbleeding internal hemorrhoids.  Recall 7 years  - 11/07/2021: Evaluation in GI clinic for intermittent BRBPR x2-3 months.  Occasional straining to have BM.  Exam with internal hemorrhoids.  Was treated with Anusol suppositories and cream, recommended increased fiber, hydration, sitz bath, with plan for hemorrhoid banding if no significant improvement - 01/25/2022: Banding of LL hemorrhoid - 03/19/2022: Banding of RP hemorrhoid - 05/07/2022: Banding of RA hemorrhoid   Separately, longstanding history of GERD for many years, now generally well-controlled with Protonix. Index symptoms of heartburn, regurgitation, belching, waterbrash. No dysphagia.  EGD a few years ago at [facility].  HPI:     Patient is a 67 y.o. male presenting to the Gastroenterology Clinic for follow-up.   Reflux generally well-controlled with Protonix 40 mg BID.  Will use Pepcid OTC on demand if any breakthrough. Reflux worse with stress of father now in hospice.  No dysphagia.  Review of systems:     No chest pain, no SOB, no fevers, no urinary sx   Past Medical History:  Diagnosis Date   Acid reflux    Allergy    Anemia    Anxiety    Basal cell carcinoma (BCC) of nasal tip    History of hiatal hernia    Hypertension    Insomnia    RLS (restless legs syndrome)    Sensorineural hearing loss    Von Willebrand disease (HCC)    PT STATES THIS IS MILD AND HAS NEVER HAD TO SEE HEMATOLOGIST-PT DID SAY THAT WHEN HE WAS CIRCUMC[place] IN [place] [employer] YE[place] AGO HE BLED ALOT BUT IT WAS DUE TO NOT FOLLOWING POST OP  INSTRUCTIONS     Patient's surgical history, family medical history, social history, medications and allergies were all reviewed in Epic    Current Outpatient Medications  Medication Sig Dispense Refill   albuterol (VENTOLIN HFA) 108 (90 Base) MCG/ACT inhaler Inhale 2 puffs into the lungs every 6 (six) hours as needed for wheezing or shortness of breath. 8 g 0   amLODipine (NORVASC) 5 MG tablet TAKE 1 TABLET DAILY (FOLLOW UP DUE IN AUGUST WITH LABS) 90 tablet 3   celecoxib (CELEBREX) 200 MG capsule Take 1 capsule (200 mg total) by mouth 2 (two) times daily.     diclofenac Sodium (VOLTAREN) 1 % GEL APPLY 2 GM TOPICALLY FOUR TIMES A DAY AS NEEDED 400 g 3   EPINEPHrine 0.3 mg/0.3 mL IJ SOAJ injection INJECT 0.3 ML (0.3 MG) INTO THE MUSCLE ONCE FOR 1 DOSE 2 each 3   fluticasone (FLONASE) 50 MCG/ACT nasal spray Place 1 spray into both nostrils daily. 16 g 5   lidocaine-prilocaine (EMLA) cream 1 gram qid prn 30 g 11   losartan (COZAAR) 50 MG tablet TAKE 1 TABLET DAILY 90 tablet 3   pantoprazole (PROTONIX) 40 MG tablet TAKE 1 TABLET TWICE A DAY 180 tablet 1   rosuvastatin (CRESTOR) 5 MG tablet Take 1 tablet (5 mg total) by mouth daily. 90 tablet 3   Rotigotine (NEUPRO) 1 MG/24HR PT24 Place 1 patch (1 mg total) onto the skin daily at 4 PM. 90  patch 1   Tiotropium Bromide-Olodaterol (STIOLTO RESPIMAT) 2.5-2.5 MCG/ACT AERS Inhale 2 puffs into the lungs daily. 12 g 3   triamcinolone cream (KENALOG) 0.1 % Apply 1 Application topically 2 (two) times daily. 15 g 2   No current facility-administered medications for this visit.    Physical Exam:     BP 122/68 (BP Location: Left Arm, Patient Position: Sitting, Cuff Size: Normal)   Pulse 73   Ht 5\' 6"  (1.676 m)   Wt 181 lb 4 oz (82.2 kg)   SpO2 97%   BMI 29.25 kg/m   GENERAL:  Pleasant male in NAD PSYCH: : Cooperative, normal affect EMusculoskeletal:  Normal muscle tone, normal strength NEURO: Alert and oriented x 3, no focal neurologic  deficits   IMPRESSION and PLAN:    1) GERD 2) Heartburn 3) Regurgitation 67 year old male with longstanding history of GERD, characterized by heartburn, regurgitation, waterbrash, belching.  Symptoms now under control with high-dose PPI with occasional Pepcid for breakthrough. - Continue Protonix 40 mg twice daily - Continue Pepcid complete OTC on demand if breakthrough - Continue antireflux lifestyle/Trejo modifications - Will request previous EGD report from [facility] for review - Depending on previous EGD findings, can consider role/utility of repeat EGD as appropriate - Patient requesting assistance with filling out [facility] documentation for his history of GERD.  Happy to assist and provide necessary documentation   4) History of colon polyps - Repeat colonoscopy in 2028 for ongoing polyp surveillance         RTC in 1 year or sooner prn   Shellia Cleverly ,DO, FACG 02/14/2023, 3:21 PM

## 2023-02-14 NOTE — Patient Instructions (Signed)
_______________________________________________________  If your blood pressure at your visit was 140/90 or greater, please contact your primary care physician to follow up on this.  _______________________________________________________  If you are age 68 or older, your body mass index should be between 23-30. Your Body mass index is 29.25 kg/m. If this is out of the aforementioned range listed, please consider follow up with your Primary Care Provider.  If you are age 86 or younger, your body mass index should be between 19-25. Your Body mass index is 29.25 kg/m. If this is out of the aformentioned range listed, please consider follow up with your Primary Care Provider.   ________________________________________________________  The [place] [facility] providers would like to encourage you to use [facility] to communicate with providers for non-urgent requests or questions.  Due to long hold times on the telephone, sending your provider a message by [facility] may be a faster and more efficient way to get a response.  Please allow 48 business hours for a response.  Please remember that this is for non-urgent requests.  _______________________________________________________  Grant [name] will need a follow up appointment in 1 year.  We will contact you to schedule this appointment.   It was a pleasure to see you today!  Vito Cirigliano, D.O.

## 2023-02-17 ENCOUNTER — Encounter (INDEPENDENT_AMBULATORY_CARE_PROVIDER_SITE_OTHER): Payer: Self-pay | Admitting: Otolaryngology

## 2023-02-24 ENCOUNTER — Telehealth: Payer: Self-pay

## 2023-02-24 NOTE — Patient Instructions (Addendum)
Please continue using your CPAP regularly. While your insurance requires that you use CPAP at least 4 hours each night on 70% of the nights, I recommend, that you not skip any nights and use it throughout the night if you can. Getting used to CPAP and staying with the treatment long term does take time and patience and discipline. Untreated obstructive sleep apnea when it is moderate to severe can have an adverse impact on cardiovascular health and raise her risk for heart disease, arrhythmias, hypertension, congestive heart failure, stroke and diabetes. Untreated obstructive sleep apnea causes sleep disruption, nonrestorative sleep, and sleep deprivation. This can have an impact on your day to day functioning and cause daytime sleepiness and impairment of cognitive function, memory loss, mood disturbance, and problems focussing. Using CPAP regularly can improve these symptoms.  I recommend that you stay in close with Jiles Prows, Micheline Maze and Dr Tat. Please let me know if you need me.   No follow up needed

## 2023-02-24 NOTE — Telephone Encounter (Signed)
Called pt and asked him to bring his CPAP Machine to his appointment for tomorrow 02/25/2023. Pt stated that he would bring his machine.

## 2023-02-24 NOTE — Progress Notes (Signed)
Chief Complaint  Patient presents with   Follow-up    Pt in room 2 alone. Here for cpap follow up. Pt reports having sinus issues,cpap is causing sinus congestion. Pt said when not using cpap he has not had any sinus issues. Reports he stop using for last month and a half.     HISTORY OF PRESENT ILLNESS:  02/25/23 ALL: Grant [name] returns for follow up for OSA on CPAP, neuropathy and PLMD. He was last seen by me 02/2022 and doing well on CPAP. Diclofenac and Elma cream helped with neuropathy and he continued Neupro patch daily with ropinirole 0.25mg  BID as needed. He was seen in consult with Dr Tat 06/2022 to evaluate tremor. DaT scan offered but declined. He was encouraged to discontinue ropinirole and continue only Neupro patch.   Since, he reports RLS is under good control on Neupro alone. He has discontinued ropinirole. He feels neuropathy is stable on topical Elma and diclofenac. He reports CPAP causes him to get more congested. He has not used therapy consistently due to the congestion. He is using Flonase daily with minimal relief. He was seen by PCP who referred him to ENT for chronic rhinitis and reported history of nasal polyps. He was seen by Micheline Maze, NP with pulmonology who repeated HST. Results showed AHI 5/hr. O2 nadir 87%. He tells me that he is planning to discuss having Inspire implanted.     02/25/2022 ALL:  Grant [name] returns for follow up for OSA on CPAP, neuropathy and PLMD. He was last seen by Dr Terrace Arabia. Biopsy consistent with small fiber neuropathy. He has continued diclofenac and lidocaine cream. Neuropathy pain is improved. R>L. Neupro continued for RLS. He also takes ropinirole 0.25mg  BID. RLS is well managed. He has to use Flonase at application site but feels that works well. He rotates sites. No obvious skin irritation noted. He is doing well with CPAP therapy. Now using nasal pillow. He does feel he rests better with therapy.     01/02/2021 ALL: [name] returns for  follow up for PLMD. He continues Neupro patch 1mg  daily and ropinirole 0.25mg  1-2 times daily. He reports significant improvement in restless leg symptoms. He is sleeping better but does continue to wake up multiple times during the night. No obvious etiology. He monitors sleep on a Fitbit like device. He sleeps for about 5-6 hours a night. Device records that he is getting about 20% REM sleep. He feels that he continues to have excessive daytime sleepiness. He states that sleepiness waxes and wanes. ESS score is 16/24 today and he feels this is pretty accurate reflexion of his sleepiness. He has a hard time staying awake if sitting still and states he has felt like he was going to nod off while driving on occasion. He has never actually fallen asleep while driving. Split night study in 2018 that showed overall normal AHI with mild OSA in supine and REM sleep. He drinks about 2 cans of green tea daily. He is drinking 4-6 bottles of water daily. He feels decreased caffeine helps with RLS but he is more sleepy. He does wake up 2-3 times a night, usually to use restroom. Most of the time he can get back to sleep but sometimes it can take about an hour. He took melatonin several years ago but wasn't sure it helped but admits that he was having more difficulty with RLS then. He reports getting addicted to "sleep meds" with previous PCP. He thinks it was a hypnotic.  01/03/2020 ALL:  Grant [name] is a 67 y.o. male here today for follow up for PLMD and RLS. He was switched to Neupro patch in 10/2019. He continues ropinirole 0.25mg  BID as needed. He has adjusted well to using patch. He feels daytime sleepiness has improved. He was started back on gabapentin with GI, however, he did not tolerate it well. He reports more depression when taking it. He has discontinued it and feeling well.   07/01/2019 ALL: Grant [name] is a 67 y.o. male here today for follow up for RLS. Sleep study in 07/2016 showed normal AHI but severe  PMLD. He was previously taking 13 tablets of 0.25mg  ropinirole throughout the day. We swithced to extended release in 10/2018. He reports taking ropinirole XL 2 mg twice daily for a couple of months. He continues to have leg twitching. He has tried multiple different dosing times.  Most recently, he has taken ropinirole ER 2 mg at bedtime as well as ropinirole IR 0.25 mg 1-2 times throughout the day.  He feels that this regimen has been most beneficial.  Total daily dosing of 2.5 mg.    04/01/2019 ALL:  Grant [name] is a 67 y.o. male here today for follow up. 3,3 2at 5, 2at 9 and 3 at bedtime . He continues to have trouble with restlessness and jerking of both legs. He is taking 3 tablets every morning, 3 at lunch, 2 around 5pm, 2 at 9pm and 3 at bedtime for a total of 13 tablets daily (3.25mg .) He has had dizziness for the past couple of years but does not feel it is related to ropinirole. No side effects noted after taking ropinirole. Has tried Lyrica and gabapentin in the past but reports that he did not feel well on these. He reports suicidal ideations with both.     HISTORY: (copied from [employer] note on 11/27/2017)   [name] is a 67 year old right-handed gentleman with an underlying medical history of hypertension, reflux disease, allergies, and overweight state, who presents for follow up consultation of his restless legs and PLMS. The patient is unaccompanied today. I last saw him on 11/19/2016, at which time he was taking ropinirole 0.25 mg strength at 3 different times. He had recently suffered serious dog bites that became infected and needed treatment for this as an inpatient. I suggested cautious increase in his Requip 0.25 mg strength 2 pills 3 times a day. He was advised regarding augmentation.    Today, 07/15/2017 (all dictated new, as well as above notes, some dictation done in note pad or Word, outside of chart, may appear as copied):  He reports feeling about the same, maybe a  little better. He takes requip 1 pill at 6 AM (2 pills was too much in AM), 1 to 2 at 4:30 and 2 at BT, which ranges from 10 PM to MN. Sometimes he has symptoms in his arms. Sx of RLS date back to several years ago. He may have tried gabapentin in the past for Back pain, not sure if he tolerated it. He would be willing to retry it.    UPDATE 9/19/2019CM  Mr. Carlon, 67 year old male returns for follow-up with history of restless leg syndrome.  He claims that his restless legs are a little bit worse.  He thinks his Toprol is making his restless legs worse so he has stopped the medication and wants [place] to make a suggestion however in reviewing side effects of Toprol restless legs is not  a side effect.  His blood pressure is elevated in the office today at 156/61.  He was encouraged to follow-up with his primary care regarding his blood pressure.  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  His legs are more bothersome today because he had to climb a ladder.  He was started back on gabapentin after his last visit in May with Dr. Frances Furbish, however patient states once he got to 300 mg he had suicidal idealizations and he cut it back to 100 mg.  He has not had further suicidal idealzations on that dose.  He currently takes Requip 0.25mg  3 tablets twice a day.  He has a history of anemia in the past but his most recent CBC with hemoglobin of 13.5.  He returns for reevaluation    REVIEW OF SYSTEMS: Out of a complete 14 system review of symptoms, the patient complains only of the following symptoms, RLS, daytime sleepiness, and all other reviewed systems are negative.  ESS: 12/24, previously 8-9/24   ALLERGIES: Allergies  Allergen Reactions   Bee Venom Anaphylaxis, Swelling and Rash   Asa [Aspirin] Other (See Comments)    Gi upset   Atorvastatin Other (See Comments)    Joint pain   Duloxetine Other (See Comments)    GI upset   Zetia [Ezetimibe] Other (See Comments)    Joint pain     HOME  MEDICATIONS: Outpatient Medications Prior to Visit  Medication Sig Dispense Refill   albuterol (VENTOLIN HFA) 108 (90 Base) MCG/ACT inhaler Inhale 2 puffs into the lungs every 6 (six) hours as needed for wheezing or shortness of breath. 8 g 0   amLODipine (NORVASC) 5 MG tablet TAKE 1 TABLET DAILY (FOLLOW UP DUE IN AUGUST WITH LABS) 90 tablet 3   diclofenac Sodium (VOLTAREN) 1 % GEL APPLY 2 GM TOPICALLY FOUR TIMES A DAY AS NEEDED 400 g 3   EPINEPHrine 0.3 mg/0.3 mL IJ SOAJ injection INJECT 0.3 ML (0.3 MG) INTO THE MUSCLE ONCE FOR 1 DOSE 2 each 3   fluticasone (FLONASE) 50 MCG/ACT nasal spray Place 1 spray into both nostrils daily. 16 g 5   lidocaine-prilocaine (EMLA) cream 1 gram qid prn 30 g 11   losartan (COZAAR) 50 MG tablet TAKE 1 TABLET DAILY 90 tablet 3   pantoprazole (PROTONIX) 40 MG tablet TAKE 1 TABLET TWICE A DAY 180 tablet 1   rosuvastatin (CRESTOR) 5 MG tablet Take 1 tablet (5 mg total) by mouth daily. 90 tablet 3   Rotigotine (NEUPRO) 1 MG/24HR PT24 Place 1 patch (1 mg total) onto the skin daily at 4 PM. 90 patch 1   Tiotropium Bromide-Olodaterol (STIOLTO RESPIMAT) 2.5-2.5 MCG/ACT AERS Inhale 2 puffs into the lungs daily. 12 g 3   triamcinolone cream (KENALOG) 0.1 % Apply 1 Application topically 2 (two) times daily. 15 g 2   celecoxib (CELEBREX) 200 MG capsule Take 1 capsule (200 mg total) by mouth 2 (two) times daily. (Patient not taking: Reported on 02/25/2023)     No facility-administered medications prior to visit.     PAST MEDICAL HISTORY: Past Medical History:  Diagnosis Date   Acid reflux    Allergy    Anemia    Anxiety    Basal cell carcinoma (BCC) of nasal tip    History of hiatal hernia    Hypertension    Insomnia    RLS (restless legs syndrome)    Sensorineural hearing loss    Von Willebrand disease (HCC)    PT  STATES THIS IS MILD AND HAS NEVER HAD TO SEE HEMATOLOGIST-PT DID SAY THAT WHEN HE WAS CIRCUM[place] IN [place] YE[place] AGO HE BLED ALOT BUT IT WAS DUE TO  NOT FOLLOWING POST OP INSTRUCTIONS      PAST SURGICAL HISTORY: Past Surgical History:  Procedure Laterality Date   CIRCUMCISION     AS AN ADULT   COLONOSCOPY     FOOT SURGERY Right    arch support   HERNIA REPAIR     INCISION AND DRAINAGE Right 02/04/2013   Procedure: INCISION AND DRAINAGE;  Surgeon: Sharma Covert, MD;  Location: [facility] OR;  Service: Orthopedics;  Laterality: Right;   INGUINAL HERNIA REPAIR Bilateral 05/13/2018   Procedure: LAPAROSCOPIC BILATERAL INGUINAL HERNIA REPAIR;  Surgeon: Henrene Dodge, MD;  Location: [facility] ORS;  Service: General;  Laterality: Bilateral;   INGUINAL HERNIA REPAIR Right 06/17/2019   Procedure: HERNIA REPAIR INGUINAL ADULT WITH MESH-- Recurrent;  Surgeon: Henrene Dodge, MD;  Location: [facility] ORS;  Service: General;  Laterality: Right;   OPEN REDUCTION INTERNAL FIXATION (ORIF) FINGER WITH RADIAL BONE GRAFT Right 02/04/2013   Procedure: OPEN REDUCTION INTERNAL FIXATION (ORIF) right ring and small fingers;  Surgeon: Sharma Covert, MD;  Location: [facility] OR;  Service: Orthopedics;  Laterality: Right;     FAMILY HISTORY: Family History  Problem Relation Age of Onset   Hypertension Mother    Hyperlipidemia Mother    Hypertension Father    Hyperlipidemia Father    Stroke Father        x2   Colon cancer Neg Hx    Esophageal cancer Neg Hx    Rectal cancer Neg Hx    Stomach cancer Neg Hx    Sleep apnea Neg Hx    Neuropathy Neg Hx    Pancreatic cancer Neg Hx      SOCIAL HISTORY: Social History   Socioeconomic History   Marital status: Married    Spouse name: Not on file   Number of children: 4   Years of education: 12   Highest education level: Not on file  Occupational History   Occupation: Copywriter, advertising    Occupation: retired    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  Tobacco Use   Smoking status: Former    Current packs/day: 0.00    Average packs/day: 0.5 packs/day for 40.0 years (20.0 ttl pk-yrs)    Types: Cigarettes    Start date:  05/10/1976    Quit date: 05/10/2016    Years since quitting: 6.8   Smokeless tobacco: Never  Vaping Use   Vaping status: Former   Quit date: 05/10/2016  Substance and Sexual Activity   Alcohol use: No   Drug use: Yes    Types: Marijuana    Comment: uses daily   Sexual activity: Not on file  Other Topics Concern   Not on file  Social History Narrative   Denies caffeine use    Right handed    Social Drivers of Health   Financial Resource Strain: Patient Declined (08/21/2022)   Overall Financial Resource Strain (CARDIA)    Difficulty of Paying Living Expenses: Patient declined  Food Insecurity: Patient Declined (08/21/2022)   Hunger Vital Sign    Worried About Running Out of Food in the Last Year: Patient declined    Ran Out of Food in the Last Year: Patient declined  Transportation Needs: Patient Declined (08/21/2022)   PRAPARE - Administrator, Civil Service (Medical): Patient declined    Lack  of Transportation (Non-Medical): Patient declined  Physical Activity: Unknown (02/10/2023)   Exercise Vital Sign    Days of Exercise per Week: Patient declined    Minutes of Exercise per Session: Not on file  Stress: Patient Declined (02/10/2023)   [employer] of Occupational Health - Occupational Stress Questionnaire    Feeling of Stress : Patient declined  Social Connections: Unknown (02/10/2023)   Social Connection and Isolation Panel [NHANES]    Frequency of Communication with Friends and Family: Patient declined    Frequency of Social Gatherings with Friends and Family: Patient declined    Attends Religious Services: Patient declined    Database administrator or Organizations: Patient declined    Attends Banker Meetings: Not on file    Marital Status: Married  Intimate Partner Violence: Not At Risk (12/12/2021)   Humiliation, Afraid, Rape, and Kick questionnaire    Fear of Current or Ex-Partner: No    Emotionally Abused: No    Physically Abused: No     Sexually Abused: No      PHYSICAL EXAM  Vitals:   02/25/23 1008  BP: 132/61  Pulse: 65  Weight: 182 lb (82.6 kg)  Height: 5\' 6"  (1.676 m)      Body mass index is 29.38 kg/m.   Generalized: Well developed, in no acute distress   Neurological examination  Mentation: Alert oriented to time, place, history taking. Follows all commands speech and language fluent Cranial nerve II-XII: Pupils were equal round reactive to light. Extraocular movements were full, visual field were full on confrontational test. Facial sensation and strength were normal. Head turning and shoulder shrug  were normal and symmetric. Motor: The motor testing reveals 5 over 5 strength of all 4 extremities. Good symmetric motor tone is noted throughout.  Sensory: Sensory testing is intact to soft touch on all 4 extremities. No evidence of extinction is noted.  Coordination: Cerebellar testing reveals good finger-nose-finger and heel-to-shin bilaterally.  Gait and station: Gait is normal.  Reflexes: Deep tendon reflexes are symmetric and normal bilaterally.     DIAGNOSTIC DATA (LABS, IMAGING, TESTING) - I reviewed patient records, labs, notes, testing and imaging myself where available.  Lab Results  Component Value Date   WBC 6.0 06/07/2021   HGB 14.3 06/07/2021   HCT 42.3 06/07/2021   MCV 91.3 06/07/2021   PLT 253.0 06/07/2021      Component Value Date/Time   NA 138 11/07/2022 0837   NA 142 07/03/2021 1535   K 4.3 11/07/2022 0837   CL 105 11/07/2022 0837   CO2 26 11/07/2022 0837   GLUCOSE 100 (H) 11/07/2022 0837   BUN 17 11/07/2022 0837   BUN 14 07/03/2021 1535   CREATININE 0.90 11/07/2022 0837   CREATININE 0.88 03/26/2021 1106   CALCIUM 9.4 11/07/2022 0837   PROT 6.9 11/07/2022 0837   PROT 6.7 07/03/2021 1535   ALBUMIN 4.3 11/07/2022 0837   ALBUMIN 4.7 07/03/2021 1535   AST 26 11/07/2022 0837   ALT 33 11/07/2022 0837   ALKPHOS 83 11/07/2022 0837   BILITOT 0.5 11/07/2022 0837    BILITOT 0.4 07/03/2021 1535   GFRNONAA 83 10/25/2019 0817   GFRAA 96 10/25/2019 0817   Lab Results  Component Value Date   CHOL 103 11/07/2022   HDL 43.10 11/07/2022   LDLCALC 51 11/07/2022   TRIG 43.0 11/07/2022   CHOLHDL 2 11/07/2022   Lab Results  Component Value Date   HGBA1C 5.6 07/03/2021   Lab Results  Component Value Date   VITAMINB12 539 07/24/2021   Lab Results  Component Value Date   TSH 1.58 03/26/2021      ASSESSMENT AND PLAN  67 y.o. year old male  has a past medical history of Acid reflux, Allergy, Anemia, Anxiety, Basal cell carcinoma (BCC) of nasal tip, History of hiatal hernia, Hypertension, Insomnia, RLS (restless legs syndrome), Sensorineural hearing loss, and Von Willebrand disease (HCC). here with   PLMD (periodic limb movement disorder)  OSA on CPAP  Neuropathy  Tabias is doing well on Neupro patch daily per Dr Don Perking recommendation and has discontinued ropinirole. Neuropathy appears well managed on Emla and diclofenac applied topically. Compliance data shows sub optimal compliance. Recent HST showed AHI 5/hr. He tells me that he is planning to pursue Inspire therapy through pulmonology. I am not certain he qualifies and have discussed considering trial off therapy and continued efforts of weight management. He wishes to transfer care of OSA to pulmonology where is followed for COPD. He also wishes to continue follow up with Dr Tat for management of RLS and neuropathy. I wish him all the best and will notify Dr Frances Furbish and Dr Terrace Arabia. He was encouraged to continue working on healthy lifestyle habits. No follow up with [facility] needed. He verbalizes understanding and agreement with this plan.    No orders of the defined types were placed in this encounter.    No orders of the defined types were placed in this encounter.    I spent 30 minutes of face-to-face and non-face-to-face time with patient.  This included previsit chart review, lab review, study review,  order entry, electronic health record documentation, patient education.   Shawnie Dapper, MSN, FNP-C 02/25/2023, 10:48 AM  [facility] [address], [address] [place], [place] [address] [phone]

## 2023-02-25 ENCOUNTER — Ambulatory Visit (INDEPENDENT_AMBULATORY_CARE_PROVIDER_SITE_OTHER): Payer: Medicare Other | Admitting: Family Medicine

## 2023-02-25 ENCOUNTER — Encounter: Payer: Self-pay | Admitting: Family Medicine

## 2023-02-25 VITALS — BP 132/61 | HR 65 | Ht 66.0 in | Wt 182.0 lb

## 2023-02-25 DIAGNOSIS — G629 Polyneuropathy, unspecified: Secondary | ICD-10-CM | POA: Diagnosis not present

## 2023-02-25 DIAGNOSIS — G4761 Periodic limb movement disorder: Secondary | ICD-10-CM

## 2023-02-25 DIAGNOSIS — G4733 Obstructive sleep apnea (adult) (pediatric): Secondary | ICD-10-CM

## 2023-02-28 ENCOUNTER — Telehealth: Payer: Self-pay

## 2023-02-28 NOTE — Telephone Encounter (Signed)
    13 pages of [facility] records received by fax and placed in Dr Frankey Shown Inbox for his review.

## 2023-03-03 NOTE — Telephone Encounter (Signed)
Patient aware that Dr Barron Alvine has reviewed colonoscopy/EGD records from [facility].  Patient stated he does not wish to schedule EGD now, and will follow up with Dr Barron Alvine in 1 year.  No further questions or concerns.

## 2023-03-17 ENCOUNTER — Encounter: Payer: Self-pay | Admitting: Neurology

## 2023-03-18 ENCOUNTER — Encounter: Payer: Self-pay | Admitting: Family Medicine

## 2023-03-18 ENCOUNTER — Encounter: Payer: Self-pay | Admitting: Nurse Practitioner

## 2023-03-18 DIAGNOSIS — G4730 Sleep apnea, unspecified: Secondary | ICD-10-CM

## 2023-03-18 DIAGNOSIS — G2581 Restless legs syndrome: Secondary | ICD-10-CM

## 2023-03-18 DIAGNOSIS — R251 Tremor, unspecified: Secondary | ICD-10-CM

## 2023-03-19 ENCOUNTER — Other Ambulatory Visit: Payer: Self-pay

## 2023-03-24 ENCOUNTER — Other Ambulatory Visit: Payer: Self-pay | Admitting: Pulmonary Disease

## 2023-03-31 ENCOUNTER — Ambulatory Visit (INDEPENDENT_AMBULATORY_CARE_PROVIDER_SITE_OTHER): Payer: Medicare Other

## 2023-03-31 ENCOUNTER — Encounter: Payer: Self-pay | Admitting: Nurse Practitioner

## 2023-03-31 ENCOUNTER — Ambulatory Visit (INDEPENDENT_AMBULATORY_CARE_PROVIDER_SITE_OTHER): Payer: Medicare Other | Admitting: Nurse Practitioner

## 2023-03-31 VITALS — BP 130/58 | HR 78 | Ht 66.0 in | Wt 183.8 lb

## 2023-03-31 VITALS — Ht 66.0 in | Wt 180.0 lb

## 2023-03-31 DIAGNOSIS — J329 Chronic sinusitis, unspecified: Secondary | ICD-10-CM

## 2023-03-31 DIAGNOSIS — G4733 Obstructive sleep apnea (adult) (pediatric): Secondary | ICD-10-CM

## 2023-03-31 DIAGNOSIS — J432 Centrilobular emphysema: Secondary | ICD-10-CM

## 2023-03-31 DIAGNOSIS — G2581 Restless legs syndrome: Secondary | ICD-10-CM

## 2023-03-31 DIAGNOSIS — Z0000 Encounter for general adult medical examination without abnormal findings: Secondary | ICD-10-CM | POA: Diagnosis not present

## 2023-03-31 NOTE — Progress Notes (Signed)
Subjective:   Grant [name] is a 69 y.o. male who presents for Medicare Annual/Subsequent preventive examination.  Visit Complete: Virtual I connected with  Grant [name] on 03/31/23 by a audio enabled telemedicine application and verified that I am speaking with the correct person using two identifiers.  Patient Location: Home  Provider Location: Office/Clinic  I discussed the limitations of evaluation and management by telemedicine. The patient expressed understanding and agreed to proceed.  Vital Signs: Because this visit was a virtual/telehealth visit, some criteria may be missing or patient reported. Any vitals not documented were not able to be obtained and vitals that have been documented are patient reported.  Patient Medicare AWV questionnaire was completed by the patient on 03/30/2023; I have confirmed that all information answered by patient is correct and no changes since this date.  Cardiac Risk Factors include: advanced age (>58men, >64 women);male gender;hypertension;Other (see comment), Risk factor comments: OSA, Aortic atherosclerosis ,Centrilobular emphysema     Objective:    Today's Vitals   03/31/23 0806  Weight: 180 lb (81.6 kg)  Height: 5\' 6"  (1.676 m)   Body mass index is 29.05 kg/m.     03/31/2023    8:16 AM 06/18/2022    9:35 AM 12/12/2021    9:05 AM 06/01/2020    9:33 AM 06/17/2019    8:01 AM 06/10/2019    8:47 AM 05/13/2018   10:28 AM  Advanced Directives  Does Patient Have a Medical Advance Directive? No No No No  No No  Would patient like information on creating a medical advance directive?   No - Patient declined Yes (MAU/Ambulatory/Procedural Areas - Information given)  No - Patient declined No - Patient declined     Information is confidential and restricted. Go to Review Flowsheets to unlock data.    Current Medications (verified) Outpatient Encounter Medications as of 03/31/2023  Medication Sig   albuterol (VENTOLIN HFA) 108 (90 Base) MCG/ACT  inhaler Inhale 2 puffs into the lungs every 6 (six) hours as needed for wheezing or shortness of breath.   amLODipine (NORVASC) 5 MG tablet TAKE 1 TABLET DAILY (FOLLOW UP DUE IN AUGUST WITH LABS)   celecoxib (CELEBREX) 200 MG capsule Take 1 capsule (200 mg total) by mouth 2 (two) times daily.   diclofenac Sodium (VOLTAREN) 1 % GEL APPLY 2 GM TOPICALLY FOUR TIMES A DAY AS NEEDED   EPINEPHrine 0.3 mg/0.3 mL IJ SOAJ injection INJECT 0.3 ML (0.3 MG) INTO THE MUSCLE ONCE FOR 1 DOSE   fluticasone (FLONASE) 50 MCG/ACT nasal spray Place 1 spray into both nostrils daily.   lidocaine-prilocaine (EMLA) cream 1 gram qid prn   losartan (COZAAR) 50 MG tablet TAKE 1 TABLET DAILY   pantoprazole (PROTONIX) 40 MG tablet TAKE 1 TABLET TWICE A DAY   rosuvastatin (CRESTOR) 5 MG tablet Take 1 tablet (5 mg total) by mouth daily.   Rotigotine (NEUPRO) 1 MG/24HR PT24 Place 1 patch (1 mg total) onto the skin daily at 4 PM.   STIOLTO RESPIMAT 2.5-2.5 MCG/ACT AERS USE 2 INHALATIONS DAILY   triamcinolone cream (KENALOG) 0.1 % Apply 1 Application topically 2 (two) times daily.   No facility-administered encounter medications on file as of 03/31/2023.    Allergies (verified) Bee venom, Asa [aspirin], Atorvastatin, Duloxetine, and Zetia [ezetimibe]   History: Past Medical History:  Diagnosis Date   Acid reflux    Allergy    Anemia    Anxiety    Basal cell carcinoma (BCC) of nasal tip  History of hiatal hernia    Hypertension    Insomnia    RLS (restless legs syndrome)    Sensorineural hearing loss    Von Willebrand disease (HCC)    PT STATES THIS IS MILD AND HAS NEVER HAD TO SEE HEMATOLOGIST-PT DID SAY THAT WHEN HE WAS [place] HE BLED ALOT BUT IT WAS DUE TO NOT FOLLOWING POST OP INSTRUCTIONS    Past Surgical History:  Procedure Laterality Date   CIRCUMCISION     AS AN ADULT   COLONOSCOPY     FOOT SURGERY Right    arch support   HERNIA REPAIR     INCISION AND DRAINAGE Right  02/04/2013   Procedure: INCISION AND DRAINAGE;  Surgeon: Sharma Covert, MD;  Location: [facility] OR;  Service: Orthopedics;  Laterality: Right;   INGUINAL HERNIA REPAIR Bilateral 05/13/2018   Procedure: LAPAROSCOPIC BILATERAL INGUINAL HERNIA REPAIR;  Surgeon: Grant Dodge, MD;  Location: [facility] ORS;  Service: General;  Laterality: Bilateral;   INGUINAL HERNIA REPAIR Right 06/17/2019   Procedure: HERNIA REPAIR INGUINAL ADULT WITH MESH-- Recurrent;  Surgeon: Grant Dodge, MD;  Location: [facility] ORS;  Service: General;  Laterality: Right;   OPEN REDUCTION INTERNAL FIXATION (ORIF) FINGER WITH RADIAL BONE GRAFT Right 02/04/2013   Procedure: OPEN REDUCTION INTERNAL FIXATION (ORIF) right ring and small fingers;  Surgeon: Sharma Covert, MD;  Location: [facility] OR;  Service: Orthopedics;  Laterality: Right;   Family History  Problem Relation Age of Onset   Hypertension Mother    Hyperlipidemia Mother    Hypertension Father    Hyperlipidemia Father    Stroke Father        x2   Colon cancer Neg Hx    Esophageal cancer Neg Hx    Rectal cancer Neg Hx    Stomach cancer Neg Hx    Sleep apnea Neg Hx    Neuropathy Neg Hx    Pancreatic cancer Neg Hx    Social History   Socioeconomic History   Marital status: Married    Spouse name: Grant [name]   Number of children: 4   Years of education: 12   Highest education level: Not on file  Occupational History   Occupation: Copywriter, advertising    Occupation: retired    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  Tobacco Use   Smoking status: Former    Current packs/day: 0.00    Average packs/day: 0.5 packs/day for 40.0 years (20.0 ttl pk-yrs)    Types: Cigarettes    Start date: 05/10/1976    Quit date: 05/10/2016    Years since quitting: 6.8   Smokeless tobacco: Never  Vaping Use   Vaping status: Former   Quit date: 05/10/2016  Substance and Sexual Activity   Alcohol use: No   Drug use: Yes    Types: Marijuana    Comment: uses daily   Sexual activity: Not on file  Other  Topics Concern   Not on file  Social History Narrative   Denies caffeine use    Right handed    Lives with spouse and his daughter and 2 grandkids   Social Drivers of Corporate investment banker Strain: Low Risk  (03/31/2023)   Overall Financial Resource Strain (CARDIA)    Difficulty of Paying Living Expenses: Not very hard  Food Insecurity: No Food Insecurity (03/31/2023)   Hunger Vital Sign    Worried About Running Out of Food in the Last Year: Never true  Ran Out of Food in the Last Year: Never true  Transportation Needs: No Transportation Needs (03/31/2023)   PRAPARE - Administrator, Civil Service (Medical): No    Lack of Transportation (Non-Medical): No  Physical Activity: Inactive (03/31/2023)   Exercise Vital Sign    Days of Exercise per Week: 0 days    Minutes of Exercise per Session: 0 min  Stress: No Stress Concern Present (03/31/2023)   [employer] of Occupational Health - Occupational Stress Questionnaire    Feeling of Stress : Not at all  Social Connections: Moderately Integrated (03/31/2023)   Social Connection and Isolation Panel [NHANES]    Frequency of Communication with Friends and Family: More than three times a week    Frequency of Social Gatherings with Friends and Family: Never    Attends Religious Services: Never    Database administrator or Organizations: Yes    Attends Engineer, structural: 1 to 4 times per year    Marital Status: Married    Tobacco Counseling Counseling given: Not Answered   Clinical Intake:  Pre-visit preparation completed: Yes  Pain : No/denies pain     BMI - recorded: 29.05 Nutritional Status: BMI 25 -29 Overweight Nutritional Risks: None Diabetes: No  How often do you need to have someone help you when you read instructions, pamphlets, or other written materials from your doctor or pharmacy?: 1 - Never  Interpreter Needed?: No  Information entered by :: Grant [name], RMA   Activities of  Daily Living    03/30/2023   11:03 PM  In your present state of health, do you have any difficulty performing the following activities:  Hearing? 1  Vision? 0  Difficulty concentrating or making decisions? 1  Walking or climbing stairs? 1  Dressing or bathing? 0  Doing errands, shopping? 0  Preparing Food and eating ? N  Using the Toilet? N  In the past six months, have you accidently leaked urine? N  Do you have problems with loss of bowel control? N  Managing your Medications? N  Managing your Finances? N  Housekeeping or managing your Housekeeping? N    Patient Care Team: Grant Paddy, NP as PCP - General (Nurse Practitioner) Grant Ouch, MD as PCP - Cardiology (Cardiology) Grant Dodge, MD as Consulting Physician (General Surgery) Tat, Grant Batty, DO as Consulting Physician (Neurology)  Indicate any recent Medical Services you may have received from other than [facility] providers in the past year (date may be approximate).     Assessment:   This is a routine wellness examination for Grant [name].  Hearing/Vision screen Hearing Screening - Comments:: Has some hearing loss Vision Screening - Comments:: Wears eyeglasses   Goals Addressed             This Visit's Progress    My goal is to get off some of my medication.   On track    Has lost some weight.      Depression Screen    03/31/2023    8:21 AM 02/14/2023    8:12 AM 11/07/2022    8:08 AM 08/22/2022   10:33 AM 05/09/2022    8:20 AM 12/20/2021    8:10 AM 12/12/2021    8:50 AM  PHQ 2/9 Scores  PHQ - 2 Score 2 0 3 0 0 0 0  PHQ- 9 Score 3  10  2 5      Fall Risk    03/30/2023   11:03 PM 02/14/2023  8:12 AM 11/07/2022    8:07 AM 08/22/2022   10:33 AM 06/18/2022    9:36 AM  Fall Risk   Falls in the past year? 0 0 0 0 0  Number falls in past yr:  0 0 0 0  Injury with Fall?  0 0 0 0  Risk for fall due to :  No Fall Risks No Fall Risks No Fall Risks   Follow up Falls evaluation completed;Falls prevention  discussed Falls evaluation completed Falls evaluation completed Falls evaluation completed Falls evaluation completed    MEDICARE RISK AT HOME: Medicare Risk at Home Any stairs in or around the home?: (Patient-Rptd) Yes If so, are there any without handrails?: (Patient-Rptd) No Home free of loose throw rugs in walkways, pet beds, electrical cords, etc?: (Patient-Rptd) No Adequate lighting in your home to reduce risk of falls?: (Patient-Rptd) Yes Life alert?: (Patient-Rptd) No Use of a cane, walker or w/c?: (Patient-Rptd) No Grab bars in the bathroom?: (Patient-Rptd) Yes Shower chair or bench in shower?: (Patient-Rptd) Yes Elevated toilet seat or a handicapped toilet?: (Patient-Rptd) No  TIMED UP AND GO:  Was the test performed?  No    Cognitive Function:        03/31/2023    8:08 AM 12/12/2021    8:50 AM 06/07/2021    9:13 AM 07/20/2020    8:28 AM  6CIT Screen  What Year? 0 points 0 points 0 points 0 points  What month? 0 points 0 points 0 points 0 points  What time? 0 points 0 points 0 points 0 points  Count back from 20 0 points 0 points 0 points 0 points  Months in reverse 0 points 0 points 4 points 0 points  Repeat phrase 0 points 0 points 0 points 4 points  Total Score 0 points 0 points 4 points 4 points    Immunizations Immunization History  Administered Date(s) Administered   Tdap 02/04/2013    TDAP status: Due, Education has been provided regarding the importance of this vaccine. Advised may receive this vaccine at local pharmacy or Health Dept. Aware to provide a copy of the vaccination record if obtained from local pharmacy or Health Dept. Verbalized acceptance and understanding.  Flu Vaccine status: Declined, Education has been provided regarding the importance of this vaccine but patient still declined. Advised may receive this vaccine at local pharmacy or Health Dept. Aware to provide a copy of the vaccination record if obtained from local pharmacy or Health  Dept. Verbalized acceptance and understanding.  Pneumococcal vaccine status: Declined,  Education has been provided regarding the importance of this vaccine but patient still declined. Advised may receive this vaccine at local pharmacy or Health Dept. Aware to provide a copy of the vaccination record if obtained from local pharmacy or Health Dept. Verbalized acceptance and understanding.   Covid-19 vaccine status: Declined, Education has been provided regarding the importance of this vaccine but patient still declined. Advised may receive this vaccine at local pharmacy or Health Dept.or vaccine clinic. Aware to provide a copy of the vaccination record if obtained from local pharmacy or Health Dept. Verbalized acceptance and understanding.  Qualifies for Shingles Vaccine? Yes   Zostavax completed No   Shingrix Completed?: No.    Education has been provided regarding the importance of this vaccine. Patient has been advised to call insurance company to determine out of pocket expense if they have not yet received this vaccine. Advised may also receive vaccine at local pharmacy or Health Dept. Verbalized  acceptance and understanding.  Screening Tests Health Maintenance  Topic Date Due   Pneumonia Vaccine 66+ Years old (1 of 2 - PCV) Never done   Zoster Vaccines- Shingrix (1 of 2) Never done   INFLUENZA VACCINE  Never done   COVID-19 Vaccine (1 - 2024-25 season) Never done   DTaP/Tdap/Td (2 - Td or Tdap) 09/08/2023 (Originally 02/05/2023)   Lung Cancer Screening  09/27/2023   Medicare Annual Wellness (AWV)  03/30/2024   Colonoscopy  05/11/2026   Hepatitis C Screening  Completed   HPV VACCINES  Aged Out    Health Maintenance  Health Maintenance Due  Topic Date Due   Pneumonia Vaccine 43+ Years old (1 of 2 - PCV) Never done   Zoster Vaccines- Shingrix (1 of 2) Never done   INFLUENZA VACCINE  Never done   COVID-19 Vaccine (1 - 2024-25 season) Never done    Colorectal cancer screening: Type  of screening: Colonoscopy. Completed 05/11/2019. Repeat every 7 years  Lung Cancer Screening: (Low Dose CT Chest recommended if Age 24-80 years, 20 pack-year currently smoking OR have quit w/in 15years.) does qualify.   Lung Cancer Screening Referral: 10/09/2022  Additional Screening:  Hepatitis C Screening: does qualify; Completed 06/17/2016  Vision Screening: Recommended annual ophthalmology exams for early detection of glaucoma and other disorders of the eye. Is the patient up to date with their annual eye exam?  Yes  Who is the provider or what [facility] the [facility]  If pt is not established with a provider, would they like to be referred to a provider to establish care? No .   Dental Screening: Recommended annual dental exams for proper oral hygiene   Community Resource Referral / Chronic Care Management: CRR required this visit?  No   CCM required this visit?  No     Plan:     I have personally reviewed and noted the following in the patient's chart:   Medical and social history Use of alcohol, tobacco or illicit drugs  Current medications and supplements including opioid prescriptions. Patient is not currently taking opioid prescriptions. Functional ability and status Nutritional status Physical activity Advanced directives List of other physicians Hospitalizations, surgeries, and ER visits in previous 12 months Vitals Screenings to include cognitive, depression, and falls Referrals and appointments  In addition, I have reviewed and discussed with patient certain preventive protocols, quality metrics, and best practice recommendations. A written personalized care plan for preventive services as well as general preventive health recommendations were provided to patient.     Grant [name], CMA   03/31/2023   After Visit Summary: (MyChart) Due to this being a telephonic visit, the after visit summary  with patients personalized plan was offered to patient via MyChart   Nurse Notes: Patient is due for a Tdap and would like to get that during his next office visit with Grant [name].  He declines all other vaccines.  Patient is up to date with all other health maintenance with no concerns to address today.

## 2023-03-31 NOTE — Progress Notes (Signed)
@Patient  ID: Grant [name], male    DOB: [birth year], 68 y.o.   MRN: [id number]  Chief Complaint  Patient presents with   Follow-up    Referring provider: Elenore Paddy, NP  HPI: 68 year old male, former smoker followed for COPD/emphysema and very mild OSA not on CPAP. Past medical history significant for HTN, chronic sinusitis, Von Willlebrand disease, insomnia, RLS, neuropathy.   TEST/EVENTS:  02/19/2021 HST: AHI 7/h, SpO2 low 81% 05/23/2022 PFT: FVC 137, FEV1 148, ratio 81, TLC 137, DLCO 128 no BD 09/27/2022 LDCT chest lung cancer screen: Atherosclerosis.  No LAD.  Tiny left upper lobe nodule, 2 mm.  No other suspicious nodules.  No acute airspace disease.  Mild diffuse bronchial wall thickening with very mild centrilobular and paraseptal emphysema.  Lung RADS 2S. 02/12/2023 HST: AHI 5/h, SpO2 low 87%  12/02/2022: OV with Dr. Judeth Horn.  Followed for emphysema and DOE.  Overall doing well.  Continues to feel the Stiolto helps with breathing.  Still gets some short windedness but inhaler has been beneficial.  Has issues with sinus congestion and runny nose.  Present for many years.  Makes it difficult to use CPAP at times.  Seems to wax and wane throughout the year but triggered by seasonal changes, dusty environments at work etc.  Has tried antihistamines in the past but causes drowsiness and makes him feel bad.  Suspect that his DOE is related to smoking-related lung disease, emphysema and thickened bronchioles with signs of bronchitis.  PFTs with air trapping and hyperinflation without decrease in DLCO and no fixed obstructions.  Continue Stiolto as he receives benefit from use.  Shared decision making to continue with low-dose lung cancer screening CTs.  Lung RADS 2 09/2022.  Yearly CT chest reordered.  Advised to try Flonase nasal spray for sinus symptoms.  01/30/2023: OV with Shenicka Sunderlin NP for sleep consult to establish care.  He was previously followed by [facility] neurology and is wanting to  change his sleep apnea management over to [place].  He has a history of mild sleep apnea.  He does have a CPAP at home but does not always use it. Feels better with CPAP. He does think it helps him feel more rested and have better energy levels.  His biggest problem is he feels like his sinuses do get worse with CPAP therapy so he hasn't been using it much recently.  He also has issues with indigestion and gas that seems to get worse when he uses his CPAP.  He had previously asked to have his pressures adjusted but was told that he was well-controlled and did not need this.  Without CPAP, he does feel little more tired during the day.  He has been told in the past that he snores.  Denies any issues with drowsy driving or morning headaches.  No sleep parasomnia/paralysis.  Currently wearing a nasal pillow mask has not tried a fullface mask.  He does use Flonase nasal spray for his sinus symptoms which does help.  Breathing is doing well and is at his baseline. Goes to bed between 10 PM and midnight.  Falls asleep within 10 minutes to an hour.  Some nights he does not wake up and other times he gets up 3-4 times a night.  Wakes up at 530 to 6:30 AM.  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  Weight has fluctuated 20 pounds over the last 2 years.  Last sleep study  was in 2022.  He is not sure of his current CPAP settings.  He did not bring his machine with him.  He does think he has an SD card in it at home.  He will bring this back next week.  He is also interested in inspire device if he were to be a candidate.  Wants to know if he can repeat his sleep study.  Does not use any supplemental oxygen at home.  No sleep aids. He is also treated for restless leg and neuropathy by neurology.  They currently have him on Neupro, which seems to be working well for the most part. He has a history of hypertension, controlled on antihypertensives.  No history of stroke or diabetes. He is a former  smoker, quit in 2018.  He does smoke marijuana most days.  Does not drink any alcohol.  No excessive caffeine intake.  Lives with his wife.  Works as an Personnel officer.  Family history of emphysema, allergies, asthma and heart disease. Epworth 15  03/31/2023: Today - follow up Patient presents today for follow up after undergoing home sleep study, which revealed very mild OSA with AHI 5/h and apnea index 3.4/h. He is here to discuss results. He's had trouble tolerating CPAP due to sinuses. He has also felt like his pressures needed to be adjusting. He denies any drowsy driving or sleep parasomnias/paralysis. He does not wish to continue CPAP if he does not have to. Regarding his breathing, it's been stable. Has not required any steroids or abx. Occasional cough with minimal phlegm. No chest congestion or wheezing.   Allergies  Allergen Reactions   Bee Venom Anaphylaxis, Swelling and Rash   Asa [Aspirin] Other (See Comments)    Gi upset   Atorvastatin Other (See Comments)    Joint pain   Duloxetine Other (See Comments)    GI upset   Zetia [Ezetimibe] Other (See Comments)    Joint pain    Immunization History  Administered Date(s) Administered   Tdap 02/04/2013    Past Medical History:  Diagnosis Date   Acid reflux    Allergy    Anemia    Anxiety    Basal cell carcinoma (BCC) of nasal tip    History of hiatal hernia    Hypertension    Insomnia    RLS (restless legs syndrome)    Sensorineural hearing loss    Von Willebrand disease (HCC)    PT STATES THIS IS MILD AND HAS NEVER HAD TO SEE HEMATOLOGIST-PT DID SAY THAT WHEN HE WAS CIRCUMC[place] IN [place] YE[place] AGO HE BLED ALOT BUT IT WAS DUE TO NOT FOLLOWING POST OP INSTRUCTIONS     Tobacco History: Social History   Tobacco Use  Smoking Status Former   Current packs/day: 0.00   Average packs/day: 0.5 packs/day for 40.0 years (20.0 ttl pk-yrs)   Types: Cigarettes   Start date: 05/10/1976   Quit date: 05/10/2016   Years since  quitting: 6.8  Smokeless Tobacco Never   Counseling given: Not Answered   Outpatient Medications Prior to Visit  Medication Sig Dispense Refill   albuterol (VENTOLIN HFA) 108 (90 Base) MCG/ACT inhaler Inhale 2 puffs into the lungs every 6 (six) hours as needed for wheezing or shortness of breath. 8 g 0   amLODipine (NORVASC) 5 MG tablet TAKE 1 TABLET DAILY (FOLLOW UP DUE IN AUGUST WITH LABS) 90 tablet 3   celecoxib (CELEBREX) 200 MG capsule Take 1 capsule (200 mg total) by mouth 2 (  two) times daily.     diclofenac Sodium (VOLTAREN) 1 % GEL APPLY 2 GM TOPICALLY FOUR TIMES A DAY AS NEEDED 400 g 3   EPINEPHrine 0.3 mg/0.3 mL IJ SOAJ injection INJECT 0.3 ML (0.3 MG) INTO THE MUSCLE ONCE FOR 1 DOSE 2 each 3   fluticasone (FLONASE) 50 MCG/ACT nasal spray Place 1 spray into both nostrils daily. 16 g 5   lidocaine-prilocaine (EMLA) cream 1 gram qid prn 30 g 11   losartan (COZAAR) 50 MG tablet TAKE 1 TABLET DAILY 90 tablet 3   pantoprazole (PROTONIX) 40 MG tablet TAKE 1 TABLET TWICE A DAY 180 tablet 1   rosuvastatin (CRESTOR) 5 MG tablet Take 1 tablet (5 mg total) by mouth daily. 90 tablet 3   Rotigotine (NEUPRO) 1 MG/24HR PT24 Place 1 patch (1 mg total) onto the skin daily at 4 PM. 90 patch 1   STIOLTO RESPIMAT 2.5-2.5 MCG/ACT AERS USE 2 INHALATIONS DAILY 12 g 3   triamcinolone cream (KENALOG) 0.1 % Apply 1 Application topically 2 (two) times daily. 15 g 2   No facility-administered medications prior to visit.     Review of Systems:   Constitutional: No weight loss or gain, night sweats, fevers, chills. +fatigue HEENT: No headaches, difficulty swallowing, tooth/dental problems, or sore throat. No sneezing, itching, ear ache +nasal congestion, post nasal drip CV:  No chest pain, orthopnea, PND, swelling in lower extremities, anasarca, dizziness, palpitations, syncope Resp: +snoring, baseline shortness of breath with exertion, occasional cough (Baseline). No excess mucus or change in color of  mucus. No hemoptysis. No wheezing.  No chest wall deformity GI:  +indigestion, gas. No abdominal pain, nausea, vomiting, diarrhea, change in bowel habits, loss of appetite, bloody stools.  GU: No dysuria, change in color of urine, urgency or frequency.  Skin: No rash, lesions, ulcerations MSK:  No joint pain or swelling.   Neuro: No dizziness or lightheadedness.  Psych: No depression or anxiety. Mood stable. +sleep disturbance (improved with Neupro)    Physical Exam:  BP (!) 130/58 (BP Location: Right Arm, Patient Position: Sitting, Cuff Size: Normal)   Pulse 78   Ht 5\' 6"  (1.676 m)   Wt 183 lb 12.8 oz (83.4 kg)   SpO2 95%   BMI 29.67 kg/m   GEN: Pleasant, interactive, well-kempt; in no acute distress HEENT:  Normocephalic and atraumatic. PERRLA. Sclera white. Nasal turbinates pink, moist and patent bilaterally. No rhinorrhea present. Oropharynx pink and moist, without exudate or edema. No lesions, ulcerations, or postnasal drip. Mallampati III NECK:  Supple w/ fair ROM. No JVD present. Normal carotid impulses w/o bruits. Thyroid symmetrical with no goiter or nodules palpated. No lymphadenopathy.   CV: RRR, no m/r/g, no peripheral edema. Pulses intact, +2 bilaterally. No cyanosis, pallor or clubbing. PULMONARY:  Unlabored, regular breathing. Clear bilaterally A&P w/o wheezes/rales/rhonchi. No accessory muscle use.  GI: BS present and normoactive. Soft, non-tender to palpation. No organomegaly or masses detected.  MSK: No erythema, warmth or tenderness. Cap refil <2 sec all extrem. No deformities or joint swelling noted.  Neuro: A/Ox3. No focal deficits noted.   Skin: Warm, no lesions or rashe Psych: Normal affect and behavior. Judgement and thought content appropriate.     Lab Results:  CBC    Component Value Date/Time   WBC 6.0 06/07/2021 0934   RBC 4.64 06/07/2021 0934   HGB 14.3 06/07/2021 0934   HCT 42.3 06/07/2021 0934   PLT 253.0 06/07/2021 0934   MCV 91.3 06/07/2021  0934  MCV 91.0 11/16/2013 1002   MCH 30.3 06/16/2020 1236   MCHC 33.8 06/07/2021 0934   RDW 13.7 06/07/2021 0934   LYMPHSABS 1,699 06/16/2020 1236   MONOABS 0.7 11/08/2016 2128   EOSABS 252 06/16/2020 1236   BASOSABS 50 06/16/2020 1236    BMET    Component Value Date/Time   NA 138 11/07/2022 0837   NA 142 07/03/2021 1535   K 4.3 11/07/2022 0837   CL 105 11/07/2022 0837   CO2 26 11/07/2022 0837   GLUCOSE 100 (H) 11/07/2022 0837   BUN 17 11/07/2022 0837   BUN 14 07/03/2021 1535   CREATININE 0.90 11/07/2022 0837   CREATININE 0.88 03/26/2021 1106   CALCIUM 9.4 11/07/2022 0837   GFRNONAA 83 10/25/2019 0817   GFRAA 96 10/25/2019 0817    BNP No results found for: "BNP"   Imaging:  No results found.  Administration History     None          Latest Ref Rng & Units 05/23/2022    8:06 AM  PFT Results  FVC-Pre L 5.29   FVC-Predicted Pre % 137   FVC-Post L 5.23   FVC-Predicted Post % 136   Pre FEV1/FVC % % 79   Post FEV1/FCV % % 81   FEV1-Pre L 4.20   FEV1-Predicted Pre % 148   FEV1-Post L 4.22   DLCO uncorrected ml/min/mmHg 29.69   DLCO UNC% % 128   DLCO corrected ml/min/mmHg 29.69   DLCO COR %Predicted % 128   DLVA Predicted % 99   TLC L 8.57   TLC % Predicted % 137   RV % Predicted % 142     No results found for: "NITRICOXIDE"      Assessment & Plan:   OSA on CPAP Very mild OSA with AHI 5/h and apnea index 3.4/h. Minimal cardiovascular risk given borderline/mild severity. Reviewed treatment options. He prefers to discontinue CPAP. He is not interested in an oral appliance at this point. He will monitor symptoms and notify of any worsening or if he changes his mind at any point. Encouraged to work on exercise and healthy weight loss measures. Safe driving practices reviewed.   Patient Instructions  Ok to stop CPAP if you would prefer. Your sleep study showed very, very mild sleep apnea. You only had 5 events on average an hour, which is just right  at the threshold for diagnosing sleep apnea. This does not put much burden on your cardiovascular system and the overall risks are minimal. Recommend you continue elevating the head of your bed at night and monitor symptoms  We also briefly reviewed treatment options including weight loss, side sleeping position, oral appliance, CPAP therapy    Continue Stiolto 2 puffs daily Continue Albuterol inhaler 2 puffs every 6 hours as needed for shortness of breath or wheezing. Notify if symptoms persist despite rescue inhaler/neb use.  Continue flonase nasal spray 2 sprays each nostril daily   Follow up in 6 months with Dr. Judeth Horn. If symptoms do not improve or worsen, please contact office for sooner follow up or seek emergency care.    Centrilobular emphysema (HCC) Compensated on current regimen. Encouraged to remain active. Action plan in place.   Chronic sinusitis Stable. Continue current regimen. Follow up with ENT  Restless leg syndrome Controlled with current regimen. Follow up with neurology as scheduled.    Advised if symptoms do not improve or worsen, to please contact office for sooner follow up or seek emergency care.  I spent 28 minutes of dedicated to the care of this patient on the date of this encounter to include pre-visit review of records, face-to-face time with the patient discussing conditions above, post visit ordering of testing, clinical documentation with the electronic health record, making appropriate referrals as documented, and communicating necessary findings to members of the patients care team.  Noemi Chapel, NP 03/31/2023  Pt aware and understands NP's role.

## 2023-03-31 NOTE — Assessment & Plan Note (Signed)
Controlled with current regimen. Follow up with neurology as scheduled.

## 2023-03-31 NOTE — Assessment & Plan Note (Signed)
Stable. Continue current regimen. Follow up with ENT

## 2023-03-31 NOTE — Assessment & Plan Note (Signed)
Very mild OSA with AHI 5/h and apnea index 3.4/h. Minimal cardiovascular risk given borderline/mild severity. Reviewed treatment options. He prefers to discontinue CPAP. He is not interested in an oral appliance at this point. He will monitor symptoms and notify of any worsening or if he changes his mind at any point. Encouraged to work on exercise and healthy weight loss measures. Safe driving practices reviewed.   Patient Instructions  Ok to stop CPAP if you would prefer. Your sleep study showed very, very mild sleep apnea. You only had 5 events on average an hour, which is just right at the threshold for diagnosing sleep apnea. This does not put much burden on your cardiovascular system and the overall risks are minimal. Recommend you continue elevating the head of your bed at night and monitor symptoms  We also briefly reviewed treatment options including weight loss, side sleeping position, oral appliance, CPAP therapy    Continue Stiolto 2 puffs daily Continue Albuterol inhaler 2 puffs every 6 hours as needed for shortness of breath or wheezing. Notify if symptoms persist despite rescue inhaler/neb use.  Continue flonase nasal spray 2 sprays each nostril daily   Follow up in 6 months with Dr. Judeth Horn. If symptoms do not improve or worsen, please contact office for sooner follow up or seek emergency care.

## 2023-03-31 NOTE — Patient Instructions (Addendum)
Ok to stop CPAP if you would prefer. Your sleep study showed very, very mild sleep apnea. You only had 5 events on average an hour, which is just right at the threshold for diagnosing sleep apnea. This does not put much burden on your cardiovascular system and the overall risks are minimal. Recommend you continue elevating the head of your bed at night and monitor symptoms  We also briefly reviewed treatment options including weight loss, side sleeping position, oral appliance, CPAP therapy    Continue Stiolto 2 puffs daily Continue Albuterol inhaler 2 puffs every 6 hours as needed for shortness of breath or wheezing. Notify if symptoms persist despite rescue inhaler/neb use.  Continue flonase nasal spray 2 sprays each nostril daily   Follow up in 6 months with Dr. Judeth Horn. If symptoms do not improve or worsen, please contact office for sooner follow up or seek emergency care.

## 2023-03-31 NOTE — Patient Instructions (Signed)
Mr. Stilson , Thank you for taking time to come for your Medicare Wellness Visit. I appreciate your ongoing commitment to your health goals. Please review the following plan we discussed and let me know if I can assist you in the future.   Referrals/Orders/Follow-Ups/Clinician Recommendations: You are due for a Tetanus vaccine.  It was nice talking to you today and keep up the good work.   This is a list of the screening recommended for you and due dates:  Health Maintenance  Topic Date Due   Pneumonia Vaccine (1 of 2 - PCV) Never done   Zoster (Shingles) Vaccine (1 of 2) Never done   Flu Shot  Never done   COVID-19 Vaccine (1 - 2024-25 season) Never done   DTaP/Tdap/Td vaccine (2 - Td or Tdap) 02/05/2023   Screening for Lung Cancer  09/27/2023   Medicare Annual Wellness Visit  03/30/2024   Colon Cancer Screening  05/11/2026   Hepatitis C Screening  Completed   HPV Vaccine  Aged Out    Advanced directives: (Copy Requested) Please bring a copy of your health care power of attorney and living will to the office to be added to your chart at your convenience.  Next Medicare Annual Wellness Visit scheduled for next year: Yes

## 2023-03-31 NOTE — Assessment & Plan Note (Signed)
Compensated on current regimen. Encouraged to remain active. Action plan in place.

## 2023-04-09 ENCOUNTER — Telehealth (INDEPENDENT_AMBULATORY_CARE_PROVIDER_SITE_OTHER): Payer: Self-pay | Admitting: Otolaryngology

## 2023-04-09 NOTE — Telephone Encounter (Signed)
Called to confirm appt & location for 04/10/2023 appt.  Patient stated that he has been ill for the last few days, but if he is feeling better, he will come to the appointment.  If he is not, patient stated that he will call to r/s by EOB today.

## 2023-04-10 ENCOUNTER — Institutional Professional Consult (permissible substitution) (INDEPENDENT_AMBULATORY_CARE_PROVIDER_SITE_OTHER): Payer: Medicare Other

## 2023-04-15 ENCOUNTER — Encounter: Payer: Self-pay | Admitting: Cardiovascular Disease

## 2023-04-15 ENCOUNTER — Ambulatory Visit: Payer: Medicare Other | Attending: Cardiovascular Disease | Admitting: Cardiovascular Disease

## 2023-04-15 ENCOUNTER — Other Ambulatory Visit: Payer: Self-pay | Admitting: Internal Medicine

## 2023-04-15 VITALS — BP 124/0 | HR 63 | Ht 66.0 in | Wt 175.8 lb

## 2023-04-15 DIAGNOSIS — I700 Atherosclerosis of aorta: Secondary | ICD-10-CM | POA: Diagnosis present

## 2023-04-15 DIAGNOSIS — R0989 Other specified symptoms and signs involving the circulatory and respiratory systems: Secondary | ICD-10-CM | POA: Insufficient documentation

## 2023-04-15 DIAGNOSIS — E785 Hyperlipidemia, unspecified: Secondary | ICD-10-CM | POA: Insufficient documentation

## 2023-04-15 DIAGNOSIS — R931 Abnormal findings on diagnostic imaging of heart and coronary circulation: Secondary | ICD-10-CM | POA: Insufficient documentation

## 2023-04-15 NOTE — Progress Notes (Signed)
 Cardiology Office Note   Date:  04/15/2023   ID:  [name], Trim 06-20-1955, MRN [id number]  PCP:  Elnor Lauraine BRAVO, NP  Cardiologist:   Deatrice Cage, MD   Chief Complaint  Patient presents with   Follow-up    Patient denies new or acute cardiac problems/concerns today.        History of Present Illness: Grant [name] is a 68 y.o. male who is here today for follow-up visit regarding coronary artery disease and hyperlipidemia.  He has known history of essential hypertension, hyperlipidemia with intolerance to statins, previous tobacco use, COPD, GERD and sleep apnea on CPAP.  He had CT scan of the lungs  which showed evidence of three-vessel coronary artery calcifications as well as aortic calcifications.CT scan of the abdomen in 2020 showed no evidence of abdominal aortic aneurysm.  He quit smoking in 2018 but uses marijuana on a regular basis. He underwent cardiac CTA in February of 2023 which showed a calcium  score of 122.  There was mild to moderate LAD disease .  CT FFR was negative. Carotid Doppler showed normal carotid arteries.  He had some palpitations and thus had 2-week ZIO monitor which showed sinus rhythm with average heart rate of 61 bpm.  He had short runs of SVT the longest lasted only 7 beats.    He has been doing well with no chest pain, shortness of breath or palpitations.  Past Medical History:  Diagnosis Date   Acid reflux    Allergy    Anemia    Anxiety    Basal cell carcinoma (BCC) of nasal tip    History of hiatal hernia    Hypertension    Insomnia    RLS (restless legs syndrome)    Sensorineural hearing loss    Von Willebrand disease (HCC)    PT STATES THIS IS MILD AND HAS NEVER HAD TO SEE HEMATOLOGIST-PT DID SAY THAT WHEN [place] AGO HE BLED ALOT BUT IT WAS DUE TO NOT FOLLOWING POST OP INSTRUCTIONS     Past Surgical History:  Procedure Laterality Date   CIRCUMCISION     AS AN ADULT   COLONOSCOPY     FOOT SURGERY  Right    arch support   HERNIA REPAIR     INCISION AND DRAINAGE Right 02/04/2013   Procedure: INCISION AND DRAINAGE;  Surgeon: Prentice LELON Pagan, MD;  Location: [facility];  Service: Orthopedics;  Laterality: Right;   INGUINAL HERNIA REPAIR Bilateral 05/13/2018   Procedure: LAPAROSCOPIC BILATERAL INGUINAL HERNIA REPAIR;  Surgeon: Desiderio Schanz, MD;  Location: [facility] ORS;  Service: General;  Laterality: Bilateral;   INGUINAL HERNIA REPAIR Right 06/17/2019   Procedure: HERNIA REPAIR INGUINAL ADULT WITH MESH-- Recurrent;  Surgeon: Desiderio Schanz, MD;  Location: [facility] OR[facility];  Service: General;  Laterality: Right;   OPEN REDUCTION INTERNAL FIXATION (ORIF) FINGER WITH RADIAL BONE GRAFT Right 02/04/2013   Procedure: OPEN REDUCTION INTERNAL FIXATION (ORIF) right ring and small fingers;  Surgeon: Prentice LELON Pagan, MD;  Location: [facility];  Service: Orthopedics;  Laterality: Right;     Current Outpatient Medications  Medication Sig Dispense Refill   albuterol  (VENTOLIN  HFA) 108 (90 Base) MCG/ACT inhaler Inhale 2 puffs into the lungs every 6 (six) hours as needed for wheezing or shortness of breath. 8 g 0   amLODipine  (NORVASC ) 5 MG tablet TAKE 1 TABLET DAILY (FOLLOW UP DUE IN AUGUST WITH LABS) 90 tablet 3   celecoxib  (CELEBREX )  200 MG capsule Take 1 capsule (200 mg total) by mouth 2 (two) times daily.     diclofenac  Sodium (VOLTAREN ) 1 % GEL APPLY 2 GM TOPICALLY FOUR TIMES A DAY AS NEEDED 400 g 3   EPINEPHrine  0.3 mg/0.3 mL IJ SOAJ injection INJECT 0.3 ML (0.3 MG) INTO THE MUSCLE ONCE FOR 1 DOSE 2 each 3   fluticasone  (FLONASE ) 50 MCG/ACT nasal spray Place 1 spray into both nostrils daily. 16 g 5   lidocaine -prilocaine  (EMLA ) cream 1 gram qid prn 30 g 11   losartan  (COZAAR ) 50 MG tablet TAKE 1 TABLET DAILY 90 tablet 3   pantoprazole  (PROTONIX ) 40 MG tablet TAKE 1 TABLET TWICE A DAY 180 tablet 1   rosuvastatin  (CRESTOR ) 5 MG tablet Take 1 tablet (5 mg total) by mouth daily. 90 tablet 3   Rotigotine  (NEUPRO ) 1 MG/24HR PT24  Place 1 patch (1 mg total) onto the skin daily at 4 PM. 90 patch 1   STIOLTO RESPIMAT  2.5-2.5 MCG/ACT AERS USE 2 INHALATIONS DAILY 12 g 3   triamcinolone  cream (KENALOG ) 0.1 % Apply 1 Application topically 2 (two) times daily. 15 g 2   No current facility-administered medications for this visit.    Allergies:   Bee venom, Asa [aspirin], Atorvastatin , Duloxetine , and Zetia  Cole.colony ]    Social History:  The patient  reports that he quit smoking about 6 years ago. His smoking use included cigarettes. He started smoking about 46 years ago. He has a 20 pack-year smoking history. He has never used smokeless tobacco. He reports current drug use. Drug: Marijuana. He reports that he does not drink alcohol.   Family History:  The patient's family history includes Hyperlipidemia in his father and mother; Hypertension in his father and mother; Stroke in his father.    ROS:  Please see the history of present illness.   Otherwise, review of systems are positive for none.   All other systems are reviewed and negative.    PHYSICAL EXAM: VS:  BP (!) 124/0 (BP Location: Left Arm, Patient Position: Sitting, Cuff Size: Normal)   Pulse 63   Ht 5' 6 (1.676 m)   Wt 175 lb 12.8 oz (79.7 kg)   SpO2 96%   BMI 28.37 kg/m  , BMI Body mass index is 28.37 kg/m. GEN: Well nourished, well developed, in no acute distress  HEENT: normal  Neck: no JVD,  or masses.  Right carotid bruit. Cardiac: RRR; no murmurs, rubs, or gallops,no edema  Respiratory:  clear to auscultation bilaterally, normal work of breathing GI: soft, nontender, nondistended, + BS MS: no deformity or atrophy  Skin: warm and dry, no rash Neuro:  Strength and sensation are intact Psych: euthymic mood, full affect    EKG:  EKG is ordered today. EKG showed: Normal sinus rhythm Nonspecific ST abnormality       Recent Labs: 11/07/2022: ALT 33; BUN 17; Creatinine, Ser 0.90; Potassium 4.3; Sodium 138    Lipid Panel    Component Value  Date/Time   CHOL 103 11/07/2022 0837   CHOL 152 04/24/2021 0744   TRIG 43.0 11/07/2022 0837   HDL 43.10 11/07/2022 0837   HDL 43 04/24/2021 0744   CHOLHDL 2 11/07/2022 0837   VLDL 8.6 11/07/2022 0837   LDLCALC 51 11/07/2022 0837   LDLCALC 97 04/24/2021 0744   LDLCALC 112 (H) 10/25/2019 0817      Wt Readings from Last 3 Encounters:  04/15/23 175 lb 12.8 oz (79.7 kg)  03/31/23 183 lb 12.8  oz (83.4 kg)  03/31/23 180 lb (81.6 kg)          No data to display            ASSESSMENT AND PLAN:  1.  Mild nonobstructive coronary artery disease: Currently with no anginal symptoms.  Continue medical therapy.  Continue treatment of risk factors.  He can follow-up with me as needed.  2.  Right carotid bruit: Normal carotid arteries by Doppler.  3.  Aortic atherosclerosis, no claudication.  4.  Hyperlipidemia: He has been tolerating small dose rosuvastatin .  Most recent lipid profile showed an LDL of 51.  5.  Palpitations: Outpatient monitor showed only short runs of SVT.  No recurrent symptoms.   Disposition: He has been stable from a cardiac standpoint and can follow-up with us  as needed.  Signed,  Deatrice Cage, MD  04/15/2023 3:19 PM    [facility]

## 2023-04-15 NOTE — Patient Instructions (Signed)
 Medication Instructions:  No changes *If you need a refill on your cardiac medications before your next appointment, please call your pharmacy*   Lab Work: None ordered If you have labs (blood work) drawn today and your tests are completely normal, you will receive your results only by: MyChart Message (if you have MyChart) OR A paper copy in the mail If you have any lab test that is abnormal or we need to change your treatment, we will call you to review the results.   Testing/Procedures: None ordered   Follow-Up: At [facility], you and your health needs are our priority.  As part of our continuing mission to provide you with exceptional heart care, we have created designated Provider Care Teams.  These Care Teams include your primary Cardiologist (physician) and Advanced Practice Providers (APPs -  Physician Assistants and Nurse Practitioners) who all work together to provide you with the care you need, when you need it.  We recommend signing up for the patient portal called "MyChart".  Sign up information is provided on this After Visit Summary.  MyChart is used to connect with patients for Virtual Visits (Telemedicine).  Patients are able to view lab/test results, encounter notes, upcoming appointments, etc.  Non-urgent messages can be sent to your provider as well.   To learn more about what you can do with MyChart, go to [email or web address].    Your next appointment:   Follow up as needed

## 2023-04-25 ENCOUNTER — Ambulatory Visit: Payer: Medicare Other | Admitting: Neurology

## 2023-05-29 ENCOUNTER — Telehealth (INDEPENDENT_AMBULATORY_CARE_PROVIDER_SITE_OTHER): Payer: Self-pay | Admitting: Otolaryngology

## 2023-05-29 NOTE — Telephone Encounter (Signed)
 Confirmed appt date and location with patient for 05/30/2023.

## 2023-05-30 ENCOUNTER — Encounter (INDEPENDENT_AMBULATORY_CARE_PROVIDER_SITE_OTHER): Payer: Self-pay

## 2023-05-30 ENCOUNTER — Ambulatory Visit (INDEPENDENT_AMBULATORY_CARE_PROVIDER_SITE_OTHER): Payer: Medicare Other | Admitting: Otolaryngology

## 2023-05-30 VITALS — BP 143/73 | HR 58 | Ht 66.0 in | Wt 165.0 lb

## 2023-05-30 DIAGNOSIS — R0981 Nasal congestion: Secondary | ICD-10-CM | POA: Diagnosis not present

## 2023-05-30 DIAGNOSIS — J343 Hypertrophy of nasal turbinates: Secondary | ICD-10-CM

## 2023-05-30 DIAGNOSIS — J342 Deviated nasal septum: Secondary | ICD-10-CM

## 2023-05-30 DIAGNOSIS — J329 Chronic sinusitis, unspecified: Secondary | ICD-10-CM | POA: Diagnosis not present

## 2023-05-30 DIAGNOSIS — J324 Chronic pansinusitis: Secondary | ICD-10-CM

## 2023-06-02 DIAGNOSIS — J343 Hypertrophy of nasal turbinates: Secondary | ICD-10-CM | POA: Insufficient documentation

## 2023-06-02 DIAGNOSIS — J342 Deviated nasal septum: Secondary | ICD-10-CM | POA: Insufficient documentation

## 2023-06-02 NOTE — Progress Notes (Signed)
 Patient ID: Grant [name], male   DOB: 1955/11/07, 68 y.o.   MRN: [id number]  CC: Chronic nasal congestion, recurrent sinusitis  HPI:  Grant [name] is a 68 y.o. male who presents today complaining of chronic nasal congestion and recurrent sinusitis.  According to the patient, his previous CT scan showed sinonasal polyposis.  He has been using Flonase nasal spray daily for the past 5 months.  Despite the treatment, he continues to have frequent nasal obstruction.  He has no previous ENT surgery.  Past Medical History:  Diagnosis Date   Acid reflux    Allergy    Anemia    Anxiety    Basal cell carcinoma (BCC) of nasal tip    History of hiatal hernia    Hypertension    Insomnia    RLS (restless legs syndrome)    Sensorineural hearing loss    Von Willebrand disease (HCC)    PT STATES THIS IS MILD AND HAS NEVER HAD TO SEE HEMATOLOGIST-PT DID SAY THAT WHEN [place] HE BLED ALOT BUT IT WAS DUE TO NOT FOLLOWING POST OP INSTRUCTIONS     Past Surgical History:  Procedure Laterality Date   CIRCUMCISION     AS AN ADULT   COLONOSCOPY     FOOT SURGERY Right    arch support   HERNIA REPAIR     INCISION AND DRAINAGE Right 02/04/2013   Procedure: INCISION AND DRAINAGE;  Surgeon: Sharma Covert, MD;  Location: [facility];  Service: Orthopedics;  Laterality: Right;   INGUINAL HERNIA REPAIR Bilateral 05/13/2018   Procedure: LAPAROSCOPIC BILATERAL INGUINAL HERNIA REPAIR;  Surgeon: Henrene Dodge, MD;  Location: [facility] OR[facility];  Service: General;  Laterality: Bilateral;   INGUINAL HERNIA REPAIR Right 06/17/2019   Procedure: HERNIA REPAIR INGUINAL ADULT WITH MESH-- Recurrent;  Surgeon: Henrene Dodge, MD;  Location: [facility] OR[facility];  Service: General;  Laterality: Right;   OPEN REDUCTION INTERNAL FIXATION (ORIF) FINGER WITH RADIAL BONE GRAFT Right 02/04/2013   Procedure: OPEN REDUCTION INTERNAL FIXATION (ORIF) right ring and small fingers;  Surgeon: Sharma Covert, MD;  Location: [facility];   Service: Orthopedics;  Laterality: Right;    Family History  Problem Relation Age of Onset   Hypertension Mother    Hyperlipidemia Mother    Hypertension Father    Hyperlipidemia Father    Stroke Father        x2   Colon cancer Neg Hx    Esophageal cancer Neg Hx    Rectal cancer Neg Hx    Stomach cancer Neg Hx    Sleep apnea Neg Hx    Neuropathy Neg Hx    Pancreatic cancer Neg Hx     Social History:  reports that he quit smoking about 7 years ago. His smoking use included cigarettes. He started smoking about 47 years ago. He has a 20 pack-year smoking history. He has never used smokeless tobacco. He reports current drug use. Drug: Marijuana. He reports that he does not drink alcohol.  Allergies:  Allergies  Allergen Reactions   Bee Venom Anaphylaxis, Swelling and Rash   Asa [Aspirin] Other (See Comments)    Gi upset   Atorvastatin Other (See Comments)    Joint pain   Duloxetine Other (See Comments)    GI upset   Zetia [Ezetimibe] Other (See Comments)    Joint pain    Prior to Admission medications   Medication Sig Start Date End Date Taking? Authorizing Provider  albuterol (VENTOLIN HFA) 108 (90 Base) MCG/ACT inhaler Inhale 2 puffs into the lungs every 6 (six) hours as needed for wheezing or shortness of breath. 05/09/22  Yes Elenore Paddy, NP  amLODipine (NORVASC) 5 MG tablet TAKE 1 TABLET DAILY (FOLLOW UP DUE IN AUGUST WITH LABS) 01/30/23  Yes Worthy Rancher B, FNP  celecoxib (CELEBREX) 200 MG capsule Take 1 capsule (200 mg total) by mouth 2 (two) times daily. 11/07/22  Yes Elenore Paddy, NP  diclofenac Sodium (VOLTAREN) 1 % GEL APPLY 2 GM TOPICALLY FOUR TIMES A DAY AS NEEDED 08/13/22  Yes Naomie Dean B, MD  EPINEPHrine 0.3 mg/0.3 mL IJ SOAJ injection INJECT 0.3 ML (0.3 MG) INTO THE MUSCLE ONCE FOR 1 DOSE 09/10/22  Yes Elenore Paddy, NP  fluticasone (FLONASE) 50 MCG/ACT nasal spray Place 1 spray into both nostrils daily. 12/02/22  Yes Hunsucker, Lesia Sago, MD   lidocaine-prilocaine (EMLA) cream 1 gram qid prn 01/30/22  Yes Levert Feinstein, MD  losartan (COZAAR) 50 MG tablet TAKE 1 TABLET DAILY 09/13/22  Yes Elenore Paddy, NP  pantoprazole (PROTONIX) 40 MG tablet TAKE 1 TABLET TWICE A DAY 11/27/22  Yes Cirigliano, Vito V, DO  rosuvastatin (CRESTOR) 5 MG tablet TAKE 1 TABLET DAILY 04/16/23  Yes Iran Ouch, MD  Rotigotine (NEUPRO) 1 MG/24HR PT24 Place 1 patch (1 mg total) onto the skin daily at 4 PM. 01/22/23  Yes Lomax, Amy, NP  STIOLTO RESPIMAT 2.5-2.5 MCG/ACT AERS USE 2 INHALATIONS DAILY 03/24/23  Yes Hunsucker, Lesia Sago, MD  triamcinolone cream (KENALOG) 0.1 % Apply 1 Application topically 2 (two) times daily. 08/22/22  Yes Elenore Paddy, NP    Blood pressure (!) 143/73, pulse (!) 58, height 5\' 6"  (1.676 m), weight 165 lb (74.8 kg), SpO2 96%. Exam: General: Communicates without difficulty, well nourished, no acute distress. Head: Normocephalic, no evidence injury, no tenderness, facial buttresses intact without stepoff. Face/sinus: No tenderness to palpation and percussion. Facial movement is normal and symmetric. Eyes: PERRL, EOMI. No scleral icterus, conjunctivae clear. Neuro: CN II exam reveals vision grossly intact.  No nystagmus at any point of gaze. Ears: Auricles well formed without lesions.  Ear canals are intact without mass or lesion.  No erythema or edema is appreciated.  The TMs are intact without fluid. Nose: External evaluation reveals normal support and skin without lesions.  Dorsum is intact.  Anterior rhinoscopy reveals congested mucosa over anterior aspect of inferior turbinates and intact septum.  No purulence noted. Oral:  Oral cavity and oropharynx are intact, symmetric, without erythema or edema.  Mucosa is moist without lesions. Neck: Full range of motion without pain.  There is no significant lymphadenopathy.  No masses palpable.  Thyroid bed within normal limits to palpation.  Parotid glands and submandibular glands equal bilaterally  without mass.  Trachea is midline. Neuro:  CN 2-12 grossly intact.   Procedure:  Flexible Nasal Endoscopy: Description: Risks, benefits, and alternatives of flexible endoscopy were explained to the patient.  Specific mention was made of the risk of throat numbness with difficulty swallowing, possible bleeding from the nose and mouth, and pain from the procedure.  The patient gave oral consent to proceed.  The flexible scope was inserted into the right nasal cavity.  Endoscopy of the interior nasal cavity, superior, inferior, and middle meatus was performed. The sphenoid-ethmoid recess was examined. Edematous mucosa was noted.  No polyp, mass, or lesion was appreciated. Nasal septal deviation noted. Olfactory cleft was clear.  Nasopharynx was  clear.  Turbinates were hypertrophied but without mass.  The procedure was repeated on the contralateral side with similar findings.  The patient tolerated the procedure well.   Assessment: 1.  Chronic rhinitis with nasal mucosal congestion, nasal septal deviation, and bilateral inferior turbinate hypertrophy. 2.  History of recurrent rhinosinusitis.  However, no acute infection or purulent drainage is noted today.  Plan: 1.  The physical exam and nasal endoscopy findings are reviewed with the patient. 2.  Continue with Flonase nasal spray 2 sprays each nostril daily.  The importance of consistent daily use is discussed. 3.  Sinus CT scan to evaluate for possible chronic sinusitis. 4.  The patient will return for reevaluation after his sinus CT scan.  Grant [name] 06/02/2023, 8:17 AM

## 2023-06-12 ENCOUNTER — Ambulatory Visit (HOSPITAL_COMMUNITY)
Admission: RE | Admit: 2023-06-12 | Discharge: 2023-06-12 | Disposition: A | Discharge status: Home / Self Care | Source: Ambulatory Visit | Attending: Otolaryngology | Admitting: Otolaryngology

## 2023-06-12 DIAGNOSIS — J324 Chronic pansinusitis: Secondary | ICD-10-CM | POA: Diagnosis present

## 2023-06-18 ENCOUNTER — Encounter: Payer: Self-pay | Admitting: Pulmonary Disease

## 2023-06-18 ENCOUNTER — Ambulatory Visit: Payer: Medicare Other | Admitting: Pulmonary Disease

## 2023-06-18 VITALS — BP 132/76 | HR 77 | Ht 66.0 in | Wt 181.0 lb

## 2023-06-18 DIAGNOSIS — J324 Chronic pansinusitis: Secondary | ICD-10-CM | POA: Diagnosis not present

## 2023-06-18 DIAGNOSIS — J432 Centrilobular emphysema: Secondary | ICD-10-CM

## 2023-06-18 MED ORDER — STIOLTO RESPIMAT 2.5-2.5 MCG/ACT IN AERS
2.0000 | INHALATION_SPRAY | Freq: Every day | RESPIRATORY_TRACT | 4 refills | Status: AC
Start: 2023-06-18 — End: ?

## 2023-06-18 NOTE — Patient Instructions (Addendum)
 To see you again  No changes in medication, Stiolto refilled today  Please discuss with your primary care physician repeating the CT lung cancer screening in July 2025.  If they prefer to keep everything within the [place] they can do it there.  If they prefer for me to please submit a message and I can help arrange.  Return to clinic in 1 year or sooner as needed with Dr. Judeth Horn

## 2023-06-18 NOTE — Progress Notes (Signed)
 @Patient  ID: Grant [name], male    DOB: [birth year], 68 y.o.   MRN: [id number]  Chief Complaint  Patient presents with   Follow-up    Referring provider: Elenore Paddy, NP  HPI:   68 y.o. man whom we are seeing for evaluation of emphysema and dyspnea on exertion.  Most recent cardiology was note reviewed.  Most recent ENT note reviewed.  Overall doing well.  Continues to feel the Stiolto helps his breathing.  Saw ENT recently with chronic sinus issues. CT sinus pending. To continue flonase.  Increasing frequency.  Has helped.  Doing well.  Dyspnea stable and doing well.  Recent change in his disability will follow-up with the [place].  HPI initial visit: Patient notes dyspnea on exertion onset about 2 to 3 years ago.  Relatively mild.  With multiple flights of stairs or climb up and down ladders.  Works as Personnel officer.  Some days are better than others.  Not to the point he needs to stop and rest but certainly notices heavier breathing than he did before.  No time of day when things are better or worse.  No position makes these better worse.  No seasonal or environmental factors he can identify to make things better or worse.  Recently prescribed albuterol.  Uses not very often, not sure how much it helps.  Most recent lung imaging, CT coronary in 2/23 reveals bronchial thickening and mild emphysematous changes, otherwise clear on my review and interpretation.  Prior CT chest lung cancer screening 06/2020 personally reviewed and interpreted as mild emphysematous changes, hyperinflation, thickened bronchioles, mild bilateral lower lobe bronchiectasis.  PMH: Hypertension, hyperlipidemia, seasonal allergies Surgical history: Reviewed, he denies any Family history: History of emphysema, CAD and personal relatives Social history: 4060-pack-year history, quit in approximately 2016, smokes marijuana, lives in [place], [place] years in the [place], works as Market researcher / Pulmonary  Flowsheets:   ACT:      No data to display          MMRC:     No data to display          Epworth:      No data to display          Tests:   FENO:  No results found for: "NITRICOXIDE"  PFT:    Latest Ref Rng & Units 05/23/2022    8:06 AM  PFT Results  FVC-Pre L 5.29   FVC-Predicted Pre % 137   FVC-Post L 5.23   FVC-Predicted Post % 136   Pre FEV1/FVC % % 79   Post FEV1/FCV % % 81   FEV1-Pre L 4.20   FEV1-Predicted Pre % 148   FEV1-Post L 4.22   DLCO uncorrected ml/min/mmHg 29.69   DLCO UNC% % 128   DLCO corrected ml/min/mmHg 29.69   DLCO COR %Predicted % 128   DLVA Predicted % 99   TLC L 8.57   TLC % Predicted % 137   RV % Predicted % 142   Personally reviewed, interpreted as normal spirometry, no bronchodilator response, lung biopsy consistent with hyperinflation and air trapping, DLCO within normal limits  WALK:      No data to display          Imaging: Personally reviewed and as per EMR and discussion in this note No results found.  Lab Results: Personally reviewed CBC    Component Value Date/Time   WBC 6.0 06/07/2021 0934   RBC 4.64 06/07/2021 0934  HGB 14.3 06/07/2021 0934   HCT 42.3 06/07/2021 0934   PLT 253.0 06/07/2021 0934   MCV 91.3 06/07/2021 0934   MCV 91.0 11/16/2013 1002   MCH 30.3 06/16/2020 1236   MCHC 33.8 06/07/2021 0934   RDW 13.7 06/07/2021 0934   LYMPHSABS 1,699 06/16/2020 1236   MONOABS 0.7 11/08/2016 2128   EOSABS 252 06/16/2020 1236   BASOSABS 50 06/16/2020 1236    BMET    Component Value Date/Time   NA 138 11/07/2022 0837   NA 142 07/03/2021 1535   K 4.3 11/07/2022 0837   CL 105 11/07/2022 0837   CO2 26 11/07/2022 0837   GLUCOSE 100 (H) 11/07/2022 0837   BUN 17 11/07/2022 0837   BUN 14 07/03/2021 1535   CREATININE 0.90 11/07/2022 0837   CREATININE 0.88 03/26/2021 1106   CALCIUM 9.4 11/07/2022 0837   GFRNONAA 83 10/25/2019 0817   GFRAA 96 10/25/2019 0817    BNP No results found for:  "BNP"  ProBNP No results found for: "PROBNP"  Specialty Problems       Pulmonary Problems   Centrilobular emphysema (HCC)   Chronic pansinusitis   Chronic rhinitis   OSA on CPAP   Deviated nasal septum   Hypertrophy of nasal turbinates    Allergies  Allergen Reactions   Bee Venom Anaphylaxis, Swelling and Rash   Asa [Aspirin] Other (See Comments)    Gi upset   Atorvastatin Other (See Comments)    Joint pain   Duloxetine Other (See Comments)    GI upset   Zetia [Ezetimibe] Other (See Comments)    Joint pain    Immunization History  Administered Date(s) Administered   Tdap 02/04/2013    Past Medical History:  Diagnosis Date   Acid reflux    Allergy    Anemia    Anxiety    Basal cell carcinoma (BCC) of nasal tip    History of hiatal hernia    Hypertension    Insomnia    RLS (restless legs syndrome)    Sensorineural hearing loss    Von Willebrand disease (HCC)    PT STATES THIS IS MILD AND HAS NEVER HAD TO SEE HEMATOLOGIST-PT DID SAY THAT WHEN HE WAS CIRCUMCIS[place] IN THE [employer] YE[place] AGO HE BLED ALOT BUT IT WAS DUE TO NOT FOLLOWING POST OP INSTRUCTIONS     Tobacco History: Social History   Tobacco Use  Smoking Status Former   Current packs/day: 0.00   Average packs/day: 0.5 packs/day for 40.0 years (20.0 ttl pk-yrs)   Types: Cigarettes   Start date: 05/10/1976   Quit date: 05/10/2016   Years since quitting: 7.1  Smokeless Tobacco Never   Counseling given: Not Answered   Continue to not smoke  Outpatient Encounter Medications as of 69/11/2023  Medication Sig   albuterol (VENTOLIN HFA) 108 (90 Base) MCG/ACT inhaler Inhale 2 puffs into the lungs every 6 (six) hours as needed for wheezing or shortness of breath.   amLODipine (NORVASC) 5 MG tablet TAKE 1 TABLET DAILY (FOLLOW UP DUE IN AUGUST WITH LABS)   diclofenac Sodium (VOLTAREN) 1 % GEL APPLY 2 GM TOPICALLY FOUR TIMES A DAY AS NEEDED   EPINEPHrine 0.3 mg/0.3 mL IJ SOAJ injection INJECT 0.3 ML (0.3 MG) INTO  THE MUSCLE ONCE FOR 1 DOSE   fluticasone (FLONASE) 50 MCG/ACT nasal spray Place 1 spray into both nostrils daily.   lidocaine-prilocaine (EMLA) cream 1 gram qid prn   losartan (COZAAR) 50 MG tablet TAKE 1 TABLET DAILY  pantoprazole (PROTONIX) 40 MG tablet TAKE 1 TABLET TWICE A DAY   rosuvastatin (CRESTOR) 5 MG tablet TAKE 1 TABLET DAILY   Rotigotine (NEUPRO) 1 MG/24HR PT24 Place 1 patch (1 mg total) onto the skin daily at 4 PM.   triamcinolone cream (KENALOG) 0.1 % Apply 1 Application topically 2 (two) times daily.   [DISCONTINUED] STIOLTO RESPIMAT 2.5-2.5 MCG/ACT AERS USE 2 INHALATIONS DAILY   celecoxib (CELEBREX) 200 MG capsule Take 1 capsule (200 mg total) by mouth 2 (two) times daily.   Tiotropium Bromide-Olodaterol (STIOLTO RESPIMAT) 2.5-2.5 MCG/ACT AERS Inhale 2 puffs into the lungs daily.   No facility-administered encounter medications on file as of 06/18/2023.     Review of Systems  Review of Systems  N/a Physical Exam  BP 132/76 (BP Location: Left Arm, Patient Position: Sitting, Cuff Size: Normal)   Pulse 77   Ht 5\' 6"  (1.676 m)   Wt 181 lb (82.1 kg)   SpO2 97%   BMI 29.21 kg/m   Wt Readings from Last 5 Encounters:  06/18/23 181 lb (82.1 kg)  05/30/23 165 lb (74.8 kg)  04/15/23 175 lb 12.8 oz (79.7 kg)  03/31/23 183 lb 12.8 oz (83.4 kg)  03/31/23 180 lb (81.6 kg)    BMI Readings from Last 5 Encounters:  06/18/23 29.21 kg/m  05/30/23 26.63 kg/m  04/15/23 28.37 kg/m  03/31/23 29.67 kg/m  03/31/23 29.05 kg/m     Physical Exam General: Sitting in chair, no acute distress Eyes: EOMI, no icterus Neck: Supple no JVP Pulmonary clear, normal work of breathing Cardiovascular: Warm, no edema Abdomen: Nondistended bowel sounds present MSK: No synovitis, no joint effusion Neuro: Normal gait, no Psych: Normal mood, full affect  Assessment & Plan:   Dyspnea on exertion: Suspect related to smoking-related lung disease, emphysema, thickened bronchioles/signs  of chronic bronchitis as well as clinical chronic bronchitis, daily cough.  PFTs reveal air trapping and hyperinflation without decrease in DLCO, no fixed obstruction.  Symptoms improved with Stiolto.  To continue, refilled today.  Tobacco abuse in remission: Quit in 2018.  20-pack-year history.  Had lung cancer CT 06/2020 recommended 1 year follow-up, none since.  Using a screening age, should have [email or web address], patient is at elevated risk of developing lung cancer in the next 6 years.  Reviewed risk and benefits of lung cancer screening.  After shared decision-making he agrees to continue in lung cancer screening.  Lung RADS 2 09/2022, yearly CT scan recommended.  He prefers to get this done at the [place], I encouraged him to discuss follow-up and arrange at the [place].  Chronic sinusitis/rhinitis: Problem for many years.  Flares with change in seasons as well as environmental changes at job sites etc. recently seen by ENT, told had deviated septum.  Increase Flonase to 2 sprays twice a day.  This helped some.  Received CT sinus in the interim, awaiting follow-up with ENT after result.   Return in about 1 year (around 06/17/2024) for f/u Dr. Judeth Horn.   Karren Burly, MD 06/18/2023

## 2023-07-16 ENCOUNTER — Ambulatory Visit (INDEPENDENT_AMBULATORY_CARE_PROVIDER_SITE_OTHER): Admitting: Otolaryngology

## 2023-07-16 ENCOUNTER — Telehealth (INDEPENDENT_AMBULATORY_CARE_PROVIDER_SITE_OTHER): Payer: Self-pay | Admitting: Otolaryngology

## 2023-07-16 ENCOUNTER — Encounter (INDEPENDENT_AMBULATORY_CARE_PROVIDER_SITE_OTHER): Payer: Self-pay

## 2023-07-16 VITALS — BP 132/69 | HR 51

## 2023-07-16 DIAGNOSIS — J342 Deviated nasal septum: Secondary | ICD-10-CM

## 2023-07-16 DIAGNOSIS — J324 Chronic pansinusitis: Secondary | ICD-10-CM | POA: Diagnosis not present

## 2023-07-16 DIAGNOSIS — J343 Hypertrophy of nasal turbinates: Secondary | ICD-10-CM

## 2023-07-16 NOTE — Telephone Encounter (Signed)
 Confirmed location and time for appt on 07/16/2023 with Dr.Teoh

## 2023-07-17 NOTE — Progress Notes (Signed)
 Patient ID: Grant [name], male   DOB: 09/01/1955, 68 y.o.   MRN: [id number]  Follow-up: Recurrent sinusitis, chronic nasal obstruction  HPI: The patient is a 68 year old male who returns today for his follow-up evaluation.  The patient was last seen in March 2025.  At that time, he was complaining of chronic nasal obstruction and recurrent sinusitis.  He was previously treated with multiple courses of antibiotics.  He was noted to have nasal mucosal congestion, severe nasal septal deviation, and bilateral inferior turbinate hypertrophy.  He was placed on Flonase  nasal spray daily.  He also underwent a sinus CT scan.  The CTs showed mucosal disease within his right frontal sinus, bilateral maxillary sinuses, bilateral ethmoid sinuses, and right sphenoid sinus.  In addition, the patient was also noted to have severe nasal septal deviation and bilateral inferior turbinate hypertrophy on his CT scan.  The patient returns today reporting slight improvement in his nasal congestion.  His facial pressure and discomfort have also improved with the consistent use of Flonase  daily.  Currently he denies any fever or visual change.  Exam: General: Communicates without difficulty, well nourished, no acute distress. Head: Normocephalic, no evidence injury, no tenderness, facial buttresses intact without stepoff. Face/sinus: No tenderness to palpation and percussion. Facial movement is normal and symmetric. Eyes: PERRL, EOMI. No scleral icterus, conjunctivae clear. Neuro: CN II exam reveals vision grossly intact.  No nystagmus at any point of gaze. Ears: Auricles well formed without lesions.  Ear canals are intact without mass or lesion.  No erythema or edema is appreciated.  The TMs are intact without fluid. Nose: External evaluation reveals normal support and skin without lesions.  Dorsum is intact.  Anterior rhinoscopy reveals congested mucosa over anterior aspect of inferior turbinates and deviated septum.  No purulence  noted. Oral:  Oral cavity and oropharynx are intact, symmetric, without erythema or edema.  Mucosa is moist without lesions. Neck: Full range of motion without pain.  There is no significant lymphadenopathy.  No masses palpable.  Thyroid bed within normal limits to palpation.  Parotid glands and submandibular glands equal bilaterally without mass.  Trachea is midline. Neuro:  CN 2-12 grossly intact.   Assessment:  1.  Chronic rhinosinusitis, with mucosal disease involving the right frontal, bilateral ethmoid, bilateral maxillary, and right sphenoid sinuses. 2.  Diffuse nasal mucosal congestion, with severe nasal septal deviation and bilateral inferior turbinate hypertrophy. 3.  The patient's symptoms have clinically improved with the use of Flonase  daily.  Plan: 1.  The physical exam findings and the CT images are extensively reviewed with the patient. 2.  Continue with Flonase  nasal spray 2 sprays each nostril daily. 3.  The surgical treatment options of septoplasty, bilateral turbinate reduction, and endoscopic sinus surgery are also discussed with the patient.  The risk, benefits, and details of the procedures are reviewed. 4.  In light of his clinical improvement, the patient would like to continue with medical treatment for now.  He would like to consider his options. 5.  The patient will return for reevaluation in 3 months, sooner if needed.

## 2023-07-21 ENCOUNTER — Other Ambulatory Visit: Payer: Self-pay | Admitting: *Deleted

## 2023-07-21 DIAGNOSIS — G2581 Restless legs syndrome: Secondary | ICD-10-CM

## 2023-07-21 DIAGNOSIS — G4761 Periodic limb movement disorder: Secondary | ICD-10-CM

## 2023-07-22 NOTE — Patient Instructions (Signed)
 Below is our plan:  We will continue Neupro  patch 1mg  every 24 hours. Continue to monitor skin irritation and let me know if it worsens. Try using hydrocortisone  cream on the area 30 minutes prior to application of patch.   Please make sure you are staying well hydrated. I recommend 50-60 ounces daily. Well balanced diet and regular exercise encouraged. Consistent sleep schedule with 6-8 hours recommended.   Please continue follow up with care team as directed.   Follow up with me in 1 year   You may receive a survey regarding today's visit. I encourage you to leave honest feed back as I do use this information to improve patient care. Thank you for seeing me today!

## 2023-07-22 NOTE — Progress Notes (Unsigned)
 No chief complaint on file.   HISTORY OF PRESENT ILLNESS:  07/22/23 ALL: Dequandre returns for follow up for OSa on CPAP, neuropathy and PLMD. He was last seen 02/2023 and requested to continue follow up with [facility]. He reports that his follow up appt was cancelled and he was advised to continue follow up with us .   He continues Neupro  1mg  patch daily.   02/25/2023 ALL:  Caedin returns for follow up for OSA on CPAP, neuropathy and PLMD. He was last seen by me 02/2022 and doing well on CPAP. Diclofenac  and Elma cream helped with neuropathy and he continued Neupro  patch daily with ropinirole  0.25mg  BID as needed. He was seen in consult with Dr Tat 06/2022 to evaluate tremor. DaT scan offered but declined. He was encouraged to discontinue ropinirole  and continue only Neupro  patch.   Since, he reports RLS is under good control on Neupro  alone. He has discontinued ropinirole . He feels neuropathy is stable on topical Elma and diclofenac . He reports CPAP causes him to get more congested. He has not used therapy consistently due to the congestion. He is using Flonase  daily with minimal relief. He was seen by PCP who referred him to ENT for chronic rhinitis and reported history of nasal polyps. He was seen by Girard Lam, NP with pulmonology who repeated HST. Results showed AHI 5/hr. O2 nadir 87%. He tells me that he is planning to discuss having Inspire implanted.     02/25/2022 ALL:  Myrtie Atkinson returns for follow up for OSA on CPAP, neuropathy and PLMD. He was last seen by Dr Gracie Lav. Biopsy consistent with small fiber neuropathy. He has continued diclofenac  and lidocaine  cream. Neuropathy pain is improved. R>L. Neupro  continued for RLS. He also takes ropinirole  0.25mg  BID. RLS is well managed. He has to use Flonase  at application site but feels that works well. He rotates sites. No obvious skin irritation noted. He is doing well with CPAP therapy. Now using nasal pillow. He does feel he rests better  with therapy.     01/02/2021 ALL: Kaushik returns for follow up for PLMD. He continues Neupro  patch 1mg  daily and ropinirole  0.25mg  1-2 times daily. He reports significant improvement in restless leg symptoms. He is sleeping better but does continue to wake up multiple times during the night. No obvious etiology. He monitors sleep on a Fitbit like device. He sleeps for about 5-6 hours a night. Device records that he is getting about 20% REM sleep. He feels that he continues to have excessive daytime sleepiness. He states that sleepiness waxes and wanes. ESS score is 16/24 today and he feels this is pretty accurate reflexion of his sleepiness. He has a hard time staying awake if sitting still and states he has felt like he was going to nod off while driving on occasion. He has never actually fallen asleep while driving. Split night study in 2018 that showed overall normal AHI with mild OSA in supine and REM sleep. He drinks about 2 cans of green tea daily. He is drinking 4-6 bottles of water daily. He feels decreased caffeine helps with RLS but he is more sleepy. He does wake up 2-3 times a night, usually to use restroom. Most of the time he can get back to sleep but sometimes it can take about an hour. He took melatonin several years ago but wasn't sure it helped but admits that he was having more difficulty with RLS then. He reports getting addicted to "sleep meds" with previous  PCP. He thinks it was a hypnotic.   01/03/2020 ALL:  HARUN CHAFFINS is a 68 y.o. male here today for follow up for PLMD and RLS. He was switched to Neupro  patch in 10/2019. He continues ropinirole  0.25mg  BID as needed. He has adjusted well to using patch. He feels daytime sleepiness has improved. He was started back on gabapentin  with GI, however, he did not tolerate it well. He reports more depression when taking it. He has discontinued it and feeling well.   07/01/2019 ALL: WHITNEY REDDINGER is a 68 y.o. male here today for follow up  for RLS. Sleep study in 07/2016 showed normal AHI but severe PMLD. He was previously taking 13 tablets of 0.25mg  ropinirole  throughout the day. We swithced to extended release in 10/2018. He reports taking ropinirole  XL 2 mg twice daily for a couple of months. He continues to have leg twitching. He has tried multiple different dosing times.  Most recently, he has taken ropinirole  ER 2 mg at bedtime as well as ropinirole  IR 0.25 mg 1-2 times throughout the day.  He feels that this regimen has been most beneficial.  Total daily dosing of 2.5 mg.    04/01/2019 ALL:  GIOVANY DEHOFF is a 68 y.o. male here today for follow up. 3,3 2at 5, 2at 9 and 3 at bedtime . He continues to have trouble with restlessness and jerking of both legs. He is taking 3 tablets every morning, 3 at lunch, 2 around 5pm, 2 at 9pm and 3 at bedtime for a total of 13 tablets daily (3.25mg .) He has had dizziness for the past couple of years but does not feel it is related to ropinirole . No side effects noted after taking ropinirole . Has tried Lyrica and gabapentin  in the past but reports that he did not feel well on these. He reports suicidal ideations with both.     HISTORY: (copied from [employer] note on 11/27/2017)   Mr. Masa is a 68 year old right-handed gentleman with an underlying medical history of hypertension, reflux disease, allergies, and overweight state, who presents for follow up consultation of his restless legs and PLMS. The patient is unaccompanied today. I last saw him on 11/19/2016, at which time he was taking ropinirole  0.25 mg strength at 3 different times. He had recently suffered serious dog bites that became infected and needed treatment for this as an inpatient. I suggested cautious increase in his Requip  0.25 mg strength 2 pills 3 times a day. He was advised regarding augmentation.    Today, 07/15/2017 (all dictated new, as well as above notes, some dictation done in note pad or Word, outside of chart, may appear  as copied):  He reports feeling about the same, maybe a little better. He takes requip  1 pill at 6 AM (2 pills was too much in AM), 1 to 2 at 4:30 and 2 at BT, which ranges from 10 PM to MN. Sometimes he has symptoms in his arms. Sx of RLS date back to several years ago. He may have tried gabapentin  in the past for Back pain, not sure if he tolerated it. He would be willing to retry it.    UPDATE 9/19/2019CM  Mr. Laflash, 68 year old male returns for follow-up with history of restless leg syndrome.  He claims that his restless legs are a little bit worse.  He thinks his Toprol  is making his restless legs worse so he has stopped the medication and wants us  to make a suggestion however in  reviewing side effects of Toprol  restless legs is not a side effect.  His blood pressure is elevated in the office today at 156/61.  He was encouraged to follow-up with his primary care regarding his blood pressure.  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  His legs are more bothersome today because he had to climb a ladder.  He was started back on gabapentin  after his last visit in May with Dr. Omar Bibber, however patient states once he got to 300 mg he had suicidal idealizations and he cut it back to 100 mg.  He has not had further suicidal idealzations on that dose.  He currently takes Requip  0.25mg  3 tablets twice a day.  He has a history of anemia in the past but his most recent CBC with hemoglobin of 13.5.  He returns for reevaluation    REVIEW OF SYSTEMS: Out of a complete 14 system review of symptoms, the patient complains only of the following symptoms, RLS, daytime sleepiness, and all other reviewed systems are negative.  ESS: 12/24, previously 8-9/24   ALLERGIES: Allergies  Allergen Reactions   Bee Venom Anaphylaxis, Swelling and Rash   Asa [Aspirin] Other (See Comments)    Gi upset   Atorvastatin  Other (See Comments)    Joint pain   Duloxetine  Other (See Comments)    GI upset   Zetia  [Ezetimibe ]  Other (See Comments)    Joint pain     HOME MEDICATIONS: Outpatient Medications Prior to Visit  Medication Sig Dispense Refill   albuterol  (VENTOLIN  HFA) 108 (90 Base) MCG/ACT inhaler Inhale 2 puffs into the lungs every 6 (six) hours as needed for wheezing or shortness of breath. 8 g 0   amLODipine  (NORVASC ) 5 MG tablet TAKE 1 TABLET DAILY (FOLLOW UP DUE IN AUGUST WITH LABS) 90 tablet 3   diclofenac  Sodium (VOLTAREN ) 1 % GEL APPLY 2 GM TOPICALLY FOUR TIMES A DAY AS NEEDED 400 g 3   EPINEPHrine  0.3 mg/0.3 mL IJ SOAJ injection INJECT 0.3 ML (0.3 MG) INTO THE MUSCLE ONCE FOR 1 DOSE 2 each 3   fluticasone  (FLONASE ) 50 MCG/ACT nasal spray Place 1 spray into both nostrils daily. 16 g 5   lidocaine -prilocaine  (EMLA ) cream 1 gram qid prn 30 g 11   losartan  (COZAAR ) 50 MG tablet TAKE 1 TABLET DAILY 90 tablet 3   pantoprazole  (PROTONIX ) 40 MG tablet TAKE 1 TABLET TWICE A DAY 180 tablet 1   rosuvastatin  (CRESTOR ) 5 MG tablet TAKE 1 TABLET DAILY 90 tablet 3   Rotigotine  (NEUPRO ) 1 MG/24HR PT24 Place 1 patch (1 mg total) onto the skin daily at 4 PM. 90 patch 1   Tiotropium Bromide-Olodaterol (STIOLTO RESPIMAT ) 2.5-2.5 MCG/ACT AERS Inhale 2 puffs into the lungs daily. 12 g 4   triamcinolone  cream (KENALOG ) 0.1 % Apply 1 Application topically 2 (two) times daily. 15 g 2   No facility-administered medications prior to visit.     PAST MEDICAL HISTORY: Past Medical History:  Diagnosis Date   Acid reflux    Allergy    Anemia    Anxiety    Basal cell carcinoma (BCC) of nasal tip    History of hiatal hernia    Hypertension    Insomnia    RLS (restless legs syndrome)    Sensorineural hearing loss    Von Willebrand disease (HCC)    PT STATES THIS IS MILD AND HAS NEVER HAD TO SEE HEMATOLOGIST-PT DID SAY THAT WHEN HE WAS CIRCUMC[place] IN [place] YE[place] AGO  HE BLED ALOT BUT IT WAS DUE TO NOT FOLLOWING POST OP INSTRUCTIONS      PAST SURGICAL HISTORY: Past Surgical History:  Procedure Laterality Date    CIRCUMCISION     AS AN ADULT   COLONOSCOPY     FOOT SURGERY Right    arch support   HERNIA REPAIR     INCISION AND DRAINAGE Right 02/04/2013   Procedure: INCISION AND DRAINAGE;  Surgeon: Shellie Dials, MD;  Location: [facility] OR;  Service: Orthopedics;  Laterality: Right;   INGUINAL HERNIA REPAIR Bilateral 05/13/2018   Procedure: LAPAROSCOPIC BILATERAL INGUINAL HERNIA REPAIR;  Surgeon: Emmalene Hare, MD;  Location: [facility] ORS;  Service: General;  Laterality: Bilateral;   INGUINAL HERNIA REPAIR Right 06/17/2019   Procedure: HERNIA REPAIR INGUINAL ADULT WITH MESH-- Recurrent;  Surgeon: Emmalene Hare, MD;  Location: [facility] ORS;  Service: General;  Laterality: Right;   OPEN REDUCTION INTERNAL FIXATION (ORIF) FINGER WITH RADIAL BONE GRAFT Right 02/04/2013   Procedure: OPEN REDUCTION INTERNAL FIXATION (ORIF) right ring and small fingers;  Surgeon: Shellie Dials, MD;  Location: [facility] OR;  Service: Orthopedics;  Laterality: Right;     FAMILY HISTORY: Family History  Problem Relation Age of Onset   Hypertension Mother    Hyperlipidemia Mother    Hypertension Father    Hyperlipidemia Father    Stroke Father        x2   Colon cancer Neg Hx    Esophageal cancer Neg Hx    Rectal cancer Neg Hx    Stomach cancer Neg Hx    Sleep apnea Neg Hx    Neuropathy Neg Hx    Pancreatic cancer Neg Hx      SOCIAL HISTORY: Social History   Socioeconomic History   Marital status: Married    Spouse name: Nicholette Barley   Number of children: 4   Years of education: 12   Highest education level: Not on file  Occupational History   Occupation: Copywriter, advertising    Occupation: retired    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  Tobacco Use   Smoking status: Former    Current packs/day: 0.00    Average packs/day: 0.5 packs/day for 40.0 years (20.0 ttl pk-yrs)    Types: Cigarettes    Start date: 05/10/1976    Quit date: 05/10/2016    Years since quitting: 7.2   Smokeless tobacco: Never  Vaping Use   Vaping status:  Former   Quit date: 05/10/2016  Substance and Sexual Activity   Alcohol use: No   Drug use: Yes    Types: Marijuana    Comment: uses daily   Sexual activity: Not on file  Other Topics Concern   Not on file  Social History Narrative   Denies caffeine use    Right handed    Lives with spouse and his daughter and 2 grandkids   Social Drivers of Corporate investment banker Strain: Low Risk  (03/31/2023)   Overall Financial Resource Strain (CARDIA)    Difficulty of Paying Living Expenses: Not very hard  Food Insecurity: No Food Insecurity (03/31/2023)   Hunger Vital Sign    Worried About Running Out of Food in the Last Year: Never true    Ran Out of Food in the Last Year: Never true  Transportation Needs: No Transportation Needs (03/31/2023)   PRAPARE - Administrator, Civil Service (Medical): No    Lack of Transportation (Non-Medical): No  Physical Activity: Inactive (03/31/2023)  Exercise Vital Sign    Days of Exercise per Week: 0 days    Minutes of Exercise per Session: 0 min  Stress: No Stress Concern Present (03/31/2023)   [employer] of Occupational Health - Occupational Stress Questionnaire    Feeling of Stress : Not at all  Social Connections: Moderately Integrated (03/31/2023)   Social Connection and Isolation Panel [NHANES]    Frequency of Communication with Friends and Family: More than three times a week    Frequency of Social Gatherings with Friends and Family: Never    Attends Religious Services: Never    Database administrator or Organizations: Yes    Attends Banker Meetings: 1 to 4 times per year    Marital Status: Married  Catering manager Violence: Not At Risk (03/31/2023)   Humiliation, Afraid, Rape, and Kick questionnaire    Fear of Current or Ex-Partner: No    Emotionally Abused: No    Physically Abused: No    Sexually Abused: No      PHYSICAL EXAM  There were no vitals filed for this visit.     There is no height or  weight on file to calculate BMI.   Generalized: Well developed, in no acute distress   Neurological examination  Mentation: Alert oriented to time, place, history taking. Follows all commands speech and language fluent Cranial nerve II-XII: Pupils were equal round reactive to light. Extraocular movements were full, visual field were full on confrontational test. Facial sensation and strength were normal. Head turning and shoulder shrug  were normal and symmetric. Motor: The motor testing reveals 5 over 5 strength of all 4 extremities. Good symmetric motor tone is noted throughout.  Sensory: Sensory testing is intact to soft touch on all 4 extremities. No evidence of extinction is noted.  Coordination: Cerebellar testing reveals good finger-nose-finger and heel-to-shin bilaterally.  Gait and station: Gait is normal.  Reflexes: Deep tendon reflexes are symmetric and normal bilaterally.     DIAGNOSTIC DATA (LABS, IMAGING, TESTING) - I reviewed patient records, labs, notes, testing and imaging myself where available.  Lab Results  Component Value Date   WBC 6.0 06/07/2021   HGB 14.3 06/07/2021   HCT 42.3 06/07/2021   MCV 91.3 06/07/2021   PLT 253.0 06/07/2021      Component Value Date/Time   NA 138 11/07/2022 0837   NA 142 07/03/2021 1535   K 4.3 11/07/2022 0837   CL 105 11/07/2022 0837   CO2 26 11/07/2022 0837   GLUCOSE 100 (H) 11/07/2022 0837   BUN 17 11/07/2022 0837   BUN 14 07/03/2021 1535   CREATININE 0.90 11/07/2022 0837   CREATININE 0.88 03/26/2021 1106   CALCIUM  9.4 11/07/2022 0837   PROT 6.9 11/07/2022 0837   PROT 6.7 07/03/2021 1535   ALBUMIN 4.3 11/07/2022 0837   ALBUMIN 4.7 07/03/2021 1535   AST 26 11/07/2022 0837   ALT 33 11/07/2022 0837   ALKPHOS 83 11/07/2022 0837   BILITOT 0.5 11/07/2022 0837   BILITOT 0.4 07/03/2021 1535   GFRNONAA 83 10/25/2019 0817   GFRAA 96 10/25/2019 0817   Lab Results  Component Value Date   CHOL 103 11/07/2022   HDL 43.10  11/07/2022   LDLCALC 51 11/07/2022   TRIG 43.0 11/07/2022   CHOLHDL 2 11/07/2022   Lab Results  Component Value Date   HGBA1C 5.6 07/03/2021   Lab Results  Component Value Date   VITAMINB12 539 07/24/2021   Lab Results  Component Value Date  TSH 1.58 03/26/2021      ASSESSMENT AND PLAN  68 y.o. year old male  has a past medical history of Acid reflux, Allergy, Anemia, Anxiety, Basal cell carcinoma (BCC) of nasal tip, History of hiatal hernia, Hypertension, Insomnia, RLS (restless legs syndrome), Sensorineural hearing loss, and Von Willebrand disease (HCC). here with   PLMD (periodic limb movement disorder)  OSA on CPAP  Neuropathy  Jensyn is doing well on Neupro  patch daily per Dr Jamel Mc recommendation and has discontinued ropinirole . Neuropathy appears well managed on Emla  and diclofenac  applied topically. Compliance data shows sub optimal compliance. Recent HST showed AHI 5/hr. He tells me that he is planning to pursue Inspire therapy through pulmonology. I am not certain he qualifies and have discussed considering trial off therapy and continued efforts of weight management. He wishes to transfer care of OSA to pulmonology where is followed for COPD. He also wishes to continue follow up with Dr Tat for management of RLS and neuropathy. I wish him all the best and will notify Dr Omar Bibber and Dr Gracie Lav. He was encouraged to continue working on healthy lifestyle habits. No follow up with [facility] needed. He verbalizes understanding and agreement with this plan.    No orders of the defined types were placed in this encounter.    No orders of the defined types were placed in this encounter.    I spent 30 minutes of face-to-face and non-face-to-face time with patient.  This included previsit chart review, lab review, study review, order entry, electronic health record documentation, patient education.   Terrilyn Fick, MSN, FNP-C 07/22/2023, 7:59 AM  [facility] [address], [address] [place], [place] [address] [phone]

## 2023-07-23 ENCOUNTER — Ambulatory Visit (INDEPENDENT_AMBULATORY_CARE_PROVIDER_SITE_OTHER): Admitting: Family Medicine

## 2023-07-23 ENCOUNTER — Encounter: Payer: Self-pay | Admitting: Family Medicine

## 2023-07-23 VITALS — BP 119/74 | HR 79 | Ht 66.0 in | Wt 181.0 lb

## 2023-07-23 DIAGNOSIS — G4733 Obstructive sleep apnea (adult) (pediatric): Secondary | ICD-10-CM | POA: Diagnosis not present

## 2023-07-23 DIAGNOSIS — G2581 Restless legs syndrome: Secondary | ICD-10-CM

## 2023-07-23 DIAGNOSIS — G629 Polyneuropathy, unspecified: Secondary | ICD-10-CM | POA: Diagnosis not present

## 2023-07-23 DIAGNOSIS — G4761 Periodic limb movement disorder: Secondary | ICD-10-CM

## 2023-07-23 MED ORDER — NEUPRO 1 MG/24HR TD PT24: 1.0000 mg | MEDICATED_PATCH | Freq: Every day | TRANSDERMAL | 1 refills | Status: DC

## 2023-08-12 ENCOUNTER — Other Ambulatory Visit: Payer: Self-pay | Admitting: *Deleted

## 2023-08-12 MED ORDER — DICLOFENAC SODIUM 1 % EX GEL: 2.0000 g | Freq: Four times a day (QID) | CUTANEOUS | 3 refills | Status: DC

## 2023-08-12 NOTE — Telephone Encounter (Signed)
 Last seen on 07/23/23 Follow up scheduled on 07/28/24

## 2023-08-15 ENCOUNTER — Ambulatory Visit: Payer: Medicare Other | Admitting: Nurse Practitioner

## 2023-08-16 ENCOUNTER — Encounter: Payer: Self-pay | Admitting: Family Medicine

## 2023-08-16 ENCOUNTER — Other Ambulatory Visit: Payer: Self-pay | Admitting: Neurology

## 2023-08-17 ENCOUNTER — Encounter: Payer: Self-pay | Admitting: Nurse Practitioner

## 2023-08-17 ENCOUNTER — Encounter: Payer: Self-pay | Admitting: Cardiovascular Disease

## 2023-08-21 ENCOUNTER — Encounter: Payer: Self-pay | Admitting: Nurse Practitioner

## 2023-09-08 ENCOUNTER — Ambulatory Visit (INDEPENDENT_AMBULATORY_CARE_PROVIDER_SITE_OTHER): Admitting: Family Medicine

## 2023-09-08 ENCOUNTER — Encounter: Payer: Self-pay | Admitting: Family Medicine

## 2023-09-08 ENCOUNTER — Other Ambulatory Visit: Payer: Self-pay | Admitting: Nurse Practitioner

## 2023-09-08 VITALS — BP 124/72 | HR 64 | Temp 98.0°F | Resp 16 | Ht 66.0 in | Wt 174.0 lb

## 2023-09-08 DIAGNOSIS — I10 Essential (primary) hypertension: Secondary | ICD-10-CM | POA: Diagnosis not present

## 2023-09-08 DIAGNOSIS — E785 Hyperlipidemia, unspecified: Secondary | ICD-10-CM | POA: Diagnosis not present

## 2023-09-08 DIAGNOSIS — R413 Other amnesia: Secondary | ICD-10-CM | POA: Diagnosis not present

## 2023-09-08 DIAGNOSIS — I700 Atherosclerosis of aorta: Secondary | ICD-10-CM | POA: Diagnosis not present

## 2023-09-08 NOTE — Progress Notes (Signed)
 Assessment & Plan Primary hypertension Well controlled on current regimen.   Aortic atherosclerosis (HCC) Currently taking a statin.  Hyperlipidemia, unspecified hyperlipidemia type Currently taking a statin.  Short-term memory loss Discussed stopping rosuvastatin  due to rare possibility that it could be contributing to memory issues and that IF it is, memory would improve in approximately three weeks. This way we would know one way or the other if rosuvastatin  is contributing. I have sent a staff message to his cardiologist, Dr. Mona, asking for his approval since he is the prescribing provider at the patient's request.     Return for 4 weeks after stopping statin.  Niki Rung, MSN, APRN, FNP-C  Subjective:      HPI: Grant [name] is a 68 y.o. male presenting on 09/08/2023 for Memory Loss (Pt states he is experiencing short term memory loss. Pt states neurologist and recently changed medications and he thinks it could be from medication changes.Pt stopped doing the Neupro  patch 4 days ago and started Requip  0.25mg )  Hypertension: tolerating Losartan  50 mg daily and Amlodipine  5 mg daily, which he is able to receive from the [facility] currently.   Hyperlipidemia: taking rosuvastatin  5 mg daily prescribed by Dr. Mona (cardiology)   New complaints: Patient is concerned that his medications are causing short term memory issues. States he walks into a room and forgets why, or goes to the store and can't remember everything he is supposed to buy unless he makes a list. He recently stopped the Neupro  patch, which was one of the medications he thought may be contributing and would like to discuss rosuvastatin .     Social history:  Relevant past medical, surgical, family and social history reviewed and updated as indicated. Interim medical history since our last visit reviewed.  Allergies and medications reviewed and updated.  DATA REVIEWED: CHART IN EPIC  ROS: Negative unless  specifically indicated above in HPI.    Current Outpatient Medications:    albuterol  (VENTOLIN  HFA) 108 (90 Base) MCG/ACT inhaler, Inhale 2 puffs into the lungs every 6 (six) hours as needed for wheezing or shortness of breath., Disp: 8 g, Rfl: 0   amLODipine  (NORVASC ) 5 MG tablet, TAKE 1 TABLET DAILY (FOLLOW UP DUE IN AUGUST WITH LABS), Disp: 90 tablet, Rfl: 3   diclofenac  Sodium (VOLTAREN ) 1 % GEL, Apply 2 g topically 4 (four) times daily., Disp: 400 g, Rfl: 3   fluticasone  (FLONASE ) 50 MCG/ACT nasal spray, Place 1 spray into both nostrils daily. (Patient taking differently: Place 1 spray into both nostrils 2 (two) times daily.), Disp: 16 g, Rfl: 5   lidocaine -prilocaine  (EMLA ) cream, 1 gram qid prn, Disp: 30 g, Rfl: 11   losartan  (COZAAR ) 50 MG tablet, TAKE 1 TABLET DAILY, Disp: 90 tablet, Rfl: 3   pantoprazole  (PROTONIX ) 40 MG tablet, TAKE 1 TABLET TWICE A DAY, Disp: 180 tablet, Rfl: 1   rOPINIRole  (REQUIP ) 0.25 MG tablet, Take by mouth., Disp: , Rfl:    rosuvastatin  (CRESTOR ) 5 MG tablet, TAKE 1 TABLET DAILY, Disp: 90 tablet, Rfl: 3   Tiotropium Bromide-Olodaterol (STIOLTO RESPIMAT ) 2.5-2.5 MCG/ACT AERS, Inhale 2 puffs into the lungs daily., Disp: 12 g, Rfl: 4   triamcinolone  cream (KENALOG ) 0.1 %, Apply 1 Application topically 2 (two) times daily., Disp: 15 g, Rfl: 2   EPINEPHrine  0.3 mg/0.3 mL IJ SOAJ injection, INJECT 0.3 ML (0.3 MG) INTO THE MUSCLE ONCE FOR 1 DOSE (Patient not taking: Reported on 09/08/2023), Disp: 2 each, Rfl: 3  Objective:    BP 124/72   Pulse 64   Temp 98 F (36.7 C)   Resp 16   Ht 5' 6 (1.676 m)   Wt 174 lb (78.9 kg)   SpO2 96%   BMI 28.08 kg/m   Wt Readings from Last 3 Encounters:  09/08/23 174 lb (78.9 kg)  07/23/23 181 lb (82.1 kg)  06/18/23 181 lb (82.1 kg)    Physical Exam Vitals reviewed.  Constitutional:      General: He is not in acute distress.    Appearance: Normal appearance. He is not ill-appearing, toxic-appearing or diaphoretic.   HENT:     Head: Normocephalic and atraumatic.   Eyes:     General: No scleral icterus.       Right eye: No discharge.        Left eye: No discharge.     Conjunctiva/sclera: Conjunctivae normal.    Cardiovascular:     Rate and Rhythm: Normal rate and regular rhythm.     Heart sounds: Normal heart sounds. No murmur heard.    No friction rub. No gallop.  Pulmonary:     Effort: Pulmonary effort is normal. No respiratory distress.     Breath sounds: Normal breath sounds. No stridor. No wheezing, rhonchi or rales.   Musculoskeletal:        General: Normal range of motion.     Cervical back: Normal range of motion.   Skin:    General: Skin is warm and dry.   Neurological:     Mental Status: He is alert and oriented to person, place, and time. Mental status is at baseline.   Psychiatric:        Mood and Affect: Mood normal.        Behavior: Behavior normal.        Thought Content: Thought content normal.        Judgment: Judgment normal.    The ASCVD Risk score (Arnett DK, et al., 2019) failed to calculate for the following reasons:   The valid total cholesterol range is 130 to 320 mg/dL

## 2023-09-08 NOTE — Assessment & Plan Note (Signed)
 Discussed stopping rosuvastatin  due to rare possibility that it could be contributing to memory issues and that IF it is, memory would improve in approximately three weeks. This way we would know one way or the other if rosuvastatin  is contributing. I have sent a staff message to his cardiologist, Dr. Mona, asking for his approval since he is the prescribing provider at the patient's request.

## 2023-09-08 NOTE — Assessment & Plan Note (Signed)
 Well-controlled on current regimen. ?

## 2023-09-08 NOTE — Assessment & Plan Note (Signed)
 Currently taking a statin.

## 2023-09-09 ENCOUNTER — Encounter: Payer: Self-pay | Admitting: Nurse Practitioner

## 2023-09-10 ENCOUNTER — Other Ambulatory Visit: Payer: Self-pay

## 2023-09-10 DIAGNOSIS — I10 Essential (primary) hypertension: Secondary | ICD-10-CM

## 2023-09-10 MED ORDER — LOSARTAN POTASSIUM 50 MG PO TABS: 50.0000 mg | ORAL_TABLET | Freq: Every day | ORAL | 3 refills | Status: AC

## 2023-09-11 ENCOUNTER — Encounter: Payer: Self-pay | Admitting: Family Medicine

## 2023-10-02 ENCOUNTER — Ambulatory Visit: Admitting: Nurse Practitioner

## 2023-10-17 ENCOUNTER — Ambulatory Visit (INDEPENDENT_AMBULATORY_CARE_PROVIDER_SITE_OTHER): Admitting: Otolaryngology

## 2023-10-17 ENCOUNTER — Encounter (INDEPENDENT_AMBULATORY_CARE_PROVIDER_SITE_OTHER): Payer: Self-pay | Admitting: Otolaryngology

## 2023-10-17 VITALS — BP 132/69 | HR 55

## 2023-10-17 DIAGNOSIS — J342 Deviated nasal septum: Secondary | ICD-10-CM

## 2023-10-17 DIAGNOSIS — R0981 Nasal congestion: Secondary | ICD-10-CM | POA: Diagnosis not present

## 2023-10-17 DIAGNOSIS — J310 Chronic rhinitis: Secondary | ICD-10-CM

## 2023-10-17 DIAGNOSIS — J343 Hypertrophy of nasal turbinates: Secondary | ICD-10-CM | POA: Diagnosis not present

## 2023-10-17 DIAGNOSIS — J324 Chronic pansinusitis: Secondary | ICD-10-CM

## 2023-10-19 NOTE — Progress Notes (Signed)
 Patient ID: Grant [name], male   DOB: 08/09/1955, 68 y.o.   MRN: [id number]  Follow-up: Chronic nasal obstruction, recurrent sinusitis  HPI: The patient is a 68 year old male who returns today for his follow-up evaluation.  The patient has a history of chronic nasal obstruction and recurrent sinusitis.  At his last visit in May 2025, he was noted to have chronic rhinosinusitis, with mucosal disease involving the right frontal, bilateral ethmoid, bilateral maxillary, and right sphenoid sinuses.  He was also noted to have severe nasal septal deviation and bilateral inferior turbinate hypertrophy.  The treatment options were discussed.  The patient elected to proceed with continuing medical therapy.  He was previously treated with multiple courses of antibiotics.  He was continued on Flonase  nasal spray daily.  The patient returns today reporting no recent sinusitis.  He has occasional nasal congestion.  Currently he denies any facial pain, fever, or visual change.  Exam: General: Communicates without difficulty, well nourished, no acute distress. Head: Normocephalic, no evidence injury, no tenderness, facial buttresses intact without stepoff. Face/sinus: No tenderness to palpation and percussion. Facial movement is normal and symmetric. Eyes: PERRL, EOMI. No scleral icterus, conjunctivae clear. Neuro: CN II exam reveals vision grossly intact.  No nystagmus at any point of gaze. Ears: Auricles well formed without lesions.  Ear canals are intact without mass or lesion.  No erythema or edema is appreciated.  The TMs are intact without fluid. Nose: External evaluation reveals normal support and skin without lesions.  Dorsum is intact.  Anterior rhinoscopy reveals congested mucosa over anterior aspect of inferior turbinates and deviated septum.  No purulence noted. Oral:  Oral cavity and oropharynx are intact, symmetric, without erythema or edema.  Mucosa is moist without lesions. Neck: Full range of motion without  pain.  There is no significant lymphadenopathy.  No masses palpable.  Thyroid bed within normal limits to palpation.  Parotid glands and submandibular glands equal bilaterally without mass.  Trachea is midline. Neuro:  CN 2-12 grossly intact.    Assessment:  1.  Chronic rhinosinusitis, with nasal septal deviation and bilateral inferior turbinate hypertrophy. 2.  The patient has no recent acute exacerbation. 3.  The severity of his nasal congestion has decreased.  Plan: 1.  The physical exam findings are reviewed with the patient. 2.  Continue with Flonase  nasal spray 2 sprays each nostril daily. 3.  Nasal saline irrigation daily. 4.  The patient will return for reevaluation in 6 months, sooner if needed.

## 2023-10-26 ENCOUNTER — Encounter: Payer: Self-pay | Admitting: Family Medicine

## 2023-10-27 ENCOUNTER — Other Ambulatory Visit: Payer: Self-pay | Admitting: Gastroenterology

## 2023-10-27 ENCOUNTER — Other Ambulatory Visit: Payer: Self-pay | Admitting: Pulmonary Disease

## 2023-11-06 ENCOUNTER — Encounter: Payer: Self-pay | Admitting: Nurse Practitioner

## 2023-11-07 ENCOUNTER — Ambulatory Visit: Admitting: Nurse Practitioner

## 2023-11-13 ENCOUNTER — Ambulatory Visit: Admitting: Nurse Practitioner

## 2023-11-14 ENCOUNTER — Encounter: Payer: Self-pay | Admitting: Nurse Practitioner

## 2023-11-14 NOTE — Progress Notes (Signed)
 This encounter was created in error - please disregard.

## 2023-11-17 ENCOUNTER — Other Ambulatory Visit: Payer: Self-pay | Admitting: Nurse Practitioner

## 2023-11-17 ENCOUNTER — Other Ambulatory Visit

## 2023-11-17 DIAGNOSIS — D6800 Von Willebrand disease, unspecified: Secondary | ICD-10-CM

## 2023-11-17 DIAGNOSIS — J432 Centrilobular emphysema: Secondary | ICD-10-CM

## 2023-11-17 DIAGNOSIS — I10 Essential (primary) hypertension: Secondary | ICD-10-CM

## 2023-11-18 LAB — IRON AND TIBC
Iron Saturation: 25 % (ref 15–55)
Iron: 72 ug/dL (ref 38–169)
Total Iron Binding Capacity: 292 ug/dL (ref 250–450)
UIBC: 220 ug/dL (ref 111–343)

## 2023-11-18 LAB — FERRITIN: Ferritin: 520 ng/mL — ABNORMAL HIGH (ref 30–400)

## 2023-11-20 ENCOUNTER — Ambulatory Visit (INDEPENDENT_AMBULATORY_CARE_PROVIDER_SITE_OTHER): Admitting: Nurse Practitioner

## 2023-11-20 ENCOUNTER — Encounter: Payer: Self-pay | Admitting: Nurse Practitioner

## 2023-11-20 VITALS — BP 124/82 | HR 69 | Temp 98.7°F | Ht 66.0 in | Wt 176.0 lb

## 2023-11-20 DIAGNOSIS — I700 Atherosclerosis of aorta: Secondary | ICD-10-CM | POA: Diagnosis not present

## 2023-11-20 DIAGNOSIS — R413 Other amnesia: Secondary | ICD-10-CM | POA: Diagnosis not present

## 2023-11-20 DIAGNOSIS — G2581 Restless legs syndrome: Secondary | ICD-10-CM | POA: Diagnosis not present

## 2023-11-20 NOTE — Assessment & Plan Note (Signed)
 Restless legs syndrome Well-managed with iron infusion. Ferritin levels slightly elevated but not concerning. Neopro patch caused skin irritation. TENS unit no longer needed. Symptoms relieved by rest. - Follow up with Doctor Maylon in one month to discuss iron levels and management.

## 2023-11-20 NOTE — Assessment & Plan Note (Signed)
 Memory impairment Memory improved after discontinuation but worsened upon resumption. Previous statins caused joint issues. Benefits of statins usually outweigh risks, but cognitive effects possible, especially with age. - Send referral to Doctor [facility] for consultation on cholesterol management and potential alternatives to rosuvastatin .

## 2023-11-20 NOTE — Progress Notes (Signed)
 Established Patient Office Visit  Subjective   Patient ID: Grant [name], male    DOB: [birth year]  Age: 68 y.o. MRN: [id number]  Chief Complaint  Patient presents with   Memory Loss    Discussed the use of AI scribe software for clinical note transcription with the patient, who gave verbal consent to proceed.  History of Present Illness Grant [name] is a 68 year old male with restless leg syndrome and memory issues who presents for follow-up on iron infusion and medication management.  Restless leg syndrome - Following with [facility] and getting iron infusion therapy for restless leg syndrome. Symptoms are vastly improved.  - Iron levels have improved with treatment - Discontinued Neopro patch and TENS unit bands due to severe skin irritation and scarring from adhesive allergy  Cognitive impairment - Memory issues persist and are associated with rosuvastatin  use - Temporary discontinuation of rosuvastatin  did not result in significant memory improvement - Resumed rosuvastatin ; perceives memory issues may be slightly worse since restarting          Objective:     BP 124/82 (BP Location: Left Arm, Patient Position: Sitting, Cuff Size: Normal)   Pulse 69   Temp 98.7 F (37.1 C) (Oral)   Ht 5' 6 (1.676 m)   Wt 176 lb (79.8 kg)   SpO2 98%   BMI 28.41 kg/m    Physical Exam Vitals reviewed.  Constitutional:      Appearance: Normal appearance.  HENT:     Head: Normocephalic and atraumatic.  Cardiovascular:     Rate and Rhythm: Normal rate and regular rhythm.  Pulmonary:     Effort: Pulmonary effort is normal.     Breath sounds: Normal breath sounds.  Musculoskeletal:     Cervical back: Neck supple.  Skin:    General: Skin is warm and dry.  Neurological:     Mental Status: He is alert and oriented to person, place, and time.  Psychiatric:        Mood and Affect: Mood normal.        Behavior: Behavior normal.        Thought Content: Thought content normal.         Judgment: Judgment normal.      No results found for any visits on 11/20/23.    The ASCVD Risk score (Arnett DK, et al., 2019) failed to calculate for the following reasons:   The valid total cholesterol range is 130 to 320 mg/dL    Assessment & Plan:   Problem List Items Addressed This Visit       Cardiovascular and Mediastinum   Aortic atherosclerosis [facility])   Referral to Dr. Mona today      Relevant Orders   Ambulatory referral to Cardiology     Other   Restless leg syndrome   Restless legs syndrome Well-managed with iron infusion. Ferritin levels slightly elevated but not concerning. Neopro patch caused skin irritation. TENS unit no longer needed. Symptoms relieved by rest. - Follow up with Doctor Maylon in one month to discuss iron levels and management.      Short-term memory loss - Primary   Memory impairment Memory improved after discontinuation but worsened upon resumption. Previous statins caused joint issues. Benefits of statins usually outweigh risks, but cognitive effects possible, especially with age. - Send referral to Doctor [place] [facility] for consultation on cholesterol management and potential alternatives to rosuvastatin .      Assessment and Plan Assessment & Plan Restless legs syndrome  Well-managed with iron infusion. Ferritin levels slightly elevated but not concerning. Neopro patch caused skin irritation. TENS unit no longer needed. Symptoms relieved by rest. - Follow up with Doctor Maylon in one month to discuss iron levels and management.  Memory impairment Memory improved after discontinuation but worsened upon resumption. Previous statins caused joint issues. Benefits of statins usually outweigh risks, but cognitive effects possible, especially with age. - Send referral to Doctor [facility] for consultation on cholesterol management and potential alternatives to rosuvastatin .   Return in about 3 months (around 02/19/2024) for F/U with Grant.     Grant FORBES Pereyra, NP

## 2023-11-20 NOTE — Assessment & Plan Note (Signed)
 Referral to Dr. Mona today

## 2023-11-25 ENCOUNTER — Other Ambulatory Visit: Payer: Self-pay | Admitting: Nurse Practitioner

## 2023-11-26 ENCOUNTER — Encounter: Payer: Self-pay | Admitting: Family Medicine

## 2023-11-26 ENCOUNTER — Other Ambulatory Visit: Payer: Self-pay

## 2023-11-26 MED ORDER — LIDOCAINE-PRILOCAINE 2.5-2.5 % EX CREA: TOPICAL_CREAM | CUTANEOUS | 5 refills | Status: AC

## 2023-11-26 NOTE — Telephone Encounter (Signed)
 Pt last Seen 07/23/2023 Upcoming Appointment 07/28/2024  Lidocaine -prilocaine  (EMLA ) Cream Last Filled 01/30/2023

## 2024-01-07 ENCOUNTER — Encounter: Payer: Self-pay | Admitting: Nurse Practitioner

## 2024-01-12 ENCOUNTER — Encounter: Payer: Self-pay | Admitting: Family Medicine

## 2024-01-12 NOTE — Telephone Encounter (Signed)
 Can we add pt to cancellation list please?  Forwarding to Dr. Joane as RICK.

## 2024-01-12 NOTE — Telephone Encounter (Signed)
 Added to wait list.

## 2024-01-15 ENCOUNTER — Encounter: Payer: Self-pay | Admitting: Family Medicine

## 2024-01-15 ENCOUNTER — Ambulatory Visit (INDEPENDENT_AMBULATORY_CARE_PROVIDER_SITE_OTHER): Admitting: Family Medicine

## 2024-01-15 ENCOUNTER — Other Ambulatory Visit: Payer: Self-pay

## 2024-01-15 VITALS — BP 120/60 | HR 63 | Ht 66.0 in | Wt 168.0 lb

## 2024-01-15 DIAGNOSIS — M25511 Pain in right shoulder: Secondary | ICD-10-CM

## 2024-01-15 DIAGNOSIS — G8929 Other chronic pain: Secondary | ICD-10-CM

## 2024-01-15 NOTE — Progress Notes (Unsigned)
   I, [name], CMA acting as a scribe for [name], MD.  Grant [name] is a 68 y.o. male who presents to [facility] today for R shoulder pain x 8 months. Pt locates pain to anterior aspect of the shoulder radiating into the upper arm. Did a lot of overhead work in the past. Notes having flat feet and abnormal gait. Denies n/t but does have some weakness. Sx worse with lateral raising and weighted lifting. Also worse with reaching back. Limited ROM reaching overhead and reaching back. Some decrease in grip strength. Mild neck pain and stiffness.   Radiates: R UE Aggravates: overhead reaching, lateral raises Treatments tried: Voltaren  Gel, Lido Patch  Pertinent review of systems: No fevers or chills  Relevant historical information: Hypertension   Exam:  BP 120/60   Pulse 63   Ht 5' 6 (1.676 m)   Wt 168 lb (76.2 kg)   SpO2 97%   BMI 27.12 kg/m  General: Well Developed, well nourished, and in no acute distress.   MSK: Right shoulder: Normal-appearing Range of motion abduction slightly limited.  Functional internal rotation is slight limited as well. Strength reduced abduction and external rotation. Positive Hawkins and Neer's test. Negative Yergason's and speeds test.    Lab and Radiology Results  Patient has had an x-ray at the [facility].  He showed me images of his shoulder on his cell phone from the last month or 2. Images per my interpretation shows some mild glenohumeral DJD.     Assessment and Plan: 68 y.o. male with right shoulder pain due to either rotator cuff impingement or tendinopathy or tendon tear.  There is some component of DJD as well.  Plan for MRI to further characterize source of pain.  Patient has already completed a good trial of home exercise program and physical therapy directed by physicians.  This has occurred over the last 2 months.   PDMP not reviewed this encounter. Orders Placed This Encounter  Procedures    MR SHOULDER RIGHT WO CONTRAST    Standing Status:   Future    Expiration Date:   01/14/2025    What is the patient's sedation requirement?:   No Sedation    Does the patient have a pacemaker or implanted devices?:   No    Preferred imaging location?:   [facility] (table limit-550lbs)   No orders of the defined types were placed in this encounter.    Discussed warning signs or symptoms. Please see discharge instructions. Patient expresses understanding.   The above documentation has been reviewed and is accurate and complete [name], M.D.

## 2024-01-15 NOTE — Telephone Encounter (Signed)
 I called [facility] pharmacy. The Voltaren  1% gel and the generic form are no longer covered by patient's insurance because it is available over the counter.

## 2024-01-15 NOTE — Patient Instructions (Signed)
 Thank you for coming in today.  You should hear from MRI scheduling within 1 week. If you do not hear please let me know.

## 2024-01-26 ENCOUNTER — Other Ambulatory Visit: Payer: Self-pay | Admitting: Family

## 2024-01-26 DIAGNOSIS — I10 Essential (primary) hypertension: Secondary | ICD-10-CM

## 2024-01-27 ENCOUNTER — Ambulatory Visit
Admission: RE | Admit: 2024-01-27 | Discharge: 2024-01-27 | Disposition: A | Source: Ambulatory Visit | Attending: Family Medicine | Admitting: Family Medicine

## 2024-01-27 DIAGNOSIS — G8929 Other chronic pain: Secondary | ICD-10-CM

## 2024-01-29 ENCOUNTER — Encounter: Payer: Self-pay | Admitting: Family Medicine

## 2024-01-29 NOTE — Telephone Encounter (Signed)
 Per visit note 01/15/24:  Assessment and Plan: 68 y.o. male with right shoulder pain due to either rotator cuff impingement or tendinopathy or tendon tear.  There is some component of DJD as well.  Plan for MRI to further characterize source of pain.  Patient has already completed a good trial of home exercise program and physical therapy directed by physicians.  This has occurred over the last 2 months.

## 2024-02-01 ENCOUNTER — Encounter: Payer: Self-pay | Admitting: Family Medicine

## 2024-02-01 NOTE — Progress Notes (Unsigned)
 Cardiology Office Note:  .   Date:  02/02/2024  ID:  Grant [name], DOB 1955-04-07, MRN [id number] PCP: Elnor Lauraine BRAVO, NP   HeartCare Providers Cardiologist:  Deatrice Cage, MD   History of Present Illness: .    Chief Complaint  Patient presents with   Follow-up    Grant [name] is a 68 y.o. male with history of CAD who presents for follow-up.    History of Present Illness   Grant [name] is a 68 year old male who presents with memory issues and medication management.  He has been experiencing significant memory issues, initially associating them with the use of rosuvastatin  and a patch for restless leg syndrome. After discontinuing the patch and receiving an iron infusion, he noted improvement in his restless leg symptoms and memory issues since stopping the patch.  His coronary artery disease was evaluated with a CT scan two years ago, revealing some calcium  but no significant blockages. He is currently on rosuvastatin  5 mg daily for cholesterol management, with a recent LDL of 51. No chest pain or significant breathing issues, aside from sinus-related problems, have been reported.  Hypertension is managed with amlodipine  5 mg daily and losartan  50 mg daily. He has no history of heart attack or stroke. He also has mild COPD, attributed to previous overweight status, but has lost a significant amount of weight.  He has Von Willebrand's disease, diagnosed after a hemorrhage following adult circumcision. He avoids aspirin due to this condition. One of his daughters also has a mild case of Von Willebrand's disease.  He reports swelling in his legs and neuropathy. He is not currently working and has a history of hernia surgeries. He does not smoke and consumes alcohol only occasionally. 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          Problem List CAD -CAC 122 (56th percentile) -moderate mLAD (CT FFR 0.84) 2. HLD -T chol 103, HDL  43, LDL 51, TG 43 3. HTN 4. COPD 5. Von Willebrand's disease     ROS: All other ROS reviewed and negative. Pertinent positives noted in the HPI.     Studies Reviewed: SABRA       CCTA 04/16/2021 IMPRESSION: 1. Coronary calcium  score of 122. This was 56th percentile for age and sex matched control.   2. Normal coronary origin with right dominance.   3. Calcified plaque causing moderate mid LAD disease.   4. Mid stenosis in the proximal LAD and RCA.   5. CAD-RADS 3. Moderate stenosis. Consider symptom-guided anti-ischemic pharmacotherapy as well as risk factor modification per guideline directed care.   6. Additional analysis with CT FFR will be submitted and reported separately. Physical Exam:   VS:  BP (!) 102/52   Pulse 63   Ht 5' 6 (1.676 m)   Wt 170 lb (77.1 kg)   SpO2 97%   BMI 27.44 kg/m    Wt Readings from Last 3 Encounters:  02/02/24 170 lb (77.1 kg)  01/15/24 168 lb (76.2 kg)  11/20/23 176 lb (79.8 kg)    GEN: Well nourished, well developed in no acute distress NECK: No JVD; No carotid bruits CARDIAC: RRR, no murmurs, rubs, gallops RESPIRATORY:  Clear to auscultation without rales, wheezing or rhonchi  ABDOMEN: Soft, non-tender, non-distended EXTREMITIES:  No edema; No deformity  ASSESSMENT AND PLAN: .   Assessment and Plan    Coronary artery disease without angina Non-destructive coronary artery disease with borderline calcium   score of 122 (56th percentile) on CT scan from 2023. No significant blockages. No history of myocardial infarction or stroke. No current chest pain or dyspnea. - Continue current management and monitoring.  Hypertension Well-controlled with amlodipine  and losartan . - Continue amlodipine  5 mg daily. - Continue losartan  50 mg daily.  Mixed hyperlipidemia with statin-associated memory concerns Mixed hyperlipidemia managed with rosuvastatin  5 mg daily. Concerns about memory issues potentially related to rosuvastatin . Memory issues  improved after discontinuation of a pro patch for restless leg syndrome. Considering trial off rosuvastatin  to assess memory improvement. Discussed alternative lipid-lowering therapies including Repatha and Zetia . - Hold rosuvastatin  for four weeks to assess memory improvement. - If no improvement, resume rosuvastatin . - Consider alternative lipid-lowering therapies if needed.  Von Willebrand disease Mild Von Willebrand disease with no current bleeding issues. Not on aspirin due to bleeding risk. - Continue to avoid aspirin.              Follow-up: Return in about 1 year (around 02/01/2025).  Signed, Darryle DASEN. Barbaraann, MD, [facility]  [facility]  [address] [place], [place] [address] [phone]  1:16 PM

## 2024-02-02 ENCOUNTER — Ambulatory Visit (INDEPENDENT_AMBULATORY_CARE_PROVIDER_SITE_OTHER): Admitting: Cardiovascular Disease

## 2024-02-02 ENCOUNTER — Encounter (HOSPITAL_BASED_OUTPATIENT_CLINIC_OR_DEPARTMENT_OTHER): Payer: Self-pay | Admitting: Cardiovascular Disease

## 2024-02-02 ENCOUNTER — Ambulatory Visit: Payer: Self-pay | Admitting: Family Medicine

## 2024-02-02 VITALS — BP 102/52 | HR 63 | Ht 66.0 in | Wt 170.0 lb

## 2024-02-02 DIAGNOSIS — I2510 Atherosclerotic heart disease of native coronary artery without angina pectoris: Secondary | ICD-10-CM | POA: Diagnosis not present

## 2024-02-02 DIAGNOSIS — I150 Renovascular hypertension: Secondary | ICD-10-CM | POA: Diagnosis not present

## 2024-02-02 DIAGNOSIS — E782 Mixed hyperlipidemia: Secondary | ICD-10-CM

## 2024-02-02 NOTE — Patient Instructions (Signed)
 Medication Instructions:  HOLD YOUR ROSUVASTATIN  FOR 4 WEEKS. CALL OR MYCHART UPDATE IN 4 WEEKS   *If you need a refill on your cardiac medications before your next appointment, please call your pharmacy*  Lab Work: NONE  Testing/Procedures: NONE   Follow-Up: At [facility], you and your health needs are our priority.  As part of our continuing mission to provide you with exceptional heart care, our providers are all part of one team.  This team includes your primary Cardiologist (physician) and Advanced Practice Providers or APPs (Physician Assistants and Nurse Practitioners) who all work together to provide you with the care you need, when you need it.  Your next appointment:   12 month(s)  Provider:   DR BARBARAANN OR One of our Advanced Practice Providers (APPs): Morse Clause, PA-C  Lamarr Satterfield, NP Miriam Shams, NP  Olivia Pavy, PA-C Josefa Beauvais, NP  Leontine Salen, PA-C Orren Fabry, PA-C  [place], PA-C Ernest Dick, NP  Damien Braver, NP Jon Hails, PA-C  Waddell Donath, PA-C    Dayna Dunn, PA-C  Scott Weaver, PA-C Lum Louis, NP Katlyn West, NP Callie Goodrich, PA-C  Xika Zhao, NP Sheng Haley, PA-C    Kathleen Johnson, PA-C   We recommend signing up for the patient portal called MyChart.  Sign up information is provided on this After Visit Summary.  MyChart is used to connect with patients for Virtual Visits (Telemedicine).  Patients are able to view lab/test results, encounter notes, upcoming appointments, etc.  Non-urgent messages can be sent to your provider as well.   To learn more about what you can do with MyChart, go to [email or web address].

## 2024-02-02 NOTE — Progress Notes (Signed)
 Right shoulder MRI shows rotator cuff tendinitis without a severe tear.  There is bursitis as well and some mild shoulder arthritis. Recommend return to clinic in the near future to go over the results of this MRI and consider a trial of an injection.

## 2024-02-09 ENCOUNTER — Ambulatory Visit: Admitting: Gastroenterology

## 2024-02-09 VITALS — BP 100/54 | HR 57 | Ht 66.0 in | Wt 170.1 lb

## 2024-02-09 DIAGNOSIS — R12 Heartburn: Secondary | ICD-10-CM | POA: Diagnosis not present

## 2024-02-09 DIAGNOSIS — K219 Gastro-esophageal reflux disease without esophagitis: Secondary | ICD-10-CM

## 2024-02-09 DIAGNOSIS — Z860100 Personal history of colon polyps, unspecified: Secondary | ICD-10-CM

## 2024-02-09 DIAGNOSIS — K449 Diaphragmatic hernia without obstruction or gangrene: Secondary | ICD-10-CM | POA: Diagnosis not present

## 2024-02-09 NOTE — Patient Instructions (Signed)
 _______________________________________________________  If your blood pressure at your visit was 140/90 or greater, please contact your primary care physician to follow up on this.  _______________________________________________________  If you are age 69 or older, your body mass index should be between 23-30. Your Body mass index is 27.46 kg/m. If this is out of the aforementioned range listed, please consider follow up with your Primary Care Provider.  If you are age 68 or younger, your body mass index should be between 19-25. Your Body mass index is 27.46 kg/m. If this is out of the aformentioned range listed, please consider follow up with your Primary Care Provider.   ________________________________________________________  The [facility] providers would like to encourage you to use MYCHART to communicate with providers for non-urgent requests or questions.  Due to long hold times on the telephone, sending your provider a message by [facility] may be a faster and more efficient way to get a response.  Please allow 48 business hours for a response.  Please remember that this is for non-urgent requests.  _______________________________________________________  [facility] is using a team-based approach to care.  Your team is made up of your doctor and two to three APPS. Our APPS (Nurse Practitioners and Physician Assistants) work with your physician to ensure care continuity for you. They are fully qualified to address your health concerns and develop a treatment plan. They communicate directly with your gastroenterologist to care for you. Seeing the Advanced Practice Practitioners on your physician's team can help you by facilitating care more promptly, often allowing for earlier appointments, access to diagnostic testing, procedures, and other specialty referrals.   It was a pleasure to see you today!  Vito Cirigliano, D.O.

## 2024-02-09 NOTE — Progress Notes (Signed)
 Chief Complaint:    GERD  GI History: 68 year old male with history of von Willebrand's disease, anemia, HTN, dyslipidemia, CAD, emphysema, RLS, anxiety, GERD, hiatal hernia, BCC, was in GI clinic for the following:   - 02/12/2008: Colonoscopy (Dr. Lennard): 5 mm sigmoid tubular adenoma.  Recommended repeat in 5 years  - 05/11/2019 colonoscopy for screening purposes excellent prep 3 per hyperplastic polyps 2 to 3 mm size rectosigmoid colon, 8 mm adenomatous polyp in the rectum, diverticulosis sigmoid and transverse, nonbleeding internal hemorrhoids.  Recall 7 years  - 11/07/2021: Evaluation in GI clinic for intermittent BRBPR x2-3 months.  Occasional straining to have BM.  Exam with internal hemorrhoids.  Was treated with Anusol  suppositories and cream, recommended increased fiber, hydration, sitz bath, with plan for hemorrhoid banding if no significant improvement - 01/25/2022: Banding of LL hemorrhoid - 03/19/2022: Banding of RP hemorrhoid - 05/07/2022: Banding of RA hemorrhoid   Separately, longstanding history of GERD for many years, now generally well-controlled with Protonix . Index symptoms of heartburn, regurgitation, belching, waterbrash. No dysphagia. - 02/12/2008: EGD (Dr. Lennard): 4 cm hiatal hernia, otherwise normal.   HPI:     Patient is a 68 y.o. male presenting to the Gastroenterology Clinic for routine follow-up.  Was last seen by me on 02/14/2023.  Reflux well-controlled with Protonix  40 mg twice daily.  Will use Pepcid  OTC on demand if breakthrough symptoms (heartburn).  Otherwise no active issues or concerns today. No dysphagia, abdominal pain.    Review of systems:     No chest pain, no SOB, no fevers, no urinary sx   Past Medical History:  Diagnosis Date   Acid reflux    Allergy    Anemia    Anxiety    Basal cell carcinoma (BCC) of nasal tip    Clotting disorder 1984-85   Vonwilinbrance   Coronary artery disease    History of hiatal hernia    Hyperlipidemia     Hypertension    Insomnia    RLS (restless legs syndrome)    Sensorineural hearing loss    Von Willebrand disease (HCC)    PT STATES THIS IS MILD AND HAS NEVER HAD TO SEE HEMATOLOGIST-PT DID SAY THAT WHEN HE WAS [place] IN [place] YE[place] AGO HE BLED ALOT BUT IT WAS DUE TO NOT FOLLOWING POST OP INSTRUCTIONS     Patient's surgical history, family medical history, social history, medications and allergies were all reviewed in Epic    Current Outpatient Medications  Medication Sig Dispense Refill   albuterol  (VENTOLIN  HFA) 108 (90 Base) MCG/ACT inhaler Inhale 2 puffs into the lungs every 6 (six) hours as needed for wheezing or shortness of breath. 8 g 0   amLODipine  (NORVASC ) 5 MG tablet TAKE 1 TABLET DAILY (FOLLOW UP DUE IN AUGUST WITH LABS) 90 tablet 3   EPINEPHrine  0.3 mg/0.3 mL IJ SOAJ injection INJECT 0.3 ML (0.3 MG) INTO THE MUSCLE ONCE FOR 1 DOSE 2 each 2   fluticasone  (FLONASE ) 50 MCG/ACT nasal spray USE 1 SPRAY IN EACH NOSTRIL DAILY 16 g 5   lidocaine  (LIDODERM ) 5 % Place onto the skin.     lidocaine -prilocaine  (EMLA ) cream 1 gram qid prn 30 g 5   losartan  (COZAAR ) 50 MG tablet Take 1 tablet (50 mg total) by mouth daily. 90 tablet 3   pantoprazole  (PROTONIX ) 40 MG tablet TAKE 1 TABLET TWICE A DAY 180 tablet 3   rosuvastatin  (CRESTOR ) 5 MG tablet TAKE 1 TABLET DAILY 90 tablet 3  Tiotropium Bromide-Olodaterol (STIOLTO RESPIMAT ) 2.5-2.5 MCG/ACT AERS Inhale 2 puffs into the lungs daily. 12 g 4   triamcinolone  cream (KENALOG ) 0.1 % Apply 1 Application topically 2 (two) times daily. 15 g 2   No current facility-administered medications for this visit.    Physical Exam:     BP (!) 100/54   Pulse (!) 57   Ht 5' 6 (1.676 m)   Wt 170 lb 2 oz (77.2 kg)   BMI 27.46 kg/m   GENERAL:  Pleasant male in NAD PSYCH: : Cooperative, normal affect Musculoskeletal:  Normal muscle tone, normal strength NEURO: Alert and oriented x 3, no focal neurologic deficits   IMPRESSION and PLAN:     1) GERD 2) Heartburn 3) Regurgitation 4) Hiatal hernia 68 year old male with longstanding history of GERD, characterized by heartburn, regurgitation, waterbrash, belching.  Reflux symptoms currently well controlled with high-dose PPI with occasional Pepcid  for breakthrough (takes rarely). - Continue Protonix  40 mg twice daily - Continue Pepcid  complete OTC on demand if breakthrough - Continue antireflux lifestyle/dietary modifications     5) History of colon polyps - Repeat colonoscopy in 2028 for ongoing polyp surveillance         RTC in 1 year or sooner prn          Sandor GAILS Jacquis Paxton ,DO, FACG 02/09/2024, 3:26 PM

## 2024-02-09 NOTE — Progress Notes (Unsigned)
   LILLETTE Ileana Collet, PhD, LAT, ATC acting as a scribe for [name], MD.  Grant [name] is a 68 y.o. male who presents to [facility] today for f/u R shoulder pain w/ MRI review. Pt was last seen by Dr. Lloyd on 01/15/24 and a MRI was ordered.  Today, pt reports ***  Dx testing: 01/27/24 R shoulder MRI  Pertinent review of systems: ***  Relevant historical information: ***   Exam:  There were no vitals taken for this visit. General: Well Developed, well nourished, and in no acute distress.   MSK: ***    Lab and Radiology Results No results found for this or any previous visit (from the past 72 hours). No results found.     Assessment and Plan: 68 y.o. male with ***   PDMP not reviewed this encounter. No orders of the defined types were placed in this encounter.  No orders of the defined types were placed in this encounter.    Discussed warning signs or symptoms. Please see discharge instructions. Patient expresses understanding.   ***

## 2024-02-10 ENCOUNTER — Other Ambulatory Visit: Payer: Self-pay

## 2024-02-10 ENCOUNTER — Encounter: Payer: Self-pay | Admitting: Family Medicine

## 2024-02-10 ENCOUNTER — Ambulatory Visit: Admitting: Family Medicine

## 2024-02-10 VITALS — BP 122/64 | HR 73 | Ht 66.0 in | Wt 170.0 lb

## 2024-02-10 DIAGNOSIS — G8929 Other chronic pain: Secondary | ICD-10-CM | POA: Diagnosis not present

## 2024-02-10 DIAGNOSIS — M25511 Pain in right shoulder: Secondary | ICD-10-CM | POA: Diagnosis not present

## 2024-02-10 NOTE — Patient Instructions (Signed)
 Thank you for coming in today.   You received an injection today. Seek immediate medical attention if the joint becomes red, extremely painful, or is oozing fluid.   See you back as needed.

## 2024-02-13 ENCOUNTER — Ambulatory Visit: Admitting: Nurse Practitioner

## 2024-02-13 VITALS — BP 128/60 | HR 75 | Temp 99.2°F | Ht 66.0 in | Wt 164.1 lb

## 2024-02-13 DIAGNOSIS — G8929 Other chronic pain: Secondary | ICD-10-CM | POA: Diagnosis not present

## 2024-02-13 DIAGNOSIS — I700 Atherosclerosis of aorta: Secondary | ICD-10-CM | POA: Diagnosis not present

## 2024-02-13 DIAGNOSIS — M549 Dorsalgia, unspecified: Secondary | ICD-10-CM | POA: Diagnosis not present

## 2024-02-13 NOTE — Patient Instructions (Signed)
 Come back 1 week before visit to lab when fasting.

## 2024-02-13 NOTE — Assessment & Plan Note (Signed)
 Atherosclerotic cardiovascular disease and hyperlipidemia Off rosuvastatin  due to memory concerns with no significant improvement. Discussed Zetia  and Repatha as alternatives. Emphasized cholesterol control to prevent plaque progression. - Schedule fasting blood work in six weeks to assess cholesterol levels. - Discuss treatment options based on cholesterol levels, including Zetia  and Repatha. - Follow up in six weeks to review blood work and treatment plan.

## 2024-02-13 NOTE — Assessment & Plan Note (Signed)
  Chronic back pain Significant improvement with acupuncture and chiropractic care. - Continue acupuncture and chiropractic care.

## 2024-02-13 NOTE — Progress Notes (Signed)
 Established Patient Office Visit  Subjective   Patient ID: DRACEN REIGLE, male    DOB: 07/04/1955  Age: 68 y.o. MRN: [id number]  Chief Complaint  Patient presents with   Hyperlipidemia    Discussed the use of AI scribe software for clinical note transcription with the patient, who gave verbal consent to proceed.  History of Present Illness RAHIM ASTORGA is a 68 year old male who presents for follow-up for back pain, aortic atherosclerosis and HLD.  Chronic back pain - Chronic back pain managed with physical therapy, chiropractic care, and acupuncture. - Acupuncture improved back pain after one session. - Chiropractic care ongoing.  Medication effects - Following with cardiology. - Discontinued rosuvastatin  to assess impact on memory after discussion of possible side effects. - CAC score 122, 56th percentile without significant blockages - Last LDL 51  Cardiac symptoms - No recent chest pain.      ROS: see HPI    Objective:     BP 128/60   Pulse 75   Temp 99.2 F (37.3 C) (Temporal)   Ht 5' 6 (1.676 m)   Wt 164 lb 2 oz (74.4 kg)   SpO2 96%   BMI 26.49 kg/m    Physical Exam Vitals reviewed.  Constitutional:      Appearance: Normal appearance.  HENT:     Head: Normocephalic and atraumatic.  Cardiovascular:     Rate and Rhythm: Normal rate and regular rhythm.  Pulmonary:     Effort: Pulmonary effort is normal.     Breath sounds: Normal breath sounds.  Musculoskeletal:     Cervical back: Neck supple.  Skin:    General: Skin is warm and dry.  Neurological:     Mental Status: He is alert and oriented to person, place, and time.  Psychiatric:        Mood and Affect: Mood normal.        Behavior: Behavior normal.        Thought Content: Thought content normal.        Judgment: Judgment normal.      No results found for any visits on 02/13/24.    The ASCVD Risk score (Arnett DK, et al., 2019) failed to calculate for the following reasons:    The valid total cholesterol range is 130 to 320 mg/dL    Assessment & Plan:   Problem List Items Addressed This Visit       Cardiovascular and Mediastinum   Aortic atherosclerosis - Primary   Atherosclerotic cardiovascular disease and hyperlipidemia Off rosuvastatin  due to memory concerns with no significant improvement. Discussed Zetia  and Repatha as alternatives. Emphasized cholesterol control to prevent plaque progression. - Schedule fasting blood work in six weeks to assess cholesterol levels. - Discuss treatment options based on cholesterol levels, including Zetia  and Repatha. - Follow up in six weeks to review blood work and treatment plan.      Relevant Orders   Comprehensive metabolic panel with GFR   CBC   Lipid panel     Other   Chronic back pain    Chronic back pain Significant improvement with acupuncture and chiropractic care. - Continue acupuncture and chiropractic care.      Assessment and Plan Assessment & Plan Atherosclerotic cardiovascular disease and hyperlipidemia Off rosuvastatin  due to memory concerns with no significant improvement. Discussed Zetia  and Repatha as alternatives. Emphasized cholesterol control to prevent plaque progression. - Schedule fasting blood work in six weeks to assess cholesterol levels. - Discuss treatment  options based on cholesterol levels, including Zetia  and Repatha. - Follow up in six weeks to review blood work and treatment plan.  Chronic back pain Significant improvement with acupuncture and chiropractic care. - Continue acupuncture and chiropractic care.     Return in about 6 weeks (around 03/26/2024) for F/U with Koreena Joost.    Lauraine FORBES Pereyra, NP

## 2024-02-16 ENCOUNTER — Ambulatory Visit: Admitting: Family Medicine

## 2024-02-18 ENCOUNTER — Encounter (HOSPITAL_BASED_OUTPATIENT_CLINIC_OR_DEPARTMENT_OTHER): Payer: Self-pay | Admitting: Cardiovascular Disease

## 2024-02-18 ENCOUNTER — Encounter: Payer: Self-pay | Admitting: Nurse Practitioner

## 2024-02-18 DIAGNOSIS — R0609 Other forms of dyspnea: Secondary | ICD-10-CM

## 2024-02-19 NOTE — Telephone Encounter (Signed)
 Please advise not sure met scores are

## 2024-02-19 NOTE — Telephone Encounter (Signed)
 He does not have a formal diagnosis of COPD based on his prior PFT. He had very mild emphysema on prior CT imaging which was why he was started on Stiolto but his PFTs were normal. [facility] can discuss with [facility] regarding his scoring. Thanks!

## 2024-02-23 ENCOUNTER — Telehealth (HOSPITAL_COMMUNITY): Payer: Self-pay

## 2024-02-23 NOTE — Telephone Encounter (Signed)
 Spoke with the patient, detailed instructions given. Grant [name] CCT

## 2024-02-27 ENCOUNTER — Ambulatory Visit (HOSPITAL_COMMUNITY)
Admission: RE | Admit: 2024-02-27 | Discharge: 2024-02-27 | Disposition: A | Discharge status: Home / Self Care | Source: Ambulatory Visit | Attending: Cardiovascular Disease | Admitting: Cardiovascular Disease

## 2024-02-27 DIAGNOSIS — R0609 Other forms of dyspnea: Secondary | ICD-10-CM | POA: Diagnosis not present

## 2024-02-27 LAB — EXERCISE TOLERANCE TEST
Angina Index: 0
Duke Treadmill Score: -3
Estimated workload: 809
Exercise duration (min): 7 min
Exercise duration (sec): 16 s
MPHR: 151 {beats}/min
Peak HR: 153 {beats}/min
Percent HR: 101 %
Rest HR: 82 {beats}/min
ST Depression (mm): 2 mm

## 2024-02-29 ENCOUNTER — Ambulatory Visit: Payer: Self-pay | Admitting: Cardiovascular Disease

## 2024-03-02 NOTE — Telephone Encounter (Signed)
 You can summarize this in a letter He does not have a formal diagnosis of COPD based on his prior PFT. He has very mild emphysema on prior CT imaging Thanks!

## 2024-03-02 NOTE — Telephone Encounter (Signed)
 Grant [name],  Is there anything in writing that we can provide to the patient regarding his diagnosis?  Please advise.  Thank you.

## 2024-03-05 ENCOUNTER — Emergency Department (HOSPITAL_COMMUNITY)

## 2024-03-05 ENCOUNTER — Emergency Department (HOSPITAL_COMMUNITY): Admission: EM | Admit: 2024-03-05 | Discharge: 2024-03-05 | Disposition: A | Discharge status: Home / Self Care

## 2024-03-05 ENCOUNTER — Other Ambulatory Visit: Payer: Self-pay

## 2024-03-05 ENCOUNTER — Encounter (HOSPITAL_COMMUNITY): Payer: Self-pay

## 2024-03-05 DIAGNOSIS — Z79899 Other long term (current) drug therapy: Secondary | ICD-10-CM | POA: Diagnosis not present

## 2024-03-05 DIAGNOSIS — I10 Essential (primary) hypertension: Secondary | ICD-10-CM | POA: Insufficient documentation

## 2024-03-05 DIAGNOSIS — I2510 Atherosclerotic heart disease of native coronary artery without angina pectoris: Secondary | ICD-10-CM | POA: Diagnosis not present

## 2024-03-05 DIAGNOSIS — R079 Chest pain, unspecified: Secondary | ICD-10-CM | POA: Insufficient documentation

## 2024-03-05 LAB — CBC
HCT: 41.8 % (ref 39.0–52.0)
Hemoglobin: 14.4 g/dL (ref 13.0–17.0)
MCH: 31.7 pg (ref 26.0–34.0)
MCHC: 34.4 g/dL (ref 30.0–36.0)
MCV: 92.1 fL (ref 80.0–100.0)
Platelets: 201 K/uL (ref 150–400)
RBC: 4.54 MIL/uL (ref 4.22–5.81)
RDW: 11.7 % (ref 11.5–15.5)
WBC: 5.6 K/uL (ref 4.0–10.5)
nRBC: 0 % (ref 0.0–0.2)

## 2024-03-05 LAB — BASIC METABOLIC PANEL WITH GFR
Anion gap: 10 (ref 5–15)
BUN: 12 mg/dL (ref 8–23)
CO2: 25 mmol/L (ref 22–32)
Calcium: 9.1 mg/dL (ref 8.9–10.3)
Chloride: 104 mmol/L (ref 98–111)
Creatinine, Ser: 0.87 mg/dL (ref 0.61–1.24)
GFR, Estimated: >60 mL/min
Glucose, Bld: 131 mg/dL — ABNORMAL HIGH (ref 70–99)
Potassium: 3.5 mmol/L (ref 3.5–5.1)
Sodium: 140 mmol/L (ref 135–145)

## 2024-03-05 LAB — TROPONIN T, HIGH SENSITIVITY: Troponin T High Sensitivity: <15 ng/L (ref 0–19)

## 2024-03-05 NOTE — ED Provider Notes (Signed)
 " [place] EMERGENCY DEPARTMENT AT [facility] Provider Note   CSN: [id number] Arrival date & time: 03/05/24  9272     Patient presents with: Chest Pain   Grant [name] is a 68 y.o. male.   This is a 68 year old male presenting emergency department for chest pain.  Symptoms x 3 days.  Reports pressure/tightness sensation across the top of his chest.  Does not radiate.  No aggravating or alleviating factors.  Not worsened with exertion.  Low risk for PE based on Wells criteria.   Chest Pain      Prior to Admission medications  Medication Sig Start Date End Date Taking? Authorizing Provider  albuterol  (VENTOLIN  HFA) 108 (90 Base) MCG/ACT inhaler Inhale 2 puffs into the lungs every 6 (six) hours as needed for wheezing or shortness of breath. 05/09/22   Elnor Lauraine BRAVO, NP  amLODipine  (NORVASC ) 5 MG tablet TAKE 1 TABLET DAILY (FOLLOW UP DUE IN AUGUST WITH LABS) 02/13/24   Elnor Lauraine BRAVO, NP  EPINEPHrine  0.3 mg/0.3 mL IJ SOAJ injection INJECT 0.3 ML (0.3 MG) INTO THE MUSCLE ONCE FOR 1 DOSE 11/25/23   Elnor Lauraine BRAVO, NP  fluticasone  (FLONASE ) 50 MCG/ACT nasal spray USE 1 SPRAY IN EACH NOSTRIL DAILY 10/27/23   Hunsucker, Donnice SAUNDERS, MD  lidocaine  (LIDODERM ) 5 % Place onto the skin. 12/30/23   [provider]  lidocaine -prilocaine  (EMLA ) cream 1 gram qid prn 11/26/23   Onita Duos, MD  losartan  (COZAAR ) 50 MG tablet Take 1 tablet (50 mg total) by mouth daily. 09/10/23   Webb, Padonda B, FNP  pantoprazole  (PROTONIX ) 40 MG tablet TAKE 1 TABLET TWICE A DAY 10/27/23   Cirigliano, Vito V, DO  rosuvastatin  (CRESTOR ) 5 MG tablet TAKE 1 TABLET DAILY 04/16/23   Arida, Muhammad A, MD  Tiotropium Bromide-Olodaterol (STIOLTO RESPIMAT ) 2.5-2.5 MCG/ACT AERS Inhale 2 puffs into the lungs daily. 06/18/23   Hunsucker, Donnice SAUNDERS, MD  triamcinolone  cream (KENALOG ) 0.1 % Apply 1 Application topically 2 (two) times daily. 08/22/22   Elnor Lauraine BRAVO, NP    Allergies: Bee venom, Asa [aspirin], Atorvastatin ,  Duloxetine , Ezetimibe , and Wound dressing adhesive    Review of Systems  Cardiovascular:  Positive for chest pain.    Updated Vital Signs BP 128/69   Pulse (!) 50   Temp 97.8 F (36.6 C) (Oral)   Resp 15   SpO2 100%   Physical Exam Vitals and nursing note reviewed.  Constitutional:      General: He is not in acute distress. Eyes:     Pupils: Pupils are equal, round, and reactive to light.  Cardiovascular:     Rate and Rhythm: Normal rate and regular rhythm.     Pulses:          Radial pulses are 2+ on the right side and 2+ on the left side.       Dorsalis pedis pulses are 2+ on the right side and 2+ on the left side.     Heart sounds: Normal heart sounds.  Pulmonary:     Effort: Pulmonary effort is normal.     Breath sounds: Normal breath sounds. No wheezing, rhonchi or rales.  Chest:     Chest wall: No tenderness.  Abdominal:     Palpations: Abdomen is soft.  Musculoskeletal:     Right lower leg: No edema.     Left lower leg: No edema.  Skin:    General: Skin is warm.     Capillary Refill: Capillary  refill takes less than 2 seconds.  Neurological:     General: No focal deficit present.     Mental Status: He is alert.  Psychiatric:        Mood and Affect: Mood normal.        Behavior: Behavior normal.     (all labs ordered are listed, but only abnormal results are displayed) Labs Reviewed  BASIC METABOLIC PANEL WITH GFR - Abnormal; Notable for the following components:      Result Value   Glucose, Bld 131 (*)    All other components within normal limits  CBC  TROPONIN T, HIGH SENSITIVITY  TROPONIN T, HIGH SENSITIVITY    EKG: EKG Interpretation Date/Time:  Friday March 05 2024 07:57:03 EST Ventricular Rate:  63 PR Interval:  149 QRS Duration:  106 QT Interval:  422 QTC Calculation: 432 R Axis:   30  Text Interpretation: Sinus rhythm Confirmed by Neysa Clap [phone]) on 03/05/2024 8:28:57 AM  Radiology: DG Chest 2 View Result Date:  03/05/2024 CLINICAL DATA:  Chest pain EXAM: CHEST - 2 VIEW COMPARISON:  September 27, 2022 FINDINGS: The heart size and mediastinal contours are within normal limits. Both lungs are clear. The visualized skeletal structures are unremarkable. IMPRESSION: No active cardiopulmonary disease. Electronically Signed   By: Lynwood Landy Raddle M.D.   On: 03/05/2024 08:17     Procedures   Medications Ordered in the ED - No data to display  Clinical Course as of 03/05/24 1631  Fri Mar 05, 2024  9246 Saw cardiology on 11/24: Coronary artery disease without angina Non-destructive coronary artery disease with borderline calcium  score of 122 (56th percentile) on CT scan from 2023. No significant blockages. No history of myocardial infarction or stroke. No current chest pain or dyspnea. - Continue current management and monitoring.   Hypertension Well-controlled with amlodipine  and losartan . - Continue amlodipine  5 mg daily. - Continue losartan  50 mg daily.  [TY]  0829 CBC No leukocytosis to suggest stomach infection.  No anemia. [TY]  [id number] Basic metabolic panel(!) No significant metabolic derangements.  Normal kidney function. [TY]  [id number] Troponin T High Sensitivity: <15 Symptoms been constant for the past 3 days.  Undetectable troponin.  Cardiac etiology/ACS less likely. [TY]  [id number] Given patient's negative workup, feel that he can be further evaluated outpatient by primary doctor and cardiologist. [TY]    Clinical Course User Index [TY] Neysa Clap PARAS, DO                                 Medical Decision Making Is well-appearing 68 year old male presenting emergency department for chest pain.  Past medical history includes hypertension, hyperlipidemia, von Willebrand's disease, CAD.  Afebrile nontachycardic, slightly hypertensive.  EKG my independent review sinus rhythm with no ST segment changes to indicate ischemia.  Does not appear to be in distress.  Recently saw cardiology; see ED course.  Notes he  just had exercise stress test recently, it appears it was normal per my chart review.  I have low suspicion for PE, low risk based on Wells criteria.  Less likely dissection, equal pulses, dull mild pain.  Will get screening labs, chest x-ray and make disposition.  See ED course for further MDM  Amount and/or Complexity of Data Reviewed Labs: ordered. Decision-making details documented in ED Course. Radiology: ordered and independent interpretation performed. Decision-making details documented in ED Course. ECG/medicine tests: independent interpretation performed.  Risk Decision  regarding hospitalization. Diagnosis or treatment significantly limited by social determinants of health.       Final diagnoses:  Chest pain, unspecified type    ED Discharge Orders     None          Neysa Caron PARAS, DO 03/05/24 1631  "

## 2024-03-05 NOTE — ED Triage Notes (Signed)
 Pt c/o CP x 3 days; generalized, pressure, associated lightheadedness; denies N/V, sob; pt states he had a stress test last week and has been having PT for a torn rotator cuff

## 2024-03-05 NOTE — ED Notes (Signed)
 Pt gives verbal consent for mse

## 2024-03-05 NOTE — Discharge Instructions (Signed)
 Please follow-up with your primary doctor and your cardiologist.  Return immediately for fevers, chills, worsening chest pain, difficulty breathing, or any new or worsening symptoms that are concerning to you.

## 2024-03-06 ENCOUNTER — Encounter (HOSPITAL_BASED_OUTPATIENT_CLINIC_OR_DEPARTMENT_OTHER): Payer: Self-pay | Admitting: Cardiovascular Disease

## 2024-03-12 NOTE — Progress Notes (Signed)
 " Cardiology Office Note:  .   Date:  03/16/2024  ID:  Grant [name], DOB Mar 25, 1955, MRN [id number] PCP: Barbaraann Darryle Ned, MD  [facility] Providers Cardiologist:  None   History of Present Illness: .    Chief Complaint  Patient presents with   Follow-up         Grant [name] is a 69 y.o. male with below history who presents for follow-up.   History of Present Illness   Grant [name] is a 69 year old male with nonobstructive coronary artery disease who presents for follow-up after an ER visit for chest tightness.  He experienced tightness in his upper chest for about three days, starting a couple of days before a Friday, approximately a week after undergoing a stress test. The sensation was described as a steady tightness, not associated with any specific activities or changes in routine. He visited the ER, where blood work and x-rays were performed, and he was informed that there were no indications of heart issues. The chest tightness resolved on its own a day or two later. No current chest tightness or pain.  He has a history of nonobstructive coronary artery disease with a moderate mid LAD stenosis and a negative CTFFR. A CT scan approximately three years ago showed coronary calcium  but no blockage. He recently underwent a stress test.  He has been experiencing joint pain and memory issues associated with his hyperlipidemia medications. He has tried various statins, including atorvastatin , rosuvastatin , and ezetimibe , all of which have caused joint pain. He noticed significant improvement in joint pain after discontinuing rosuvastatin  5 mg daily for 30 days.  He is retired and spends most of his time at home, especially during the colder months. He has a large family and enjoys spending time with his grandchildren and great-grandchildren. He is fully retired with [employer] for [employer], and he is 90% disabled, with recent approval for heart disease coverage through  the [place].           Problem List CAD -CAC 122 (56th percentile) -moderate mLAD (CT FFR 0.84) 2. HLD -T chol 103, HDL 43, LDL 51, TG 43 3. HTN 4. COPD 5. Von Willebrand's disease     ROS: All other ROS reviewed and negative. Pertinent positives noted in the HPI.     Studies Reviewed: .       ETT 02/27/2024   2.0 mm of up sloping ST depression in the inferolateral leads (II, III, aVF, V5 and V6) was noted. Arrhythmias during stress: rare PACs, rare PVCs. ECG was interpretable and conclusive. The ECG was negative for ischemia.   ECG rhythm shows normal sinus rhythm.   Negative adequate stress test without evidence of ischemia at given workload. Occasional PAC's and PVC's with exercise.  Physical Exam:   VS:  BP 120/70   Pulse 83   Ht 5' 6 (1.676 m)   Wt 162 lb 11.2 oz (73.8 kg)   SpO2 96%   BMI 26.26 kg/m    Wt Readings from Last 3 Encounters:  03/16/24 162 lb 11.2 oz (73.8 kg)  02/13/24 164 lb 2 oz (74.4 kg)  02/10/24 170 lb (77.1 kg)    GEN: Well nourished, well developed in no acute distress NECK: No JVD; No carotid bruits CARDIAC: RRR, no murmurs, rubs, gallops RESPIRATORY:  Clear to auscultation without rales, wheezing or rhonchi  ABDOMEN: Soft, non-tender, non-distended EXTREMITIES:  No edema; No deformity  ASSESSMENT AND PLAN: .  Assessment and Plan    Mixed hyperlipidemia Elevated coronary calcium  score and LDL. Intolerant to statins due to joint pain. Discussed PCSK9 inhibitors as alternative therapy. - Take Crestor  5 mg every other day until meeting with the pharmacist. - Referred to pharmacy lipid clinic for PCSK9 inhibitor therapy.  Coronary artery disease without angina Nonobstructive disease with moderate mid LAD stenosis. Recent chest tightness resolved spontaneously. Symptoms likely non-cardiac. - Monitor for recurrence of chest symptoms. - Consider repeating CT scan if symptoms recur.   Chest pain, non-cardiac -current symptoms non-cardiac.  EKG normal. No further work-up.                Follow-up: Return in about 1 year (around 03/16/2025).  Signed, Darryle DASEN. Barbaraann, MD, [facility]  [facility]  [address] [place], [place] [address] [phone]  10:22 AM "

## 2024-03-16 ENCOUNTER — Encounter: Payer: Self-pay | Admitting: Cardiovascular Disease

## 2024-03-16 ENCOUNTER — Ambulatory Visit: Attending: Cardiovascular Disease | Admitting: Cardiovascular Disease

## 2024-03-16 VITALS — BP 120/70 | HR 83 | Ht 66.0 in | Wt 162.7 lb

## 2024-03-16 DIAGNOSIS — I2510 Atherosclerotic heart disease of native coronary artery without angina pectoris: Secondary | ICD-10-CM | POA: Diagnosis not present

## 2024-03-16 DIAGNOSIS — I150 Renovascular hypertension: Secondary | ICD-10-CM | POA: Diagnosis not present

## 2024-03-16 DIAGNOSIS — E782 Mixed hyperlipidemia: Secondary | ICD-10-CM | POA: Insufficient documentation

## 2024-03-16 MED ORDER — ROSUVASTATIN CALCIUM 5 MG PO TABS: 5.0000 mg | ORAL_TABLET | ORAL | 3 refills | Status: AC

## 2024-03-16 NOTE — Addendum Note (Signed)
 Addended by: RANDY HAMP SAILOR on: 03/16/2024 10:36 AM   Modules accepted: Orders

## 2024-03-16 NOTE — Patient Instructions (Signed)
 Medication Instructions:  Your physician has recommended you make the following change in your medication:  TAKE: rosuvastatin  (Crestor ) 5 mg by mouth every other day  *If you need a refill on your cardiac medications before your next appointment, please call your pharmacy*  Lab Work: NONE    Testing/Procedures: Your physician has referred you to the Pharmacy Clinic for Hyperlipidemia.   Follow-Up: At [facility], you and your health needs are our priority.  As part of our continuing mission to provide you with exceptional heart care, our providers are all part of one team.  This team includes your primary Cardiologist (physician) and Advanced Practice Providers or APPs (Physician Assistants and Nurse Practitioners) who all work together to provide you with the care you need, when you need it.  Your next appointment:   1 year(s)  Provider:   One of our Advanced Practice Providers (APPs): Morse Clause, PA-C  Lamarr Satterfield, NP Miriam Shams, NP  Olivia Pavy, PA-C Josefa Beauvais, NP  Leontine Salen, PA-C Orren Fabry, PA-C  [place], PA-C Ernest Dick, NP  Damien Braver, NP Jon Hails, PA-C  Waddell Donath, PA-C    Dayna Dunn, PA-C  Scott Weaver, PA-C Lum Louis, NP Katlyn West, NP Callie Goodrich, PA-C  Xika Zhao, NP Sheng Haley, PA-C    Kathleen Johnson, PA-C

## 2024-03-19 ENCOUNTER — Ambulatory Visit (HOSPITAL_BASED_OUTPATIENT_CLINIC_OR_DEPARTMENT_OTHER): Admitting: Cardiovascular Disease

## 2024-03-31 ENCOUNTER — Ambulatory Visit: Payer: Medicare Other

## 2024-03-31 ENCOUNTER — Other Ambulatory Visit: Payer: Self-pay

## 2024-03-31 VITALS — Ht 66.0 in | Wt 162.0 lb

## 2024-03-31 DIAGNOSIS — Z122 Encounter for screening for malignant neoplasm of respiratory organs: Secondary | ICD-10-CM

## 2024-03-31 DIAGNOSIS — Z0000 Encounter for general adult medical examination without abnormal findings: Secondary | ICD-10-CM | POA: Diagnosis not present

## 2024-03-31 DIAGNOSIS — Z87891 Personal history of nicotine dependence: Secondary | ICD-10-CM

## 2024-03-31 NOTE — Progress Notes (Addendum)
 "  Chief Complaint  Patient presents with   Medicare Wellness     Subjective:  Please attest and cosign this visit due to patients primary care provider not being in the office at the time the visit was completed.  (Pt of Lauraine Pereyra, NP)   Grant [name] is a 69 y.o. male who presents for a Medicare Annual Wellness Visit.  Visit info / Clinical Intake: Medicare Wellness Visit Type:: Subsequent Annual Wellness Visit Persons participating in visit and providing information:: patient Medicare Wellness Visit Mode:: Video Since this visit was completed virtually, some vitals may be partially provided or unavailable. Missing vitals are due to the limitations of the virtual format.: Documented vitals are patient reported If Telephone or Video please confirm:: I connected with patient using audio/video enable telemedicine. I verified patient identity with two identifiers, discussed telehealth limitations, and patient agreed to proceed. Patient Location:: HOME Provider Location:: OFFICE Interpreter Needed?: No Pre-visit prep was completed: yes AWV questionnaire completed by patient prior to visit?: yes Date:: 03/31/24 Living arrangements:: lives with spouse/significant other Patient's Overall Health Status Rating: (!) fair Typical amount of pain: (!) a lot (rating at 2 - right shoulder pain) Does pain affect daily life?: (!) yes (medical care via [facility]) Are you currently prescribed opioids?: no  Dietary Habits and Nutritional Risks How many meals a day?: 2 Eats fruit and vegetables daily?: (!) no Most meals are obtained by: preparing own meals; eating out In the last 2 weeks, have you had any of the following?: none Diabetic:: no  Functional Status Activities of Daily Living (to include ambulation/medication): Independent Ambulation: Independent with device- listed below Home Assistive Devices/Equipment: Eyeglasses; Cane Medication Administration: Independent Home Management  (perform basic housework or laundry): Independent Manage your own finances?: yes Primary transportation is: driving Concerns about vision?: no *vision screening is required for WTM* Concerns about hearing?: (!) yes Uses hearing aids?: (!) yes Hear whispered voice?: yes (VIDEO VISIT)  Fall Screening Falls in the past year?: 1 Number of falls in past year: 1 (2) Was there an injury with Fall?: 0 Fall Risk Category Calculator: 2 Patient Fall Risk Level: Moderate Fall Risk  Fall Risk Patient at Risk for Falls Due to: Other (Comment) (No injury) Fall risk Follow up: Falls evaluation completed; Falls prevention discussed  Home and Transportation Safety: All rugs have non-skid backing?: yes All stairs or steps have railings?: yes Grab bars in the bathtub or shower?: yes Have non-skid surface in bathtub or shower?: (!) no Good home lighting?: yes Regular seat belt use?: yes Hospital stays in the last year:: no  Cognitive Assessment Difficulty concentrating, remembering, or making decisions? : yes Will 6CIT or Mini Cog be Completed: yes What year is it?: 0 points What month is it?: 0 points Give patient an address phrase to remember (5 components): [address] [place], [place] About what time is it?: 0 points Count backwards from 20 to 1: 0 points Say the months of the year in reverse: 0 points Repeat the address phrase from earlier: 2 points (Drive) 6 CIT Score: 2 points  Advance Directives (For Healthcare) Does Patient Have a Medical Advance Directive?: No Would patient like information on creating a medical advance directive?: No - Patient declined  Reviewed/Updated  Reviewed/Updated: Reviewed All (Medical, Surgical, Family, Medications, Allergies, Care Teams, Patient Goals)    Allergies (verified) Bee venom, Asa [aspirin], Atorvastatin , Duloxetine , Ezetimibe , and Wound dressing adhesive   Current Medications (verified) Outpatient Encounter Medications as of 03/31/2024  Medication Sig   albuterol  (VENTOLIN  HFA) 108 (90 Base) MCG/ACT inhaler Inhale 2 puffs into the lungs every 6 (six) hours as needed for wheezing or shortness of breath.   amLODipine  (NORVASC ) 5 MG tablet TAKE 1 TABLET DAILY (FOLLOW UP DUE IN AUGUST WITH LABS)   EPINEPHrine  0.3 mg/0.3 mL IJ SOAJ injection INJECT 0.3 ML (0.3 MG) INTO THE MUSCLE ONCE FOR 1 DOSE   fluticasone  (FLONASE ) 50 MCG/ACT nasal spray USE 1 SPRAY IN EACH NOSTRIL DAILY   lidocaine  (LIDODERM ) 5 % Place onto the skin.   lidocaine -prilocaine  (EMLA ) cream 1 gram qid prn   losartan  (COZAAR ) 50 MG tablet Take 1 tablet (50 mg total) by mouth daily.   pantoprazole  (PROTONIX ) 40 MG tablet TAKE 1 TABLET TWICE A DAY   rosuvastatin  (CRESTOR ) 5 MG tablet Take 1 tablet (5 mg total) by mouth every other day.   Tiotropium Bromide-Olodaterol (STIOLTO RESPIMAT ) 2.5-2.5 MCG/ACT AERS Inhale 2 puffs into the lungs daily.   triamcinolone  cream (KENALOG ) 0.1 % Apply 1 Application topically 2 (two) times daily.   No facility-administered encounter medications on file as of 03/31/2024.    History: Past Medical History:  Diagnosis Date   Acid reflux    Allergy    Anemia    Anxiety    Basal cell carcinoma (BCC) of nasal tip    Clotting disorder 1984-85   Vonwilinbrance   Coronary artery disease    History of hiatal hernia    Hyperlipidemia    Hypertension    Insomnia    RLS (restless legs syndrome)    Sensorineural hearing loss    Von Willebrand disease (HCC)    PT STATES THIS IS MILD AND HAS NEVER HAD TO SEE HEMATOLOGIST-PT DID SAY THAT WHEN HE WAS [place] HE BLED ALOT BUT IT WAS DUE TO NOT FOLLOWING POST OP INSTRUCTIONS    Past Surgical History:  Procedure Laterality Date   CIRCUMCISION     AS AN ADULT   COLONOSCOPY     FOOT SURGERY Right    arch support   HERNIA REPAIR     INCISION AND DRAINAGE Right 02/04/2013   Procedure: INCISION AND DRAINAGE;  Surgeon: Prentice LELON Pagan, MD;  Location: [facility] OR;   Service: Orthopedics;  Laterality: Right;   INGUINAL HERNIA REPAIR Bilateral 05/13/2018   Procedure: LAPAROSCOPIC BILATERAL INGUINAL HERNIA REPAIR;  Surgeon: Desiderio Schanz, MD;  Location: [facility] ORS;  Service: General;  Laterality: Bilateral;   INGUINAL HERNIA REPAIR Right 06/17/2019   Procedure: HERNIA REPAIR INGUINAL ADULT WITH MESH-- Recurrent;  Surgeon: Desiderio Schanz, MD;  Location: [facility] ORS;  Service: General;  Laterality: Right;   OPEN REDUCTION INTERNAL FIXATION (ORIF) FINGER WITH RADIAL BONE GRAFT Right 02/04/2013   Procedure: OPEN REDUCTION INTERNAL FIXATION (ORIF) right ring and small fingers;  Surgeon: Prentice LELON Pagan, MD;  Location: [facility] OR;  Service: Orthopedics;  Laterality: Right;   Family History  Problem Relation Age of Onset   Hypertension Mother    Hyperlipidemia Mother    Hypertension Father    Hyperlipidemia Father    Stroke Father        x2   Colon cancer Neg Hx    Esophageal cancer Neg Hx    Rectal cancer Neg Hx    Stomach cancer Neg Hx    Sleep apnea Neg Hx    Neuropathy Neg Hx    Pancreatic cancer Neg Hx    Social History   Occupational History   Occupation: Copywriter, Advertising  Occupation: retired    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  Tobacco Use   Smoking status: Former    Current packs/day: 0.00    Average packs/day: 0.5 packs/day for 40.0 years (20.0 ttl pk-yrs)    Types: Cigarettes    Start date: 05/10/1976    Quit date: 05/10/2016    Years since quitting: 7.8   Smokeless tobacco: Never  Vaping Use   Vaping status: Former   Quit date: 05/10/2016  Substance and Sexual Activity   Alcohol use: No   Drug use: Yes    Types: Marijuana    Comment: uses daily   Sexual activity: Yes   Tobacco Counseling Counseling given: Yes  SDOH Screenings   Food Insecurity: No Food Insecurity (03/31/2024)  Recent Concern: Food Insecurity - Food Insecurity Present (02/09/2024)  Housing: Unknown (03/31/2024)  Transportation Needs: No Transportation Needs (03/31/2024)   Utilities: Not At Risk (03/31/2024)  Alcohol Screen: Low Risk (02/09/2024)  Depression (PHQ2-9): Low Risk (03/31/2024)  Recent Concern: Depression (PHQ2-9) - Medium Risk (02/13/2024)  Financial Resource Strain: Medium Risk (02/09/2024)  Physical Activity: Insufficiently Active (03/31/2024)  Social Connections: Moderately Integrated (03/31/2024)  Stress: Stress Concern Present (03/31/2024)  Tobacco Use: Medium Risk (03/31/2024)  Health Literacy: Adequate Health Literacy (03/31/2024)   See flowsheets for full screening details  Depression Screen PHQ 2 & 9 Depression Scale- Over the past 2 weeks, how often have you been bothered by any of the following problems? Little interest or pleasure in doing things: 0 Feeling down, depressed, or hopeless (PHQ Adolescent also includes...irritable): 1 PHQ-2 Total Score: 1 Trouble falling or staying asleep, or sleeping too much: 2 (rt shoulder pain) Feeling tired or having little energy: 0 Poor appetite or overeating (PHQ Adolescent also includes...weight loss): 0 Feeling bad about yourself - or that you are a failure or have let yourself or your family down: 0 Trouble concentrating on things, such as reading the newspaper or watching television (PHQ Adolescent also includes...like school work): 0 Moving or speaking so slowly that other people could have noticed. Or the opposite - being so fidgety or restless that you have been moving around a lot more than usual: 0 Thoughts that you would be better off dead, or of hurting yourself in some way: 0 PHQ-9 Total Score: 3 If you checked off any problems, how difficult have these problems made it for you to do your work, take care of things at home, or get along with other people?: Not difficult at all  Depression Treatment Depression Interventions/Treatment : [facility] Provided     Goals Addressed               This Visit's Progress     Patient Stated (pt-stated)        Patient stated he plans  to stay on top of health             Objective:    Today's Vitals   03/31/24 0813  Weight: 162 lb (73.5 kg)  Height: 5' 6 (1.676 m)  PainSc: 2   PainLoc: Shoulder   Body mass index is 26.15 kg/m.  Hearing/Vision screen Hearing Screening - Comments:: Waars hearing aids Vision Screening - Comments:: Wears rx glasses - up to date with routine eye exams with the [facility] in [place], [place] Immunizations and Health Maintenance Health Maintenance  Topic Date Due   Zoster Vaccines- Shingrix (1 of 2) Never done   DTaP/Tdap/Td (2 - Td or Tdap) 02/05/2023   Lung Cancer  Screening  09/27/2023   COVID-19 Vaccine (1 - 2025-26 season) Never done   Influenza Vaccine  06/08/2024 (Originally 10/10/2023)   Pneumococcal Vaccine: 50+ Years (1 of 2 - PCV) 09/07/2024 (Originally [birth year]01/1974)   Medicare Annual Wellness (AWV)  03/31/2025   Colonoscopy  05/11/2026   Hepatitis C Screening  Completed   Meningococcal B Vaccine  Aged Out        Assessment/Plan:  This is a routine wellness examination for Grant [name].  I have recommended that this patient have a immunization for Influenza, Shingles, and Pneumonia but he declines at this time. I have discussed the risks and benefits of this procedure with him. The patient verbalizes understanding. Pt is considering getting the Tdap vaccine at the local pharmacy.  Patient Care Team: O'Neal, Darryle Ned, MD as PCP - General (Cardiology) Desiderio Schanz, MD as Consulting Physician (General Surgery) Tat, Asberry RAMAN, DO as Consulting Physician (Neurology) Hunsucker, Donnice SAUNDERS, MD as Consulting Physician (Pulmonary Disease)  I have personally reviewed and noted the following in the patients chart:   Medical and social history Use of alcohol, tobacco or illicit drugs  Current medications and supplements including opioid prescriptions. Functional ability and status Nutritional status Physical activity Advanced directives List of other  physicians Hospitalizations, surgeries, and ER visits in previous 12 months Vitals Screenings to include cognitive, depression, and falls Referrals and appointments  Orders Placed This Encounter  Procedures   Ambulatory Referral for Lung Cancer Scre    Referral Priority:   Routine    Referral Type:   Consultation    Referral Reason:   Specialty Services Required    Referred to Provider:   Ruthell Lauraine FALCON, NP    Number of Visits Requested:   1   In addition, I have reviewed and discussed with patient certain preventive protocols, quality metrics, and best practice recommendations. A written personalized care plan for preventive services as well as general preventive health recommendations were provided to patient.   Verdie CHRISTELLA Saba, CMA   03/31/2024   Return in 1 year (on 03/31/2025).  After Visit Summary: (MyChart) Due to this being a telephonic visit, the after visit summary with patients personalized plan was offered to patient via MyChart   Nurse Notes: scheduled 2027 AWV appt "

## 2024-03-31 NOTE — Patient Instructions (Signed)
 Grant [name],  Thank you for taking the time for your Medicare Wellness Visit. I appreciate your continued commitment to your health goals. Please review the care plan we discussed, and feel free to reach out if I can assist you further.  Please note that Annual Wellness Visits do not include a physical exam. Some assessments may be limited, especially if the visit was conducted virtually. If needed, we may recommend an in-person follow-up with your provider.  Ongoing Care Seeing your primary care provider every 3 to 6 months helps us  monitor your health and provide consistent, personalized care.   Referrals If a referral was made during today's visit and you haven't received any updates within two weeks, please contact the referred provider directly to check on the status.  Recommended Screenings:  Health Maintenance  Topic Date Due   Zoster (Shingles) Vaccine (1 of 2) Never done   DTaP/Tdap/Td vaccine (2 - Td or Tdap) 02/05/2023   Screening for Lung Cancer  09/27/2023   Flu Shot  Never done   COVID-19 Vaccine (1 - 2025-26 season) Never done   Medicare Annual Wellness Visit  03/30/2024   Pneumococcal Vaccine for age over 27 (1 of 2 - PCV) 09/07/2024*   Colon Cancer Screening  05/11/2026   Hepatitis C Screening  Completed   Meningitis B Vaccine  Aged Out  *Topic was postponed. The date shown is not the original due date.       03/31/2024    8:15 AM  Advanced Directives  Does Patient Have a Medical Advance Directive? No  Would patient like information on creating a medical advance directive? No - Patient declined    Vision: Annual vision screenings are recommended for early detection of glaucoma, cataracts, and diabetic retinopathy. These exams can also reveal signs of chronic conditions such as diabetes and high blood pressure.  Dental: Annual dental screenings help detect early signs of oral cancer, gum disease, and other conditions linked to overall health, including heart  disease and diabetes.

## 2024-04-08 ENCOUNTER — Other Ambulatory Visit

## 2024-04-08 DIAGNOSIS — I700 Atherosclerosis of aorta: Secondary | ICD-10-CM

## 2024-04-08 LAB — CBC
HCT: 43.3 % (ref 39.0–52.0)
Hemoglobin: 14.7 g/dL (ref 13.0–17.0)
MCHC: 33.9 g/dL (ref 30.0–36.0)
MCV: 92.7 fl (ref 78.0–100.0)
Platelets: 234 10*3/uL (ref 150.0–400.0)
RBC: 4.66 Mil/uL (ref 4.22–5.81)
RDW: 12.5 % (ref 11.5–15.5)
WBC: 6.1 10*3/uL (ref 4.0–10.5)

## 2024-04-08 LAB — COMPREHENSIVE METABOLIC PANEL WITH GFR
ALT: 20 U/L (ref 3–53)
AST: 16 U/L (ref 5–37)
Albumin: 4.2 g/dL (ref 3.5–5.2)
Alkaline Phosphatase: 89 U/L (ref 39–117)
BUN: 18 mg/dL (ref 6–23)
CO2: 32 meq/L (ref 19–32)
Calcium: 9.3 mg/dL (ref 8.4–10.5)
Chloride: 104 meq/L (ref 96–112)
Creatinine, Ser: 0.82 mg/dL (ref 0.40–1.50)
GFR: 89.36 mL/min
Glucose, Bld: 100 mg/dL — ABNORMAL HIGH (ref 70–99)
Potassium: 4.9 meq/L (ref 3.5–5.1)
Sodium: 139 meq/L (ref 135–145)
Total Bilirubin: 0.8 mg/dL (ref 0.2–1.2)
Total Protein: 6.3 g/dL (ref 6.0–8.3)

## 2024-04-08 LAB — LIPID PANEL
Cholesterol: 108 mg/dL (ref 28–200)
HDL: 40.2 mg/dL
LDL Cholesterol: 56 mg/dL (ref 10–99)
NonHDL: 67.83
Total CHOL/HDL Ratio: 3
Triglycerides: 61 mg/dL (ref 10.0–149.0)
VLDL: 12.2 mg/dL (ref 0.0–40.0)

## 2024-04-09 ENCOUNTER — Ambulatory Visit: Admitting: Nurse Practitioner

## 2024-04-09 ENCOUNTER — Ambulatory Visit: Payer: Self-pay | Admitting: Nurse Practitioner

## 2024-04-09 VITALS — BP 134/68 | HR 84 | Temp 98.5°F | Ht 66.0 in | Wt 165.2 lb

## 2024-04-09 DIAGNOSIS — R739 Hyperglycemia, unspecified: Secondary | ICD-10-CM | POA: Insufficient documentation

## 2024-04-09 DIAGNOSIS — R6889 Other general symptoms and signs: Secondary | ICD-10-CM | POA: Insufficient documentation

## 2024-04-09 DIAGNOSIS — G2581 Restless legs syndrome: Secondary | ICD-10-CM

## 2024-04-09 DIAGNOSIS — R413 Other amnesia: Secondary | ICD-10-CM

## 2024-04-09 DIAGNOSIS — I700 Atherosclerosis of aorta: Secondary | ICD-10-CM

## 2024-04-09 DIAGNOSIS — E611 Iron deficiency: Secondary | ICD-10-CM | POA: Diagnosis not present

## 2024-04-09 LAB — CBC
HCT: 41.3 % (ref 39.0–52.0)
Hemoglobin: 14.2 g/dL (ref 13.0–17.0)
MCHC: 34.3 g/dL (ref 30.0–36.0)
MCV: 92.6 fl (ref 78.0–100.0)
Platelets: 226 10*3/uL (ref 150.0–400.0)
RBC: 4.46 Mil/uL (ref 4.22–5.81)
RDW: 12.4 % (ref 11.5–15.5)
WBC: 5.8 10*3/uL (ref 4.0–10.5)

## 2024-04-09 LAB — IRON: Iron: 91 ug/dL (ref 42–165)

## 2024-04-09 LAB — TSH: TSH: 1.59 u[IU]/mL (ref 0.35–5.50)

## 2024-04-09 LAB — FERRITIN: Ferritin: 283 ng/mL (ref 22.0–322.0)

## 2024-04-09 LAB — HEMOGLOBIN A1C: Hgb A1c MFr Bld: 5.9 % (ref 4.6–6.5)

## 2024-04-09 LAB — TRANSFERRIN: Transferrin: 269 mg/dL (ref 212.0–360.0)

## 2024-04-09 NOTE — Assessment & Plan Note (Addendum)
 Chronic, stable  -Pt. Requested a Iron panel to be checked to take to his next appt with his restless leg specialist next month. Will order as requested.

## 2024-04-09 NOTE — Assessment & Plan Note (Signed)
 Stable -Patient has had a fasting glucose on labs ranging 100-136, patient voiced concerns  -Will check a A1C

## 2024-04-09 NOTE — Progress Notes (Signed)
 "  Established Patient Office Visit  Subjective   Patient ID: Grant [name], male    DOB: May 26, 1955  Age: 69 y.o. MRN: [id number]  Chief Complaint  Patient presents with   Medical Management of Chronic Issues    Follow up on labs results     HPI:  Aortic Arherosclerosis  69 year old male presented to office to discuss labs and memory issues. Discussed lipid panel results and pt. Was pleased with results he states that he is taking rosuvastatin  5mg  every other day. Reports a  decrease in muscle aches, States that he memory is no worse or better.   Short term Memory Loss Reports that memory issues are more short term, states he'll go to get something and then forget what he was going to get. Reports that he will usually remember with in a few minutes but it frustrates him. He states that it the memory issues can varies to a few times a week or few times a day.      Review of Systems  Constitutional:  Negative for chills, fever, malaise/fatigue and weight loss.  HENT:  Negative for congestion and sore throat.   Respiratory:  Positive for cough (smoker cough). Negative for shortness of breath and wheezing.   Cardiovascular:  Negative for chest pain, palpitations, claudication and leg swelling.  Musculoskeletal:  Negative for joint pain, myalgias and neck pain.  Neurological:  Negative for dizziness, seizures, weakness and headaches.  Psychiatric/Behavioral:  Positive for memory loss. Negative for depression, hallucinations, substance abuse and suicidal ideas. The patient does not have insomnia.       Objective:     BP 134/68   Pulse 84   Temp 98.5 F (36.9 C) (Temporal)   Ht 5' 6 (1.676 m)   Wt 165 lb 4 oz (75 kg)   SpO2 97%   BMI 26.67 kg/m    Physical Exam Vitals reviewed.  Constitutional:      Appearance: Normal appearance.  Cardiovascular:     Rate and Rhythm: Normal rate and regular rhythm.  Pulmonary:     Effort: Pulmonary effort is normal. No respiratory  distress.     Breath sounds: Normal breath sounds. No wheezing, rhonchi or rales.  Chest:     Chest wall: No tenderness.  Musculoskeletal:     Comments: Pt. Wearing a rotator cuff brace to Rt shoulder across chest  Skin:    General: Skin is warm and dry.  Neurological:     Mental Status: He is alert and oriented to person, place, and time.     Coordination: Coordination normal.     Gait: Gait normal.  Psychiatric:        Mood and Affect: Mood normal.        Behavior: Behavior normal.        Thought Content: Thought content normal.        Judgment: Judgment normal.      No results found for any visits on 04/09/24.  Last lipids Lab Results  Component Value Date   CHOL 108 04/08/2024   HDL 40.20 04/08/2024   LDLCALC 56 04/08/2024   TRIG 61.0 04/08/2024   CHOLHDL 3 04/08/2024      The ASCVD Risk score (Arnett DK, et al., 2019) failed to calculate for the following reasons:   The valid total cholesterol range is 130 to 320 mg/dL    Assessment & Plan:   Problem List Items Addressed This Visit       Cardiovascular  and Mediastinum   Aortic atherosclerosis   Chronic, stable Doing well. Chol. 108 HDL. 40 LDL. 56 Currently taking Rosuvastatin  5mg  every other day  Due to concerns of memory loss Has appt with Cardiology on Feb 13th, 2026  per patient to discuss Repatha.  -Will recheck a TSH and  CBC due to concerns of cont memory issues        Endocrine   Hyperglycemia   Stable -Patient has had a fasting glucose on labs ranging 100-136, patient voiced concerns  -Will check a A1C      Relevant Orders   Hemoglobin A1c     Other   Restless leg syndrome - Primary   Chronic, stable  -Pt. Requested a Iron panel to be checked to take to his next appt with his restless leg specialist next month. Will order as requested.        Relevant Orders   Iron   Ferritin   CBC   Transferrin Saturation   Short-term memory loss   Chronic, Stable - Cont to take rosuvastatin   every other day  -Keep appt with cardiologist to discuss Repatha       Relevant Orders   Transferrin   TSH   Iron deficiency   Chronic, stable  -Pt. Requested a Iron panel checked  due to his history of restless leg syndrome.  - Cont follow-up with restless leg specialist.  -Will order labs as requested.       Relevant Orders   Iron   Ferritin   Transferrin   CBC   Transferrin Saturation   Increased libido   Relevant Orders   Ambulatory referral to Urology    Return in about 6 months (around 10/07/2024) for F/U with Sarah.    Tinnie LITTIE Limes, RN  "

## 2024-04-09 NOTE — Assessment & Plan Note (Signed)
 Chronic, Stable - Cont to take rosuvastatin  every other day  -Keep appt with cardiologist to discuss Repatha

## 2024-04-09 NOTE — Assessment & Plan Note (Signed)
 Chronic, stable Doing well. Chol. 108 HDL. 40 LDL. 56 Currently taking Rosuvastatin  5mg  every other day  Due to concerns of memory loss Has appt with Cardiology on Feb 13th, 2026  per patient to discuss Repatha.  -Will recheck a TSH and  CBC due to concerns of cont memory issues

## 2024-04-09 NOTE — Assessment & Plan Note (Signed)
 Chronic, stable  -Pt. Requested a Iron panel checked  due to his history of restless leg syndrome.  - Cont follow-up with restless leg specialist.  -Will order labs as requested.

## 2024-04-16 ENCOUNTER — Encounter (INDEPENDENT_AMBULATORY_CARE_PROVIDER_SITE_OTHER): Payer: Self-pay | Admitting: Otolaryngology

## 2024-04-16 ENCOUNTER — Ambulatory Visit (INDEPENDENT_AMBULATORY_CARE_PROVIDER_SITE_OTHER): Admitting: Otolaryngology

## 2024-04-16 VITALS — BP 108/62 | HR 69 | Ht 66.0 in | Wt 165.0 lb

## 2024-04-16 DIAGNOSIS — J310 Chronic rhinitis: Secondary | ICD-10-CM

## 2024-04-16 DIAGNOSIS — J343 Hypertrophy of nasal turbinates: Secondary | ICD-10-CM

## 2024-04-16 DIAGNOSIS — J342 Deviated nasal septum: Secondary | ICD-10-CM

## 2024-04-16 NOTE — Progress Notes (Signed)
 Patient ID: Grant [name], male   DOB: 1955-07-28, 69 y.o.   MRN: [id number]  Follow up: Chronic nasal congestion, chronic rhinosinusitis  History of Present Illness Grant [name] is a 69 year old male with chronic rhinosinusitis, septal deviation, and bilateral inferior turbinate hypertrophy who presents for routine otolaryngology follow-up.  Over the past six months, he has experienced mild, intermittent nasal congestion, occurring occasionally but not frequently. He denies recent sinus infections, facial pain, or facial tenderness. He reports generally good nasal breathing and is satisfied with his current symptom control.  He continues to use intranasal fluticasone , which he believes has reduced his nasal congestion. He has not experienced epistaxis.  He has not required additional interventions or antibiotics for sinonasal symptoms since his last visit. He attributes occasional congestion to seasonal changes and has not noted any significant worsening of symptoms.  Exam: General: Communicates without difficulty, well nourished, no acute distress. Head: Normocephalic, no evidence injury, no tenderness, facial buttresses intact without stepoff. Face/sinus: No tenderness to palpation and percussion. Facial movement is normal and symmetric. Eyes: PERRL, EOMI. No scleral icterus, conjunctivae clear. Neuro: CN II exam reveals vision grossly intact.  No nystagmus at any point of gaze. Ears: Auricles well formed without lesions.  Ear canals are intact without mass or lesion.  No erythema or edema is appreciated.  The TMs are intact without fluid. Nose: External evaluation reveals normal support and skin without lesions.  Dorsum is intact.  Anterior rhinoscopy reveals congested mucosa over anterior aspect of inferior turbinates and deviated septum.  No purulence noted. Oral:  Oral cavity and oropharynx are intact, symmetric, without erythema or edema.  Mucosa is moist without lesions. Neck: Full range of  motion without pain.  There is no significant lymphadenopathy.  No masses palpable.  Thyroid bed within normal limits to palpation.  Parotid glands and submandibular glands equal bilaterally without mass.  Trachea is midline. Neuro:  CN 2-12 grossly intact.    Assessment and Plan Assessment & Plan Chronic rhinosinusitis with nasal septal deviation and bilateral inferior turbinate hypertrophy Chronic rhinosinusitis with nasal septal deviation and bilateral inferior turbinate hypertrophy is well-controlled with intranasal corticosteroid therapy. He reports only mild, intermittent congestion without recent infections. Examination reveals mild congestion, likely attributable to seasonal variation. No evidence of acute exacerbation or indication for surgical intervention. - Continue Flonase  2 sprays intranasally daily, instructing to direct spray away from the septum to reduce risk of epistaxis. - Provided anticipatory guidance regarding seasonal congestion and the role of intranasal corticosteroids in reducing susceptibility to pollens. - Scheduled follow-up in six months to reassess symptoms and examination findings.

## 2024-04-21 ENCOUNTER — Other Ambulatory Visit

## 2024-04-23 ENCOUNTER — Ambulatory Visit: Admitting: Internal Medicine

## 2024-05-03 ENCOUNTER — Ambulatory Visit: Admitting: Pharmacist

## 2024-07-28 ENCOUNTER — Ambulatory Visit: Admitting: Family Medicine

## 2024-10-07 ENCOUNTER — Ambulatory Visit: Admitting: Nurse Practitioner

## 2024-10-15 ENCOUNTER — Ambulatory Visit (INDEPENDENT_AMBULATORY_CARE_PROVIDER_SITE_OTHER): Admitting: Otolaryngology

## 2025-04-01 ENCOUNTER — Ambulatory Visit
